# Patient Record
Sex: Female | Born: 1960 | Race: White | Hispanic: No | Marital: Married | Smoking: Current every day smoker
Health system: Southern US, Community
[De-identification: ages and names within clinical notes are randomized; demographics above are authoritative.]

## PROBLEM LIST (undated history)

## (undated) DIAGNOSIS — G629 Polyneuropathy, unspecified: Secondary | ICD-10-CM

## (undated) DIAGNOSIS — Z807 Family history of other malignant neoplasms of lymphoid, hematopoietic and related tissues: Secondary | ICD-10-CM

## (undated) DIAGNOSIS — I341 Nonrheumatic mitral (valve) prolapse: Secondary | ICD-10-CM

## (undated) DIAGNOSIS — F32A Depression, unspecified: Secondary | ICD-10-CM

## (undated) DIAGNOSIS — F419 Anxiety disorder, unspecified: Secondary | ICD-10-CM

## (undated) DIAGNOSIS — F329 Major depressive disorder, single episode, unspecified: Secondary | ICD-10-CM

## (undated) DIAGNOSIS — R569 Unspecified convulsions: Secondary | ICD-10-CM

## (undated) DIAGNOSIS — E782 Mixed hyperlipidemia: Secondary | ICD-10-CM

## (undated) DIAGNOSIS — M1990 Unspecified osteoarthritis, unspecified site: Secondary | ICD-10-CM

## (undated) DIAGNOSIS — I10 Essential (primary) hypertension: Secondary | ICD-10-CM

## (undated) DIAGNOSIS — Z808 Family history of malignant neoplasm of other organs or systems: Secondary | ICD-10-CM

## (undated) DIAGNOSIS — Z801 Family history of malignant neoplasm of trachea, bronchus and lung: Secondary | ICD-10-CM

## (undated) DIAGNOSIS — Z800 Family history of malignant neoplasm of digestive organs: Secondary | ICD-10-CM

## (undated) DIAGNOSIS — H18519 Endothelial corneal dystrophy, unspecified eye: Secondary | ICD-10-CM

## (undated) DIAGNOSIS — K219 Gastro-esophageal reflux disease without esophagitis: Secondary | ICD-10-CM

## (undated) DIAGNOSIS — R159 Full incontinence of feces: Secondary | ICD-10-CM

## (undated) DIAGNOSIS — C50919 Malignant neoplasm of unspecified site of unspecified female breast: Secondary | ICD-10-CM

## (undated) DIAGNOSIS — H1851 Endothelial corneal dystrophy: Secondary | ICD-10-CM

## (undated) DIAGNOSIS — A0472 Enterocolitis due to Clostridium difficile, not specified as recurrent: Secondary | ICD-10-CM

## (undated) DIAGNOSIS — G4733 Obstructive sleep apnea (adult) (pediatric): Secondary | ICD-10-CM

## (undated) HISTORY — DX: Obstructive sleep apnea (adult) (pediatric): G47.33

## (undated) HISTORY — DX: Endothelial corneal dystrophy: H18.51

## (undated) HISTORY — PX: KNEE ARTHROSCOPY: SUR90

## (undated) HISTORY — DX: Mixed hyperlipidemia: E78.2

## (undated) HISTORY — DX: Essential (primary) hypertension: I10

## (undated) HISTORY — DX: Family history of malignant neoplasm of trachea, bronchus and lung: Z80.1

## (undated) HISTORY — DX: Malignant neoplasm of unspecified site of unspecified female breast: C50.919

## (undated) HISTORY — PX: OTHER SURGICAL HISTORY: SHX169

## (undated) HISTORY — PX: CARDIAC CATHETERIZATION: SHX172

## (undated) HISTORY — DX: Endothelial corneal dystrophy, unspecified eye: H18.519

## (undated) HISTORY — DX: Family history of malignant neoplasm of other organs or systems: Z80.8

## (undated) HISTORY — DX: Family history of malignant neoplasm of digestive organs: Z80.0

## (undated) HISTORY — DX: Enterocolitis due to Clostridium difficile, not specified as recurrent: A04.72

## (undated) HISTORY — DX: Gastro-esophageal reflux disease without esophagitis: K21.9

## (undated) HISTORY — PX: MASTECTOMY: SHX3

## (undated) HISTORY — DX: Polyneuropathy, unspecified: G62.9

## (undated) HISTORY — DX: Family history of other malignant neoplasms of lymphoid, hematopoietic and related tissues: Z80.7

## (undated) HISTORY — DX: Depression, unspecified: F32.A

## (undated) HISTORY — DX: Major depressive disorder, single episode, unspecified: F32.9

## (undated) HISTORY — PX: TONSILLECTOMY: SUR1361

## (undated) HISTORY — DX: Full incontinence of feces: R15.9

## (undated) HISTORY — PX: OVARIAN CYST REMOVAL: SHX89

## (undated) HISTORY — PX: BACK SURGERY: SHX140

## (undated) HISTORY — DX: Nonrheumatic mitral (valve) prolapse: I34.1

## (undated) HISTORY — PX: ELBOW SURGERY: SHX618

---

## 1990-12-17 HISTORY — PX: OTHER SURGICAL HISTORY: SHX169

## 1995-12-18 HISTORY — PX: OTHER SURGICAL HISTORY: SHX169

## 1997-12-17 HISTORY — PX: ABDOMINAL HYSTERECTOMY: SHX81

## 1998-04-05 ENCOUNTER — Ambulatory Visit (HOSPITAL_COMMUNITY): Admission: RE | Admit: 1998-04-05 | Discharge: 1998-04-05 | Payer: Self-pay | Admitting: Obstetrics and Gynecology

## 1998-11-28 ENCOUNTER — Inpatient Hospital Stay (HOSPITAL_COMMUNITY): Admission: RE | Admit: 1998-11-28 | Discharge: 1998-11-30 | Payer: Self-pay | Admitting: Obstetrics and Gynecology

## 1999-05-02 ENCOUNTER — Other Ambulatory Visit: Admission: RE | Admit: 1999-05-02 | Discharge: 1999-05-02 | Payer: Self-pay | Admitting: Obstetrics and Gynecology

## 1999-11-23 ENCOUNTER — Ambulatory Visit (HOSPITAL_COMMUNITY)
Admission: RE | Admit: 1999-11-23 | Discharge: 1999-11-23 | Payer: Self-pay | Admitting: Physical Medicine and Rehabilitation

## 1999-11-23 ENCOUNTER — Encounter: Payer: Self-pay | Admitting: Physical Medicine and Rehabilitation

## 1999-12-15 ENCOUNTER — Encounter: Payer: Self-pay | Admitting: Physical Medicine and Rehabilitation

## 1999-12-15 ENCOUNTER — Ambulatory Visit (HOSPITAL_COMMUNITY)
Admission: RE | Admit: 1999-12-15 | Discharge: 1999-12-15 | Payer: Self-pay | Admitting: Physical Medicine and Rehabilitation

## 1999-12-29 ENCOUNTER — Ambulatory Visit (HOSPITAL_COMMUNITY)
Admission: RE | Admit: 1999-12-29 | Discharge: 1999-12-29 | Payer: Self-pay | Admitting: Physical Medicine and Rehabilitation

## 1999-12-29 ENCOUNTER — Encounter: Payer: Self-pay | Admitting: Physical Medicine and Rehabilitation

## 2000-02-23 ENCOUNTER — Ambulatory Visit (HOSPITAL_COMMUNITY): Admission: RE | Admit: 2000-02-23 | Discharge: 2000-02-23 | Payer: Self-pay | Admitting: Neurosurgery

## 2000-02-23 ENCOUNTER — Encounter: Payer: Self-pay | Admitting: Neurosurgery

## 2000-03-15 ENCOUNTER — Ambulatory Visit (HOSPITAL_COMMUNITY): Admission: RE | Admit: 2000-03-15 | Discharge: 2000-03-15 | Payer: Self-pay | Admitting: Neurosurgery

## 2000-03-15 ENCOUNTER — Encounter: Payer: Self-pay | Admitting: Neurosurgery

## 2000-05-02 ENCOUNTER — Other Ambulatory Visit: Admission: RE | Admit: 2000-05-02 | Discharge: 2000-05-02 | Payer: Self-pay | Admitting: Obstetrics and Gynecology

## 2000-05-24 ENCOUNTER — Ambulatory Visit (HOSPITAL_COMMUNITY): Admission: RE | Admit: 2000-05-24 | Discharge: 2000-05-24 | Payer: Self-pay | Admitting: Neurosurgery

## 2000-05-24 ENCOUNTER — Encounter: Payer: Self-pay | Admitting: Neurosurgery

## 2000-05-28 ENCOUNTER — Encounter: Payer: Self-pay | Admitting: Neurosurgery

## 2000-05-28 ENCOUNTER — Ambulatory Visit (HOSPITAL_COMMUNITY): Admission: RE | Admit: 2000-05-28 | Discharge: 2000-05-28 | Payer: Self-pay | Admitting: Neurosurgery

## 2000-07-17 ENCOUNTER — Encounter: Admission: RE | Admit: 2000-07-17 | Discharge: 2000-07-17 | Payer: Self-pay | Admitting: Obstetrics and Gynecology

## 2000-07-17 ENCOUNTER — Encounter: Payer: Self-pay | Admitting: Obstetrics and Gynecology

## 2000-10-25 ENCOUNTER — Ambulatory Visit (HOSPITAL_COMMUNITY): Admission: RE | Admit: 2000-10-25 | Discharge: 2000-10-25 | Payer: Self-pay | Admitting: Obstetrics and Gynecology

## 2001-09-22 ENCOUNTER — Other Ambulatory Visit: Admission: RE | Admit: 2001-09-22 | Discharge: 2001-09-22 | Payer: Self-pay | Admitting: Obstetrics and Gynecology

## 2001-09-24 ENCOUNTER — Encounter: Payer: Self-pay | Admitting: Obstetrics and Gynecology

## 2001-09-24 ENCOUNTER — Encounter: Admission: RE | Admit: 2001-09-24 | Discharge: 2001-09-24 | Payer: Self-pay | Admitting: Obstetrics and Gynecology

## 2002-07-22 ENCOUNTER — Encounter: Payer: Self-pay | Admitting: Internal Medicine

## 2002-07-22 ENCOUNTER — Inpatient Hospital Stay (HOSPITAL_COMMUNITY): Admission: EM | Admit: 2002-07-22 | Discharge: 2002-07-23 | Payer: Self-pay | Admitting: Internal Medicine

## 2002-09-29 ENCOUNTER — Other Ambulatory Visit: Admission: RE | Admit: 2002-09-29 | Discharge: 2002-09-29 | Payer: Self-pay | Admitting: Obstetrics and Gynecology

## 2002-10-16 ENCOUNTER — Other Ambulatory Visit: Admission: RE | Admit: 2002-10-16 | Discharge: 2002-10-16 | Payer: Self-pay | Admitting: Diagnostic Radiology

## 2002-10-16 ENCOUNTER — Encounter: Payer: Self-pay | Admitting: Obstetrics and Gynecology

## 2002-10-16 ENCOUNTER — Encounter: Admission: RE | Admit: 2002-10-16 | Discharge: 2002-10-16 | Payer: Self-pay | Admitting: Obstetrics and Gynecology

## 2002-10-16 ENCOUNTER — Encounter (INDEPENDENT_AMBULATORY_CARE_PROVIDER_SITE_OTHER): Payer: Self-pay | Admitting: Specialist

## 2002-10-22 ENCOUNTER — Encounter: Payer: Self-pay | Admitting: General Surgery

## 2002-10-22 ENCOUNTER — Ambulatory Visit (HOSPITAL_COMMUNITY): Admission: RE | Admit: 2002-10-22 | Discharge: 2002-10-22 | Payer: Self-pay | Admitting: General Surgery

## 2002-10-28 ENCOUNTER — Encounter: Payer: Self-pay | Admitting: General Surgery

## 2002-10-28 ENCOUNTER — Encounter: Admission: RE | Admit: 2002-10-28 | Discharge: 2002-10-28 | Payer: Self-pay | Admitting: General Surgery

## 2002-10-30 ENCOUNTER — Encounter (INDEPENDENT_AMBULATORY_CARE_PROVIDER_SITE_OTHER): Payer: Self-pay | Admitting: *Deleted

## 2002-10-30 ENCOUNTER — Ambulatory Visit (HOSPITAL_BASED_OUTPATIENT_CLINIC_OR_DEPARTMENT_OTHER): Admission: RE | Admit: 2002-10-30 | Discharge: 2002-10-30 | Payer: Self-pay | Admitting: General Surgery

## 2002-10-30 ENCOUNTER — Encounter: Payer: Self-pay | Admitting: General Surgery

## 2002-10-30 HISTORY — PX: SENTINEL LYMPH NODE BIOPSY: SHX2392

## 2002-10-30 HISTORY — PX: BREAST LUMPECTOMY: SHX2

## 2002-11-05 ENCOUNTER — Ambulatory Visit: Admission: RE | Admit: 2002-11-05 | Discharge: 2002-11-23 | Payer: Self-pay | Admitting: Radiation Oncology

## 2002-11-10 ENCOUNTER — Ambulatory Visit (HOSPITAL_COMMUNITY): Admission: RE | Admit: 2002-11-10 | Discharge: 2002-11-10 | Payer: Self-pay | Admitting: Oncology

## 2002-11-10 ENCOUNTER — Encounter: Payer: Self-pay | Admitting: Oncology

## 2002-11-13 ENCOUNTER — Encounter: Payer: Self-pay | Admitting: Cardiology

## 2002-11-13 ENCOUNTER — Ambulatory Visit: Admission: RE | Admit: 2002-11-13 | Discharge: 2002-11-13 | Payer: Self-pay | Admitting: Oncology

## 2002-11-18 ENCOUNTER — Encounter: Payer: Self-pay | Admitting: Oncology

## 2002-11-18 ENCOUNTER — Ambulatory Visit (HOSPITAL_COMMUNITY): Admission: RE | Admit: 2002-11-18 | Discharge: 2002-11-18 | Payer: Self-pay | Admitting: Oncology

## 2002-12-15 ENCOUNTER — Encounter: Payer: Self-pay | Admitting: General Surgery

## 2002-12-15 ENCOUNTER — Ambulatory Visit (HOSPITAL_BASED_OUTPATIENT_CLINIC_OR_DEPARTMENT_OTHER): Admission: RE | Admit: 2002-12-15 | Discharge: 2002-12-15 | Payer: Self-pay | Admitting: General Surgery

## 2003-02-22 ENCOUNTER — Ambulatory Visit: Admission: RE | Admit: 2003-02-22 | Discharge: 2003-02-22 | Payer: Self-pay | Admitting: Oncology

## 2003-04-13 ENCOUNTER — Encounter (INDEPENDENT_AMBULATORY_CARE_PROVIDER_SITE_OTHER): Payer: Self-pay | Admitting: Cardiology

## 2003-04-13 ENCOUNTER — Ambulatory Visit: Admission: RE | Admit: 2003-04-13 | Discharge: 2003-04-13 | Payer: Self-pay | Admitting: Oncology

## 2003-05-14 ENCOUNTER — Ambulatory Visit: Admission: RE | Admit: 2003-05-14 | Discharge: 2003-07-22 | Payer: Self-pay | Admitting: Radiation Oncology

## 2003-05-19 ENCOUNTER — Encounter: Admission: RE | Admit: 2003-05-19 | Discharge: 2003-05-19 | Payer: Self-pay | Admitting: Radiation Oncology

## 2003-05-25 ENCOUNTER — Encounter (INDEPENDENT_AMBULATORY_CARE_PROVIDER_SITE_OTHER): Payer: Self-pay | Admitting: Cardiology

## 2003-05-25 ENCOUNTER — Ambulatory Visit: Admission: RE | Admit: 2003-05-25 | Discharge: 2003-05-25 | Payer: Self-pay | Admitting: Oncology

## 2003-06-09 ENCOUNTER — Encounter: Payer: Self-pay | Admitting: Oncology

## 2003-06-09 ENCOUNTER — Ambulatory Visit (HOSPITAL_COMMUNITY): Admission: RE | Admit: 2003-06-09 | Discharge: 2003-06-09 | Payer: Self-pay | Admitting: Oncology

## 2003-08-26 ENCOUNTER — Ambulatory Visit: Admission: RE | Admit: 2003-08-26 | Discharge: 2003-08-26 | Payer: Self-pay | Admitting: Oncology

## 2003-08-26 ENCOUNTER — Encounter: Payer: Self-pay | Admitting: Cardiology

## 2003-10-05 ENCOUNTER — Other Ambulatory Visit: Admission: RE | Admit: 2003-10-05 | Discharge: 2003-10-05 | Payer: Self-pay | Admitting: Obstetrics and Gynecology

## 2003-12-13 ENCOUNTER — Encounter: Admission: RE | Admit: 2003-12-13 | Discharge: 2003-12-13 | Payer: Self-pay | Admitting: Oncology

## 2004-02-10 ENCOUNTER — Ambulatory Visit: Admission: RE | Admit: 2004-02-10 | Discharge: 2004-02-10 | Payer: Self-pay | Admitting: Radiation Oncology

## 2004-02-11 ENCOUNTER — Ambulatory Visit (HOSPITAL_COMMUNITY): Admission: RE | Admit: 2004-02-11 | Discharge: 2004-02-11 | Payer: Self-pay | Admitting: Oncology

## 2004-02-15 ENCOUNTER — Ambulatory Visit (HOSPITAL_COMMUNITY): Admission: RE | Admit: 2004-02-15 | Discharge: 2004-02-15 | Payer: Self-pay | Admitting: Oncology

## 2004-03-14 ENCOUNTER — Ambulatory Visit (HOSPITAL_COMMUNITY): Admission: RE | Admit: 2004-03-14 | Discharge: 2004-03-14 | Payer: Self-pay | Admitting: Oncology

## 2004-03-31 ENCOUNTER — Ambulatory Visit (HOSPITAL_BASED_OUTPATIENT_CLINIC_OR_DEPARTMENT_OTHER): Admission: RE | Admit: 2004-03-31 | Discharge: 2004-03-31 | Payer: Self-pay | Admitting: General Surgery

## 2004-05-22 ENCOUNTER — Encounter: Admission: RE | Admit: 2004-05-22 | Discharge: 2004-05-22 | Payer: Self-pay | Admitting: Oncology

## 2004-05-26 ENCOUNTER — Ambulatory Visit: Admission: RE | Admit: 2004-05-26 | Discharge: 2004-05-26 | Payer: Self-pay | Admitting: Oncology

## 2004-05-26 ENCOUNTER — Encounter: Payer: Self-pay | Admitting: Cardiology

## 2004-12-05 ENCOUNTER — Ambulatory Visit: Payer: Self-pay | Admitting: Oncology

## 2004-12-06 ENCOUNTER — Encounter: Admission: RE | Admit: 2004-12-06 | Discharge: 2004-12-06 | Payer: Self-pay | Admitting: Oncology

## 2005-02-27 ENCOUNTER — Ambulatory Visit: Payer: Self-pay | Admitting: Oncology

## 2005-05-23 ENCOUNTER — Ambulatory Visit: Payer: Self-pay | Admitting: Oncology

## 2005-06-29 ENCOUNTER — Ambulatory Visit: Payer: Self-pay | Admitting: Internal Medicine

## 2005-11-30 ENCOUNTER — Ambulatory Visit: Payer: Self-pay | Admitting: Oncology

## 2005-12-26 ENCOUNTER — Ambulatory Visit: Payer: Self-pay | Admitting: Oncology

## 2005-12-26 ENCOUNTER — Encounter: Admission: RE | Admit: 2005-12-26 | Discharge: 2005-12-26 | Payer: Self-pay | Admitting: Oncology

## 2006-05-29 ENCOUNTER — Ambulatory Visit: Admission: RE | Admit: 2006-05-29 | Discharge: 2006-05-29 | Payer: Self-pay | Admitting: Oncology

## 2006-05-29 ENCOUNTER — Encounter: Payer: Self-pay | Admitting: Cardiology

## 2006-05-29 ENCOUNTER — Ambulatory Visit: Payer: Self-pay | Admitting: Cardiology

## 2006-05-30 ENCOUNTER — Ambulatory Visit: Payer: Self-pay | Admitting: Oncology

## 2006-06-03 LAB — CBC WITH DIFFERENTIAL/PLATELET
BASO%: 0.6 % (ref 0.0–2.0)
Basophils Absolute: 0.1 10*3/uL (ref 0.0–0.1)
EOS%: 2.4 % (ref 0.0–7.0)
Eosinophils Absolute: 0.2 10*3/uL (ref 0.0–0.5)
HCT: 39.2 % (ref 34.8–46.6)
HGB: 13.6 g/dL (ref 11.6–15.9)
LYMPH%: 45.8 % (ref 14.0–48.0)
MCH: 31.1 pg (ref 26.0–34.0)
MCHC: 34.8 g/dL (ref 32.0–36.0)
MCV: 89.5 fL (ref 81.0–101.0)
MONO#: 0.5 10*3/uL (ref 0.1–0.9)
MONO%: 5.1 % (ref 0.0–13.0)
NEUT#: 4.1 10*3/uL (ref 1.5–6.5)
NEUT%: 46.1 % (ref 39.6–76.8)
Platelets: 305 10*3/uL (ref 145–400)
RBC: 4.38 10*6/uL (ref 3.70–5.32)
RDW: 12.5 % (ref 11.3–14.5)
WBC: 8.9 10*3/uL (ref 3.9–10.0)
lymph#: 4.1 10*3/uL — ABNORMAL HIGH (ref 0.9–3.3)

## 2006-06-03 LAB — LACTATE DEHYDROGENASE: LDH: 180 U/L (ref 94–250)

## 2006-06-03 LAB — COMPREHENSIVE METABOLIC PANEL
ALT: 13 U/L (ref 0–40)
AST: 31 U/L (ref 0–37)
Albumin: 4.8 g/dL (ref 3.5–5.2)
Alkaline Phosphatase: 72 U/L (ref 39–117)
BUN: 14 mg/dL (ref 6–23)
CO2: 25 mEq/L (ref 19–32)
Calcium: 9.4 mg/dL (ref 8.4–10.5)
Chloride: 103 mEq/L (ref 96–112)
Creatinine, Ser: 0.91 mg/dL (ref 0.40–1.20)
Glucose, Bld: 114 mg/dL — ABNORMAL HIGH (ref 70–99)
Potassium: 3.8 mEq/L (ref 3.5–5.3)
Sodium: 141 mEq/L (ref 135–145)
Total Bilirubin: 0.7 mg/dL (ref 0.3–1.2)
Total Protein: 7.1 g/dL (ref 6.0–8.3)

## 2006-06-03 LAB — CANCER ANTIGEN 27.29: CA 27.29: 27 U/mL (ref 0–39)

## 2006-06-03 LAB — RESEARCH LABS

## 2006-11-21 ENCOUNTER — Ambulatory Visit: Payer: Self-pay | Admitting: Oncology

## 2006-11-25 LAB — CBC WITH DIFFERENTIAL/PLATELET
BASO%: 0.7 % (ref 0.0–2.0)
Basophils Absolute: 0.1 10*3/uL (ref 0.0–0.1)
EOS%: 2.9 % (ref 0.0–7.0)
Eosinophils Absolute: 0.2 10*3/uL (ref 0.0–0.5)
HCT: 38.3 % (ref 34.8–46.6)
HGB: 13.3 g/dL (ref 11.6–15.9)
LYMPH%: 39.3 % (ref 14.0–48.0)
MCH: 31.1 pg (ref 26.0–34.0)
MCHC: 34.7 g/dL (ref 32.0–36.0)
MCV: 89.7 fL (ref 81.0–101.0)
MONO#: 0.4 10*3/uL (ref 0.1–0.9)
MONO%: 4.6 % (ref 0.0–13.0)
NEUT#: 4.1 10*3/uL (ref 1.5–6.5)
NEUT%: 52.5 % (ref 39.6–76.8)
Platelets: 324 10*3/uL (ref 145–400)
RBC: 4.28 10*6/uL (ref 3.70–5.32)
RDW: 12.5 % (ref 11.3–14.5)
WBC: 7.8 10*3/uL (ref 3.9–10.0)
lymph#: 3 10*3/uL (ref 0.9–3.3)

## 2006-11-25 LAB — COMPREHENSIVE METABOLIC PANEL
ALT: 12 U/L (ref 0–35)
AST: 18 U/L (ref 0–37)
Albumin: 4.4 g/dL (ref 3.5–5.2)
Alkaline Phosphatase: 72 U/L (ref 39–117)
BUN: 18 mg/dL (ref 6–23)
CO2: 27 mEq/L (ref 19–32)
Calcium: 9.5 mg/dL (ref 8.4–10.5)
Chloride: 105 mEq/L (ref 96–112)
Creatinine, Ser: 0.76 mg/dL (ref 0.40–1.20)
Glucose, Bld: 124 mg/dL — ABNORMAL HIGH (ref 70–99)
Potassium: 3.8 mEq/L (ref 3.5–5.3)
Sodium: 141 mEq/L (ref 135–145)
Total Bilirubin: 0.6 mg/dL (ref 0.3–1.2)
Total Protein: 7.1 g/dL (ref 6.0–8.3)

## 2006-11-25 LAB — LACTATE DEHYDROGENASE: LDH: 143 U/L (ref 94–250)

## 2006-11-25 LAB — CANCER ANTIGEN 27.29: CA 27.29: 22 U/mL (ref 0–39)

## 2006-11-25 LAB — RESEARCH LABS

## 2007-01-10 ENCOUNTER — Encounter: Admission: RE | Admit: 2007-01-10 | Discharge: 2007-01-10 | Payer: Self-pay | Admitting: Oncology

## 2007-04-01 ENCOUNTER — Emergency Department (HOSPITAL_COMMUNITY): Admission: EM | Admit: 2007-04-01 | Discharge: 2007-04-01 | Payer: Self-pay | Admitting: Emergency Medicine

## 2007-04-03 ENCOUNTER — Ambulatory Visit: Payer: Self-pay | Admitting: Orthopedic Surgery

## 2007-04-17 ENCOUNTER — Ambulatory Visit: Payer: Self-pay | Admitting: Orthopedic Surgery

## 2007-04-21 ENCOUNTER — Ambulatory Visit (HOSPITAL_COMMUNITY): Admission: RE | Admit: 2007-04-21 | Discharge: 2007-04-21 | Payer: Self-pay | Admitting: Orthopedic Surgery

## 2007-04-28 ENCOUNTER — Ambulatory Visit: Payer: Self-pay | Admitting: Orthopedic Surgery

## 2007-04-30 ENCOUNTER — Encounter: Payer: Self-pay | Admitting: Orthopedic Surgery

## 2007-05-01 ENCOUNTER — Encounter (HOSPITAL_COMMUNITY): Admission: RE | Admit: 2007-05-01 | Discharge: 2007-05-31 | Payer: Self-pay | Admitting: Orthopedic Surgery

## 2007-05-21 ENCOUNTER — Ambulatory Visit: Payer: Self-pay | Admitting: Oncology

## 2007-06-02 ENCOUNTER — Encounter (HOSPITAL_COMMUNITY): Admission: RE | Admit: 2007-06-02 | Discharge: 2007-07-02 | Payer: Self-pay | Admitting: Orthopedic Surgery

## 2007-06-30 ENCOUNTER — Ambulatory Visit: Payer: Self-pay | Admitting: Cardiovascular Disease

## 2007-06-30 ENCOUNTER — Inpatient Hospital Stay (HOSPITAL_COMMUNITY): Admission: RE | Admit: 2007-06-30 | Discharge: 2007-07-02 | Payer: Self-pay | Admitting: Neurosurgery

## 2007-07-23 ENCOUNTER — Ambulatory Visit: Payer: Self-pay | Admitting: Oncology

## 2007-07-25 LAB — COMPREHENSIVE METABOLIC PANEL
ALT: 9 U/L (ref 0–35)
AST: 16 U/L (ref 0–37)
Albumin: 4 g/dL (ref 3.5–5.2)
Alkaline Phosphatase: 79 U/L (ref 39–117)
BUN: 13 mg/dL (ref 6–23)
CO2: 24 mEq/L (ref 19–32)
Calcium: 9.8 mg/dL (ref 8.4–10.5)
Chloride: 105 mEq/L (ref 96–112)
Creatinine, Ser: 0.81 mg/dL (ref 0.40–1.20)
Glucose, Bld: 78 mg/dL (ref 70–99)
Potassium: 4.4 mEq/L (ref 3.5–5.3)
Sodium: 140 mEq/L (ref 135–145)
Total Bilirubin: 0.6 mg/dL (ref 0.3–1.2)
Total Protein: 7 g/dL (ref 6.0–8.3)

## 2007-07-25 LAB — CBC WITH DIFFERENTIAL/PLATELET
BASO%: 0.5 % (ref 0.0–2.0)
Basophils Absolute: 0 10*3/uL (ref 0.0–0.1)
EOS%: 4.3 % (ref 0.0–7.0)
Eosinophils Absolute: 0.4 10*3/uL (ref 0.0–0.5)
HCT: 36.9 % (ref 34.8–46.6)
HGB: 13.2 g/dL (ref 11.6–15.9)
LYMPH%: 33.6 % (ref 14.0–48.0)
MCH: 31.6 pg (ref 26.0–34.0)
MCHC: 35.7 g/dL (ref 32.0–36.0)
MCV: 88.7 fL (ref 81.0–101.0)
MONO#: 0.7 10*3/uL (ref 0.1–0.9)
MONO%: 7.5 % (ref 0.0–13.0)
NEUT#: 5 10*3/uL (ref 1.5–6.5)
NEUT%: 54.1 % (ref 39.6–76.8)
Platelets: 359 10*3/uL (ref 145–400)
RBC: 4.16 10*6/uL (ref 3.70–5.32)
RDW: 12.3 % (ref 11.3–14.5)
WBC: 9.2 10*3/uL (ref 3.9–10.0)
lymph#: 3.1 10*3/uL (ref 0.9–3.3)

## 2007-07-25 LAB — RESEARCH LABS

## 2007-07-25 LAB — CANCER ANTIGEN 27.29: CA 27.29: 24 U/mL (ref 0–39)

## 2007-07-25 LAB — LACTATE DEHYDROGENASE: LDH: 137 U/L (ref 94–250)

## 2007-10-22 ENCOUNTER — Encounter: Payer: Self-pay | Admitting: Orthopedic Surgery

## 2007-11-27 ENCOUNTER — Ambulatory Visit: Payer: Self-pay | Admitting: Oncology

## 2007-12-30 ENCOUNTER — Encounter: Payer: Self-pay | Admitting: Orthopedic Surgery

## 2008-01-13 ENCOUNTER — Ambulatory Visit: Payer: Self-pay | Admitting: Oncology

## 2008-02-13 ENCOUNTER — Encounter: Admission: RE | Admit: 2008-02-13 | Discharge: 2008-02-13 | Payer: Self-pay | Admitting: Oncology

## 2008-02-13 LAB — CBC WITH DIFFERENTIAL/PLATELET
BASO%: 0.7 % (ref 0.0–2.0)
Basophils Absolute: 0.1 10*3/uL (ref 0.0–0.1)
EOS%: 2 % (ref 0.0–7.0)
Eosinophils Absolute: 0.2 10*3/uL (ref 0.0–0.5)
HCT: 41.3 % (ref 34.8–46.6)
HGB: 14.6 g/dL (ref 11.6–15.9)
LYMPH%: 38.5 % (ref 14.0–48.0)
MCH: 31.1 pg (ref 26.0–34.0)
MCHC: 35.4 g/dL (ref 32.0–36.0)
MCV: 88 fL (ref 81.0–101.0)
MONO#: 0.5 10*3/uL (ref 0.1–0.9)
MONO%: 4.5 % (ref 0.0–13.0)
NEUT#: 5.7 10*3/uL (ref 1.5–6.5)
NEUT%: 54.3 % (ref 39.6–76.8)
Platelets: 311 10*3/uL (ref 145–400)
RBC: 4.69 10*6/uL (ref 3.70–5.32)
RDW: 12.6 % (ref 11.3–14.5)
WBC: 10.4 10*3/uL — ABNORMAL HIGH (ref 3.9–10.0)
lymph#: 4 10*3/uL — ABNORMAL HIGH (ref 0.9–3.3)

## 2008-02-13 LAB — RESEARCH LABS

## 2008-02-16 LAB — COMPREHENSIVE METABOLIC PANEL
ALT: 12 U/L (ref 0–35)
AST: 23 U/L (ref 0–37)
Albumin: 4.9 g/dL (ref 3.5–5.2)
Alkaline Phosphatase: 76 U/L (ref 39–117)
BUN: 19 mg/dL (ref 6–23)
CO2: 23 mEq/L (ref 19–32)
Calcium: 9.6 mg/dL (ref 8.4–10.5)
Chloride: 99 mEq/L (ref 96–112)
Creatinine, Ser: 0.8 mg/dL (ref 0.40–1.20)
Glucose, Bld: 84 mg/dL (ref 70–99)
Potassium: 4 mEq/L (ref 3.5–5.3)
Sodium: 137 mEq/L (ref 135–145)
Total Bilirubin: 0.9 mg/dL (ref 0.3–1.2)
Total Protein: 7.7 g/dL (ref 6.0–8.3)

## 2008-02-16 LAB — LACTATE DEHYDROGENASE: LDH: 163 U/L (ref 94–250)

## 2008-02-16 LAB — VITAMIN D 25 HYDROXY (VIT D DEFICIENCY, FRACTURES): Vit D, 25-Hydroxy: 40 ng/mL (ref 30–89)

## 2008-02-16 LAB — CANCER ANTIGEN 27.29: CA 27.29: 36 U/mL (ref 0–39)

## 2008-02-18 LAB — VITAMIN D 1,25 DIHYDROXY: Vit D, 1,25-Dihydroxy: 24 pg/mL (ref 6–62)

## 2008-04-20 ENCOUNTER — Encounter: Payer: Self-pay | Admitting: Orthopedic Surgery

## 2008-06-02 ENCOUNTER — Ambulatory Visit: Payer: Self-pay | Admitting: Oncology

## 2008-06-04 ENCOUNTER — Ambulatory Visit: Payer: Self-pay

## 2008-06-04 ENCOUNTER — Encounter: Payer: Self-pay | Admitting: Oncology

## 2008-06-04 LAB — CBC WITH DIFFERENTIAL/PLATELET
BASO%: 0.8 % (ref 0.0–2.0)
Basophils Absolute: 0.1 10*3/uL (ref 0.0–0.1)
EOS%: 2.7 % (ref 0.0–7.0)
Eosinophils Absolute: 0.2 10*3/uL (ref 0.0–0.5)
HCT: 38.6 % (ref 34.8–46.6)
HGB: 13.5 g/dL (ref 11.6–15.9)
LYMPH%: 37.4 % (ref 14.0–48.0)
MCH: 31.1 pg (ref 26.0–34.0)
MCHC: 35.1 g/dL (ref 32.0–36.0)
MCV: 88.6 fL (ref 81.0–101.0)
MONO#: 0.6 10*3/uL (ref 0.1–0.9)
MONO%: 7.2 % (ref 0.0–13.0)
NEUT#: 4.2 10*3/uL (ref 1.5–6.5)
NEUT%: 51.9 % (ref 39.6–76.8)
Platelets: 323 10*3/uL (ref 145–400)
RBC: 4.36 10*6/uL (ref 3.70–5.32)
RDW: 12.4 % (ref 11.3–14.5)
WBC: 8 10*3/uL (ref 3.9–10.0)
lymph#: 3 10*3/uL (ref 0.9–3.3)

## 2008-06-04 LAB — COMPREHENSIVE METABOLIC PANEL
ALT: 14 U/L (ref 0–35)
AST: 21 U/L (ref 0–37)
Albumin: 4.7 g/dL (ref 3.5–5.2)
Alkaline Phosphatase: 67 U/L (ref 39–117)
BUN: 15 mg/dL (ref 6–23)
CO2: 24 mEq/L (ref 19–32)
Calcium: 9.9 mg/dL (ref 8.4–10.5)
Chloride: 106 mEq/L (ref 96–112)
Creatinine, Ser: 0.83 mg/dL (ref 0.40–1.20)
Glucose, Bld: 100 mg/dL — ABNORMAL HIGH (ref 70–99)
Potassium: 4.4 mEq/L (ref 3.5–5.3)
Sodium: 141 mEq/L (ref 135–145)
Total Bilirubin: 0.7 mg/dL (ref 0.3–1.2)
Total Protein: 7.3 g/dL (ref 6.0–8.3)

## 2008-06-04 LAB — CANCER ANTIGEN 27.29: CA 27.29: 37 U/mL (ref 0–39)

## 2008-06-04 LAB — RESEARCH LABS

## 2008-06-04 LAB — LACTATE DEHYDROGENASE: LDH: 154 U/L (ref 94–250)

## 2008-07-02 ENCOUNTER — Encounter: Payer: Self-pay | Admitting: Orthopedic Surgery

## 2008-07-09 ENCOUNTER — Encounter: Admission: RE | Admit: 2008-07-09 | Discharge: 2008-07-09 | Payer: Self-pay | Admitting: Neurosurgery

## 2008-07-21 ENCOUNTER — Encounter: Payer: Self-pay | Admitting: Orthopedic Surgery

## 2008-08-26 ENCOUNTER — Observation Stay (HOSPITAL_COMMUNITY): Admission: RE | Admit: 2008-08-26 | Discharge: 2008-08-27 | Payer: Self-pay | Admitting: Neurosurgery

## 2008-08-27 ENCOUNTER — Emergency Department (HOSPITAL_COMMUNITY): Admission: EM | Admit: 2008-08-27 | Discharge: 2008-08-28 | Payer: Self-pay | Admitting: Emergency Medicine

## 2008-09-16 ENCOUNTER — Encounter: Payer: Self-pay | Admitting: Orthopedic Surgery

## 2008-11-19 ENCOUNTER — Encounter: Payer: Self-pay | Admitting: Orthopedic Surgery

## 2008-12-02 ENCOUNTER — Ambulatory Visit: Payer: Self-pay | Admitting: Oncology

## 2008-12-06 LAB — CBC WITH DIFFERENTIAL/PLATELET
BASO%: 0.2 % (ref 0.0–2.0)
Basophils Absolute: 0 10*3/uL (ref 0.0–0.1)
EOS%: 2.9 % (ref 0.0–7.0)
Eosinophils Absolute: 0.3 10*3/uL (ref 0.0–0.5)
HCT: 40.7 % (ref 34.8–46.6)
HGB: 14.1 g/dL (ref 11.6–15.9)
LYMPH%: 37.8 % (ref 14.0–48.0)
MCH: 31 pg (ref 26.0–34.0)
MCHC: 34.6 g/dL (ref 32.0–36.0)
MCV: 89.8 fL (ref 81.0–101.0)
MONO#: 0.3 10*3/uL (ref 0.1–0.9)
MONO%: 3.1 % (ref 0.0–13.0)
NEUT#: 5.4 10*3/uL (ref 1.5–6.5)
NEUT%: 56 % (ref 39.6–76.8)
Platelets: 311 10*3/uL (ref 145–400)
RBC: 4.53 10*6/uL (ref 3.70–5.32)
RDW: 13 % (ref 11.3–14.5)
WBC: 9.6 10*3/uL (ref 3.9–10.0)
lymph#: 3.6 10*3/uL — ABNORMAL HIGH (ref 0.9–3.3)

## 2008-12-07 LAB — LACTATE DEHYDROGENASE: LDH: 162 U/L (ref 94–250)

## 2008-12-07 LAB — COMPREHENSIVE METABOLIC PANEL
ALT: 14 U/L (ref 0–35)
AST: 19 U/L (ref 0–37)
Albumin: 4.6 g/dL (ref 3.5–5.2)
Alkaline Phosphatase: 82 U/L (ref 39–117)
BUN: 18 mg/dL (ref 6–23)
CO2: 25 mEq/L (ref 19–32)
Calcium: 9.6 mg/dL (ref 8.4–10.5)
Chloride: 104 mEq/L (ref 96–112)
Creatinine, Ser: 0.73 mg/dL (ref 0.40–1.20)
Glucose, Bld: 120 mg/dL — ABNORMAL HIGH (ref 70–99)
Potassium: 4.4 mEq/L (ref 3.5–5.3)
Sodium: 143 mEq/L (ref 135–145)
Total Bilirubin: 0.6 mg/dL (ref 0.3–1.2)
Total Protein: 7.5 g/dL (ref 6.0–8.3)

## 2008-12-07 LAB — CANCER ANTIGEN 27.29: CA 27.29: 40 U/mL — ABNORMAL HIGH (ref 0–39)

## 2009-01-08 ENCOUNTER — Emergency Department (HOSPITAL_COMMUNITY): Admission: EM | Admit: 2009-01-08 | Discharge: 2009-01-08 | Payer: Self-pay | Admitting: Emergency Medicine

## 2009-01-08 ENCOUNTER — Encounter: Payer: Self-pay | Admitting: Orthopedic Surgery

## 2009-01-20 ENCOUNTER — Ambulatory Visit: Payer: Self-pay | Admitting: Orthopedic Surgery

## 2009-01-20 DIAGNOSIS — M25569 Pain in unspecified knee: Secondary | ICD-10-CM | POA: Insufficient documentation

## 2009-01-20 DIAGNOSIS — M23302 Other meniscus derangements, unspecified lateral meniscus, unspecified knee: Secondary | ICD-10-CM | POA: Insufficient documentation

## 2009-01-20 DIAGNOSIS — M25469 Effusion, unspecified knee: Secondary | ICD-10-CM | POA: Insufficient documentation

## 2009-01-20 DIAGNOSIS — M1710 Unilateral primary osteoarthritis, unspecified knee: Secondary | ICD-10-CM

## 2009-01-20 DIAGNOSIS — IMO0002 Reserved for concepts with insufficient information to code with codable children: Secondary | ICD-10-CM | POA: Insufficient documentation

## 2009-01-28 ENCOUNTER — Telehealth: Payer: Self-pay | Admitting: Orthopedic Surgery

## 2009-02-02 ENCOUNTER — Ambulatory Visit (HOSPITAL_COMMUNITY): Admission: RE | Admit: 2009-02-02 | Discharge: 2009-02-02 | Payer: Self-pay | Admitting: Orthopedic Surgery

## 2009-02-09 ENCOUNTER — Ambulatory Visit: Payer: Self-pay | Admitting: Orthopedic Surgery

## 2009-02-09 ENCOUNTER — Ambulatory Visit: Payer: Self-pay | Admitting: Oncology

## 2009-02-11 LAB — CANCER ANTIGEN 27.29: CA 27.29: 29 U/mL (ref 0–39)

## 2009-02-14 ENCOUNTER — Encounter: Admission: RE | Admit: 2009-02-14 | Discharge: 2009-02-14 | Payer: Self-pay | Admitting: Oncology

## 2009-02-16 ENCOUNTER — Ambulatory Visit (HOSPITAL_COMMUNITY): Admission: RE | Admit: 2009-02-16 | Discharge: 2009-02-16 | Payer: Self-pay | Admitting: Oncology

## 2009-02-17 ENCOUNTER — Encounter: Admission: RE | Admit: 2009-02-17 | Discharge: 2009-02-17 | Payer: Self-pay | Admitting: Oncology

## 2009-02-18 ENCOUNTER — Encounter: Payer: Self-pay | Admitting: Orthopedic Surgery

## 2009-05-13 ENCOUNTER — Ambulatory Visit: Payer: Self-pay | Admitting: Oncology

## 2009-05-18 LAB — CBC WITH DIFFERENTIAL/PLATELET
BASO%: 0.5 % (ref 0.0–2.0)
Basophils Absolute: 0.1 10*3/uL (ref 0.0–0.1)
EOS%: 2.2 % (ref 0.0–7.0)
Eosinophils Absolute: 0.2 10*3/uL (ref 0.0–0.5)
HCT: 40.8 % (ref 34.8–46.6)
HGB: 14.2 g/dL (ref 11.6–15.9)
LYMPH%: 31.2 % (ref 14.0–49.7)
MCH: 31.6 pg (ref 25.1–34.0)
MCHC: 34.9 g/dL (ref 31.5–36.0)
MCV: 90.5 fL (ref 79.5–101.0)
MONO#: 0.5 10*3/uL (ref 0.1–0.9)
MONO%: 5.1 % (ref 0.0–14.0)
NEUT#: 6.4 10*3/uL (ref 1.5–6.5)
NEUT%: 61 % (ref 38.4–76.8)
Platelets: 303 10*3/uL (ref 145–400)
RBC: 4.51 10*6/uL (ref 3.70–5.45)
RDW: 12.8 % (ref 11.2–14.5)
WBC: 10.5 10*3/uL — ABNORMAL HIGH (ref 3.9–10.3)
lymph#: 3.3 10*3/uL (ref 0.9–3.3)

## 2009-05-19 LAB — COMPREHENSIVE METABOLIC PANEL
ALT: 10 U/L (ref 0–35)
AST: 18 U/L (ref 0–37)
Albumin: 4.6 g/dL (ref 3.5–5.2)
Alkaline Phosphatase: 77 U/L (ref 39–117)
BUN: 13 mg/dL (ref 6–23)
CO2: 25 mEq/L (ref 19–32)
Calcium: 10 mg/dL (ref 8.4–10.5)
Chloride: 105 mEq/L (ref 96–112)
Creatinine, Ser: 0.81 mg/dL (ref 0.40–1.20)
Glucose, Bld: 97 mg/dL (ref 70–99)
Potassium: 4.2 mEq/L (ref 3.5–5.3)
Sodium: 140 mEq/L (ref 135–145)
Total Bilirubin: 0.6 mg/dL (ref 0.3–1.2)
Total Protein: 7.7 g/dL (ref 6.0–8.3)

## 2009-05-19 LAB — CANCER ANTIGEN 27.29: CA 27.29: 37 U/mL (ref 0–39)

## 2009-05-19 LAB — VITAMIN D 25 HYDROXY (VIT D DEFICIENCY, FRACTURES): Vit D, 25-Hydroxy: 34 ng/mL (ref 30–89)

## 2009-05-19 LAB — LACTATE DEHYDROGENASE: LDH: 156 U/L (ref 94–250)

## 2009-08-19 ENCOUNTER — Ambulatory Visit (HOSPITAL_COMMUNITY): Admission: RE | Admit: 2009-08-19 | Discharge: 2009-08-19 | Payer: Self-pay | Admitting: Oncology

## 2010-02-15 ENCOUNTER — Encounter: Admission: RE | Admit: 2010-02-15 | Discharge: 2010-02-15 | Payer: Self-pay | Admitting: Oncology

## 2010-05-17 ENCOUNTER — Ambulatory Visit: Payer: Self-pay | Admitting: Oncology

## 2010-05-22 LAB — CBC WITH DIFFERENTIAL/PLATELET
BASO%: 0.5 % (ref 0.0–2.0)
Basophils Absolute: 0 10*3/uL (ref 0.0–0.1)
EOS%: 1.8 % (ref 0.0–7.0)
Eosinophils Absolute: 0.1 10*3/uL (ref 0.0–0.5)
HCT: 39.7 % (ref 34.8–46.6)
HGB: 13.8 g/dL (ref 11.6–15.9)
LYMPH%: 42.1 % (ref 14.0–49.7)
MCH: 31.7 pg (ref 25.1–34.0)
MCHC: 34.8 g/dL (ref 31.5–36.0)
MCV: 90.9 fL (ref 79.5–101.0)
MONO#: 0.4 10*3/uL (ref 0.1–0.9)
MONO%: 4.7 % (ref 0.0–14.0)
NEUT#: 3.8 10*3/uL (ref 1.5–6.5)
NEUT%: 50.9 % (ref 38.4–76.8)
Platelets: 258 10*3/uL (ref 145–400)
RBC: 4.36 10*6/uL (ref 3.70–5.45)
RDW: 12.8 % (ref 11.2–14.5)
WBC: 7.5 10*3/uL (ref 3.9–10.3)
lymph#: 3.1 10*3/uL (ref 0.9–3.3)

## 2010-05-23 LAB — COMPREHENSIVE METABOLIC PANEL
ALT: 12 U/L (ref 0–35)
AST: 20 U/L (ref 0–37)
Albumin: 4.9 g/dL (ref 3.5–5.2)
Alkaline Phosphatase: 74 U/L (ref 39–117)
BUN: 19 mg/dL (ref 6–23)
CO2: 22 mEq/L (ref 19–32)
Calcium: 9.9 mg/dL (ref 8.4–10.5)
Chloride: 105 mEq/L (ref 96–112)
Creatinine, Ser: 0.83 mg/dL (ref 0.40–1.20)
Glucose, Bld: 104 mg/dL — ABNORMAL HIGH (ref 70–99)
Potassium: 4.3 mEq/L (ref 3.5–5.3)
Sodium: 140 mEq/L (ref 135–145)
Total Bilirubin: 0.7 mg/dL (ref 0.3–1.2)
Total Protein: 7.4 g/dL (ref 6.0–8.3)

## 2010-05-23 LAB — LACTATE DEHYDROGENASE: LDH: 151 U/L (ref 94–250)

## 2010-05-23 LAB — CANCER ANTIGEN 27.29: CA 27.29: 30 U/mL (ref 0–39)

## 2010-05-23 LAB — VITAMIN D 25 HYDROXY (VIT D DEFICIENCY, FRACTURES): Vit D, 25-Hydroxy: 45 ng/mL (ref 30–89)

## 2010-07-20 ENCOUNTER — Ambulatory Visit: Payer: Self-pay | Admitting: Orthopedic Surgery

## 2010-07-20 DIAGNOSIS — M7710 Lateral epicondylitis, unspecified elbow: Secondary | ICD-10-CM | POA: Insufficient documentation

## 2010-07-24 ENCOUNTER — Encounter: Payer: Self-pay | Admitting: Orthopedic Surgery

## 2010-07-27 ENCOUNTER — Encounter: Payer: Self-pay | Admitting: Orthopedic Surgery

## 2010-07-27 ENCOUNTER — Encounter (HOSPITAL_COMMUNITY): Admission: RE | Admit: 2010-07-27 | Discharge: 2010-08-26 | Payer: Self-pay | Admitting: Orthopedic Surgery

## 2010-07-31 ENCOUNTER — Encounter
Admission: RE | Admit: 2010-07-31 | Discharge: 2010-09-04 | Payer: Self-pay | Source: Home / Self Care | Admitting: Orthopedic Surgery

## 2010-08-08 ENCOUNTER — Encounter: Payer: Self-pay | Admitting: Orthopedic Surgery

## 2010-09-11 ENCOUNTER — Ambulatory Visit: Payer: Self-pay | Admitting: Orthopedic Surgery

## 2010-09-14 ENCOUNTER — Encounter: Payer: Self-pay | Admitting: Orthopedic Surgery

## 2010-10-25 ENCOUNTER — Ambulatory Visit: Payer: Self-pay | Admitting: Orthopedic Surgery

## 2010-12-27 ENCOUNTER — Ambulatory Visit
Admission: RE | Admit: 2010-12-27 | Discharge: 2010-12-27 | Payer: Self-pay | Source: Home / Self Care | Attending: Orthopedic Surgery | Admitting: Orthopedic Surgery

## 2011-01-06 ENCOUNTER — Other Ambulatory Visit: Payer: Self-pay | Admitting: Oncology

## 2011-01-06 DIAGNOSIS — Z1239 Encounter for other screening for malignant neoplasm of breast: Secondary | ICD-10-CM

## 2011-01-07 ENCOUNTER — Encounter: Payer: Self-pay | Admitting: Oncology

## 2011-01-10 ENCOUNTER — Ambulatory Visit
Admission: RE | Admit: 2011-01-10 | Discharge: 2011-01-10 | Payer: Self-pay | Source: Home / Self Care | Attending: Orthopedic Surgery | Admitting: Orthopedic Surgery

## 2011-01-10 ENCOUNTER — Encounter: Payer: Self-pay | Admitting: Orthopedic Surgery

## 2011-01-15 ENCOUNTER — Telehealth: Payer: Self-pay | Admitting: Orthopedic Surgery

## 2011-01-18 NOTE — Progress Notes (Signed)
Summary: Office Visit 04/30/07  Office Visit 04/30/07   Imported By: Cammie Sickle 01/20/2009 09:06:06  _____________________________________________________________________  External Attachment:    Type:   Image     Comment:   External Document

## 2011-01-18 NOTE — Miscellaneous (Addendum)
Summary: PT Discharge summary  PT Discharage summary   Imported By: Jacklynn Ganong 09/14/2010 16:23:25  _____________________________________________________________________  External Attachment:    Type:   Image     Comment:   External Document

## 2011-01-18 NOTE — Letter (Signed)
Summary: Work [facility]  [address]  [place], [place] [address]   Phone: [phone]  Fax: [phone]    Today's Date: January 10, 2011  Name of Patient: Rachel Sutton [facility]  The above named patient had a medical visit today.  Please take this into consideration when reviewing the time away from work   Special Instructions:     [ X ] To be off  next  for scheduled appointments as follows:         Pre-op  01/16/11   [ X ] Other          (secondary to surgery scheduled 01/22/11)           Estimated return to work date 02/22/11 or until further notice ________________________________________________________________________   Sincerely,   Terrance Mass, MD

## 2011-01-18 NOTE — Consult Note (Signed)
Summary: Consult Report [facility]  Consult Report [facility]   Imported By: Cammie Sickle 07/29/2008 10:09:12  _____________________________________________________________________  External Attachment:    Type:   Image     Comment:   External Document

## 2011-01-18 NOTE — Assessment & Plan Note (Signed)
Summary: AP ER FOL/UP/LT KNEE PAIN,SWELLING/XR AP/BCBS/CAF    History of Present Illness: I saw Rachel Sutton in the office today for a followup visit.  She is a 50 years old woman with the complaint of:  NEW PROBLEM. LEFT KNEE PAIN.  ER on 01-08-09 for left knee pain and swelling. Xrays were negative for fracture, small effusion.  Patient states no injury. She says that her knee starting hurting that morning. She says that she has had swelling on and off for 6 months. She has been doing the Wii fit.  musculoskeletal review of systems otherwise negative    Updated Prior Medication List: No Medications Current Allergies (reviewed today): ! * [facility] ! HALDOL  Past Medical History:    Reviewed history from 01/20/2009 and no changes required:       None  Past Surgical History:    Lumpectomy    Carpal Tunnel R/L    Ray Cage fusion    a cervical spine fusion x2 since last visit   Family History:    noncontributory  Social History:    Patient is married.     Review of Systems  The review of systems is negative for General, Cardiac , Resp, and GI.   Physical Exam  Constitutional: vital signs see recorded values. General: normal development, nutrition, and grooming. No deformity. Body Habitus is medium. CDV: Observation and palpation was normal  Lymph: palpation of the lymph nodes were normal Skin: inspection and palpation of the skin revealed no abnormalities  Neuro: coordination: normal              DTR's deferred             Sensation was normal  Psyche: Alert and oriented x 3. Mood was normal.  Affect: normal  MSK: Gait: abnormal gait pattern using crutches partial and nonweightbearing  LEFT knee large joint effusion.  0-115 range of motion, alignment normal, motor exam normal, ligament exam normal, medial joint line tenderness.  I cannot perform the McMurray's maneuver and a normal reliable manner due to  pain and swelling  RIGHT knee examination revealed normal  range of motion strength stability and alignment   The upper extremities have normal appearance, ROM, strength and stability.      Impression & Recommendations:  Problem # 1:  KNEE PAIN (LKG-401.02) Assessment: Deteriorated The x-rays were done at [facility]. The report and the films have been reviewed. Mild arthritis in the knee  Aspiration injection LEFT knee 70 cc of clear yellow fluid aspirated LEFT knee Verbal consent was obtained. The knee was prepped with alcohol and ethyl chloride. 1 cc of depomedrol 40mg /cc and 4 cc of lidocaine 1% was injected. there were no complications.  Orders: Est. Patient Level III (72536) Depo- Medrol 40mg  (J1030) Joint Aspirate / Injection, Large (20610)   Problem # 2:  JOINT EFFUSION, LEFT KNEE (ICD-719.06) Assessment: Deteriorated  Orders: Est. Patient Level III (64403) Depo- Medrol 40mg  (J1030) Joint Aspirate / Injection, Large (20610)   Problem # 3:  KNEE, ARTHRITIS, DEGEN./OSTEO (ICD-715.96) Assessment: Deteriorated  Orders: Est. Patient Level III (47425)   Problem # 4:  DERANGEMENT MENISCUS (ICD-717.5) Assessment: Deteriorated  Orders: Est. Patient Level III (95638)    Patient Instructions: 1)  MRI left knee  2)  You have received an injection of cortisone today. You may experience increased pain at the injection site. Apply ice pack to the area for 20 minutes every 2 hours and take 2 xtra strength tylenol every 8 hours.  This increased pain will usually resolve in 24 hours. The injection will take effect in 3-10 days.  3)  Ice 3 x a day for the next 5 days  4)  Take pain medication and aleve daily

## 2011-01-18 NOTE — Letter (Signed)
Summary: History form  History form   Imported By: Jacklynn Ganong 07/25/2010 16:36:45  _____________________________________________________________________  External Attachment:    Type:   Image     Comment:   External Document

## 2011-01-18 NOTE — Assessment & Plan Note (Signed)
Summary: 6 we reck rt elbow/amer heritage/bsf   Visit Type:  Follow-up  CC:  right elbow pain.  History of Present Illness: NW:GNFAO elbow.  Treatment, physical therapy, diclofenac 50 mg twice a day, tennis elbow brace; injection x 2   Medications as stated.  The patient complained of elbow, not any better since last visit, although she did make noticeable improvement 1st or 2nd visit.  She, says she's fine as long as she keeps her tennis elbow brace on.             Allergies: 1)  ! * Demoral 2)  ! Haldol   Shoulder/Elbow Exam  General:    Well-developed, well-nourished, normal body habitus; no deformities, normal grooming.    Skin:    Intact, no scars, lesions, rashes, cafe au lait spots or bruising.    Inspection:    Inspection is normal.    Palpation:    she is still tender over the lateral upper condyle, especially over the radial head articulation with the with the capitellum  Vascular:    Radial, ulnar, brachial, and axillary pulses 2+ and symmetric; capillary refill less than 2 seconds; no evidence of ischemia, clubbing, or cyanosis.    Sensory:    Gross sensation intact in the upper extremities.    Motor:    strength in the RIGHT elbow. Muscle seems to be normal.    Elbow Exam:    Right:    Inspection/Palpation:  she does have a slight loss of extension grab 5 or less. She does have some pain with pronation and supination.  She does have pain with wrist extension-elbow extended.   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  Patient will continue current program. Follow up 8 weeks.  Orders: Est. Patient Level III (13086)  Patient Instructions: 1)  Continue brace, meds and exercises. 2)  Come back in 8 weeks Prescriptions: DICLOFENAC POTASSIUM 50 MG TABS (DICLOFENAC POTASSIUM) 1 by mouth two times a day   #60 x 1   Entered and Authorized by:   Fuller Canada MD   Signed by:   Fuller Canada MD on 10/25/2010   Method used:   Faxed to ...       [facility] (retail)       [address]       [place], [place]  [address]       Ph: [phone]       Fax: [phone]   RxID:   [id number]    Orders Added: 1)  Est. Patient Level III [87564]

## 2011-01-18 NOTE — Miscellaneous (Signed)
Summary: PT initial summary  PT initial summary   Imported By: Jacklynn Ganong 08/10/2010 15:49:02  _____________________________________________________________________  External Attachment:    Type:   Image     Comment:   External Document

## 2011-01-18 NOTE — Assessment & Plan Note (Signed)
Summary: RT ELBOW PAIN NEEDS XR/HAWKINS/BSF   History of Present Illness: A consult has been requested by Dr. Shaune Pollack.  Patient complains of pain in the RIGHT elbow for 6 weeks  Pain started gradually increased over the last 6 weeks and has now become sharp and dull and stabbing in located over the lateral epicondyles  is.  Her pain is 7/10, she's received 2 injections the last one month ago has not taken any medications she notes that when she is grasping picking up objects or turning her hand into supination the pain increases.  She did try some BenGay didn't help  Allergies: 1)  ! * Demoral 2)  ! Haldol  Review of Systems       patient complains of occasional heart murmurs history of snoring joint pain and swelling as stated in the history she also has some numbness and tingling and has a history of anxiety  GI GU skin and hematologic endocrine ALLERGY to an ear nose and throat negative constitutional signs negative  Physical Exam  Additional Exam:  GEN: well developed, well nourished, normal grooming and hygiene, no deformity and normal body habitus.   CDV: pulses are normal, no edema, no erythema. no tenderness  Lymph: normal lymph nodes   Skin: no rashes, skin lesions or open sores   NEURO: normal coordination, reflexes, sensation.   Psyche: awake, alert and oriented. Mood normal   Gait: normal gait  Tenderness over the lateral epicondyle mild swelling.  She's lost 10 of extension has full flexion painful pronation slightly painful supination which were full.  She has normal strength in her hand wrist and elbow.  Her elbow is stable.  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  Orders: Consultation Level III ([address]) Elbow x-ray, 2 views (60454) Joint Aspirate / Injection, Large (20610) Depo- Medrol 40mg  (J1030)  Medications Added to Medication List This Visit: 1)  Diclofenac Potassium 50 Mg Tabs (Diclofenac potassium) .Marland Kitchen.. 1 by mouth two times a day  Patient Instructions: 1)  You have received an injection of cortisone today. You may experience increased pain at the injection site. Apply ice pack to the area for 20 minutes every 2 hours and take 2 xtra strength tylenol every 8 hours. This increased pain will usually resolve in 24 hours. The injection will take effect in 3-10 days.  2)  Physical therapy  3)  return 6 weeks  4)  wear brace full time except bathing and sleeping  Prescriptions: DICLOFENAC POTASSIUM 50 MG TABS (DICLOFENAC POTASSIUM) 1 by mouth two times a day  #60 x 1   Entered and Authorized by:   Fuller Canada MD   Signed by:   Fuller Canada MD on 07/20/2010   Method used:   Print then Give to Patient   RxID:   [id number]

## 2011-01-18 NOTE — Letter (Signed)
Summary: Office notes [facility]  Office notes [facility]   Imported By: Cammie Sickle 03/09/2009 19:35:06  _____________________________________________________________________  External Attachment:    Type:   Image     Comment:   External Document

## 2011-01-18 NOTE — Miscellaneous (Signed)
Summary: Physical therapy order  Physical therapy order   Imported By: Cammie Sickle 07/25/2010 15:59:35  _____________________________________________________________________  External Attachment:    Type:   Image     Comment:   External Document

## 2011-01-18 NOTE — Progress Notes (Signed)
Summary: Initial Eval  Initial Eval   Imported By: Cammie Sickle 01/20/2009 09:07:15  _____________________________________________________________________  External Attachment:    Type:   Image     Comment:   External Document

## 2011-01-18 NOTE — Letter (Signed)
Summary: *Consult Note  [facility]  [address]  [place], [place] [address]   Phone: [phone]  Fax: [phone]    Re:    GERARDO TERRITO DOB:    [birth year]   Dear: Dr. Juanetta Gosling   Thank you for requesting that we see the above patient for consultation.  A copy of the detailed office note will be sent under separate cover, for your review.  Evaluation today is consistent with: tennis elbow   Our recommendation is for: repeat injection, physical therapy, tennis elbow brace, anti-inflammatory   New Orders include: physical therapy   New Medications started today include:    After today's visit, the patients current medications include: 1)  DICLOFENAC POTASSIUM 50 MG TABS (DICLOFENAC POTASSIUM) 1 by mouth two times a day   Thank you for this consultation.  If you have any further questions regarding the care of this patient, please do not hesitate to contact me @ [phone]  Thank you for this opportunity to look after your patient.  Sincerely,   Fuller Canada MD

## 2011-01-18 NOTE — Assessment & Plan Note (Signed)
Summary: mri results from ap/frs    History of Present Illness: I saw Rachel Sutton in the office today for a followup visit.  She is a 50 years old woman with the complaint of:  LEFT KNEE PAIN.  MRI results today.  Last visit, she had fluid drawn off and a cortisone shot, she says that it helped.  Patient states her knee is doing much better.  IMPRESSION:   1.  Negative for meniscal or cruciate ligament tear. 2.  Septated fluid collection rising on the proximal tib-fib joint could represent a ganglion cyst joint effusion.  History:  I saw Rachel Sutton in the office today for a followup visit.  She is a 50 years old woman with the complaint of:  NEW PROBLEM. LEFT KNEE PAIN.  ER on 01-08-09 for left knee pain and swelling. Xrays were negative for fracture, small effusion.  Patient states no injury. She says that her knee starting hurting that morning. She says that she has had swelling on and off for 6 months. She has been doing the Wii fit.  musculoskeletal review of systems otherwise negative    Current Allergies: ! * [facility] ! HALDOL     Review of Systems  Neuro      Denies headache, dizziness, migraines, numbness, weakness, tremor, and unsteady walking.  MS      Denies joint pain, rheumatoid arthritis, joint swelling, gout, bone cancer, osteoporosis, and .   Physical Exam    LEFT knee no  joint effusion.  0-125 range of motion, alignment normal, motor exam normal, ligament exam normal, no medial joint line tenderness.  No McMurray's maneuver.       Impression & Recommendations:  Problem # 1:  JOINT EFFUSION, LEFT KNEE (ICD-719.06) Assessment: Improved The MRI was done at [facility] and it was reviewed with the report  fluid no meniscal damage  Orders: Est. Patient Level III ([address])    Patient Instructions: 1)  Please schedule a follow-up appointment as needed.

## 2011-01-18 NOTE — Letter (Signed)
Summary: [facility] Office note - Dr Welch/Dr Newell Coral  External Correspondence   Imported By: Elvera Maria 12/21/2008 12:13:52  _____________________________________________________________________  External Attachment:    Type:   Image     Comment:   [facility]

## 2011-01-18 NOTE — Miscellaneous (Signed)
Summary: OT clinical evaluation  OT clinical evaluation   Imported By: Jacklynn Ganong 08/08/2010 11:49:35  _____________________________________________________________________  External Attachment:    Type:   Image     Comment:   External Document

## 2011-01-18 NOTE — Assessment & Plan Note (Signed)
Summary: 8 WK RE-CK/[employer]/CAF   Visit Type:  Follow-up  CC:  recheck elbow.  History of Present Illness: EA:VWUJW elbow. lateral epicondylitis  Treatment, physical therapy, diclofenac 50 mg twice a day, tennis elbow brace; injection x 2     Today is 8 week recheck on tennis elbow after bracing, injections and Diclofenac. Still having pain. Pain level is 4 today. Some swelling. Has some numbness right middle and ring fingers.  No NCS.              Allergies: 1)  ! * Demoral 2)  ! Haldol  Physical Exam  Skin:  intact without lesions or rashes Psych:  alert and cooperative; normal mood and affect; normal attention span and concentration   Shoulder/Elbow Exam  General:    Well-developed, well-nourished, normal body habitus; no deformities, normal grooming.    Inspection:    swelling: RIGHT lateral epicondylar  Palpation:    RIGHT lateral epicondyle  Vascular:    Radial, ulnar, brachial, and axillary pulses 2+ and symmetric; capillary refill less than 2 seconds; no evidence of ischemia, clubbing, or cyanosis.    Sensory:    Gross sensation intact in the upper extremities.  abnormal sensation with wrist extension, suggestive of posterior interosseous nerve syndrome  Motor:    Normal strength in the upper extremities.    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 Patient Level III (11914)  Patient Instructions: 1)  Please schedule a follow-up appointment in 2 months. re check  Prescriptions: DICLOFENAC  POTASSIUM 50 MG TABS (DICLOFENAC POTASSIUM) 1 by mouth two times a day  #60 x 1   Entered and Authorized by:   Fuller Canada MD   Signed by:   Fuller Canada MD on 12/27/2010   Method used:   Faxed to ...       [facility] (retail)       [address]       [place], [place]  [address]       Ph: [phone]       Fax: [phone]   RxID:   [id number]    Orders Added: 1)  Est. Patient Level III [40347]

## 2011-01-18 NOTE — Consult Note (Signed)
Summary: Report [facility]  [facility]   Imported By: Cammie Sickle 04/20/2008 18:29:04  _____________________________________________________________________  External Attachment:    Type:   Image     Comment:   External Document

## 2011-01-18 NOTE — Assessment & Plan Note (Signed)
Summary: 6 WK RE-CK RT ELBOW FOL'G PT/AMER HERIT/CAF   Visit Type:  Follow-up  CC:  right elbow.  History of Present Illness: I saw Rachel Sutton in the office today for a 6 week  followup visit.  She is a 50 years old woman with the complaint of:  right elbow  Follow up after therapy, and injection.  Medications: Diclofenac Potassium 50 Mg Tabs (Diclofenac potassium) .Marland Kitchen.. 1 by mouth two times a day  Patient states her elbow is alot better, she has finished therapy.  Allergies: 1)  ! * Demoral 2)  ! Haldol  Review of Systems Neurologic:  Denies numbness and tingling. Musculoskeletal:  Complains of joint pain; denies swelling, instability, and stiffness.   Shoulder/Elbow Exam  General:    Well-developed, well-nourished, normal body habitus; no deformities, normal grooming.    Skin:    Intact, no scars, lesions, rashes, cafe au lait spots or bruising.    Inspection:    Inspection is normal.    Palpation:    mild lateral epicondylar tenderness right elbow   Vascular:    Radial, ulnar,  2+ and  capillary refill less than 2 seconds;  right arm   Sensory:    Gross sensation intact in the upper extremities.    Motor:    Normal strength in the upper extremities.    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 Patient Level III (08657)  Patient Instructions: 1)  Ice massage  2)  brace  3)  exercises  at home  4)  Return in 6 weeks

## 2011-01-18 NOTE — Progress Notes (Signed)
Summary: MRI appointment.  Phone Note Outgoing Call   Call placed by: Waldon Reining,  January 28, 2009 11:26 AM Call placed to: Patient Action Taken: Phone Call Completed, Appt scheduled Summary of Call: I called to give patient her MRI appointment at [facility] on 02-02-09 at 7:30am. Patient has medicaid and medicare, no precert is needed. Patient will follow up back here to go over results.

## 2011-01-18 NOTE — Consult Note (Signed)
Summary: Consult Rpt [facility]  Consult Rpt [facility]   Imported By: Cammie Sickle 07/30/2008 17:34:58  _____________________________________________________________________  External Attachment:    Type:   Image     Comment:   External Document

## 2011-01-18 NOTE — Consult Note (Signed)
Summary: Consult Report [facility]  Consult Report [facility]   Imported By: Cammie Sickle 10/07/2008 19:24:43  _____________________________________________________________________  External Attachment:    Type:   Image     Comment:   External Document

## 2011-01-18 NOTE — Consult Note (Signed)
Summary: Consultation Report  Consultation Report   Imported By: Elvera Maria 10/28/2007 14:09:20  _____________________________________________________________________  External Attachment:    Type:   Image     Comment:   [facility] visit

## 2011-01-18 NOTE — Letter (Signed)
Summary: Out of Work  [facility]  [address]  [place], [place] [address]   Phone: [phone]  Fax: [phone]    January 20, 2009   Employee:  [employer] [facility]    To Whom It May Concern:   For Medical reasons, please excuse the above named employee from work for the following dates:  Start:   January 19, 2009  End:   January 26, 2009  Return to work full duty:    January 26, 2009  If you need additional information, please feel free to contact our office.         Sincerely,    Terrance Mass, MD

## 2011-01-18 NOTE — Consult Note (Signed)
Summary: Report [facility]  Report [facility]   Imported By: Cammie Sickle 03/01/2008 17:27:16  _____________________________________________________________________  External Attachment:    Type:   Image     Comment:   External Document

## 2011-01-18 NOTE — Assessment & Plan Note (Signed)
Summary: RE-CK RT ELBOW/DISCUSS SURGERY/OPTIONS/PCHS MULTIPLAN/CAF   Visit Type:  Follow-up  CC:  right elbow pain .  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 She is still having 7/10 pain with some numbness in the index, long, and ring finger. She does have a history of carpal tunnel syndrome, but says this feels different.  She complains of weekend. Grip strength.  She has failed conservative treatment and is ready to proceed with surgery.       Allergies: 1)  ! * Demoral 2)  ! Haldol  Past History:  Past Medical History: Last updated: 01/20/2009 None  Past Surgical History: Last updated: 01/20/2009 Lumpectomy Carpal Tunnel R/L Ray Cage fusion a cervical spine fusion x2 since last visit  Family History: Last updated: 01/20/2009 noncontributory  Social History: Last updated: 01/10/2011 Patient is married.  son recently attempted suicide related to PTSD related to [place] war   Social History: Patient is married.  son recently attempted suicide related to PTSD related to [place] war   Review of Systems      See HPI  Physical Exam  General:  Well developed, well nourished, normal body habitus; no deformities, normal grooming. Head:  normocephalic and atraumatic Neck:  no masses, thyromegaly, or abnormal cervical nodes Lungs:  clear bilaterally to A & P Heart:  regular rate and rhythm, S1, S2 without murmurs, rubs, gallops, or clicks Abdomen:  bowel sounds positive; abdomen soft and non-tender without masses, organomegaly, or hernias noted Msk:  RIGHT ELBOW   LATERAL TENDERNESS OVER THE LAT EPICONDYLE   PAIN WITH EXT. WITH NORMAL ROM   GRIP WEAK   PAIN WITH RESISTED EXTENSION   Pulses:  NORMAL  Extremities:  GRIP STRENGTH WEAKENED  Neurologic:  NEG TINELS AND NEG  PHALENS   NO TENDERNESS OVER THE CARPLA TUNNEL  Skin:  intact without lesions or rashes Psych:  alert and cooperative; normal mood and affect; normal attention span and concentration   Impression & Recommendations:  Problem # 1:  LATERAL EPICONDYLITIS (ICD-726.32) Assessment Unchanged  RIGHT tennis elbow release.  Discussed procedure reason for surgery. Alternative treatments, and risks of surgery with the patient, which included, but not limited to, pain, and infection.  Patient is to be out of work 2-6 weeks.  Orders: Est. Patient Level IV ([address])  Patient Instructions: 1)  DOS 01/22/11 2)  Preop is 01/16/11 at 11:30 am, take packet to [facility] stay [facility]. 3)  Post op 1 is in our office on 01/24/11   Orders Added: 1)  Est. Patient Level IV [60454]

## 2011-01-22 ENCOUNTER — Ambulatory Visit (HOSPITAL_COMMUNITY): Admission: RE | Admit: 2011-01-22 | Payer: Self-pay | Source: Home / Self Care | Admitting: Orthopedic Surgery

## 2011-01-24 ENCOUNTER — Ambulatory Visit: Payer: Self-pay | Admitting: Orthopedic Surgery

## 2011-01-24 NOTE — Progress Notes (Signed)
Summary:  insurance not covering,cancel surgery for now  Phone Note Call from Patient   Caller: Patient Summary of Call: Patient called to relay that her insurance will not cover out-patient surgery, scheduled 01/22/11. States would cover minimally if she were admitted as in-patient for 1 day, however, her insurance does not cover any out-patient services incl'g physical therapy. I am fol'g up with her insurance carrier, [employer]. Best contact ph# for patient is [phone].   Her pre-op appt is tomorrow 01/16/11. Initial call taken by: Cammie Sickle,  January 15, 2011 2:15 PM  Follow-up for Phone Call        Call to insurer re: out-patient pre-cert and related information.  Carrier is: [facility] under employer [employer].  - I spoke with Oswaldo Done at [facility], and he states that patient has already exhausted her coverage's out-patient benefits during their time period of 05/07/2010 through 05/08/2011.  Insurance would then cover their allowed amt, which is $1250 total, for out-pt, after 05/08/11.  As for in-patient, not an option for this procedure per Dr Romeo Apple.    Follow-up by: Cammie Sickle,  January 15, 2011 2:21 PM  Additional Follow-up for Phone Call Additional follow up Details #1::         - I advised patient.   Patient is likely going to hold off on the pre-op appointment for 11:30am tomorrow, and possibly hold on surgery, due to insurance limitations.  She will call us in the morning to speak w/nurse if so. Additional Follow-up by: Cammie Sickle,  January 15, 2011 5:45 PM    Additional Follow-up for Phone Call Additional follow up Details #2::    I dont know what to tell her, either she can pay out of pocket or no surgery If Insurance will not pay for it Follow-up by: Ether Griffins,  January 16, 2011 8:35 AM  Additional Follow-up for Phone Call Additional follow up Details #3:: Details for Additional Follow-up Action Taken: Patient will cancel surgery for 01/22/11. She is making financial  arrangements and is  to call to re-schedule asap. Asked for nurse to please cancel today's pre-op appt + Feb 6 surgery until further notice.  cxed surgery and preop with [facility]   Okey Regal will you cancel post op appt, thanks    * I have cancelled it.   Additional Follow-up by: Cammie Sickle,  January 16, 2011 8:56 AM

## 2011-01-30 ENCOUNTER — Ambulatory Visit (INDEPENDENT_AMBULATORY_CARE_PROVIDER_SITE_OTHER): Payer: No Typology Code available for payment source | Admitting: Orthopedic Surgery

## 2011-01-30 ENCOUNTER — Encounter: Payer: Self-pay | Admitting: Orthopedic Surgery

## 2011-01-30 DIAGNOSIS — M7710 Lateral epicondylitis, unspecified elbow: Secondary | ICD-10-CM

## 2011-02-02 ENCOUNTER — Telehealth (INDEPENDENT_AMBULATORY_CARE_PROVIDER_SITE_OTHER): Payer: Self-pay | Admitting: *Deleted

## 2011-02-05 ENCOUNTER — Encounter (HOSPITAL_COMMUNITY): Payer: No Typology Code available for payment source | Attending: Orthopedic Surgery

## 2011-02-05 ENCOUNTER — Other Ambulatory Visit: Payer: Self-pay | Admitting: Orthopedic Surgery

## 2011-02-05 DIAGNOSIS — Z01812 Encounter for preprocedural laboratory examination: Secondary | ICD-10-CM | POA: Insufficient documentation

## 2011-02-05 LAB — BASIC METABOLIC PANEL
BUN: 21 mg/dL (ref 6–23)
CO2: 27 mEq/L (ref 19–32)
Calcium: 9.6 mg/dL (ref 8.4–10.5)
Chloride: 102 mEq/L (ref 96–112)
Creatinine, Ser: 0.8 mg/dL (ref 0.4–1.2)
GFR calc Af Amer: >60 mL/min (ref >60)
GFR calc non Af Amer: >60 mL/min (ref >60)
Glucose, Bld: 96 mg/dL (ref 70–99)
Potassium: 4.5 mEq/L (ref 3.5–5.1)
Sodium: 138 mEq/L (ref 135–145)

## 2011-02-05 LAB — SURGICAL PCR SCREEN
MRSA, PCR: NEGATIVE
Staphylococcus aureus: POSITIVE — AB

## 2011-02-05 LAB — HEMOGLOBIN AND HEMATOCRIT, BLOOD
HCT: 41.2 % (ref 36.0–46.0)
Hemoglobin: 14.3 g/dL (ref 12.0–15.0)

## 2011-02-07 NOTE — Letter (Signed)
Summary: surgery order RTelbow sched 02/09/11  surgery order RTelbow sched 02/09/11   Imported By: Cammie Sickle 01/30/2011 20:44:22  _____________________________________________________________________  External Attachment:    Type:   Image     Comment:   External Document

## 2011-02-07 NOTE — Assessment & Plan Note (Signed)
Summary: DISCUSS SURGERY/RT ELBOW/SELF PAY/CAF *per Dr Romeo Apple, NCPA0...   Visit Type:  Follow-up  CC:  discuss elbow surgery.  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 She is still having 7/10 pain with some numbness in the index, long, and ring finger. She does have a history of carpal tunnel syndrome, but says this feels different.  She complains of weakened  Grip strength.  She has failed conservative treatment and is ready to proceed with surgery.  She said that she was going to have to pay out of pocket for the surgery.  So we are rescheduling today   She says her pain is increasing and her motion is worsening   Diclfenac 50mg  two times a day and Tylenol for pain.       Current Medications (verified): 1)  Diclofenac Potassium 50 Mg Tabs (Diclofenac Potassium) .Marland Kitchen.. 1 By Mouth Two Times A Day  Allergies (verified): 1)  ! * Demoral 2)  ! Haldol  Past History:  Past Medical History: Last updated: 01/20/2009 None  Past Surgical History: Last updated: 01/20/2009 Lumpectomy Carpal Tunnel R/L Ray Cage fusion a cervical spine fusion x2 since last visit  Family History: Last updated: 01/20/2009 noncontributory  Social History: Last updated: 01/10/2011 Patient is married.  son recently attempted suicide related to PTSD related to [place] war   Review of Systems Neurologic:  Complains of numbness.  The review of systems is negative for Constitutional, Cardiovascular, Respiratory, Gastrointestinal, Genitourinary, Endocrine, Psychiatric, Skin, HEENT, Immunology, and Hemoatologic.  Physical Exam  Additional Exam:  GEN: well developed, well nourished, normal grooming and hygiene, no deformity and normal body habitus.   CDV: pulses are normal, no edema, no erythema. no  tenderness  Lymph: normal lymph nodes   Skin: no rashes, skin lesions or open sores   NEURO: normal coordination, reflexes, sensation.   Psyche: awake, alert and oriented. Mood normal   Gait: normal gait  Tenderness over the lateral epicondyle mild swelling.  She's lost 10 of extension has full flexion painful pronation slightly painful supination which were full.  She has normal strength in her hand wrist and elbow.  Her elbow is stable.  Has a positive wrist extension against resistance test     Impression & Recommendations:  Problem # 1:  LATERAL EPICONDYLITIS (ICD-726.32) Assessment Unchanged  Orders: No Charge Patient Arrived (NCPA0) (NCPA0) right tennis elbow release   Patient Instructions: 1)  DOS 02/09/11 2)  Post op 02/12/11 3)  right elbow   Orders Added: 1)  No Charge Patient Arrived (NCPA0) [NCPA0]

## 2011-02-07 NOTE — Letter (Signed)
Summary: Out of Work  [facility]  [address]  [place], [place] [address]   Phone: [phone]  Fax: [phone]    January 30, 2011   Employee:  TRUDY KORY [facility]    To Whom It May Concern:   For Medical reasons, please excuse the above named employee from work for the following dates:  Start:   02/09/11  End/Estimated return to work:   03/23/11 or as otherwise notified   If you need additional information, please feel free to contact our office.         Sincerely,    Terrance Mass, MD

## 2011-02-09 ENCOUNTER — Ambulatory Visit (HOSPITAL_COMMUNITY)
Admission: RE | Admit: 2011-02-09 | Discharge: 2011-02-09 | Disposition: A | Discharge status: Home / Self Care | Payer: Self-pay | Source: Ambulatory Visit | Attending: Orthopedic Surgery | Admitting: Orthopedic Surgery

## 2011-02-09 DIAGNOSIS — Z79899 Other long term (current) drug therapy: Secondary | ICD-10-CM | POA: Insufficient documentation

## 2011-02-09 DIAGNOSIS — M7710 Lateral epicondylitis, unspecified elbow: Secondary | ICD-10-CM

## 2011-02-09 DIAGNOSIS — I10 Essential (primary) hypertension: Secondary | ICD-10-CM | POA: Insufficient documentation

## 2011-02-12 ENCOUNTER — Ambulatory Visit (INDEPENDENT_AMBULATORY_CARE_PROVIDER_SITE_OTHER): Payer: Self-pay | Admitting: Orthopedic Surgery

## 2011-02-12 ENCOUNTER — Encounter: Payer: Self-pay | Admitting: Orthopedic Surgery

## 2011-02-12 DIAGNOSIS — Z4789 Encounter for other orthopedic aftercare: Secondary | ICD-10-CM

## 2011-02-12 DIAGNOSIS — M7710 Lateral epicondylitis, unspecified elbow: Secondary | ICD-10-CM

## 2011-02-13 NOTE — Progress Notes (Signed)
Summary: call to insurer + patient re:Insurance non-coverage  Phone Note Outgoing Call   Call placed to: Insurer Summary of Call: Call to insurer [facility] [employer].  Previously, prior to initial surg date, 01/22/11, insurer states patient has used her annual out-patient benefits ($1250) has been used up for 2012. Patient had been aware and has made financial arrangements as "self pay" basis. Per automated message system reached today, her coverage has terminated  re: "Mini-medical, vision, dental, + acc/life, effective 12/17/10 Left msg to follow up w/[facility], her insurance sponsor.   Initial call taken by: Cammie Sickle,  February 02, 2011 1:22 PM  Follow-up for Phone Call        [facility] [employer], for [employer] - spoke w/Hector.  States patient's benefits were updated since 12/17/10 however it is correct that medical, vision, dental insurance terminated as of 01/15/11. * * Called patient - states she is now on Cobra and was advised it would take at least 30 days.    As previously discussed, the surgery is "self pay", as even if Jeannette Corpus is retroactive to include month of Feb 2012, her benefit period is May - May, and all out-pt benefits are exhausted.  Patient's plan has no pre-cert requirements for out-patient procedures. Follow-up by: Cammie Sickle,  February 07, 2011 7:00 PM

## 2011-02-19 ENCOUNTER — Ambulatory Visit: Payer: Self-pay

## 2011-02-20 ENCOUNTER — Ambulatory Visit (INDEPENDENT_AMBULATORY_CARE_PROVIDER_SITE_OTHER): Payer: Self-pay | Admitting: Orthopedic Surgery

## 2011-02-20 ENCOUNTER — Encounter: Payer: Self-pay | Admitting: Orthopedic Surgery

## 2011-02-20 DIAGNOSIS — Z4789 Encounter for other orthopedic aftercare: Secondary | ICD-10-CM

## 2011-02-20 DIAGNOSIS — M7710 Lateral epicondylitis, unspecified elbow: Secondary | ICD-10-CM

## 2011-02-20 NOTE — Op Note (Addendum)
[name], [name]                ACCOUNT NO.:  [id number]  MEDICAL RECORD NO.:  [id number]           PATIENT TYPE:  O  LOCATION:  DAYP                          FACILITY:  [facility]  PHYSICIAN:  Vickki Hearing, M.D.DATE OF BIRTH:  11-03-1961  DATE OF PROCEDURE:  02/09/2011 DATE OF DISCHARGE:                              OPERATIVE REPORT   PREOPERATIVE DIAGNOSIS:  Tennis elbow, right upper extremity.  POSTOPERATIVE DIAGNOSIS:  Tennis elbow, right upper extremity.  PROCEDURE:  Right tennis elbow release.  SURGEON:  Vickki Hearing, MD  ASSISTANT:  Lima Nation, RN  ANESTHESIA:  General by LMA.  OPERATIVE FINDINGS:  Angiofibroblastic response of the extensor carpi radialis brevis.  TOURNIQUET:  Used at 250 mmHg.  SPECIMENS:  No specimens.  ESTIMATED BLOOD LOSS:  Minimal.  COMPLICATIONS:  There were no complications.  DISPOSITION:  The patient went to recovery room in good condition.  DESCRIPTION OF PROCEDURE:  The patient was identified as Rachel Sutton.  Her right arm was marked as the surgical site and surgeon, myself, countersigned and chart was updated.  The patient was taken to surgery, given 1 gram of Ancef, and had general intubation by LMA that went well.  Arm was prepped and draped sterilely.  Tourniquet was elevated after exsanguination of the limb.  A curved incision was made over the lateral epicondyle.  Subcutaneous tissue was divided bluntly.  The EDC and ECRL were split.  Blunt dissection was carried down to the ECRB. Angiofibroblastic response was noted and debrided.  ECRB and portion of ECRL were removed from the lateral epicondyle.  Bone was roughened with a rongeur and then the tendon was repaired to the remaining periosteum with three #2 interrupted Ethibond sutures.  Wound was irrigated and closed in layered fashion with 0-Monocryl and skin staples.  Marcaine 20 mL and epinephrine were injected in the subcu tissues.  Dressing was  applied.  Tourniquet was released.  Hand had good color. Bulky dressing was applied.  Sling was applied.  The patient was extubated and taken to the recovery in stable condition.  Initial exercise will be done at home, then followed by physical therapy.     Vickki Hearing, M.D.     SEH/MEDQ  D:  02/09/2011  T:  02/10/2011  Job:  [id number]  Electronically Signed by Fuller Canada M.D. on 02/20/2011 11:40:33 AM Electronically Signed by Fuller Canada M.D. on 02/20/2011 12:03:36 PM Electronically Signed by Fuller Canada M.D. on 02/20/2011 12:18:01 PM Electronically Signed by Fuller Canada M.D. on 02/20/2011 12:34:11 PM Electronically Signed by Fuller Canada M.D. on 02/20/2011 12:52:02 PM Electronically Signed by Fuller Canada M.D. on 02/20/2011 01:10:01 PM Electronically Signed by Fuller Canada M.D. on 02/20/2011 01:31:25 PM Electronically Signed by Fuller Canada M.D. on 02/20/2011 01:52:14 PM Electronically Signed by Fuller Canada M.D. on 02/20/2011 02:12:04 PM Electronically Signed by Fuller Canada M.D. on 02/20/2011 02:32:59 PM Electronically Signed by Fuller Canada M.D. on 02/20/2011 02:56:19 PM Electronically Signed by Fuller Canada M.D. on 02/20/2011 03:21:10 PM Electronically Signed by Fuller Canada M.D. on 02/20/2011 03:45:28 PM Electronically Signed by Fuller Canada M.D. on 02/20/2011 04:23:01  PM Electronically Signed by Fuller Canada M.D. on 02/20/2011 04:54:19 PM Electronically Signed by Fuller Canada M.D. on 02/20/2011 04:54:19 PM Electronically Signed by Fuller Canada M.D. on 02/20/2011 07:36:57 PM

## 2011-02-22 ENCOUNTER — Ambulatory Visit
Admission: RE | Admit: 2011-02-22 | Discharge: 2011-02-22 | Disposition: A | Discharge status: Home / Self Care | Payer: No Typology Code available for payment source | Source: Ambulatory Visit | Attending: Oncology | Admitting: Oncology

## 2011-02-22 DIAGNOSIS — Z1239 Encounter for other screening for malignant neoplasm of breast: Secondary | ICD-10-CM

## 2011-02-22 NOTE — Assessment & Plan Note (Signed)
Summary: POST-OP/ELBOW/MULTIPLAN PHCS/SLJ   Visit Type:  Follow-up  CC:  post op elbow.  History of Present Illness:   Postop visit #1 status post RIGHT tennis elbow release  Diagnosis RIGHT tennis elbow. DOS 02/09/11  POD 3.  Norco 5 for pain.  complains of 7/10 pain with Norco 5 mg  Her incision looks pristine.  She is moving her hand I little bit I encouraged her to move it more.  She should start and exercises an starting immediately  Followup next week to get the staples out start with every other staple and then placed a Steri-Strip and progress as required an acceptable  Allergies: 1)  ! * Demoral 2)  ! Haldol   Impression & Recommendations:  Problem # 1:  LATERAL EPICONDYLITIS (ICD-726.32) Assessment Comment Only  Orders: Post-Op Check (04540)  Problem # 2:  AFTERCARE FOLLOW SURGERY MUSCULOSKEL SYSTEM NEC (ICD-V58.78) Assessment: Comment Only  Orders: Post-Op Check (98119)  Patient Instructions: 1)  1 week for staples out  2)  start hand exercises and elbow exercises    Orders Added: 1)  Post-Op Check [14782]

## 2011-02-27 NOTE — Letter (Signed)
Summary: Out of Work  [facility]  [address]  [place], [place] [address]   Phone: [phone]  Fax: [phone]    February 20, 2011   Employee:  KERSTIE AG[employer] [facility]    To Whom It May Concern:   For Medical reasons, Return to work on 03/05/2011  3 days a week , 8 hours a day. No key boarding with right upper extremity and no use of the mouse with the right hand    Sincerely,    Fuller Canada MD

## 2011-02-27 NOTE — Assessment & Plan Note (Signed)
Summary: POST OP 2/REM SUTURES/ELBOW/SELF PAY/CAF   Visit Type:  Follow-up  CC:  post op elbow.  History of Present Illness:   Postop visit #2 status post RIGHT tennis elbow release  Diagnosis RIGHT tennis elbow.  DOS 02/09/11  POD 11  Norco 5 for pain,exercises are going good.  In sling today.  Pain level is 4 today.  Today is one week recheck staple removal.  The incision looks good,  She's got about a 10 loss of extension. She can flex to about 120.  Recommend exercise program with flexion-extension of the elbow and wrist. No strength resistance training.      Allergies: 1)  ! * Demoral 2)  ! Haldol   Impression & Recommendations:  Problem # 1:  AFTERCARE FOLLOW SURGERY MUSCULOSKEL SYSTEM NEC (ICD-V58.78) Assessment Improved  Orders: Post-Op Check ([address])  Problem # 2:  LATERAL EPICONDYLITIS (ICD-726.32) Assessment: Improved  Orders: Post-Op Check (60454)  Medications Added to Medication List This Visit: 1)  Norco 5-325 Mg Tabs (Hydrocodone-acetaminophen) .Marland Kitchen.. 1 by mouth q 4 hrs as needed pain  Patient Instructions: 1)  f/u on the 15th Prescriptions: NORCO 5-325 MG TABS (HYDROCODONE-ACETAMINOPHEN) 1 by mouth q 4 hrs as needed pain  #84 x 1   Entered and Authorized by:   Fuller Canada MD   Signed by:   Fuller Canada MD on 02/20/2011   Method used:   Print then Give to Patient   RxID:   [id number]    Orders Added: 1)  Post-Op Check [30865]

## 2011-02-28 ENCOUNTER — Ambulatory Visit: Payer: Self-pay | Admitting: Orthopedic Surgery

## 2011-03-01 ENCOUNTER — Encounter: Payer: Self-pay | Admitting: Orthopedic Surgery

## 2011-03-01 ENCOUNTER — Ambulatory Visit (INDEPENDENT_AMBULATORY_CARE_PROVIDER_SITE_OTHER): Payer: No Typology Code available for payment source | Admitting: Orthopedic Surgery

## 2011-03-01 DIAGNOSIS — M7710 Lateral epicondylitis, unspecified elbow: Secondary | ICD-10-CM

## 2011-03-01 DIAGNOSIS — Z4789 Encounter for other orthopedic aftercare: Secondary | ICD-10-CM

## 2011-03-06 NOTE — Letter (Signed)
Summary: Out of Work  [facility]  [address]  [place], [place] [address]   Phone: [phone]  Fax: [phone]    March 01, 2011   Employee:  SHAUNTE [employer] [facility] [employer]    To Whom It May Concern:   For Medical reasons, please excuse the above named employee from work for the following dates:  Start:   02/09/11  End/Return to work:   03/12/11 full duty                                      as tolerated  If you need additional information, please feel free to contact our office.         Sincerely,   Terrance Mass MD

## 2011-03-06 NOTE — Assessment & Plan Note (Signed)
Summary: RE-CHECK ELBOW/POST OP/SELF PAY/CAF   Visit Type:  Follow-up  CC:  post op elbow.  History of Present Illness:   Postop visit #3 status post RIGHT tennis elbow release  Diagnosis RIGHT tennis elbow.  DOS 02/09/11  POD 2 weeks and 6 days.  Norco 5 for pain as needed,exercises are going good, Ibuprofen 400mg .  Today is recheck after HEP.  Pain level is around 4 today.  Doing better.  Uses ice two times a day.  RTW date is currently Monday, not ready.         Allergies: 1)  ! * Demoral 2)  ! Haldol  Physical Exam  Additional Exam:  RIGHT elbow exam, she has about 8 shy of full extension. She does have full flexion. She has some stiffness and soreness with wrist extension.  Incision looks good.   Impression & Recommendations:  Problem # 1:  AFTERCARE FOLLOW SURGERY MUSCULOSKEL SYSTEM NEC (ICD-V58.78) Assessment Comment Only  Orders: Post-Op Check (24401)  Problem # 2:  LATERAL EPICONDYLITIS (ICD-726.32) Assessment: Comment Only  Orders: Post-Op Check (02725)  Patient Instructions: 1)  come back in 3 weeks 2)  start the other exercises on your sheet, the ones that are not highlighted 3)  OOW until 03/12/11   Orders Added: 1)  Post-Op Check [36644]

## 2011-03-27 ENCOUNTER — Ambulatory Visit (INDEPENDENT_AMBULATORY_CARE_PROVIDER_SITE_OTHER): Payer: Self-pay | Admitting: Orthopedic Surgery

## 2011-03-27 DIAGNOSIS — M7710 Lateral epicondylitis, unspecified elbow: Secondary | ICD-10-CM

## 2011-03-27 NOTE — Patient Instructions (Signed)
Continue exercises until the end of the month

## 2011-03-27 NOTE — Progress Notes (Signed)
Status post open RIGHT tennis elbow release.  Diagnosis RIGHT tennis elbow.  Date of surgery February 24.  Patient reports doing well. She is, happy. She's had surgery.  She has full range of motion.  Her incision is clean, dry, and intact. No signs of redness, or erythema.  Patient is advised to continue exercises until the end of the month and followup as needed

## 2011-04-23 ENCOUNTER — Telehealth: Payer: Self-pay | Admitting: *Deleted

## 2011-04-23 NOTE — Telephone Encounter (Signed)
No

## 2011-04-23 NOTE — Telephone Encounter (Signed)
Said after mowing yard she had flare up of her knee swelling, advised ace wrap, ice, rest, Ibuprofen 800mg  every 8 hrs, if this did not help advised her to call for appt, any other suggestions

## 2011-05-01 NOTE — Consult Note (Signed)
Rachel Sutton, DETZEL                ACCOUNT NO.:  [id number]   MEDICAL RECORD NO.:  [id number]          PATIENT TYPE:  INP   LOCATION:  3032                         FACILITY:  [facility]   PHYSICIAN:  Christell Faith, MD   DATE OF BIRTH:  05-28-1961   DATE OF CONSULTATION:  06/30/2007  DATE OF DISCHARGE:                                 CONSULTATION   CONSULT REQUESTED BY:  Dr. Channing Mutters with neurosurgery for chest pain,  requesting please evaluate and make treatment recommendations.   CONSULTING PHYSICIAN:  Dr. Dietrich Pates.   HISTORY OF PRESENT ILLNESS:  This is a 50 year old white female with a  history of breast cancer, mitral valve prolapse, and tobacco abuse who  underwent cervical spine surgery today by Dr. Channing Mutters.  She presented with  right cervical radiculopathy and dynamic anterolisthesis of C4 and C5,  and underwent diskectomy and arthrodesis of the C4 through C7 spaces.  She did well postoperatively until around 6 p.m. when she started  complaining of 6/10 chest pain in the left chest.  This was a pressure,  in a very small area left of the sternum, which she says has been  constant until the time I am seeing her, which is currently 9:30 p.m.  She denies shortness of breath.  She is drowsy and is drifting off to  sleep as we are talking.  She had received 2 sublingual nitroglycerins  which she said made her feel dizzy, and brought her chest pain down to a  4/10.  She has also received morphine and multiple Percocets and is  feeling nauseous.  In 2003 she was admitted to the hospital with non-  cardiac chest pain which felt similar to what she is experiencing now.   PAST MEDICAL HISTORY:  1. Breast cancer stage 1.  Status post mastectomy in 2003.  2. Mitral valve prolapse with no mitral regurgitation and an ejection      fraction of 65% from echo in 2007.  3. Severe spinal arthritis.  4. Tobacco abuse.  5. Endometriosis.  Status post total abdominal hysterectomy.   SOCIAL HISTORY:  She  has 2 children.  She does smoke on and off.  She is  a social drinker.   FAMILY HISTORY:  Father began having heart attacks in his late 25s,  early 88s.  Grandfather had lung cancer.  She has a brother with a PCI  at age 54.   CURRENT MEDICATIONS:  Cefazolin, Toradol, Percocet, and morphine.   REVIEW OF SYSTEMS:  Prior to admission, she had no angina, heart  failure, symptoms, syncope, palpitations, dyspnea on exertion, nausea,  vomiting, or diarrhea.  She did have neck pain, back pain, and tingling  in the right arm.  The balance of the 10 systems reviewed and is  negative.   PHYSICAL EXAMINATION:  VITAL SIGNS:  Temperature 97.8, heart rate 62 to  72, blood pressure 82 to 105/48 to 64, saturations 97% on room air.  GENERAL:  This is a pleasant white female in no distress.  She is  drowsy, falling asleep.  She is in a neck collar  with a drain.  EYES:  Pupils are small, but reactive.  LUNGS:  Clear to auscultation bilaterally.  JVD and carotid bruits  cannot be assessed.  CARDIAC:  Normal rate, regular rhythm, 2/6 holosystolic murmur.  ABDOMEN:  Soft, nontender.  EXTREMITIES:  She has no lower extremity edema.  SCDs are in place.  2+  dorsalis pedis and radial pulses bilaterally.   IMPRESSION:  A 50 year old white female with atypical chest pain.  Currently complaining of 4/10 pain, but falling asleep during the  interview.  EKG is negative at the time that she is complaining of this  pain.  She does have risk factors including family history and tobacco.  However, suspicion for acute coronary syndrome is low.   PLAN:  1. Will keep on a telemetry monitor.  2. Cycle EKGs and repeat EKG p.r.n. for increased pain.  3. Will cycle cardiac enzymes and check basic chemistries.  4. Will initiate aspirin 325 mg daily.  5. Consider an echo for mitral valve prolapse and wall motion      abnormality.  6. I will check on her throughout the night.  If her EKG changes or      cardiac enzymes  are abnormal at all, or for continued pain, will      move her to cardiac telemetry and treat as needed.      Christell Faith, MD  Electronically Signed     NDL/MEDQ  D:  06/30/2007  T:  07/01/2007  Job:  [id number]

## 2011-05-01 NOTE — Op Note (Signed)
Rachel Sutton, Rachel Sutton                ACCOUNT NO.:  [id number]   MEDICAL RECORD NO.:  [id number]          PATIENT TYPE:  OBV   LOCATION:  3599                         FACILITY:  [facility]   PHYSICIAN:  Hewitt Shorts, M.D.DATE OF BIRTH:  [birth year]   DATE OF PROCEDURE:  DATE OF DISCHARGE:                               OPERATIVE REPORT   PREOPERATIVE DIAGNOSES:  C6-C7 pseudoarthrosis/nonunion and neck pain.   POSTOPERATIVE DIAGNOSES:  C6-C7 pseudoarthrosis/nonunion and neck pain.   PROCEDURE:  C6-C7 posterior cervical arthrodesis with Oasis posterior  instrumentation and morselized allograft.   SURGEON:  Hewitt Shorts, MD   ASSISTANT:  Webb Silversmith, NP, and Phoebe Perch.   ANESTHESIA:  General endotracheal.   INDICATIONS:  The patient is a 50 year old woman status post of 3 levels  C4-C5, C5-C6, and C6-C7 anterior cervical diskectomy and fusion.  The C4-  C5 and C5-C6 levels healed well.  She developed nonunion and  pseudoarthrosis at C6-C7 with persistent posterior neck pain with  dynamic motion to flexion and extension.  Notably, she is a pack-a-day  smoker.  We strongly encouraged her to quit smoking and because of the  persistence of neck pain in the face of pseudoarthrosis, a decision was  made to proceed with a posterior cervical arthrodesis.   PROCEDURE:  The patient was brought to the operating room and placed  under general endotracheal anesthesia.  The patient was placed in a 3-  pin Mayfield head holder and turned to a prone position with the neck in  the neutral position.  The posterior aspect of the neck was prepped with  Betadine soap and solution and draped in a sterile fashion.  The C6-C7  level was localized and the  midline infiltrated with local anesthetic  with epinephrine and midline incision made carried down through the  subcutaneous tissue.  Bipolar cautery and electrocautery was used to  maintain hemostasis.  Dissection was carried down through the cervical  fascia, which was incised bilaterally and the paracervical musculature  was dissected from the spinous process of lamina in a subperiosteal  fashion.  X-rays were taken and the C6-C7 level identified and then with  loupe magnification, we identified the facets bilaterally at C6 and C7  and a posterior entry points were identified and starting with an awl  and then using drill in the anterior lateral superior trajectory to the  lateral mass.  The lateral masses had 14-mm drill holes placed  bilaterally.  Each of these drill holes were examined with a ball probe.  Good bony surfaces were noted.  No cutouts were found.  The posterior  cortex was tapped and then we placed 14-mm screws bilaterally at each  level.  This was done with C-arm fluoroscopic guidance and all four  other screws were in good position within the lateral masses.  We then  selected a 30-mm rod and placed within the screw heads and secured with  locking caps.  Once all 4 locking caps were placed, they were tightened  against the counter torque.  We then decorticated the facet joints and  laminar surfaces, the C6-C7  joint and the lamina C6 and C7 with the X-  Max drill, and then we tacked 5 mL of OrthoBlast demineralized bone  matrix putty with cancellous chips in the joint and over the lamina and  then proceeded with the closure.  The deep fascia was closed with  interrupted undyed #1 Vicryl sutures and 0 Vicryl sutures.  The  subcutaneous and subcuticular were closed with interrupted inverted 2-0  undyed Vicryl sutures.  The skin was reapproximated with Dermabond.  The  procedure was tolerated well.  The estimated blood loss was less than 10  mL.  Sponge and needle count were correct.   Following surgery, the patient was returned back to supine position.  The 3-pin Mayfield head holder was removed.  The patient was reversed  from the anesthetic, extubated, and transferred to the recovery room for  further care where she  was noted to be moving all 4 extremities.      Hewitt Shorts, M.D.  Electronically Signed     RWN/MEDQ  D:  08/26/2008  T:  08/27/2008  Job:  [id number]

## 2011-05-01 NOTE — Op Note (Signed)
NAMETALINE, NASS                ACCOUNT NO.:  [id number]   MEDICAL RECORD NO.:  [id number]          PATIENT TYPE:  INP   LOCATION:  3032                         FACILITY:  [facility]   PHYSICIAN:  Hewitt Shorts, M.D.DATE OF BIRTH:  March 13, 1961   DATE OF PROCEDURE:  06/30/2007  DATE OF DISCHARGE:                               OPERATIVE REPORT   PREOPERATIVE DIAGNOSIS:  C4-5 dynamic anterolisthesis, C5-6 and C6-7  degenerative disk disease and C5-6 and C6-7 cervical spondylosis and  right cervical radiculopathy.   POSTOPERATIVE DIAGNOSIS:  C4-5 dynamic anterolisthesis, C5-6 and C6-7  degenerative disk disease and C5-6 and C6-7 cervical spondylosis and  right cervical radiculopathy.   PROCEDURE:  C4-5, C5-6 and C6-7 anterior cervical diskectomy and  arthrodesis with allograft and tether cervical plating.   SURGEON:  Hewitt Shorts, M.D.   ASSISTANTS:  1. Russell L. Webb Silversmith, RN  2. Payton Doughty, M.D.   ANESTHESIA:  General endotracheal.   INDICATION:  The patient is a 50 year old woman who presented with a  right cervical radiculopathy, was found to have advanced degenerative  disk disease and spondylosis at the C5-6 and C6-7 levels with  significant osteophytic encroachment of the neural foramina on the right  at C5-6 and C6-7 and on the left at C5-6.  There was a dynamic  anterolisthesis of C4 and 5 and a decision was made to proceed with the  three level anterior cervical diskectomy and arthrodesis.   PROCEDURE:  The patient was brought to the operating room, placed under  general endotracheal anesthesia.  The patient was placed in 10 pounds of  Holter traction and the neck was prepped with Betadine soaping solution  and draped in a sterile fashion.  An oblique incision was made  paralleling the anterior border of the left sternocleidomastoid.  The  incision was infiltrated with local anesthetic with epinephrine.  Dissection was carried down through the subcutaneous  tissue and  platysma.  Bipolar and electrocautery was used to maintain hemostasis.  Dissection was then carried out through an avascular plane leaving the  sternocleidomastoid, carotid artery and jugular vein laterally and the  trachea and esophagus medially.  The ventral aspect of the vertebral  column was identified and localizing x-ray taken and the C4-5, C5-6 and  C6-7 intervertebral disk spaces identified.  Diskectomy was begun at  each level with incision of the annulus and continued with microcurets  and pituitary rongeurs.  There was significant anterior osteophytic  overgrowth at the C5-6 and C6-7 levels and this was removed using an  osteophyte removal tool.   The microscope was draped and brought into the field to provide  additional magnification, illumination and visualization and then the  decompression was performed using microdissection and microsurgical  technique.  The diskectomy was continued posteriorly through each level  using microcurets and pituitary rongeurs and then the cartilaginous end  plates of the vertebral body surfaces were removed using microcurets  along with the XMax drill.  Posterior osteophytic growth was removed at  each level using the XMax drill and then a 2-mm Kerrison punch with a  thin foot plate.  Subsequently, at each level, the posterior  longitudinal ligament was carefully removed and we were able to  decompress the spinal canal at each level.  We then turned our attention  to the neural foramina, particularly at the C5-6 and C6-7 levels.  The  spondylitic overgrowth encroaching on the neural foramina was removed  with 2-mm Kerrison punch and we were able to decompress the exiting  nerve roots.  Once the decompression was completed, hemostasis was  established with the use of Gelfoam soaked in thrombin.  We then  measured the heights of the intervertebral disk spaces and selected the  6-mm implant for the C4-5 level and 5-mm implants for  the C5-6 and C6-7  levels.  Each of these bone grafts was hydrated in saline solution and  then carefully positioned in the intravertebral disk space and  countersunk.  We then selected a 3 mm tether cervical plate.  It was  positioned over the fusion construct and secured to the vertebra with 4  x 14 mm variable angle screws.  Each screw hole ws drilled and tapped  and the screws were placed in an alternating fashion.  Once all 6 screws  were in place, final tightening was performed.  Throughout the  procedure, there was mild oozing from bony surfaces and other tissues.  Bipolar cautery was used to establish hemostasis and although the oozing  was controlled, we elected to place a prevertebral Kugelman-Pratt drain.  It was brought out through a separate stab incision and sutured to the  skin with 3-0 nylon suture.  We then proceeded with closure.  The  platysma was closed with interrupted, inverted 2-0 Vicryl undyed Vicryl  sutures.  The subcutaneous and subcuticular were closed with  interrupted, inverted 3-0 undyed Vicryl sutures and the skin was  reapproximated with Dermabond.  The procedure was tolerated well.  The  estimated blood loss was 200 mL.  Sponge and needle count were correct.   Following surgery, the patient was placed in an Aspen collar, to be  reversed from the anesthetic, extubated and transferred to the recovery  room for further care.      Hewitt Shorts, M.D.  Electronically Signed     RWN/MEDQ  D:  06/30/2007  T:  06/30/2007  Job:  [id number]

## 2011-05-04 NOTE — Procedures (Signed)
   NAME:  Rachel Sutton, Rachel Sutton                          ACCOUNT NO.:  [id number]   MEDICAL RECORD NO.:  [id number]                   PATIENT TYPE:  INP   LOCATION:  A223                                 FACILITY:  [facility]   PHYSICIAN:  Fredirick Maudlin, M.D.              DATE OF BIRTH:  1960-12-22   DATE OF PROCEDURE:  DATE OF DISCHARGE:                                EKG INTERPRETATION   ELECTROCARDIOGRAM NUMBER:  [id number]   The rhythm is sinus rhythm with a rate of about 110.  There are Q waves in  V1, V2, and V3, suggestive of a previous anteroseptal myocardial infarction.  Clinical correlation is suggested.   IMPRESSION:  Abnormal electrocardiogram.                                               Fredirick Maudlin, M.D.    ELH/MEDQ  D:  07/23/2002  T:  07/25/2002  Job:  [id number]

## 2011-05-04 NOTE — Discharge Summary (Signed)
NAME:  Rachel Sutton, Rachel Sutton                          ACCOUNT NO.:  [id number]   MEDICAL RECORD NO.:  [id number]                   PATIENT TYPE:  INP   LOCATION:  A223                                 FACILITY:  [facility]   PHYSICIAN:  Isidor Holts                     DATE OF BIRTH:  12-20-1960   DATE OF ADMISSION:  07/22/2002  DATE OF DISCHARGE:  07/23/2002                                 DISCHARGE SUMMARY   PRIMARY MEDICAL DOCTOR:  No PMD at this location, however, Dr. Fredirick Maudlin on call.   DISCHARGE DIAGNOSES:  1. Atypical chest pain likely due to gastroesophageal reflux disease.  2. History of mitral valve prolapse, clinically stable.  3. Mildly overweight.   DISCHARGE MEDICATIONS:  Maalox 30 cc p.o. p.r.n. q.6h. as needed.   HISTORY AND PHYSICAL EXAMINATION:  See dictated H&P.   INVESTIGATIONS:  Chemistry on day of admission:  Sodium 138, potassium 3.7,  chloride 109, CO2 24, BUN 8, creatinine 0.7, glucose 99.  LFTs normal.  CBC:  WBC 6.4, hemoglobin 13.0, hematocrit 36.9, platelets 306,000.  Cardiac  enzymes:  Total CK 118, CK-MB 1.0, troponin I less than 0.01.   EKG:  Sinus rhythm, regular, normal axis, old Q wave in V1 and V2.  No acute  ischemic changes.   Chest x-ray:  Essentially normal.   HOSPITAL COURSE:  Admitted to telemetry unit with a history of atypical  chest pain.  This did remain asymptomatic overnight, however, developed  tinge of similar pain first thing in the morning on July 23, 2002,  otherwise, remained asymptomatic.  Underwent 2-D echocardiogram this morning  which, after discussion with cardiologist, apparently revealed posterior  mitral valve prolapse but normal cardiac chambers, normal ejection fraction,  confirmed report/official report is awaiting.  Cardiac enzymes remained  normal.  There were no progressive elevations.  EKGs remained unchanged.  It  is felt that no further workup will be required on this occasion, however,  should symptoms  recur, it may become necessary to perform an outpatient  Cardiolite stress test.  First lipid profile is still pending, however, in  view of the foregoing, it is deemed appropriate to discharge the patient on  July 23, 2002 and set her up with a primary M.D., who on this occasion will  likely be Dr. Juanetta Gosling.  She has been cautioned about endocarditis  prophylaxis for mitral valve prolapse and is likely to follow up with Dr.  Juanetta Gosling in two weeks' time.    ADDENDUM:  The patient is an ex-smoker and used to smoke three-fourths of a  packet of cigarettes per day but quit approximately one year ago.  Isidor Holts    CO/MEDQ  D:  07/23/2002  T:  07/28/2002  Job:  [id number]   cc:   Fredirick Maudlin, M.D.

## 2011-05-04 NOTE — H&P (Signed)
NAME:  Rachel Sutton, Rachel Sutton                          ACCOUNT NO.:  [id number]   MEDICAL RECORD NO.:  [id number]                   PATIENT TYPE:  EMS   LOCATION:  ED                                   FACILITY:  [facility]   PHYSICIAN:  Christopher Oti                     DATE OF BIRTH:  1961-10-17   DATE OF ADMISSION:  07/22/2002  DATE OF DISCHARGE:                                HISTORY & PHYSICAL   CHIEF COMPLAINT:  Recurrent substernal chest pain for two days.   HISTORY OF PRESENT ILLNESS:  This is a 50 year old Caucasian female with a  history of mitral valve prolapse diagnosed in 1980, confirmed by  echocardiogram.  Start of test conducted presyncopal episode.  She is doing  quite well since.  On 07/21/02, at about midday while at work, experienced  substernal, nonradiating chest pain which she described as sharp and like  pressure.  She initially thought it might be indigestion.  This lasted  approximately two hours.  Denies diaphoresis, shortness of breath, but felt  nauseated.  Another brief episode woke her up from sleep last night.  This  a.m., about 10 a.m., her symptoms recurred but this time the pain radiated  to the left side of her chest.   PAST MEDICAL HISTORY:  1. Mitral valve prolapse, 1980, confirmed by echocardiogram.  2. Status post diskectomy, now has a prosthesis.  3. Status post hysterectomy in 1999 for endometriosis.  4. Status post bilateral carpal tunnel surgery in 1992.   CURRENT MEDICATIONS:  Nothing regular.   ALLERGIES:  HALDOL, which causes seizures, and DEMEROL, which causes hives.   SOCIAL HISTORY:  Married, has two offspring who are both alive and well,  works as an Airline pilot, is an ex-smoker, used to smoke 3/4 packs a day but  quit one year ago.  Drinks alcohol occasionally, denies drug abuse.   FAMILY HISTORY:  Father had an MI at the age of 41 years.  He was also a  diabetic and had throat cancer.  One of her uncles has coronary artery  disease, as  does her paternal granddad.  Her maternal granddad had lung  cancer.   REVIEW OF SYSTEMS:  Essentially as per HPI apart from belching and bloating.   PHYSICAL EXAMINATION:  VITAL SIGNS:  Temperature 99.3, pulse 104 per minute,  respirations 24, blood pressure initially 158/93.  Later it became 111/77.  GENERAL:  She appeared comfortable after sublingual NTG and GI cocktail  given in the emergency room.  Not short of breath at rest.  Communicative  and cooperative.  HEENT:  No clinical pallor, no jaundice, no conjunctival injection.  Throat  was clear.  NECK:  Supple.  No palpable lymphadenopathy.  No palpable goiter, JVP not  seen.  CHEST:  Clinically clear.  No wheezes, no crackles.  HEART:  Heart sounds 1 and 2 normal, regular, no  murmurs.  ABDOMEN:  Mildly overweight, soft, nontender.  No palpable organomegaly.  Normal bowel sounds.  EXTREMITIES:  No extremity examination, unremarkable.   INVESTIGATIONS:  EKG:  Sinus rhythm, regular, normal axis or Q waves in V1  and V2.  No acute ischemic changes.  LABS:  CBC:  WBC 6.4, hemoglobin 13.0, hematocrit 36.9, platelets 306.  Chemistry:  Sodium 138, potassium 3.7, chloride 109, carbonate 24, BUN 8,  creatinine 0.7, glucose 99.  LFTs normal.  Total CK 118, CK-MB 1.0, troponin  I less than 0.01.   ASSESSMENT/PLAN:  Admit to telemetry unit.  Atypical chest pain/history of  mitral valve prolapse.  Rule out gastroesophageal reflux disease.  In view  of risk factors, will need to rule out coronary artery disease.  Will do  myocardial infarction protocol with cardiac enzymes and EKGs, request 2-D  echocardiogram.  Do lipid profile and TSH.  Request cardiology consult from  the [facility] [facility].  In the interim, will commence with proton pump  inhibitor, p.r.n. sublingual nitroglycerin, and aspirin over the next q.12  hours.  Subsequent management will depend on clinical course.                                               Isidor Holts    CO/MEDQ  D:  07/22/2002  T:  07/22/2002  Job:  [id number]   cc:   Fredirick Maudlin, M.D.

## 2011-05-28 ENCOUNTER — Encounter (HOSPITAL_BASED_OUTPATIENT_CLINIC_OR_DEPARTMENT_OTHER): Payer: 59 | Admitting: Oncology

## 2011-05-28 ENCOUNTER — Other Ambulatory Visit: Payer: Self-pay | Admitting: Oncology

## 2011-05-28 DIAGNOSIS — Z853 Personal history of malignant neoplasm of breast: Secondary | ICD-10-CM

## 2011-05-28 DIAGNOSIS — C50219 Malignant neoplasm of upper-inner quadrant of unspecified female breast: Secondary | ICD-10-CM

## 2011-05-28 DIAGNOSIS — Z171 Estrogen receptor negative status [ER-]: Secondary | ICD-10-CM

## 2011-05-28 LAB — CBC WITH DIFFERENTIAL/PLATELET
BASO%: 0.9 % (ref 0.0–2.0)
Basophils Absolute: 0.1 10*3/uL (ref 0.0–0.1)
EOS%: 2.1 % (ref 0.0–7.0)
Eosinophils Absolute: 0.2 10*3/uL (ref 0.0–0.5)
HCT: 40.9 % (ref 34.8–46.6)
HGB: 14.1 g/dL (ref 11.6–15.9)
LYMPH%: 42 % (ref 14.0–49.7)
MCH: 31 pg (ref 25.1–34.0)
MCHC: 34.5 g/dL (ref 31.5–36.0)
MCV: 89.7 fL (ref 79.5–101.0)
MONO#: 0.5 10*3/uL (ref 0.1–0.9)
MONO%: 4.6 % (ref 0.0–14.0)
NEUT#: 4.9 10*3/uL (ref 1.5–6.5)
NEUT%: 50.4 % (ref 38.4–76.8)
Platelets: 271 10*3/uL (ref 145–400)
RBC: 4.55 10*6/uL (ref 3.70–5.45)
RDW: 12.6 % (ref 11.2–14.5)
WBC: 9.7 10*3/uL (ref 3.9–10.3)
lymph#: 4.1 10*3/uL — ABNORMAL HIGH (ref 0.9–3.3)

## 2011-05-29 LAB — COMPREHENSIVE METABOLIC PANEL
ALT: 14 U/L (ref 0–35)
AST: 22 U/L (ref 0–37)
Albumin: 4.5 g/dL (ref 3.5–5.2)
Alkaline Phosphatase: 98 U/L (ref 39–117)
BUN: 15 mg/dL (ref 6–23)
CO2: 22 mEq/L (ref 19–32)
Calcium: 9.8 mg/dL (ref 8.4–10.5)
Chloride: 104 mEq/L (ref 96–112)
Creatinine, Ser: 0.78 mg/dL (ref 0.50–1.10)
Glucose, Bld: 93 mg/dL (ref 70–99)
Potassium: 4.1 mEq/L (ref 3.5–5.3)
Sodium: 141 mEq/L (ref 135–145)
Total Bilirubin: 0.6 mg/dL (ref 0.3–1.2)
Total Protein: 7.1 g/dL (ref 6.0–8.3)

## 2011-05-29 LAB — LACTATE DEHYDROGENASE: LDH: 164 U/L (ref 94–250)

## 2011-05-29 LAB — CANCER ANTIGEN 27.29: CA 27.29: 34 U/mL (ref 0–39)

## 2011-05-29 LAB — VITAMIN D 25 HYDROXY (VIT D DEFICIENCY, FRACTURES): Vit D, 25-Hydroxy: 36 ng/mL (ref 30–89)

## 2011-06-28 ENCOUNTER — Ambulatory Visit (INDEPENDENT_AMBULATORY_CARE_PROVIDER_SITE_OTHER): Payer: Managed Care, Other (non HMO) | Admitting: Psychology

## 2011-06-28 DIAGNOSIS — F411 Generalized anxiety disorder: Secondary | ICD-10-CM

## 2011-07-11 ENCOUNTER — Ambulatory Visit (INDEPENDENT_AMBULATORY_CARE_PROVIDER_SITE_OTHER): Payer: Managed Care, Other (non HMO) | Admitting: Psychology

## 2011-07-11 DIAGNOSIS — F411 Generalized anxiety disorder: Secondary | ICD-10-CM

## 2011-07-27 ENCOUNTER — Ambulatory Visit (INDEPENDENT_AMBULATORY_CARE_PROVIDER_SITE_OTHER): Payer: Managed Care, Other (non HMO) | Admitting: Psychology

## 2011-07-27 DIAGNOSIS — F411 Generalized anxiety disorder: Secondary | ICD-10-CM

## 2011-08-07 ENCOUNTER — Encounter: Payer: Self-pay | Admitting: Orthopedic Surgery

## 2011-08-07 ENCOUNTER — Ambulatory Visit (INDEPENDENT_AMBULATORY_CARE_PROVIDER_SITE_OTHER): Payer: Managed Care, Other (non HMO) | Admitting: Orthopedic Surgery

## 2011-08-07 DIAGNOSIS — IMO0002 Reserved for concepts with insufficient information to code with codable children: Secondary | ICD-10-CM

## 2011-08-07 DIAGNOSIS — M1710 Unilateral primary osteoarthritis, unspecified knee: Secondary | ICD-10-CM | POA: Insufficient documentation

## 2011-08-07 DIAGNOSIS — M23329 Other meniscus derangements, posterior horn of medial meniscus, unspecified knee: Secondary | ICD-10-CM

## 2011-08-07 NOTE — Progress Notes (Signed)
Chief complaint: LEFT knee pain and swelling HPI:(58) 49 year old female status post RIGHT tennis elbow release status post aspiration injection LEFT knee presents with complaints of recurrent knee effusions which resolved with rest but are exacerbated by increased activity.  She has medial joint line pain occasional mechanical symptoms.  ROS:(1) As noted above musculoskeletal positive     Physical Exam(6) GENERAL: normal development   CDV: pulses are normal   Skin: normal  Psychiatric: awake, alert and oriented  Neuro: normal sensation  MSK Ambulation is normal 1 Tenderness over the medial joint line of the LEFT knee 2 Full passive range of motion 3 Normal strength 4 No instability #5 McMurray sign is positive, hyperflexion test is positive, screw home test is negative.  Imaging: X-rays of the knee show medial joint space narrowing compared to the lateral joint space with mild varus alignment   Assessment: Osteoarthritis LEFT knee, medial meniscal tear posterior horn and arrangement    Plan: Inject LEFT knee consider arthroscopic surgery  X-ray report 3 views of the LEFT knee  Reason pain swelling  Findings: Alignment mild varus.  Joint spaces medial joint space narrowing.  Bone is normal.  No fracture.  Impression mild varus osteoarthritis LEFT knee  Injection LEFT knee: Knee  Injection Procedure Note  Pre-operative Diagnosis: left knee oa  Post-operative Diagnosis: same  Indications: pain  Anesthesia: ethyl chloride   Procedure Details   Verbal consent was obtained for the procedure. Time out was completed.The joint was prepped with alcohol, followed by  Ethyl chloride spray and A 20 gauge needle was inserted into the knee via lateral approach; 4ml 1% lidocaine and 1 ml of depomedrol  was then injected into the joint . The needle was removed and the area cleansed and dressed.  Complications:  None; patient tolerated the procedure well.   Knee arthrospcopy  lit given from [facility]   She will consider arthroscopic knee surgery and good back to [place]

## 2011-08-07 NOTE — Patient Instructions (Addendum)
You have received a steroid shot. 15% of patients experience increased pain at the injection site with in the next 24 hours. This is best treated with ice and tylenol extra strength 2 tabs every 8 hours. If you are still having pain please call the office.   Ice as needed   Consider knee arthroscopy, call us when ready

## 2011-08-10 ENCOUNTER — Ambulatory Visit: Payer: Managed Care, Other (non HMO) | Admitting: Psychology

## 2011-09-19 LAB — BASIC METABOLIC PANEL
BUN: 9
CO2: 28
Calcium: 10.3
Chloride: 104
Creatinine, Ser: 0.67
GFR calc Af Amer: >60
GFR calc non Af Amer: >60
Glucose, Bld: 95
Potassium: 4.7
Sodium: 139

## 2011-09-19 LAB — CBC
HCT: 44.4
Hemoglobin: 15
MCHC: 33.8
MCV: 92.1
Platelets: 292
RBC: 4.82
RDW: 12.5
WBC: 9.3

## 2011-10-01 LAB — CARDIAC PANEL(CRET KIN+CKTOT+MB+TROPI)
CK, MB: 1.7
CK, MB: 1.9
Relative Index: 0.6
Relative Index: 0.6
Total CK: 290 — ABNORMAL HIGH
Total CK: 298 — ABNORMAL HIGH
Troponin I: 0.01
Troponin I: <0.01

## 2011-10-01 LAB — D-DIMER, QUANTITATIVE: D-Dimer, Quant: <0.22

## 2011-10-02 LAB — BASIC METABOLIC PANEL
BUN: 14
CO2: 26
Calcium: 8.6
Chloride: 101
Creatinine, Ser: 0.79
GFR calc Af Amer: >60
GFR calc non Af Amer: >60
Glucose, Bld: 146 — ABNORMAL HIGH
Potassium: 4.6
Sodium: 134 — ABNORMAL LOW

## 2011-10-02 LAB — CARDIAC PANEL(CRET KIN+CKTOT+MB+TROPI)
CK, MB: 2
Relative Index: 0.6
Total CK: 320 — ABNORMAL HIGH
Troponin I: 0.01

## 2011-10-02 LAB — CBC
HCT: 40.3
Hemoglobin: 14
MCHC: 34.6
MCV: 89.4
Platelets: 284
RBC: 4.51
RDW: 12.6
WBC: 9.8

## 2012-02-22 ENCOUNTER — Telehealth: Payer: Self-pay | Admitting: Oncology

## 2012-02-22 ENCOUNTER — Other Ambulatory Visit: Payer: Self-pay | Admitting: *Deleted

## 2012-02-22 ENCOUNTER — Encounter: Payer: Self-pay | Admitting: *Deleted

## 2012-02-22 DIAGNOSIS — C50919 Malignant neoplasm of unspecified site of unspecified female breast: Secondary | ICD-10-CM

## 2012-02-22 HISTORY — DX: Malignant neoplasm of unspecified site of unspecified female breast: C50.919

## 2012-02-22 NOTE — Telephone Encounter (Signed)
lmonvm adviisng the pt of her mammo appt at [facility] for [facility]

## 2012-02-29 ENCOUNTER — Other Ambulatory Visit: Payer: Self-pay | Admitting: Orthopedic Surgery

## 2012-03-06 ENCOUNTER — Telehealth: Payer: Self-pay | Admitting: Orthopedic Surgery

## 2012-03-06 ENCOUNTER — Ambulatory Visit: Payer: Managed Care, Other (non HMO)

## 2012-03-06 NOTE — Telephone Encounter (Signed)
Rachel Sutton left a message asking for the nurse to call her at [phone]  Ext [phone]

## 2012-03-07 NOTE — Telephone Encounter (Signed)
I called the patient and scheduled an appointment

## 2012-03-13 ENCOUNTER — Ambulatory Visit
Admission: RE | Admit: 2012-03-13 | Discharge: 2012-03-13 | Disposition: A | Discharge status: Home / Self Care | Payer: Managed Care, Other (non HMO) | Source: Ambulatory Visit | Attending: Oncology | Admitting: Oncology

## 2012-03-13 DIAGNOSIS — C50919 Malignant neoplasm of unspecified site of unspecified female breast: Secondary | ICD-10-CM

## 2012-03-17 ENCOUNTER — Encounter: Payer: Self-pay | Admitting: *Deleted

## 2012-03-24 ENCOUNTER — Ambulatory Visit: Payer: Managed Care, Other (non HMO) | Admitting: Orthopedic Surgery

## 2012-04-09 ENCOUNTER — Ambulatory Visit (INDEPENDENT_AMBULATORY_CARE_PROVIDER_SITE_OTHER): Payer: Managed Care, Other (non HMO) | Admitting: Orthopedic Surgery

## 2012-04-09 ENCOUNTER — Encounter: Payer: Self-pay | Admitting: Orthopedic Surgery

## 2012-04-09 ENCOUNTER — Other Ambulatory Visit: Payer: Self-pay | Admitting: *Deleted

## 2012-04-09 VITALS — BP 90/60 | Ht 64.0 in | Wt 168.0 lb

## 2012-04-09 DIAGNOSIS — M23329 Other meniscus derangements, posterior horn of medial meniscus, unspecified knee: Secondary | ICD-10-CM

## 2012-04-09 MED ORDER — HYDROCODONE-ACETAMINOPHEN 5-325 MG PO TABS: 1.0000 | ORAL_TABLET | ORAL | Status: AC | PRN

## 2012-04-09 NOTE — Progress Notes (Signed)
Patient ID: Rachel Sutton, female   DOB: 02-20-1961, 51 y.o.   MRN: [id number] Chief Complaint  Patient presents with  . Follow-up    Left knee pain.    Previously noted LEFT knee problems with it. Effusions and pain medially suggested that she have knee arthroscopy for medial meniscal tear. She declined. She presents back with recurrent effusion, severe in medial knee pain, and tense effusion today with off and on catching, locking, giving way. Review of systems complete comprehensive review of systems is normal. Other than stated Past Medical History  Diagnosis Date  . Fuchs' corneal dystrophy   . HTN (hypertension)   . High cholesterol   . Breast cancer 02/22/2012    right T1c, N0  . Hx of mitral valve prolapse 1980  . GERD (gastroesophageal reflux disease)     Past Surgical History  Procedure Date  . Breast lumpectomy 10/30/2002    right  . Carpal tunnel r/l 1992  . Ray  cage fusion 1997  . A cervical spine fusion x2 since last visit   . Elbow surgery  tennis elbow release 2012  . Abdominal hysterectomy 1999  . Ovarian cyst removal mid-1990s    prior to hysterectomy  . Sentinel lymph node biopsy 10/30/2002    right    History   Social History  . Marital Status: Married    Spouse Name: N/A    Number of Children: N/A  . Years of Education: N/A   Occupational History  . Not on file.   Social History Main Topics  . Smoking status: Former Smoker -- 1.0 packs/day for 24 years    Types: Cigarettes    Quit date: 06/30/2001  . Smokeless tobacco: Not on file  . Alcohol Use: No  . Drug Use:   . Sexually Active:    Other Topics Concern  . Not on file   Social History Narrative  . No narrative on file    Vital signs are stable as recorded  General appearance is normal  The patient is alert and oriented x3  The patient's mood and affect are normal  Gait assessment: She has a fairly significant limp, favoring the LEFT lower extremity The cardiovascular exam  reveals normal pulses and temperature without edema swelling.  The lymphatic system is negative for palpable lymph nodes  The sensory exam is normal.  There are no pathologic reflexes.  Balance is normal.   Exam of the LEFT knee Inspection Large tense, joint effusion, medial joint line tenderness Range of motion Limited to, 90 with pain Stability Normal Strength Normal Skin Normal McMurray sign, positive medial joint line tender as stated.  RIGHT knee normal range of motion strength, stability alignment.   Upper extremity exam  Inspection and palpation revealed no abnormalities in the upper extremities.  Range of motion is full without contracture.  Motor exam is normal with grade 5 strength.  The joints are fully reduced without subluxation.  There is no atrophy or tremor and muscle tone is normal.  All joints are stable.   #1 LEFT knee joint effusion #2 LEFT knee medial meniscal tear  Plan arthroscopy LEFT knee partial medial meniscectomy

## 2012-04-09 NOTE — Patient Instructions (Signed)
You have received a steroid shot. 15% of patients experience increased pain at the injection site with in the next 24 hours. This is best treated with ice and tylenol extra strength 2 tabs every 8 hours. If you are still having pain please call the office.   PREOP You have been scheduled for surgery. All surgeries carry some risk. Remember you always have the option of continued nonsurgical treatment. However, in this situation. The risks versus the benefits favor surgery is the best treatment option. The risks of surgery include the following, but is not limited to, bleeding, infection, pulmonary embolus. Death from anesthesia, nerve injury, vascular injury, or the need for continued surgery. Continued pain.  Specific to this procedure Arthroscopy LEFT knee, and partial medial meniscectomy Swelling after surgery Recurrent effusions Medial pain after surgery  Stop all anti-inflammatory medicines, and in the medicines that may cause bleeding, which include medicine such as aspirin, Goody powders, ibuprofen, Naprosyn.

## 2012-04-16 ENCOUNTER — Telehealth: Payer: Self-pay | Admitting: Orthopedic Surgery

## 2012-04-16 NOTE — Telephone Encounter (Signed)
YES THAT IS OK

## 2012-04-16 NOTE — Telephone Encounter (Signed)
Rachel Sutton said you wanted her out of work for a week after her knee surgery.  She wants to know if you will give her a note authorizing her to work from home during  That week she is to be out.  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

## 2012-04-17 NOTE — Telephone Encounter (Signed)
Her employer needs specific hours for working at home,  She says she can work 6 hours a day in 2 hour intervals with a break in between If you will OK. She did not know this piece of information at first call

## 2012-04-17 NOTE — Telephone Encounter (Signed)
These guidelines are proved

## 2012-04-21 ENCOUNTER — Encounter: Payer: Self-pay | Admitting: Orthopedic Surgery

## 2012-04-21 NOTE — Telephone Encounter (Signed)
The patient is aware. Will pick up the note

## 2012-04-24 ENCOUNTER — Encounter (HOSPITAL_COMMUNITY): Payer: Self-pay | Admitting: Pharmacy Technician

## 2012-04-25 ENCOUNTER — Telehealth: Payer: Self-pay | Admitting: Orthopedic Surgery

## 2012-04-25 ENCOUNTER — Encounter (HOSPITAL_COMMUNITY): Payer: Managed Care, Other (non HMO)

## 2012-04-25 NOTE — Telephone Encounter (Signed)
Contacted insurer, [name], ph# [phone], regarding outpatient surgery scheduled 05/02/12 at [facility], CPT codes 09811, [phone], ICD9 717.2. Per automated voice response system, no pre-authorization is required for in-network providers.  I have left message for a call back from a representative to further verify.

## 2012-04-28 ENCOUNTER — Encounter (HOSPITAL_COMMUNITY): Payer: Self-pay

## 2012-04-28 ENCOUNTER — Encounter (HOSPITAL_COMMUNITY)
Admission: RE | Admit: 2012-04-28 | Discharge: 2012-04-28 | Disposition: A | Discharge status: Home / Self Care | Payer: Managed Care, Other (non HMO) | Source: Ambulatory Visit | Attending: Orthopedic Surgery | Admitting: Orthopedic Surgery

## 2012-04-28 HISTORY — DX: Anxiety disorder, unspecified: F41.9

## 2012-04-28 HISTORY — DX: Unspecified osteoarthritis, unspecified site: M19.90

## 2012-04-28 LAB — BASIC METABOLIC PANEL
BUN: 25 mg/dL — ABNORMAL HIGH (ref 6–23)
CO2: 25 mEq/L (ref 19–32)
Calcium: 10 mg/dL (ref 8.4–10.5)
Chloride: 104 mEq/L (ref 96–112)
Creatinine, Ser: 0.78 mg/dL (ref 0.50–1.10)
GFR calc Af Amer: >90 mL/min (ref >90)
GFR calc non Af Amer: >90 mL/min (ref >90)
Glucose, Bld: 105 mg/dL — ABNORMAL HIGH (ref 70–99)
Potassium: 4.1 mEq/L (ref 3.5–5.1)
Sodium: 141 mEq/L (ref 135–145)

## 2012-04-28 LAB — HEMOGLOBIN AND HEMATOCRIT, BLOOD
HCT: 40.1 % (ref 36.0–46.0)
Hemoglobin: 14.3 g/dL (ref 12.0–15.0)

## 2012-04-28 LAB — SURGICAL PCR SCREEN
MRSA, PCR: NEGATIVE
Staphylococcus aureus: POSITIVE — AB

## 2012-04-28 NOTE — Patient Instructions (Signed)
20 Rachel Sutton  04/28/2012   Your procedure is scheduled on:  5.17.13  Report to [facility] at [place] [facility].  Call this number if you have problems the morning of surgery: [phone]   Remember:   Do not eat food:After Midnight.  May have clear liquids:until Midnight .  Clear liquids include soda, tea, black coffee, apple or grape juice, broth.  Take these medicines the morning of surgery with A SIP OF WATER: klonopin, bystolic, pain pill if needed   Do not wear jewelry, make-up or nail polish.  Do not wear lotions, powders, or perfumes. You may wear deodorant.  Do not shave 48 hours prior to surgery.  Do not bring valuables to the hospital.  Contacts, dentures or bridgework may not be worn into surgery.  Leave suitcase in the car. After surgery it may be brought to your room.  For patients admitted to the hospital, checkout time is 11:00 AM the day of discharge.   Patients discharged the day of surgery will not be allowed to drive home.  Name and phone number of your driver: family  Special Instructions: CHG Shower Use Special Wash: 1/2 bottle night before surgery and 1/2 bottle morning of surgery.   Please read over the following fact sheets that you were given: Pain Booklet, MRSA Information, Surgical Site Infection Prevention, Anesthesia Post-op Instructions and Care and Recovery After Surgery   PATIENT INSTRUCTIONS POST-ANESTHESIA  IMMEDIATELY FOLLOWING SURGERY:  Do not drive or operate machinery for the first twenty four hours after surgery.  Do not make any important decisions for twenty four hours after surgery or while taking narcotic pain medications or sedatives.  If you develop intractable nausea and vomiting or a severe headache please notify your doctor immediately.  FOLLOW-UP:  Please make an appointment with your surgeon as instructed. You do not need to follow up with anesthesia unless specifically instructed to do so.  WOUND CARE INSTRUCTIONS (if applicable):  Keep a  dry clean dressing on the anesthesia/puncture wound site if there is drainage.  Once the wound has quit draining you may leave it open to air.  Generally you should leave the bandage intact for twenty four hours unless there is drainage.  If the epidural site drains for more than 36-48 hours please call the anesthesia department.  QUESTIONS?:  Please feel free to call your physician or the hospital operator if you have any questions, and they will be happy to assist you.         Arthroscopic Procedure, Knee An arthroscopic procedure can find what is wrong with your knee. PROCEDURE Arthroscopy is a surgical technique that allows your orthopedic surgeon to diagnose and treat your knee injury with accuracy. They will look into your knee through a small instrument. This is almost like a small (pencil sized) telescope. Because arthroscopy affects your knee less than open knee surgery, you can anticipate a more rapid recovery. Taking an active role by following your caregiver's instructions will help with rapid and complete recovery. Use crutches, rest, elevation, ice, and knee exercises as instructed. The length of recovery depends on various factors including type of injury, age, physical condition, medical conditions, and your rehabilitation. Your knee is the joint between the large bones (femur and tibia) in your leg. Cartilage covers these bone ends which are smooth and slippery and allow your knee to bend and move smoothly. Two menisci, thick, semi-lunar shaped pads of cartilage which form a rim inside the joint, help absorb shock and  stabilize your knee. Ligaments bind the bones together and support your knee joint. Muscles move the joint, help support your knee, and take stress off the joint itself. Because of this all programs and physical therapy to rehabilitate an injured or repaired knee require rebuilding and strengthening your muscles. AFTER THE PROCEDURE  After the procedure, you will be  moved to a recovery area until most of the effects of the medication have worn off. Your caregiver will discuss the test results with you.   Only take over-the-counter or prescription medicines for pain, discomfort, or fever as directed by your caregiver.  SEEK MEDICAL CARE IF:   You have increased bleeding from your wounds.   You see redness, swelling, or have increasing pain in your wounds.   You have pus coming from your wound.   You have an oral temperature above 102 F (38.9 C).   You notice a bad smell coming from the wound or dressing.   You have severe pain with any motion of your knee.  SEEK IMMEDIATE MEDICAL CARE IF:   You develop a rash.   You have difficulty breathing.   You have any allergic problems.  Document Released: 11/30/2000 Document Revised: 11/22/2011 Document Reviewed: 06/23/2008 [facility] Patient Information 2012 [place], [place].

## 2012-04-28 NOTE — Telephone Encounter (Addendum)
Received Ref# [id number], per automated voice message system.  A fax of patient's benefits is to follow if further information on patient's plan is needed.  Received [employer]'s fax with the same REF# [phone].

## 2012-05-01 NOTE — H&P (Signed)
Chief Complaint   Patient presents with   .  Follow-up     Left knee pain.    Previously noted LEFT knee problems with it. Effusions and pain medially suggested that she have knee arthroscopy for medial meniscal tear. She declined. She presents back with recurrent effusion, severe in medial knee pain, and tense effusion today with off and on catching, locking, giving way.  Review of systems complete comprehensive review of systems is normal. Other than stated  Past Medical History   Diagnosis  Date   .  Fuchs' corneal dystrophy    .  HTN (hypertension)    .  High cholesterol    .  Breast cancer  02/22/2012     right T1c, N0   .  Hx of mitral valve prolapse  1980   .  GERD (gastroesophageal reflux disease)     Past Surgical History   Procedure  Date   .  Breast lumpectomy  10/30/2002     right   .  Carpal tunnel r/l  1992   .  Ray cage fusion  1997   .  A cervical spine fusion x2 since last visit    .  Elbow surgery  tennis elbow release 2012   .  Abdominal hysterectomy  1999   .  Ovarian cyst removal  mid-1990s     prior to hysterectomy   .  Sentinel lymph node biopsy  10/30/2002     right    History    Social History   .  Marital Status:  Married     Spouse Name:  N/A     Number of Children:  N/A   .  Years of Education:  N/A    Occupational History   .  Not on file.    Social History Main Topics   .  Smoking status:  Former Smoker -- 1.0 packs/day for 24 years     Types:  Cigarettes     Quit date:  06/30/2001   .  Smokeless tobacco:  Not on file   .  Alcohol Use:  No   .  Drug Use:    .  Sexually Active:     Other Topics  Concern   .  Not on file    Social History Narrative   .  No narrative on file    Vital signs are stable as recorded  General appearance is normal  The patient is alert and oriented x3  The patient's mood and affect are normal  Gait assessment: She has a fairly significant limp, favoring the LEFT lower extremity  The cardiovascular  exam reveals normal pulses and temperature without edema swelling.  The lymphatic system is negative for palpable lymph nodes  The sensory exam is normal.  There are no pathologic reflexes.  Balance is normal.  Exam of the LEFT knee  Inspection Large tense, joint effusion, medial joint line tenderness  Range of motion Limited to, 90 with pain  Stability Normal  Strength Normal  Skin Normal  McMurray sign, positive medial joint line tender as stated.  RIGHT knee normal range of motion strength, stability alignment.  Upper extremity exam  Inspection and palpation revealed no abnormalities in the upper extremities. Range of motion is full without contracture.  Motor exam is normal with grade 5 strength.  The joints are fully reduced without subluxation.  There is no atrophy or tremor and muscle tone is normal. All joints are stable.   #1  LEFT knee joint effusion  #2 LEFT knee medial meniscal tear   Plan arthroscopy LEFT knee partial medial meniscectomy

## 2012-05-02 ENCOUNTER — Encounter (HOSPITAL_COMMUNITY)
Admission: RE | Disposition: A | Discharge status: Home / Self Care | Payer: Self-pay | Source: Ambulatory Visit | Attending: Orthopedic Surgery

## 2012-05-02 ENCOUNTER — Encounter (HOSPITAL_COMMUNITY): Payer: Self-pay | Admitting: Anesthesiology

## 2012-05-02 ENCOUNTER — Ambulatory Visit (HOSPITAL_COMMUNITY): Payer: Managed Care, Other (non HMO) | Admitting: Anesthesiology

## 2012-05-02 ENCOUNTER — Encounter (HOSPITAL_COMMUNITY): Payer: Self-pay | Admitting: *Deleted

## 2012-05-02 ENCOUNTER — Ambulatory Visit (HOSPITAL_COMMUNITY)
Admission: RE | Admit: 2012-05-02 | Discharge: 2012-05-02 | Disposition: A | Discharge status: Home / Self Care | Payer: Managed Care, Other (non HMO) | Source: Ambulatory Visit | Attending: Orthopedic Surgery | Admitting: Orthopedic Surgery

## 2012-05-02 DIAGNOSIS — M1710 Unilateral primary osteoarthritis, unspecified knee: Secondary | ICD-10-CM

## 2012-05-02 DIAGNOSIS — Z79899 Other long term (current) drug therapy: Secondary | ICD-10-CM | POA: Insufficient documentation

## 2012-05-02 DIAGNOSIS — M6750 Plica syndrome, unspecified knee: Secondary | ICD-10-CM | POA: Insufficient documentation

## 2012-05-02 DIAGNOSIS — Z01812 Encounter for preprocedural laboratory examination: Secondary | ICD-10-CM | POA: Insufficient documentation

## 2012-05-02 DIAGNOSIS — Z01810 Encounter for preprocedural cardiovascular examination: Secondary | ICD-10-CM | POA: Insufficient documentation

## 2012-05-02 DIAGNOSIS — IMO0002 Reserved for concepts with insufficient information to code with codable children: Secondary | ICD-10-CM

## 2012-05-02 DIAGNOSIS — M6580 Other synovitis and tenosynovitis, unspecified site: Secondary | ICD-10-CM | POA: Insufficient documentation

## 2012-05-02 DIAGNOSIS — I10 Essential (primary) hypertension: Secondary | ICD-10-CM | POA: Insufficient documentation

## 2012-05-02 DIAGNOSIS — E7800 Pure hypercholesterolemia, unspecified: Secondary | ICD-10-CM | POA: Insufficient documentation

## 2012-05-02 DIAGNOSIS — M25469 Effusion, unspecified knee: Secondary | ICD-10-CM | POA: Insufficient documentation

## 2012-05-02 SURGERY — ARTHROSCOPY, KNEE, WITH PLICA EXCISION
Anesthesia: General | Site: Knee | Laterality: Left | Wound class: Clean

## 2012-05-02 MED ORDER — ONDANSETRON HCL 4 MG/2ML IJ SOLN: 4.0000 mg | Freq: Once | INTRAMUSCULAR | Status: DC | PRN

## 2012-05-02 MED ORDER — HYDROCODONE-ACETAMINOPHEN 7.5-325 MG PO TABS: 1.0000 | ORAL_TABLET | ORAL | Status: AC | PRN

## 2012-05-02 MED ORDER — EPINEPHRINE HCL 1 MG/ML IJ SOLN
INTRAMUSCULAR | Status: AC
  Filled 2012-05-02: qty 5

## 2012-05-02 MED ORDER — CEFAZOLIN SODIUM 1-5 GM-% IV SOLN
INTRAVENOUS | Status: AC
  Filled 2012-05-02: qty 50

## 2012-05-02 MED ORDER — ACETAMINOPHEN 10 MG/ML IV SOLN
1000.0000 mg | Freq: Once | INTRAVENOUS | Status: AC
  Administered 2012-05-02: 1000 mg via INTRAVENOUS

## 2012-05-02 MED ORDER — LIDOCAINE HCL 1 % IJ SOLN
INTRAMUSCULAR | Status: DC | PRN
  Administered 2012-05-02: 40 mg via INTRADERMAL

## 2012-05-02 MED ORDER — ACETAMINOPHEN 10 MG/ML IV SOLN
INTRAVENOUS | Status: AC
  Administered 2012-05-02: 1000 mg via INTRAVENOUS
  Filled 2012-05-02: qty 100

## 2012-05-02 MED ORDER — KETOROLAC TROMETHAMINE 30 MG/ML IJ SOLN
30.0000 mg | Freq: Once | INTRAMUSCULAR | Status: AC
  Administered 2012-05-02: 30 mg via INTRAVENOUS

## 2012-05-02 MED ORDER — MIDAZOLAM HCL 2 MG/2ML IJ SOLN
1.0000 mg | INTRAMUSCULAR | Status: DC | PRN
  Administered 2012-05-02: 2 mg via INTRAVENOUS

## 2012-05-02 MED ORDER — PROPOFOL 10 MG/ML IV EMUL
INTRAVENOUS | Status: AC
  Filled 2012-05-02: qty 20

## 2012-05-02 MED ORDER — GLYCOPYRROLATE 0.2 MG/ML IJ SOLN
INTRAMUSCULAR | Status: AC
  Administered 2012-05-02: 0.2 mg via INTRAVENOUS
  Filled 2012-05-02: qty 1

## 2012-05-02 MED ORDER — FENTANYL CITRATE 0.05 MG/ML IJ SOLN
INTRAMUSCULAR | Status: AC
  Administered 2012-05-02: 25 ug via INTRAVENOUS
  Filled 2012-05-02: qty 5

## 2012-05-02 MED ORDER — FENTANYL CITRATE 0.05 MG/ML IJ SOLN
INTRAMUSCULAR | Status: DC | PRN
  Administered 2012-05-02: 25 ug via INTRAVENOUS
  Administered 2012-05-02: 50 ug via INTRAVENOUS
  Administered 2012-05-02: 25 ug via INTRAVENOUS
  Administered 2012-05-02 (×2): 50 ug via INTRAVENOUS

## 2012-05-02 MED ORDER — CELECOXIB 100 MG PO CAPS
400.0000 mg | ORAL_CAPSULE | Freq: Once | ORAL | Status: AC
  Administered 2012-05-02: 400 mg via ORAL

## 2012-05-02 MED ORDER — FENTANYL CITRATE 0.05 MG/ML IJ SOLN
INTRAMUSCULAR | Status: AC
  Filled 2012-05-02: qty 2

## 2012-05-02 MED ORDER — KETOROLAC TROMETHAMINE 30 MG/ML IJ SOLN
INTRAMUSCULAR | Status: AC
  Administered 2012-05-02: 30 mg via INTRAVENOUS
  Filled 2012-05-02: qty 1

## 2012-05-02 MED ORDER — ACETAMINOPHEN 325 MG PO TABS: 325.0000 mg | ORAL_TABLET | ORAL | Status: DC | PRN

## 2012-05-02 MED ORDER — TRAMADOL HCL 50 MG PO TABS
50.0000 mg | ORAL_TABLET | Freq: Four times a day (QID) | ORAL | Status: DC
  Administered 2012-05-02: 50 mg via ORAL

## 2012-05-02 MED ORDER — CEFAZOLIN SODIUM 1-5 GM-% IV SOLN: 1.0000 g | INTRAVENOUS | Status: DC

## 2012-05-02 MED ORDER — SODIUM CHLORIDE 0.9 % IR SOLN
Status: DC | PRN
  Administered 2012-05-02: 1000 mL

## 2012-05-02 MED ORDER — GLYCOPYRROLATE 0.2 MG/ML IJ SOLN
0.2000 mg | Freq: Once | INTRAMUSCULAR | Status: AC
  Administered 2012-05-02: 0.2 mg via INTRAVENOUS

## 2012-05-02 MED ORDER — ONDANSETRON HCL 4 MG/2ML IJ SOLN
INTRAMUSCULAR | Status: AC
  Administered 2012-05-02: 4 mg via INTRAVENOUS
  Filled 2012-05-02: qty 2

## 2012-05-02 MED ORDER — PROMETHAZINE HCL 12.5 MG PO TABS: 12.5000 mg | ORAL_TABLET | Freq: Four times a day (QID) | ORAL | Status: DC | PRN

## 2012-05-02 MED ORDER — BUPIVACAINE-EPINEPHRINE PF 0.5-1:200000 % IJ SOLN
INTRAMUSCULAR | Status: AC
  Filled 2012-05-02: qty 20

## 2012-05-02 MED ORDER — TRAMADOL HCL 50 MG PO TABS
ORAL_TABLET | ORAL | Status: AC
  Administered 2012-05-02: 50 mg via ORAL
  Filled 2012-05-02: qty 1

## 2012-05-02 MED ORDER — PROPOFOL 10 MG/ML IV BOLUS
INTRAVENOUS | Status: DC | PRN
  Administered 2012-05-02: 150 mg via INTRAVENOUS

## 2012-05-02 MED ORDER — ONDANSETRON HCL 4 MG/2ML IJ SOLN
4.0000 mg | Freq: Once | INTRAMUSCULAR | Status: AC
  Administered 2012-05-02: 4 mg via INTRAVENOUS

## 2012-05-02 MED ORDER — LACTATED RINGERS IV SOLN
INTRAVENOUS | Status: DC
  Administered 2012-05-02: 1000 mL via INTRAVENOUS

## 2012-05-02 MED ORDER — MIDAZOLAM HCL 2 MG/2ML IJ SOLN
INTRAMUSCULAR | Status: AC
  Administered 2012-05-02: 2 mg via INTRAVENOUS
  Filled 2012-05-02: qty 2

## 2012-05-02 MED ORDER — CHLORHEXIDINE GLUCONATE 4 % EX LIQD
60.0000 mL | Freq: Once | CUTANEOUS | Status: DC
  Filled 2012-05-02: qty 60

## 2012-05-02 MED ORDER — LIDOCAINE HCL (PF) 1 % IJ SOLN
INTRAMUSCULAR | Status: AC
  Filled 2012-05-02: qty 5

## 2012-05-02 MED ORDER — SODIUM CHLORIDE 0.9 % IR SOLN
Status: DC | PRN
  Administered 2012-05-02 (×5)

## 2012-05-02 MED ORDER — BUPIVACAINE-EPINEPHRINE PF 0.5-1:200000 % IJ SOLN
INTRAMUSCULAR | Status: DC | PRN
  Administered 2012-05-02: 60 mL

## 2012-05-02 MED ORDER — FENTANYL CITRATE 0.05 MG/ML IJ SOLN
25.0000 ug | INTRAMUSCULAR | Status: DC | PRN
  Administered 2012-05-02 (×2): 25 ug via INTRAVENOUS

## 2012-05-02 MED ORDER — CELECOXIB 100 MG PO CAPS
ORAL_CAPSULE | ORAL | Status: AC
  Administered 2012-05-02: 400 mg via ORAL
  Filled 2012-05-02: qty 4

## 2012-05-02 SURGICAL SUPPLY — 53 items
ARTHROWAND PARAGON T2 (SURGICAL WAND)
BAG HAMPER (MISCELLANEOUS) ×3 IMPLANT
BANDAGE ELASTIC 6 VELCRO NS (GAUZE/BANDAGES/DRESSINGS) ×3 IMPLANT
BLADE AGGRESSIVE PLUS 4.0 (BLADE) ×3 IMPLANT
BLADE SURG SZ11 CARB STEEL (BLADE) ×3 IMPLANT
CHLORAPREP W/TINT 26ML (MISCELLANEOUS) ×6 IMPLANT
CLOTH BEACON ORANGE TIMEOUT ST (SAFETY) ×3 IMPLANT
COOLER CRYO IC GRAV AND TUBE (ORTHOPEDIC SUPPLIES) ×3 IMPLANT
CUFF CRYO KNEE LG 20X31 COOLER (ORTHOPEDIC SUPPLIES) IMPLANT
CUFF CRYO KNEE18X23 MED (MISCELLANEOUS) ×2 IMPLANT
CUFF TOURNIQUET SINGLE 34IN LL (TOURNIQUET CUFF) ×2 IMPLANT
CUFF TOURNIQUET SINGLE 44IN (TOURNIQUET CUFF) IMPLANT
CUTTER ANGLED DBL BITE 4.5 (BURR) IMPLANT
DECANTER SPIKE VIAL GLASS SM (MISCELLANEOUS) ×6 IMPLANT
DRSG XEROFORM 1X8 (GAUZE/BANDAGES/DRESSINGS) ×2 IMPLANT
FLOOR PAD 36X40 (MISCELLANEOUS)
GAUZE SPONGE 4X4 16PLY XRAY LF (GAUZE/BANDAGES/DRESSINGS) ×3 IMPLANT
GAUZE XEROFORM 5X9 LF (GAUZE/BANDAGES/DRESSINGS) ×3 IMPLANT
GLOVE SKINSENSE NS SZ8.0 LF (GLOVE) ×1
GLOVE SKINSENSE STRL SZ8.0 LF (GLOVE) ×2 IMPLANT
GLOVE SS N UNI LF 8.5 STRL (GLOVE) ×3 IMPLANT
GOWN STRL REIN XL XLG (GOWN DISPOSABLE) ×7 IMPLANT
HLDR LEG FOAM (MISCELLANEOUS) ×2 IMPLANT
IV NS IRRIG 3000ML ARTHROMATIC (IV SOLUTION) ×12 IMPLANT
KIT BLADEGUARD II DBL (SET/KITS/TRAYS/PACK) ×3 IMPLANT
KIT ROOM TURNOVER AP CYSTO (KITS) ×3 IMPLANT
LEG HOLDER FOAM (MISCELLANEOUS) ×1
MANIFOLD NEPTUNE II (INSTRUMENTS) ×3 IMPLANT
MARKER SKIN DUAL TIP RULER LAB (MISCELLANEOUS) ×3 IMPLANT
NDL HYPO 18GX1.5 BLUNT FILL (NEEDLE) ×1 IMPLANT
NDL HYPO 21X1.5 SAFETY (NEEDLE) ×1 IMPLANT
NDL SPNL 18GX3.5 QUINCKE PK (NEEDLE) ×1 IMPLANT
NEEDLE HYPO 18GX1.5 BLUNT FILL (NEEDLE) ×3 IMPLANT
NEEDLE HYPO 21X1.5 SAFETY (NEEDLE) ×3 IMPLANT
NEEDLE SPNL 18GX3.5 QUINCKE PK (NEEDLE) ×3 IMPLANT
NS IRRIG 1000ML POUR BTL (IV SOLUTION) ×3 IMPLANT
PACK ARTHRO LIMB DRAPE STRL (MISCELLANEOUS) ×3 IMPLANT
PAD ABD 5X9 TENDERSORB (GAUZE/BANDAGES/DRESSINGS) ×3 IMPLANT
PAD ARMBOARD 7.5X6 YLW CONV (MISCELLANEOUS) ×3 IMPLANT
PAD FLOOR 36X40 (MISCELLANEOUS) ×1 IMPLANT
PADDING CAST COTTON 6X4 STRL (CAST SUPPLIES) ×3 IMPLANT
SET ARTHROSCOPY INST (INSTRUMENTS) ×3 IMPLANT
SET ARTHROSCOPY PUMP TUBE (IRRIGATION / IRRIGATOR) ×5 IMPLANT
SET BASIN LINEN APH (SET/KITS/TRAYS/PACK) ×3 IMPLANT
SPONGE GAUZE 4X4 12PLY (GAUZE/BANDAGES/DRESSINGS) ×3 IMPLANT
STRIP CLOSURE SKIN 1/2X4 (GAUZE/BANDAGES/DRESSINGS) ×3 IMPLANT
SUT ETHILON 3 0 FSL (SUTURE) ×2 IMPLANT
SYR 30ML LL (SYRINGE) ×3 IMPLANT
SYRINGE 10CC LL (SYRINGE) ×3 IMPLANT
WAND 50 DEG COVAC W/CORD (SURGICAL WAND) IMPLANT
WAND 90 DEG TURBOVAC W/CORD (SURGICAL WAND) ×2 IMPLANT
WAND ARTHRO PARAGON T2 (SURGICAL WAND) IMPLANT
YANKAUER SUCT BULB TIP 10FT TU (MISCELLANEOUS) ×11 IMPLANT

## 2012-05-02 NOTE — Interval H&P Note (Signed)
History and Physical Interval Note:  05/02/2012 7:20 AM  Rachel Sutton  has presented today for surgery, with the diagnosis of left knee meniscus tear  The various methods of treatment have been discussed with the patient and family. After consideration of risks, benefits and other options for treatment, the patient has consented to  Procedure(s) (LRB): KNEE ARTHROSCOPY WITH MEDIAL MENISECTOMY (Left) as a surgical intervention .  The patients' history has been reviewed, patient examined, no change in status, stable for surgery.  I have reviewed the patients' chart and labs.  Questions were answered to the patient's satisfaction.     Fuller Canada

## 2012-05-02 NOTE — Transfer of Care (Signed)
Immediate Anesthesia Transfer of Care Note  Patient: Rachel Sutton  Procedure(s) Performed: Procedure(s) (LRB): KNEE ARTHROSCOPY WITH EXCISION PLICA (Left)  Patient Location: PACU  Anesthesia Type: General  Level of Consciousness: awake, alert  and oriented  Airway & Oxygen Therapy: Patient Spontanous Breathing and Patient connected to face mask oxygen  Post-op Assessment: Report given to PACU RN  Post vital signs: Reviewed and stable  Complications: No apparent anesthesia complications

## 2012-05-02 NOTE — Anesthesia Procedure Notes (Signed)
Procedure Name: LMA Insertion Date/Time: 05/02/2012 7:40 AM Performed by: Glynn Octave E Pre-anesthesia Checklist: Patient identified, Patient being monitored, Emergency Drugs available, Timeout performed and Suction available Patient Re-evaluated:Patient Re-evaluated prior to inductionOxygen Delivery Method: Circle System Utilized Preoxygenation: Pre-oxygenation with 100% oxygen Intubation Type: IV induction Ventilation: Mask ventilation without difficulty LMA: LMA inserted LMA Size: 3.0 Number of attempts: 1 Placement Confirmation: positive ETCO2 and breath sounds checked- equal and bilateral

## 2012-05-02 NOTE — Interval H&P Note (Signed)
History and Physical Interval Note:  05/02/2012 7:20 AM  Rachel Sutton  has presented today for surgery, with the diagnosis of left knee meniscus tear  The various methods of treatment have been discussed with the patient and family. After consideration of risks, benefits and other options for treatment, the patient has consented to  Procedure(s) (LRB): KNEE ARTHROSCOPY WITH MEDIAL MENISECTOMY (Left) as a surgical intervention .  The patients' history has been reviewed, patient examined, no change in status, stable for surgery.  I have reviewed the patients' chart and labs.  Questions were answered to the patient's satisfaction.     Sanchez Hemmer   

## 2012-05-02 NOTE — Brief Op Note (Signed)
05/02/2012  8:41 AM  PATIENT:  Rachel Sutton  51 y.o. female  PRE-OPERATIVE DIAGNOSIS:  left knee meniscus tear  POST-OPERATIVE DIAGNOSIS:  left knee synovitis and medial synovial plica PROCEDURE:  Procedure(s) (LRB): KNEE ARTHROSCOPY WITH EXCISION PLICA (Left) knee + synovectomy  FINDINGS: Medial joint surfaces mild grade 1 chondral changes. Notch anterior cruciate ligament and PCL were intact with moderate synovitis. Lateral compartment cartilaginous surfaces intact without chondral changes meniscus intact. Moderate synovitis. Patellofemoral joint severe synovitis. Medial synovial plica.  SURGEON:  Surgeon(s) and Role:    * Vickki Hearing, MD - Primary  PHYSICIAN ASSISTANT:   ASSISTANTS: none   ANESTHESIA:   general, LMA  EBL:  Total I/O In: 100 [I.V.:100] Out: 0   BLOOD ADMINISTERED:none  DRAINS: none   LOCAL MEDICATIONS USED:  MARCAINE, Amount: 60 ml and OTHER EPI  SPECIMEN:  No Specimen  DISPOSITION OF SPECIMEN:  N/A  COUNTS:  YES  TOURNIQUET:  * Missing tourniquet times found for documented tourniquets in log:  16109 *  DICTATION: .Dragon Dictation  PLAN OF CARE: Home after PACU  PATIENT DISPOSITION:  PACU - hemodynamically stable.   Delay start of Pharmacological VTE agent (>24hrs) due to surgical blood loss or risk of bleeding: not applicable

## 2012-05-02 NOTE — Anesthesia Preprocedure Evaluation (Signed)
Anesthesia Evaluation  Patient identified by MRN, date of birth, ID band Patient awake    Reviewed: Allergy & Precautions, H&P , NPO status , Patient's Chart, lab work & pertinent test results, reviewed documented beta blocker date and time   Airway Mallampati: II TM Distance: >3 FB Neck ROM: Limited    Dental No notable dental hx.    Pulmonary Current Smoker,    Pulmonary exam normal       Cardiovascular hypertension, Pt. on medications and Pt. on home beta blockers Rhythm:Regular Rate:Normal     Neuro/Psych Anxiety negative neurological ROS     GI/Hepatic Neg liver ROS, GERD-  ,  Endo/Other  negative endocrine ROS  Renal/GU negative Renal ROS     Musculoskeletal  (+) Arthritis -,   Abdominal   Peds  Hematology negative hematology ROS (+)   Anesthesia Other Findings   Reproductive/Obstetrics                           Anesthesia Physical Anesthesia Plan  ASA: II  Anesthesia Plan: General   Post-op Pain Management:    Induction: Intravenous  Airway Management Planned: LMA  Additional Equipment:   Intra-op Plan:   Post-operative Plan: Extubation in OR  Informed Consent: I have reviewed the patients History and Physical, chart, labs and discussed the procedure including the risks, benefits and alternatives for the proposed anesthesia with the patient or authorized representative who has indicated his/her understanding and acceptance.   Dental advisory given  Plan Discussed with: CRNA  Anesthesia Plan Comments:         Anesthesia Quick Evaluation

## 2012-05-02 NOTE — Op Note (Signed)
05/02/2012  8:41 AM  PATIENT:  Rachel Sutton  51 y.o. female  PRE-OPERATIVE DIAGNOSIS:  left knee meniscus tear  POST-OPERATIVE DIAGNOSIS:  left knee synovitis and medial synovial plica PROCEDURE:  Procedure(s) (LRB): KNEE ARTHROSCOPY WITH EXCISION PLICA (Left) knee + synovectomy  FINDINGS: Medial joint surfaces mild grade 1 chondral changes. Notch anterior cruciate ligament and PCL were intact with moderate synovitis. Lateral compartment cartilaginous surfaces intact without chondral changes meniscus intact. Moderate synovitis. Patellofemoral joint severe synovitis. Medial synovial plica.  Details of procedure  Surgical site marking was completed in the preop holding area and confirmed by surgeon countersigned the site. The patient was taken to the operating room for general anesthesia with an LMA technique. Anesthesia was smoothly induced. The patient was in the supine position the right leg was padded the left leg was placed in an arthroscopic leg holder. The left lower extremity was prepped and draped with sterile technique  The timeout was executed  Lateral portal was established and the scope was introduced into the patellofemoral joint and a diagnostic arthroscopy was performed by viewing the entire joint. Operative findings are listed above mainly we saw synovitis with no cartilage tears and a significant medial plica. There was significant and severe joint inflammation with hemorrhagic synovium. There was no evidence of ganglion cyst or PVNS  Medial portal was probe was placed in the joint was evaluated with a second our diagnostic scope using the probe to palpate intra-articular structures  There was some mild chondral changes on the medial compartment but they were minimal.  With a combination of an arthroscopic synovial resector and a 90 ArthroCare wand a synovector me was performed and the medial plica was removed. Bleeding was controlled with the ArthroCare device.  The  joint was irrigated and closed with 3-0 nylon sutures and injected with 60 cc of Marcaine with epinephrine  The dressing was applied with an Ace wrap and a Cryo/Cuff was placed and activated  The patient was taken to recovery room in stable condition and should be stable for discharge later today.  SURGEON:  Surgeon(s) and Role:    * Vickki Hearing, MD - Primary  PHYSICIAN ASSISTANT:   ASSISTANTS: none   ANESTHESIA:   general, LMA  EBL:  Total I/O In: 100 [I.V.:100] Out: 0   BLOOD ADMINISTERED:none  DRAINS: none   LOCAL MEDICATIONS USED:  MARCAINE, Amount: 60 ml and OTHER EPI  SPECIMEN:  No Specimen  DISPOSITION OF SPECIMEN:  N/A  COUNTS:  YES  TOURNIQUET:  * Missing tourniquet times found for documented tourniquets in log:  24401 *  DICTATION: .Dragon Dictation  PLAN OF CARE: Home after PACU  PATIENT DISPOSITION:  PACU - hemodynamically stable.   Delay start of Pharmacological VTE agent (>24hrs) due to surgical blood loss or risk of bleeding: not applicable

## 2012-05-02 NOTE — Anesthesia Postprocedure Evaluation (Signed)
  Anesthesia Post-op Note  Patient: Rachel Sutton  Procedure(s) Performed: Procedure(s) (LRB): KNEE ARTHROSCOPY WITH EXCISION PLICA (Left)  Patient Location: PACU  Anesthesia Type: General  Level of Consciousness: awake, alert  and oriented  Airway and Oxygen Therapy: Patient Spontanous Breathing and Patient connected to face mask oxygen  Post-op Pain: mild  Post-op Assessment: Post-op Vital signs reviewed, Patient's Cardiovascular Status Stable, Respiratory Function Stable, Patent Airway and No signs of Nausea or vomiting  Post-op Vital Signs: Reviewed and stable  Complications: No apparent anesthesia complications

## 2012-05-05 ENCOUNTER — Encounter: Payer: Self-pay | Admitting: Orthopedic Surgery

## 2012-05-05 ENCOUNTER — Ambulatory Visit (INDEPENDENT_AMBULATORY_CARE_PROVIDER_SITE_OTHER): Payer: Managed Care, Other (non HMO) | Admitting: Orthopedic Surgery

## 2012-05-05 VITALS — BP 110/60 | Ht 64.0 in | Wt 168.0 lb

## 2012-05-05 DIAGNOSIS — Z9889 Other specified postprocedural states: Secondary | ICD-10-CM

## 2012-05-05 NOTE — Progress Notes (Signed)
Patient ID: Rachel Sutton, female   DOB: [birth year], 51 y.o.   MRN: [id number] BP 110/60  Ht 5\' 4"  (1.626 m)  Wt 168 lb (76.204 kg)  BMI 28.84 kg/m2  Chief Complaint  Patient presents with  . Follow-up    Post op left knee.      05/02/2012  8:41 AM  PATIENT: Rachel Sutton 51 y.o. female  PRE-OPERATIVE DIAGNOSIS: left knee meniscus tear  POST-OPERATIVE DIAGNOSIS: left knee synovitis and medial synovial plica  PROCEDURE: Procedure(s) (LRB):  KNEE ARTHROSCOPY WITH EXCISION PLICA (Left) knee + synovectomy  FINDINGS: Medial joint surfaces mild grade 1 chondral changes. Notch anterior cruciate ligament and PCL were intact with moderate synovitis. Lateral compartment cartilaginous surfaces intact without chondral changes meniscus intact. Moderate synovitis. Patellofemoral joint severe synovitis. Medial synovial plica.   Significant synovitis a medial plica resected. Patient doing well ambulating with a cane  Recommend physical therapy come back in one week to see if she can go back to work

## 2012-05-05 NOTE — Patient Instructions (Signed)
-   Start therapy

## 2012-05-08 ENCOUNTER — Ambulatory Visit (HOSPITAL_COMMUNITY)
Admission: RE | Admit: 2012-05-08 | Discharge: 2012-05-08 | Disposition: A | Discharge status: Home / Self Care | Payer: Managed Care, Other (non HMO) | Source: Ambulatory Visit | Attending: Orthopedic Surgery | Admitting: Orthopedic Surgery

## 2012-05-08 DIAGNOSIS — R262 Difficulty in walking, not elsewhere classified: Secondary | ICD-10-CM | POA: Insufficient documentation

## 2012-05-08 DIAGNOSIS — M25569 Pain in unspecified knee: Secondary | ICD-10-CM | POA: Insufficient documentation

## 2012-05-08 DIAGNOSIS — IMO0001 Reserved for inherently not codable concepts without codable children: Secondary | ICD-10-CM | POA: Insufficient documentation

## 2012-05-08 DIAGNOSIS — M25669 Stiffness of unspecified knee, not elsewhere classified: Secondary | ICD-10-CM | POA: Insufficient documentation

## 2012-05-08 NOTE — Evaluation (Addendum)
Physical Therapy Evaluation  Patient Details  Name: Rachel Sutton MRN: [id number] Date of Birth: 11-15-1961  Today's Date: 05/08/2012 Time: 0800-0840 PT Time Calculation (min): 40 min Charges: 1 eval Visit#: 1  of 8   Re-eval: 06/07/12 Assessment Diagnosis: L knee scope Surgical Date: 05/02/12 Next MD Visit: Dr. Romeo Apple - 05/13/12  Past Medical History:  Past Medical History  Diagnosis Date  . Fuchs' corneal dystrophy   . HTN (hypertension)   . High cholesterol   . Breast cancer 02/22/2012    right T1c, N0  . Hx of mitral valve prolapse 1980  . GERD (gastroesophageal reflux disease)   . Arthritis   . Anxiety    Past Surgical History:  Past Surgical History  Procedure Date  . Breast lumpectomy 10/30/2002    right  . Carpal tunnel r/l 1992  . Ray  cage fusion 1997  . A cervical spine fusion x2 since last visit   . Elbow surgery  tennis elbow release 2012  . Abdominal hysterectomy 1999  . Sentinel lymph node biopsy 10/30/2002    right  . Ovarian cyst removal     prior to hysterectomy, right    Subjective Symptoms/Limitations Symptoms: Pt is referred s/p L knee scope with findings of left knee synovitis and medial synovial plica. She reports insidous onset of injury. Aggrevating factors: Sitting, Standing and walking greater than 15 minutes; Alleviating factors: Ice, Pain medication. She reports complaince w/exercises Dr. Romeo Apple provided How long can you sit comfortably?: 90 minutes How long can you stand comfortably?: 15 minutes How long can you walk comfortably?: 15 minutes w/SPC Pain Assessment Currently in Pain?: Yes Pain Score:   4 (range: 4-8/10) Pain Location: Knee Pain Orientation: Left Pain Type: Acute pain;Surgical pain  Precautions/Restrictions  Precautions Precautions: Other (comment) Precaution Comments: Hx of Breast Cancer   Prior Functioning  Home Living Lives With: Spouse Prior Function Level of Independence: Independent with basic  ADLs Able to Take Stairs?: Reciprically Driving: Yes Vocation: Full time employment 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 Needs to be able to sit and walk for her job.  She would exercise at work and walk up and down stairs for weight loss. Leisure: Hobbies-yes (Comment) Comments: She takes care of her father.  She enjoys bowling, working in her yard.    Cognition/Observation Observation/Other Assessments Observations: Atrophy to L quadricep and L gluteal region  Sensation/Coordination/Flexibility/Functional Tests Functional Tests Functional Tests: 5 STS: 33 sec Functional Tests: LEFS: 24/80  Assessment RLE AROM (degrees) Right Knee Extension 0-130: 0  Right Knee Flexion 0-140: 114  RLE Strength Right Hip Flexion: 3+/5 Right Hip Extension: 3/5 Right Hip ABduction: 3+/5 Right Knee Flexion: 4/5 Right Knee Extension: 3+/5 Palpation Palpation: increased pain and tenderness w/fascial restrictions over L knee medial joint line.  Increased pain with sup/inf patellar mobs  Mobility/Balance  Ambulation/Gait Ambulation/Gait: Yes Assistive device: Straight cane Gait Pattern: Decreased hip/knee flexion - left Static Standing Balance Static Standing - Comment/# of Minutes: Each position held for a max 10 secs Single Leg Stance - Right Leg: 9  Single Leg Stance - Left Leg: 3  Tandem Stance - Right Leg: 5  Tandem Stance - Left Leg: 3  Rhomberg - Eyes Opened: 9  Rhomberg - Eyes Closed: 7    Exercise/Treatments Supine Heel Slides: 15 reps (HEP) Straight Leg Raises: 15 reps Patellar Mobs: Instruction and demonstration for HEP Sidelying Hip ABduction: 15 reps (HEP) Clams: 3x10 sec (HEP) Prone  Hamstring Curl: 15 reps (HEP) Hip Extension:  15 reps (HEP)  Physical Therapy Assessment and Plan PT Assessment and Plan Clinical Impression Statement: Pt is a 51 year old female s/p L knee scope with findings of left knee synovitis and medial synovial plica.  Pt will benefit from  skilled therapeutic intervention in order to improve on the following deficits: Decreased activity tolerance;Pain;Impaired flexibility;Decreased range of motion;Decreased mobility;Difficulty walking;Decreased strength;Impaired perceived functional ability Rehab Potential: Good PT Frequency: Min 2X/week PT Duration: 4 weeks PT Treatment/Interventions: Gait training;Stair training;Functional mobility training;Therapeutic activities;Therapeutic exercise;Balance training;Neuromuscular re-education;Patient/family education;Other (comment) (Manual techniques and Ice only, Hx of Breast Cancer) PT Plan: Add bike, squats, heel and toe raises, stair training, LAQ's, Sit to stand, Tandem stance, SLS (when ready), SAQ's  Review Goals.    Goals Home Exercise Program Pt will Perform Home Exercise Program: Independently PT Goal: Perform Home Exercise Program - Progress: Goal set today PT Short Term Goals Time to Complete Short Term Goals: 2 weeks PT Short Term Goal 1: Pt will ambulate independently in a closed enviroment x5 minutes.  PT Short Term Goal 2: Pt will improve functional strength and demonstrate 5 STS in 25 econds PT Short Term Goal 3: Pt will improve LE strength by 1/2 muscle grade. PT Short Term Goal 4: Pt will improve L knee AROM 0-125 PT Long Term Goals Time to Complete Long Term Goals: 4 weeks PT Long Term Goal 1: Pt will improve her LE strength to [facility] in order to ascend and descend 40 stairs w/1 handrail with reciprocal pattern to return to work activities.  PT Long Term Goal 2: Pt will improve her dymanic single leg balance in order to return to bowling and Wii activities safely. Long Term Goal 3: Pt will improve her LEFS to 35/80 for improved percieved functional ability Long Term Goal 4: Pt will improve her hip strength to WNL in order to ambulate with appropriate gait mechanics independently in an indoor and outdoor environment to decrease risk of secondary injury.   Problem  List Patient Active Problem List  Diagnoses  . KNEE, ARTHRITIS, DEGEN./OSTEO  . DERANGEMENT MENISCUS  . JOINT EFFUSION, LEFT KNEE  . KNEE PAIN  . LATERAL EPICONDYLITIS  . OA (osteoarthritis) of knee  . Medial meniscus, posterior horn derangement  . Breast cancer  . S/P left knee arthroscopy  . Knee pain  . Knee stiffness  . Difficulty in walking    PT - End of Session Activity Tolerance: Patient tolerated treatment well PT Plan of Care PT Home Exercise Plan: See scanned report PT Patient Instructions: Importance of HEP. Consulted and Agree with Plan of Care: Patient  Donnelle Rubey 05/08/2012, 8:51 AM  Physician Documentation Your signature is required to indicate approval of the treatment plan as stated above.  Please sign and either send electronically or make a copy of this report for your files and return this physician signed original.   Please mark one 1.__approve of plan  2. ___approve of plan with the following conditions.   ______________________________                                                          _____________________ Physician Signature  Date  

## 2012-05-13 ENCOUNTER — Ambulatory Visit (INDEPENDENT_AMBULATORY_CARE_PROVIDER_SITE_OTHER): Payer: Managed Care, Other (non HMO) | Admitting: Orthopedic Surgery

## 2012-05-13 ENCOUNTER — Encounter: Payer: Self-pay | Admitting: Orthopedic Surgery

## 2012-05-13 VITALS — BP 100/70 | Ht 64.0 in | Wt 168.0 lb

## 2012-05-13 DIAGNOSIS — M1710 Unilateral primary osteoarthritis, unspecified knee: Secondary | ICD-10-CM

## 2012-05-13 DIAGNOSIS — IMO0002 Reserved for concepts with insufficient information to code with codable children: Secondary | ICD-10-CM

## 2012-05-13 LAB — RHEUMATOID FACTOR: Rhuematoid fact SerPl-aCnc: <10 IU/mL (ref <=14)

## 2012-05-13 NOTE — Progress Notes (Signed)
Patient ID: Rachel Sutton, female   DOB: 15-Mar-1961, 51 y.o.   MRN: [id number] BP 100/70  Ht 5\' 4"  (1.626 m)  Wt 168 lb (76.204 kg)  BMI 28.84 kg/m2  Chief Complaint  Patient presents with  . Procedure    Followup visit status post arthroscopy left knee with synovectomy    12 status post synovectomy left knee unexplained cause  Recommend rheumatoid factor testing be done to rule out rheumatoid arthritis although this would be an unlikely presentation  Her knee looks good her flexion is 125 her extension is good.  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

## 2012-05-13 NOTE — Patient Instructions (Addendum)
Continue work from home   At 2 hour intervals   RTW Monday   Aleve ok

## 2012-05-14 ENCOUNTER — Ambulatory Visit (HOSPITAL_COMMUNITY)
Admission: RE | Admit: 2012-05-14 | Discharge: 2012-05-14 | Disposition: A | Discharge status: Home / Self Care | Payer: Managed Care, Other (non HMO) | Source: Ambulatory Visit | Attending: Orthopedic Surgery | Admitting: Orthopedic Surgery

## 2012-05-14 NOTE — Progress Notes (Signed)
Physical Therapy Treatment Patient Details  Name: Rachel Sutton MRN: [id number] Date of Birth: 23-Sep-1961  Today's Date: 05/14/2012 Time: 4098-1191 PT Time Calculation (min): 60 min  Visit#: 2  of 8   Re-eval: 06/07/12  Charge: therex 44 min Ice 10 min  Subjective: Symptoms/Limitations Symptoms: Pt stated pain scale 4/10 today. Pain Assessment Currently in Pain?: Yes Pain Score:   4 Pain Location: Knee Pain Orientation: Left  Objective:   Exercise/Treatments Stretches Active Hamstring Stretch: 3 reps;30 seconds Aerobic Stationary Bike: 6' @ 1.0 Standing Heel Raises: 15 reps;Limitations Heel Raises Limitations: toe raises  Lateral Step Up: 10 reps;Hand Hold: 1;Step Height: 2" Forward Step Up: Left;10 reps;Hand Hold: 1;Step Height: 2" Functional Squat: 10 reps Rocker Board: 2 minutes;Limitations Rocker Board Limitations: R/L SLS: next session Seated Other Seated Knee Exercises: 5 STS no HHA each foot forward Supine Heel Slides: 15 reps Straight Leg Raises: 15 reps Patellar Mobs: Done/ HEP Sidelying Hip ABduction: 15 reps Prone  Hamstring Curl: 15 reps Hip Extension: 15 reps   Modalities Modalities: Cryotherapy Cryotherapy Number Minutes Cryotherapy: 10 Minutes Cryotherapy Location: Knee Type of Cryotherapy: Ice pack  Physical Therapy Assessment and Plan PT Assessment and Plan Clinical Impression Statement: Session focused on reviewing HEP for proper technique and reviewed goals.  Pt able to complete all HEP exercises with no cueing required.  Began standing therex for strengthening and balance, pt able to complete all exercises correctly following demonstration.  Pt required min assistance with tandem stance, more stable with L LE forward. PT Plan: Mat activities to HEP, next session begin SLS, tandem gait when able to tandem stance independently x 30", increase step height when ready, begin lifting with functional squats.    Goals    Problem  List Patient Active Problem List  Diagnoses  . KNEE, ARTHRITIS, DEGEN./OSTEO  . DERANGEMENT MENISCUS  . JOINT EFFUSION, LEFT KNEE  . KNEE PAIN  . LATERAL EPICONDYLITIS  . OA (osteoarthritis) of knee  . Medial meniscus, posterior horn derangement  . Breast cancer  . S/P left knee arthroscopy  . Knee pain  . Knee stiffness  . Difficulty in walking    PT - End of Session Activity Tolerance: Patient tolerated treatment well General Behavior During Session: [facility] for tasks performed Cognition: [facility] for tasks performed  GP No functional reporting required  Juel Burrow, PTA 05/14/2012, 11:34 AM

## 2012-05-15 ENCOUNTER — Ambulatory Visit (HOSPITAL_COMMUNITY)
Admission: RE | Admit: 2012-05-15 | Discharge: 2012-05-15 | Disposition: A | Discharge status: Home / Self Care | Payer: Managed Care, Other (non HMO) | Source: Ambulatory Visit | Attending: Orthopedic Surgery | Admitting: Orthopedic Surgery

## 2012-05-15 NOTE — Progress Notes (Signed)
Physical Therapy Treatment Patient Details  Name: Rachel Sutton MRN: [id number] Date of Birth: Sep 12, 1961  Today's Date: 05/15/2012 Time: 1478-2956 PT Time Calculation (min): 37 min  Visit#: 3  of 8   Re-eval: 06/06/12 Diagnosis: L knee scope Surgical Date: 05/02/12 Next MD Visit: Dr. Romeo Apple - 05/13/12 Charges:  therex 30'  Subjective: Symptoms/Limitations Symptoms: Pt. states her pain went up to a 6/10 last night and had difficulty getting comfortable.  Currently 4/10. Pain Assessment Currently in Pain?: Yes Pain Score:   4 Pain Location: Knee Pain Orientation: Left   Exercise/Treatments Stretches Active Hamstring Stretch: 3 reps;30 seconds Aerobic Stationary Bike: 6' @ 2.0, seat 6 Standing Heel Raises: 15 reps;Limitations Heel Raises Limitations: toe raises 15 reps Knee Flexion: 10 reps;Limitations Knee Flexion Limitations: 4#weight Lateral Step Up: 10 reps;Step Height: 2";Hand Hold: 1 Forward Step Up: 10 reps;Step Height: 2";Hand Hold: 1 Step Down: 10 reps;Step Height: 2";Hand Hold: 1 Functional Squat: 15 reps Rocker Board: 2 minutes;Limitations Rocker Board Limitations: R/L SLS: 22" max of 3 Other Standing Knee Exercises: tandem stance 2X30" Seated Long Arc Quad: 15 reps;Weights Long Arc Quad Weight: 4 lbs. Other Seated Knee Exercises: 5X each LE STS no HHA each foot forward Supine Heel Slides: Limitations Heel Slides Limitations: HEP Straight Leg Raises: 15 reps;Limitations Straight Leg Raises Limitations: HEP Sidelying Hip ABduction: Limitations Hip ABduction Limitations: HEP Clams: HEP Prone  Hamstring Curl: 15 reps;Limitations Hamstring Curl Limitations: HEP Hip Extension: 15 reps;Limitations Contract/Relax to Increase Flexion: HEP     Physical Therapy Assessment and Plan PT Assessment and Plan Clinical Impression Statement: Began forward step downs and added LAQ with weight and standing hamstring curls with weight.  Noted weakness in end  range with LAQ.  No increased pain noted at end of session; pt. to ice knee at home today. PT Plan: Progress to 4" step, add lunges.  Progress to cybex quad and ham and lifting activities.     Problem List Patient Active Problem List  Diagnoses  . KNEE, ARTHRITIS, DEGEN./OSTEO  . DERANGEMENT MENISCUS  . JOINT EFFUSION, LEFT KNEE  . KNEE PAIN  . LATERAL EPICONDYLITIS  . OA (osteoarthritis) of knee  . Medial meniscus, posterior horn derangement  . Breast cancer  . S/P left knee arthroscopy  . Knee pain  . Knee stiffness  . Difficulty in walking    PT - End of Session Activity Tolerance: Patient tolerated treatment well General Behavior During Session: [facility] for tasks performed Cognition: [facility] for tasks performed  GP No functional reporting required  Lurena Nida, PTA/CLT 05/15/2012, 10:22 AM

## 2012-05-20 ENCOUNTER — Ambulatory Visit (HOSPITAL_COMMUNITY)
Admission: RE | Admit: 2012-05-20 | Discharge: 2012-05-20 | Disposition: A | Discharge status: Home / Self Care | Payer: Managed Care, Other (non HMO) | Source: Ambulatory Visit | Attending: Pulmonary Disease | Admitting: Pulmonary Disease

## 2012-05-20 DIAGNOSIS — IMO0001 Reserved for inherently not codable concepts without codable children: Secondary | ICD-10-CM | POA: Insufficient documentation

## 2012-05-20 DIAGNOSIS — R262 Difficulty in walking, not elsewhere classified: Secondary | ICD-10-CM | POA: Insufficient documentation

## 2012-05-20 DIAGNOSIS — M25569 Pain in unspecified knee: Secondary | ICD-10-CM | POA: Insufficient documentation

## 2012-05-20 DIAGNOSIS — M25669 Stiffness of unspecified knee, not elsewhere classified: Secondary | ICD-10-CM | POA: Insufficient documentation

## 2012-05-20 NOTE — Progress Notes (Signed)
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  Pain scale 2-3/10 L knee today. Pain Assessment Currently in Pain?: Yes Pain Score:   3 Pain Location: Knee Pain Orientation: Left  Objective:   Exercise/Treatments Aerobic Stationary Bike: 6' @ 2.5, seat 6 Machines for Strengthening Cybex Knee Extension: 1.5 Pl 15 reps Cybex Knee Flexion: 3 Pl 15 reps Standing Heel Raises: 15 reps;Limitations Heel Raises Limitations: toe raises 15 reps Lateral Step Up: Left;15 reps;Hand Hold: 0;Step Height: 4" Forward Step Up: Left;15 reps;Hand Hold: 0;Step Height: 4" Step Down: Left;15 reps;Hand Hold: 1;Step Height: 4" Functional Squat: 15 reps;Limitations Functional Squat Limitations: lifting soccer ball from 8 in step Rocker Board: 2 minutes;Limitations Rocker Board Limitations: R/L SLS: 23" max of 4 SLS with Vectors: 5x 5" with 1 finger HHA Other Standing Knee Exercises: tandem stance 2X30", Seated Other Seated Knee Exercises: 5X each LE STS no HHA R LE forward 24 sec   Modalities Modalities: Cryotherapy Cryotherapy Number Minutes Cryotherapy: 10 Minutes Cryotherapy Location: Knee Type of Cryotherapy: Ice pack  Physical Therapy Assessment and Plan PT Assessment and Plan Clinical Impression Statement: Progressed therex this session, pt able to advance with min difficlty.  Able to increase step height with all stair training wtih good mechanics, noted visitible quad fatigue with step down and new cybex quad machine. Good mechanics noted with functional squats lifting following demonstration.  Able to  advance tandem stance to tandem gait and retro tandem with no assistance required just min vc-ing to secure balance before advancing next LE. PT Plan: Advance to dynamic surface with balance activities next session, continue strengthening therex, add chair stretch to increase knee flexion.    Goals    Problem List Patient Active Problem List  Diagnoses  . KNEE, ARTHRITIS, DEGEN./OSTEO  . DERANGEMENT MENISCUS  . JOINT EFFUSION, LEFT KNEE  . KNEE PAIN  . LATERAL EPICONDYLITIS  . OA (osteoarthritis) of knee  . Medial meniscus, posterior horn derangement  . Breast cancer  . S/P left knee arthroscopy  . Knee pain  . Knee stiffness  . Difficulty in walking    PT - End of Session Activity Tolerance: Patient tolerated treatment well General Behavior During Session: [facility] for tasks performed Cognition: [facility] for tasks performed  Juel Burrow, PTA 05/20/2012, 10:28 AM

## 2012-05-22 ENCOUNTER — Ambulatory Visit (HOSPITAL_COMMUNITY)
Admission: RE | Admit: 2012-05-22 | Discharge: 2012-05-22 | Disposition: A | Discharge status: Home / Self Care | Payer: Managed Care, Other (non HMO) | Source: Ambulatory Visit | Attending: Pulmonary Disease | Admitting: Pulmonary Disease

## 2012-05-22 ENCOUNTER — Telehealth: Payer: Self-pay | Admitting: *Deleted

## 2012-05-22 NOTE — Progress Notes (Signed)
Physical Therapy Treatment Patient Details  Name: SARGUN RUMMELL MRN: [id number] Date of Birth: Oct 13, 1961  Today's Date: 05/22/2012 Time:  4098-1191  Visit#: 5  of 8   Re-eval: 06/06/12  Charge: therex 35 min Manual 8 Ice 10 min  Subjective: Symptoms/Limitations Symptoms: Had good workout last session.  Pt c/o knee cap popping, no pain. Pain Assessment Currently in Pain?: No/denies  Objective:   Exercise/Treatments Stretches Active Hamstring Stretch: 3 reps;30 seconds Quad Stretch: 2 reps;30 seconds Knee: Self-Stretch to increase Flexion: 2 reps;30 seconds Aerobic Stationary Bike: 6' @ 2.5, seat 6 Machines for Strengthening Cybex Knee Extension: 1.5 Pl 15 reps Cybex Knee Flexion: 3 Pl 15 reps Standing Heel Raises: 15 reps;Limitations Heel Raises Limitations: toe raises 15 reps Lateral Step Up: Left;15 reps;Step Height: 4";Hand Hold: 1 Forward Step Up: Left;15 reps;Hand Hold: 0;Step Height: 4" Step Down: Left;15 reps;Hand Hold: 1;Step Height: 4" Functional Squat: 15 reps;Limitations Functional Squat Limitations: lifting soccer ball from 8 in step Stairs: 1 FL reciprocal pattern 1 HR Rocker Board: 2 minutes;Limitations Rocker Board Limitations: R/L SLS: 36" max of 3 SLS with Vectors: 5x 5" with 1 finger HHA Other Standing Knee Exercises: tandem gait 1 RT Supine Patellar Mobs: Done   Manual Therapy Manual Therapy: Joint mobilization Joint Mobilization: Grade II-III tib/fib A/P, patella R/L, S/I and tibial joint mobs Cryotherapy Number Minutes Cryotherapy: 10 Minutes Cryotherapy Location: Knee Type of Cryotherapy: Ice pack  Physical Therapy Assessment and Plan PT Assessment and Plan PT Plan: Continue with current POC, begin static retro tandem gait and advance to dynamic surface with balance activities when ready, continue progressing overall strength.    Goals    Problem List Patient Active Problem List  Diagnoses  . KNEE, ARTHRITIS, DEGEN./OSTEO  .  DERANGEMENT MENISCUS  . JOINT EFFUSION, LEFT KNEE  . KNEE PAIN  . LATERAL EPICONDYLITIS  . OA (osteoarthritis) of knee  . Medial meniscus, posterior horn derangement  . Breast cancer  . S/P left knee arthroscopy  . Knee pain  . Knee stiffness  . Difficulty in walking    PT - End of Session Activity Tolerance: Patient tolerated treatment well General Behavior During Session: [facility] for tasks performed Cognition: [facility] for tasks performed  GP No functional reporting required  Juel Burrow, PTA 05/22/2012, 12:53 PM

## 2012-05-22 NOTE — Telephone Encounter (Signed)
Spoke with patient by phone today regarding next week's f/u visit. Explained to patient that she would be seen by Dr. Donnie Coffin instead of Amy Allyson Sabal.  Patient denies any cardiac problems in the past year, although she reports having had knee surgery recently due to arthritis. She is continuing treatments for Fuch's corneal dsytrophy.   Patient recalled that last year her parents were moving in with her due to declining health. Since that time, her mother developed back pain in late February and was later diagnosed with stage IV pancreatic cancer, and died on 03-20-2023. Her father, who has dementia, continues to live with her.  Patient is aware that annual follow-up visits will continue through June 2014, 10 years from the end of chemotherapy visit.

## 2012-05-27 ENCOUNTER — Encounter: Payer: Self-pay | Admitting: *Deleted

## 2012-05-27 ENCOUNTER — Ambulatory Visit: Payer: Managed Care, Other (non HMO) | Admitting: Physician Assistant

## 2012-05-27 ENCOUNTER — Ambulatory Visit (HOSPITAL_COMMUNITY)
Admission: RE | Admit: 2012-05-27 | Discharge: 2012-05-27 | Disposition: A | Discharge status: Home / Self Care | Payer: Managed Care, Other (non HMO) | Source: Ambulatory Visit | Attending: Pulmonary Disease | Admitting: Pulmonary Disease

## 2012-05-27 ENCOUNTER — Ambulatory Visit (HOSPITAL_BASED_OUTPATIENT_CLINIC_OR_DEPARTMENT_OTHER): Payer: Managed Care, Other (non HMO) | Admitting: Oncology

## 2012-05-27 ENCOUNTER — Other Ambulatory Visit (HOSPITAL_BASED_OUTPATIENT_CLINIC_OR_DEPARTMENT_OTHER): Payer: Managed Care, Other (non HMO) | Admitting: Lab

## 2012-05-27 VITALS — BP 111/67 | HR 86 | Temp 98.3°F | Ht 64.0 in | Wt 171.1 lb

## 2012-05-27 DIAGNOSIS — C50919 Malignant neoplasm of unspecified site of unspecified female breast: Secondary | ICD-10-CM

## 2012-05-27 DIAGNOSIS — C50219 Malignant neoplasm of upper-inner quadrant of unspecified female breast: Secondary | ICD-10-CM

## 2012-05-27 DIAGNOSIS — Z171 Estrogen receptor negative status [ER-]: Secondary | ICD-10-CM

## 2012-05-27 DIAGNOSIS — E559 Vitamin D deficiency, unspecified: Secondary | ICD-10-CM

## 2012-05-27 LAB — CBC WITH DIFFERENTIAL/PLATELET
BASO%: 0.9 % (ref 0.0–2.0)
Basophils Absolute: 0.1 10*3/uL (ref 0.0–0.1)
EOS%: 3.2 % (ref 0.0–7.0)
Eosinophils Absolute: 0.3 10*3/uL (ref 0.0–0.5)
HCT: 40.1 % (ref 34.8–46.6)
HGB: 13.4 g/dL (ref 11.6–15.9)
LYMPH%: 35.6 % (ref 14.0–49.7)
MCH: 30.5 pg (ref 25.1–34.0)
MCHC: 33.5 g/dL (ref 31.5–36.0)
MCV: 91.1 fL (ref 79.5–101.0)
MONO#: 0.5 10*3/uL (ref 0.1–0.9)
MONO%: 4.8 % (ref 0.0–14.0)
NEUT#: 5.8 10*3/uL (ref 1.5–6.5)
NEUT%: 55.5 % (ref 38.4–76.8)
Platelets: 292 10*3/uL (ref 145–400)
RBC: 4.4 10*6/uL (ref 3.70–5.45)
RDW: 12.9 % (ref 11.2–14.5)
WBC: 10.5 10*3/uL — ABNORMAL HIGH (ref 3.9–10.3)
lymph#: 3.7 10*3/uL — ABNORMAL HIGH (ref 0.9–3.3)

## 2012-05-27 LAB — COMPREHENSIVE METABOLIC PANEL
ALT: 14 U/L (ref 0–35)
AST: 21 U/L (ref 0–37)
Albumin: 4.1 g/dL (ref 3.5–5.2)
Alkaline Phosphatase: 105 U/L (ref 39–117)
BUN: 18 mg/dL (ref 6–23)
CO2: 25 mEq/L (ref 19–32)
Calcium: 9.6 mg/dL (ref 8.4–10.5)
Chloride: 105 mEq/L (ref 96–112)
Creatinine, Ser: 0.83 mg/dL (ref 0.50–1.10)
Glucose, Bld: 115 mg/dL — ABNORMAL HIGH (ref 70–99)
Potassium: 3.7 mEq/L (ref 3.5–5.3)
Sodium: 141 mEq/L (ref 135–145)
Total Bilirubin: 0.4 mg/dL (ref 0.3–1.2)
Total Protein: 7.3 g/dL (ref 6.0–8.3)

## 2012-05-27 LAB — CANCER ANTIGEN 27.29: CA 27.29: 31 U/mL (ref 0–39)

## 2012-05-27 LAB — LACTATE DEHYDROGENASE: LDH: 188 U/L (ref 94–250)

## 2012-05-27 NOTE — Progress Notes (Signed)
Physical Therapy Treatment Patient Details  Name: Rachel Sutton MRN: [id number] Date of Birth: 11/02/1961  Today's Date: 05/27/2012 Time: 4098-1191 PT Time Calculation (min): 45 min Charges: 58' TE, 5' Manual Visit#: 6  of 8   Re-eval: 06/06/12 Assessment Diagnosis: L knee scope Surgical Date: 05/02/12 Next MD Visit: Dr. Romeo Apple - 06/04/12  Subjective: Symptoms/Limitations Symptoms: I am still having some difficulty going down the stairs and stiffness.  Continues to have grinding under her knee cap Pain Assessment Currently in Pain?: Yes Pain Score:   2 Pain Location: Knee Pain Orientation: Left  Exercise/Treatments Aerobic Stationary Bike: 8' 4.0 for strength Machines for Strengthening Cybex Knee Extension: 1.5 Pl 15 reps w/L eccentric lowering Cybex Knee Flexion: 3 PL 2x10 LLE only Standing Heel Raises: Other (comment) (HEP) Heel Raises Limitations:  (HEP) Lateral Step Up: Left;15 reps;Hand Hold: 1;Step Height: 6" Forward Step Up: Left;20 reps;Hand Hold: 1;Step Height: 6" Step Down: Left;20 reps;Hand Hold: 1;Step Height: 4" Supine Heel Slides: AROM;10 reps (after joint mobs) Bridges: 10 reps;Other (comment) (5 sec hold, cueing for L hip lift) Other Supine Knee Exercises: Hamstring Circuit w/green ball: 20 bridges, 5 roll ups  Manual Therapy Joint Mobilization: Grade II-IV tib/fib A/P, patella R/L, S/I and tibial joint mobs after lateral step ups to decrease popping.   Physical Therapy Assessment and Plan PT Assessment and Plan Clinical Impression Statement: Pt able to increase weights and reps without difficulty. After joint mobilization, pt able to complete rest of therapy without popping.  Has appropriate knee tracking, however increased crepitius w/heel slides.  PT Plan: Re-evaluate next visit.     Goals Home Exercise Program Pt will Perform Home Exercise Program: Independently PT Short Term Goals Time to Complete Short Term Goals: 2 weeks PT Short Term Goal  1: Pt will ambulate independently in a closed enviroment x5 minutes.  PT Short Term Goal 1 - Progress: Met PT Short Term Goal 2: Pt will improve functional strength and demonstrate 5 STS in 25 econds PT Short Term Goal 2 - Progress: Progressing toward goal PT Short Term Goal 3: Pt will improve LE strength by 1/2 muscle grade. PT Short Term Goal 3 - Progress: Progressing toward goal PT Short Term Goal 4: Pt will improve L knee AROM 0-125 PT Short Term Goal 4 - Progress: Progressing toward goal PT Long Term Goals Time to Complete Long Term Goals: 4 weeks PT Long Term Goal 1: Pt will improve her LE strength to [facility] in order to ascend and descend 40 stairs w/1 handrail with reciprocal pattern to return to work activities.  PT Long Term Goal 1 - Progress: Partly met (ascending only) PT Long Term Goal 2: Pt will improve her dymanic single leg balance in order to return to bowling and Wii activities safely. PT Long Term Goal 2 - Progress: Progressing toward goal Long Term Goal 3: Pt will improve her LEFS to 35/80 for improved percieved functional ability Long Term Goal 4: Pt will improve her hip strength to WNL in order to ambulate with appropriate gait mechanics independently in an indoor and outdoor environment to decrease risk of secondary injury.  Long Term Goal 4 Progress: Progressing toward goal  Problem List Patient Active Problem List  Diagnoses  . KNEE, ARTHRITIS, DEGEN./OSTEO  . DERANGEMENT MENISCUS  . JOINT EFFUSION, LEFT KNEE  . KNEE PAIN  . LATERAL EPICONDYLITIS  . OA (osteoarthritis) of knee  . Medial meniscus, posterior horn derangement  . Breast cancer  . S/P left knee arthroscopy  .  Knee pain  . Knee stiffness  . Difficulty in walking    PT Plan of Care PT Home Exercise Plan: Bridges, Heel walking, Wii actitivies and step downs on small stairs.  PT Patient Instructions: encouraged pt to continue with her Wii activities.   Consulted and Agree with Plan of Care:  Patient  GP No functional reporting required  Rachel Sutton 05/27/2012, 9:38 AM

## 2012-05-27 NOTE — Progress Notes (Signed)
05/27/12 at 3:35pm - The pt is in the clinic today for her annual follow up with Dr. Donnie Coffin.  She reports that she is doing well.  She reports a recent knee surgery, and she states she is still in physical therapy for her rehabilitation.  The pt states that she is performing all of her usual activities, but is slower due to her surgery.  She states she has no sign/symptom of her disease that affects her performance status.  Karnofsky index for performance status is 100%.  The pt is aware that her next appointment is due in June 2014.  The pt was seen and examined today by Dr. Donnie Coffin.

## 2012-05-29 ENCOUNTER — Ambulatory Visit (HOSPITAL_COMMUNITY): Payer: Managed Care, Other (non HMO) | Admitting: Physical Therapy

## 2012-05-29 ENCOUNTER — Telehealth (HOSPITAL_COMMUNITY): Payer: Self-pay

## 2012-05-29 NOTE — Progress Notes (Signed)
Hematology and Oncology Follow Up Visit  ZILAH VILLAFLOR [id number] Apr 18, 1961 51 y.o. 05/29/2012 12:36 AM   1. DIAGNOSIS: A 51 year old [place], [place], woman with a history of an ER-PR negative HER2 positive, T1C, N0 right breast carcinoma S/P right lumpectomy with sentinel node dissection on the [facility] protocol 40981.  She received Taxotere and Herceptin with Herceptin completed in March 2005 and radiation to the right breast. 2. History of C4-5 fusion in 2008 with repeat surgery September 2009.   Encounter Diagnoses  Name Primary?  . Breast cancer Yes  . Unspecified vitamin D deficiency      PAST THERAPY: as above   Interim History:  She has been doing well, no new complaints. She has some difficulty sleeping. She is working at [employer] and enjoys it.   Medications: I have reviewed the patient's current medications.  Allergies:  Allergies  Allergen Reactions  . Bee Venom Anaphylaxis  . Haloperidol Lactate Other (See Comments)    Convulsions   . Meperidine Hcl Hives    Past Medical History, Surgical history, Social history, and Family History were reviewed and updated.  Review of Systems: Constitutional:  Negative for fever, chills, night sweats, anorexia, weight loss, pain. Cardiovascular: no chest pain or dyspnea on exertion Respiratory: negative Neurological: negative Dermatological: negative ENT: negative Skin Gastrointestinal: negative Genito-Urinary: negative Hematological and Lymphatic: negative Breast: negative Musculoskeletal: negative Remaining ROS negative.  Physical Exam:  Blood pressure 111/67, pulse 86, temperature 98.3 F (36.8 C), height 5\' 4"  (1.626 m), weight 171 lb 1.6 oz (77.61 kg).  ECOG: 0   General appearance: alert, cooperative and appears stated age Eyes: conjunctivae/corneas clear. PERRL, EOM's intact. Fundi benign. Throat: normal findings: lips normal without lesions Resp: clear to auscultation bilaterally and normal  percussion bilaterally Breasts: normal appearance, no masses or tenderness Cardio: regular rate and rhythm, S1, S2 normal, no murmur, click, rub or gallop and normal apical impulse GI: soft, non-tender; bowel sounds normal; no masses,  no organomegaly Extremities: extremities normal, atraumatic, no cyanosis or edema Pulses: 2+ and symmetric Lymph nodes: Cervical, supraclavicular, and axillary nodes normal. Neurologic: Grossly normal   Lab Results: Lab Results  Component Value Date   WBC 10.5* 05/27/2012   HGB 13.4 05/27/2012   HCT 40.1 05/27/2012   MCV 91.1 05/27/2012   PLT 292 05/27/2012     Chemistry      Component Value Date/Time   NA 141 05/27/2012 1503   K 3.7 05/27/2012 1503   CL 105 05/27/2012 1503   CO2 25 05/27/2012 1503   BUN 18 05/27/2012 1503   CREATININE 0.83 05/27/2012 1503      Component Value Date/Time   CALCIUM 9.6 05/27/2012 1503   ALKPHOS 105 05/27/2012 1503   AST 21 05/27/2012 1503   ALT 14 05/27/2012 1503   BILITOT 0.4 05/27/2012 1503       Radiological Studies:  Mammogram 3/13- wnl.   IMPRESSIONS AND PLAN: A 51 y.o. female with history of her2+ breast cancer s/p lumpectomy with treatment on 006 protocol, currently doing well, with no complaints. F/u planned in 1 year.    Spent more than half the time coordinating care.    Saahil Herbster 6/13/201312:36 AM

## 2012-05-30 ENCOUNTER — Ambulatory Visit (HOSPITAL_COMMUNITY)
Admission: RE | Admit: 2012-05-30 | Discharge: 2012-05-30 | Disposition: A | Discharge status: Home / Self Care | Payer: Managed Care, Other (non HMO) | Source: Ambulatory Visit | Attending: Pulmonary Disease | Admitting: Pulmonary Disease

## 2012-05-30 NOTE — Progress Notes (Signed)
Physical Therapy Treatment Patient Details  Name: Rachel Sutton MRN: [id number] Date of Birth: 1961/12/15  Today's Date: 05/30/2012 Time: 1425-1525 PT Time Calculation (min): 60 min  Visit#: 7  of 8   Re-eval: 06/06/12 Charges: Therex x 34' Manual x 5' Ice x 10'   Subjective: Symptoms/Limitations Symptoms: Pt states she is sore today from having to use the stairs at work yesterday because the power went out. Pain Assessment Currently in Pain?: Yes Pain Score:   4 Pain Location: Knee Pain Orientation: Left   Exercise/Treatments Aerobic Stationary Bike: 8' 4.0 for strength Machines for Strengthening Cybex Knee Extension: 1.5 Pl 20 reps w/L eccentric lowering Cybex Knee Flexion: 3 PL 20 reps LLE only Standing Lateral Step Up: Left;15 reps;Hand Hold: 1;Step Height: 6" Forward Step Up: Left;20 reps;Hand Hold: 1;Step Height: 6" Step Down: Left;20 reps;Hand Hold: 1;Step Height: 4" Rocker Board: 2 minutes;Limitations Rocker Board Limitations: R/L SLS with Vectors: 5x 5" with 1 finger HHA Other Standing Knee Exercises: tandem gait 2 RT Supine Bridges: 15 reps Other Supine Knee Exercises: Hamstring Circuit w/green ball: 20 bridges, 5 roll ups   Manual Therapy Joint Mobilization: Grade II-IV tib/fib A/P, patella R/L to L knee Cryotherapy Number Minutes Cryotherapy: 10 Minutes Cryotherapy Location: Knee Type of Cryotherapy: Ice pack  Physical Therapy Assessment and Plan PT Assessment and Plan Clinical Impression Statement: Pt completes therex well with minimal difficulty. Pt presents with decreased crepitus with knee movement. Ice applied at end of session to limit pain and swelling. Pt reports no change in pain at end of session. PT Plan: Re-eval next session.     Problem List Patient Active Problem List  Diagnosis  . KNEE, ARTHRITIS, DEGEN./OSTEO  . DERANGEMENT MENISCUS  . JOINT EFFUSION, LEFT KNEE  . KNEE PAIN  . LATERAL EPICONDYLITIS  . OA (osteoarthritis) of  knee  . Medial meniscus, posterior horn derangement  . Breast cancer  . S/P left knee arthroscopy  . Knee pain  . Knee stiffness  . Difficulty in walking    PT - End of Session Activity Tolerance: Patient tolerated treatment well General Behavior During Session: [facility] for tasks performed Cognition: [facility] for tasks performed    Seth Bake, PTA 05/30/2012, 4:44 PM

## 2012-06-03 ENCOUNTER — Ambulatory Visit (HOSPITAL_COMMUNITY)
Admission: RE | Admit: 2012-06-03 | Discharge: 2012-06-03 | Disposition: A | Discharge status: Home / Self Care | Payer: Managed Care, Other (non HMO) | Source: Ambulatory Visit | Attending: Pulmonary Disease | Admitting: Pulmonary Disease

## 2012-06-03 ENCOUNTER — Ambulatory Visit: Payer: Managed Care, Other (non HMO) | Admitting: Orthopedic Surgery

## 2012-06-03 NOTE — Progress Notes (Signed)
Physical Therapy Re-evaluation/treatment  Patient Details  Name: Rachel Sutton MRN: [id number] Date of Birth: February 24, 1961  Today's Date: 06/03/2012 Time: 7829-5621 PT Time Calculation (min): 44 min  Visit#: 8  of 8   Re-eval:   Assessment Diagnosis: L knee scope Surgical Date: 05/02/12 Next MD Visit: Dr. Romeo Apple - 06/04/12 Charge: therex 12 min Gait training 10 min Self care 10 min MMT 1 unit  ROM Measurement 1 unit  Subjective Symptoms/Limitations Symptoms: Pt stated super patella L knee pain today, 2/10. Pain Assessment Currently in Pain?: Yes Pain Score:   2 Pain Location: Knee Pain Orientation: Left;Anterior  Objective:   Sensation/Coordination/Flexibility/Functional Tests Functional Tests Functional Tests: LEFS 61/80 Functional Tests: 5 STS no HHA 18 sec  Assessment RLE AROM (degrees) Right Knee Extension: 0  Right Knee Flexion: 135  (was 114) RLE Strength Right Hip Flexion:  (4+/5 was 3+/5) Right Hip Extension:  (4+/5 was 3/5) Right Hip ABduction:  (4+/5 was 3+/5) Right Knee Flexion:  (4+/5 was 4/5) Right Knee Extension:  (was 3+/5)  Exercise/Treatments Aerobic Stationary Bike: 8' 4.0 for strength Standing Stairs: 2 FL reciprocal pattern 1 HR Other Standing Knee Exercises: bowling mechanics 5 reps   Physical Therapy Assessment and Plan PT Assessment and Plan Clinical Impression Statement: Re-eval complete.  Mrs Rachel Sutton has had 8/8 OPPT sessions over 4 weeks with the following findings:  pt has met 4/4 STGs and 4/4 LTGs with ability to ambulate independently indoor and outdoor environments safely, strength improved with ability to ascend and descend stairs reciprocally with 1 handrail, able to demonstrate 5 STS in 18 seconds without HHA, increased AROM to 0-135 equal to opposite LE, dynamic balance improving, able to SLS 30 sec and demonstrate safe mechanics to return to bowling and W[employer] activities, pt with improved perceived lower extremity functional scale  with score 61/80. PT Plan: D/C to HEP per all goals met.    Goals Home Exercise Program Pt will Perform Home Exercise Program: Independently PT Goal: Perform Home Exercise Program - Progress: Met PT Short Term Goals Time to Complete Short Term Goals: 2 weeks PT Short Term Goal 1: Pt will ambulate independently in a closed enviroment x5 minutes.  PT Short Term Goal 1 - Progress: Met PT Short Term Goal 2: Pt will improve functional strength and demonstrate 5 STS in 25 econds PT Short Term Goal 2 - Progress: Met (18 seconds) PT Short Term Goal 3: Pt will improve LE strength by 1/2 muscle grade. PT Short Term Goal 3 - Progress: Met PT Short Term Goal 4: Pt will improve L knee AROM 0-125 PT Short Term Goal 4 - Progress: Met (0-134) PT Long Term Goals Time to Complete Long Term Goals: 4 weeks PT Long Term Goal 1: Pt will improve her LE strength to [facility] in order to ascend and descend 40 stairs w/1 handrail with reciprocal pattern to return to work activities.  PT Long Term Goal 1 - Progress: Met (ascend w/o diff., able to descend correctly but not confiden) PT Long Term Goal 2: Pt will improve her dymanic single leg balance in order to return to bowling and Wii activities safely. PT Long Term Goal 2 - Progress: Met Long Term Goal 3: Pt will improve her LEFS to 35/80 for improved percieved functional ability Long Term Goal 3 Progress: Met (61/80) Long Term Goal 4: Pt will improve her hip strength to WNL in order to ambulate with appropriate gait mechanics independently in an indoor and outdoor environment to decrease risk of  secondary injury.  Long Term Goal 4 Progress: Met  Problem List Patient Active Problem List  Diagnosis  . KNEE, ARTHRITIS, DEGEN./OSTEO  . DERANGEMENT MENISCUS  . JOINT EFFUSION, LEFT KNEE  . KNEE PAIN  . LATERAL EPICONDYLITIS  . OA (osteoarthritis) of knee  . Medial meniscus, posterior horn derangement  . Breast cancer  . S/P left knee arthroscopy  . Knee pain  .  Knee stiffness  . Difficulty in walking    PT - End of Session Activity Tolerance: Patient tolerated treatment well General Behavior During Session: [facility] for tasks performed Cognition: [facility] for tasks performed  Juel Burrow 06/03/2012, 10:30 AM

## 2012-06-04 ENCOUNTER — Encounter: Payer: Self-pay | Admitting: Orthopedic Surgery

## 2012-06-04 ENCOUNTER — Ambulatory Visit (INDEPENDENT_AMBULATORY_CARE_PROVIDER_SITE_OTHER): Payer: Managed Care, Other (non HMO) | Admitting: Orthopedic Surgery

## 2012-06-04 VITALS — BP 120/80 | Ht 64.0 in | Wt 171.0 lb

## 2012-06-04 DIAGNOSIS — Z9889 Other specified postprocedural states: Secondary | ICD-10-CM

## 2012-06-04 NOTE — Progress Notes (Signed)
Patient ID: Rachel Sutton, female   DOB: December 13, 1961, 51 y.o.   MRN: [id number] Chief Complaint  Patient presents with  . Follow-up    3 week recheck Left knee, DOS 05/02/12    BP 120/80  Ht 5\' 4"  (1.626 m)  Wt 77.565 kg (171 lb)  BMI 29.35 kg/m2  Status post tylectomy, LEFT knee, doing well.  She was concerned about morning stiffness in her hands when she gets up in the morning, which is resolved at about 45 minutes and by taking some Aleve. Her rheumatoid factor was negative.  She has a family history of arthritis not sure, if it's rheumatoid  Her LEFT knee is doing well. She would like to RIGHT a motorcycle, which has been approved.  She should continue to do the exercises secondary to some residual weakness.  She will follow up as needed

## 2012-06-04 NOTE — Patient Instructions (Signed)
activities as tolerated 

## 2012-06-05 ENCOUNTER — Ambulatory Visit (HOSPITAL_COMMUNITY): Payer: Managed Care, Other (non HMO) | Admitting: Physical Therapy

## 2012-10-22 ENCOUNTER — Encounter (HOSPITAL_COMMUNITY): Payer: Self-pay | Admitting: *Deleted

## 2012-10-22 ENCOUNTER — Observation Stay (HOSPITAL_COMMUNITY)
Admission: EM | Admit: 2012-10-22 | Discharge: 2012-10-26 | Disposition: A | Discharge status: Home / Self Care | Payer: Managed Care, Other (non HMO) | Attending: Cardiology | Admitting: Cardiology

## 2012-10-22 ENCOUNTER — Emergency Department (HOSPITAL_COMMUNITY): Payer: Managed Care, Other (non HMO)

## 2012-10-22 DIAGNOSIS — R0789 Other chest pain: Principal | ICD-10-CM

## 2012-10-22 DIAGNOSIS — I059 Rheumatic mitral valve disease, unspecified: Secondary | ICD-10-CM | POA: Insufficient documentation

## 2012-10-22 DIAGNOSIS — I10 Essential (primary) hypertension: Secondary | ICD-10-CM

## 2012-10-22 DIAGNOSIS — E785 Hyperlipidemia, unspecified: Secondary | ICD-10-CM

## 2012-10-22 DIAGNOSIS — Z23 Encounter for immunization: Secondary | ICD-10-CM | POA: Insufficient documentation

## 2012-10-22 DIAGNOSIS — Z8249 Family history of ischemic heart disease and other diseases of the circulatory system: Secondary | ICD-10-CM | POA: Insufficient documentation

## 2012-10-22 DIAGNOSIS — I2510 Atherosclerotic heart disease of native coronary artery without angina pectoris: Secondary | ICD-10-CM | POA: Insufficient documentation

## 2012-10-22 DIAGNOSIS — R079 Chest pain, unspecified: Secondary | ICD-10-CM

## 2012-10-22 DIAGNOSIS — F17200 Nicotine dependence, unspecified, uncomplicated: Secondary | ICD-10-CM | POA: Insufficient documentation

## 2012-10-22 DIAGNOSIS — Z720 Tobacco use: Secondary | ICD-10-CM

## 2012-10-22 DIAGNOSIS — I341 Nonrheumatic mitral (valve) prolapse: Secondary | ICD-10-CM

## 2012-10-22 LAB — POCT I-STAT TROPONIN I
Troponin i, poc: 0.01 ng/mL (ref 0.00–0.08)
Troponin i, poc: 0.01 ng/mL (ref 0.00–0.08)

## 2012-10-22 LAB — CBC WITH DIFFERENTIAL/PLATELET
Basophils Absolute: 0.1 10*3/uL (ref 0.0–0.1)
Basophils Relative: 1 % (ref 0–1)
Eosinophils Absolute: 0.4 10*3/uL (ref 0.0–0.7)
Eosinophils Relative: 4 % (ref 0–5)
HCT: 40.7 % (ref 36.0–46.0)
Hemoglobin: 14.5 g/dL (ref 12.0–15.0)
Lymphocytes Relative: 49 % — ABNORMAL HIGH (ref 12–46)
Lymphs Abs: 4.9 10*3/uL — ABNORMAL HIGH (ref 0.7–4.0)
MCH: 30.7 pg (ref 26.0–34.0)
MCHC: 35.6 g/dL (ref 30.0–36.0)
MCV: 86 fL (ref 78.0–100.0)
Monocytes Absolute: 0.7 10*3/uL (ref 0.1–1.0)
Monocytes Relative: 7 % (ref 3–12)
Neutro Abs: 4 10*3/uL (ref 1.7–7.7)
Neutrophils Relative %: 39 % — ABNORMAL LOW (ref 43–77)
Platelets: 344 10*3/uL (ref 150–400)
RBC: 4.73 MIL/uL (ref 3.87–5.11)
RDW: 12.9 % (ref 11.5–15.5)
WBC: 10.1 10*3/uL (ref 4.0–10.5)

## 2012-10-22 LAB — URINALYSIS, ROUTINE W REFLEX MICROSCOPIC
Bilirubin Urine: NEGATIVE
Glucose, UA: NEGATIVE mg/dL
Hgb urine dipstick: NEGATIVE
Ketones, ur: NEGATIVE mg/dL
Leukocytes, UA: NEGATIVE
Nitrite: NEGATIVE
Protein, ur: NEGATIVE mg/dL
Specific Gravity, Urine: 1.025 (ref 1.005–1.030)
Urobilinogen, UA: 0.2 mg/dL (ref 0.0–1.0)
pH: 6 (ref 5.0–8.0)

## 2012-10-22 LAB — COMPREHENSIVE METABOLIC PANEL
ALT: 24 U/L (ref 0–35)
AST: 29 U/L (ref 0–37)
Albumin: 4.4 g/dL (ref 3.5–5.2)
Alkaline Phosphatase: 114 U/L (ref 39–117)
BUN: 20 mg/dL (ref 6–23)
CO2: 22 mEq/L (ref 19–32)
Calcium: 10.1 mg/dL (ref 8.4–10.5)
Chloride: 101 mEq/L (ref 96–112)
Creatinine, Ser: 0.72 mg/dL (ref 0.50–1.10)
GFR calc Af Amer: >90 mL/min (ref >90)
GFR calc non Af Amer: >90 mL/min (ref >90)
Glucose, Bld: 83 mg/dL (ref 70–99)
Potassium: 4 mEq/L (ref 3.5–5.1)
Sodium: 137 mEq/L (ref 135–145)
Total Bilirubin: 0.3 mg/dL (ref 0.3–1.2)
Total Protein: 7.9 g/dL (ref 6.0–8.3)

## 2012-10-22 LAB — PROTIME-INR
INR: 0.97 (ref 0.00–1.49)
Prothrombin Time: 12.8 seconds (ref 11.6–15.2)

## 2012-10-22 LAB — APTT: aPTT: 33 seconds (ref 24–37)

## 2012-10-22 LAB — TROPONIN I: Troponin I: <0.3 ng/mL (ref <0.30)

## 2012-10-22 MED ORDER — ENOXAPARIN SODIUM 40 MG/0.4ML ~~LOC~~ SOLN: 40.0000 mg | SUBCUTANEOUS | Status: DC

## 2012-10-22 MED ORDER — NEBIVOLOL HCL 2.5 MG PO TABS
5.0000 mg | ORAL_TABLET | Freq: Every morning | ORAL | Status: DC
  Administered 2012-10-23: 5 mg via ORAL
  Filled 2012-10-22 (×2): qty 1

## 2012-10-22 MED ORDER — ACETAMINOPHEN 325 MG PO TABS
650.0000 mg | ORAL_TABLET | Freq: Once | ORAL | Status: AC
  Administered 2012-10-22: 650 mg via ORAL
  Filled 2012-10-22: qty 2

## 2012-10-22 MED ORDER — METOPROLOL TARTRATE 25 MG PO TABS
25.0000 mg | ORAL_TABLET | Freq: Two times a day (BID) | ORAL | Status: DC
  Administered 2012-10-22 – 2012-10-23 (×2): 25 mg via ORAL
  Filled 2012-10-22 (×3): qty 1

## 2012-10-22 MED ORDER — OMEGA-3-ACID ETHYL ESTERS 1 G PO CAPS
2.0000 g | ORAL_CAPSULE | Freq: Every morning | ORAL | Status: DC
  Administered 2012-10-23 – 2012-10-26 (×4): 2 g via ORAL
  Filled 2012-10-22 (×7): qty 2

## 2012-10-22 MED ORDER — SODIUM CHLORIDE 0.9 % IV SOLN
1000.0000 mL | INTRAVENOUS | Status: DC
  Administered 2012-10-23: 1000 mL via INTRAVENOUS

## 2012-10-22 MED ORDER — ACETAMINOPHEN 325 MG PO TABS
650.0000 mg | ORAL_TABLET | ORAL | Status: DC | PRN
  Administered 2012-10-23: 650 mg via ORAL
  Filled 2012-10-22 (×2): qty 2

## 2012-10-22 MED ORDER — ASPIRIN 81 MG PO CHEW
324.0000 mg | CHEWABLE_TABLET | Freq: Once | ORAL | Status: AC
  Administered 2012-10-22: 324 mg via ORAL
  Filled 2012-10-22: qty 4

## 2012-10-22 MED ORDER — SODIUM CHLORIDE 0.9 % IV SOLN
1000.0000 mL | INTRAVENOUS | Status: DC
  Administered 2012-10-22: 1000 mL via INTRAVENOUS

## 2012-10-22 MED ORDER — ASPIRIN 81 MG PO CHEW
324.0000 mg | CHEWABLE_TABLET | ORAL | Status: AC
  Filled 2012-10-22: qty 4

## 2012-10-22 MED ORDER — ASPIRIN 300 MG RE SUPP
300.0000 mg | RECTAL | Status: AC
  Filled 2012-10-22: qty 1

## 2012-10-22 MED ORDER — NITROGLYCERIN 0.4 MG SL SUBL
0.4000 mg | SUBLINGUAL_TABLET | SUBLINGUAL | Status: DC | PRN
  Administered 2012-10-23 – 2012-10-26 (×7): 0.4 mg via SUBLINGUAL
  Filled 2012-10-22 (×4): qty 25

## 2012-10-22 MED ORDER — SODIUM CHLORIDE (HYPERTONIC) 5 % OP SOLN
1.0000 [drp] | Freq: Three times a day (TID) | OPHTHALMIC | Status: DC
  Administered 2012-10-24 – 2012-10-26 (×6): 1 [drp] via OPHTHALMIC
  Filled 2012-10-22 (×2): qty 15

## 2012-10-22 MED ORDER — SIMVASTATIN 40 MG PO TABS
40.0000 mg | ORAL_TABLET | Freq: Every day | ORAL | Status: DC
  Filled 2012-10-22: qty 1

## 2012-10-22 MED ORDER — ONDANSETRON HCL 4 MG/2ML IJ SOLN: 4.0000 mg | Freq: Four times a day (QID) | INTRAMUSCULAR | Status: DC | PRN

## 2012-10-22 MED ORDER — CLONAZEPAM 1 MG PO TABS
1.0000 mg | ORAL_TABLET | Freq: Three times a day (TID) | ORAL | Status: DC | PRN
  Administered 2012-10-22 – 2012-10-23 (×2): 1 mg via ORAL
  Filled 2012-10-22 (×2): qty 2

## 2012-10-22 MED ORDER — NITROGLYCERIN 0.4 MG SL SUBL
0.4000 mg | SUBLINGUAL_TABLET | SUBLINGUAL | Status: DC | PRN
  Administered 2012-10-22 (×2): 0.4 mg via SUBLINGUAL
  Filled 2012-10-22 (×2): qty 25

## 2012-10-22 MED ORDER — SIMVASTATIN 20 MG PO TABS: 20.0000 mg | ORAL_TABLET | Freq: Every day | ORAL | Status: DC

## 2012-10-22 MED ORDER — SODIUM CHLORIDE 0.9 % IV SOLN
INTRAVENOUS | Status: DC
  Administered 2012-10-22: 23:00:00 via INTRAVENOUS

## 2012-10-22 MED ORDER — ENOXAPARIN SODIUM 40 MG/0.4ML ~~LOC~~ SOLN
40.0000 mg | SUBCUTANEOUS | Status: DC
  Administered 2012-10-22: 40 mg via SUBCUTANEOUS
  Filled 2012-10-22 (×2): qty 0.4

## 2012-10-22 MED ORDER — DICLOFENAC SODIUM 50 MG PO TBEC
50.0000 mg | DELAYED_RELEASE_TABLET | Freq: Two times a day (BID) | ORAL | Status: DC
  Administered 2012-10-22 – 2012-10-26 (×7): 50 mg via ORAL
  Filled 2012-10-22 (×14): qty 1

## 2012-10-22 MED ORDER — ASPIRIN EC 81 MG PO TBEC
81.0000 mg | DELAYED_RELEASE_TABLET | Freq: Every day | ORAL | Status: DC
  Administered 2012-10-23 – 2012-10-26 (×4): 81 mg via ORAL
  Filled 2012-10-22 (×4): qty 1

## 2012-10-22 MED ORDER — DICLOFENAC SODIUM 50 MG PO TBEC
DELAYED_RELEASE_TABLET | ORAL | Status: AC
  Filled 2012-10-22: qty 1

## 2012-10-22 MED ORDER — ADULT MULTIVITAMIN W/MINERALS CH
1.0000 | ORAL_TABLET | Freq: Every morning | ORAL | Status: DC
  Administered 2012-10-23 – 2012-10-26 (×4): 1 via ORAL
  Filled 2012-10-22 (×4): qty 1

## 2012-10-22 MED ORDER — SODIUM CHLORIDE 0.9 % IJ SOLN
INTRAMUSCULAR | Status: AC
  Administered 2012-10-23
  Filled 2012-10-22: qty 3

## 2012-10-22 NOTE — ED Notes (Signed)
Per Dr. Ignacia Palma, the EKG he ordered on 22:09 is supposed to be performed in the morning. Nurse notified.

## 2012-10-22 NOTE — ED Notes (Signed)
After second Nitro patient states relief of chest pain now 1/10. Pt reports headache at this time. Dr Ignacia Palma aware.

## 2012-10-22 NOTE — ED Notes (Signed)
States pain 4/10 at this time in chest, will give 2nd nitro tab per PRN order

## 2012-10-22 NOTE — ED Notes (Signed)
Pt comlpains of chest pain starting about 2 hours PTA. Pt states pain to be in L chest and radiates to L shoulder. States pain in worse with a deep breath, not painful to palpation. No other complaints at this time. NSR on monitor

## 2012-10-22 NOTE — ED Provider Notes (Signed)
History     CSN: [id number]  Arrival date & time 10/22/12  1900   First MD Initiated Contact with Patient 10/22/12 1926      Chief Complaint  Patient presents with  . Chest Pain    (Consider location/radiation/quality/duration/timing/severity/associated sxs/prior treatment) HPI Comments: The patient is a 51-year-old wasn't who was driving home from work, developed pain in the Center for chest. The pain radiated to her left shoulder and left arm. This started an hour and a half ago. She got home and sat on the sofa, seemed to collapse, but was not unconscious. She was breathing very heavily and couldn't get her breath. Therefore her husband brought her to [facility] ED for evaluation. She has a history of mitral valve prolapse dating back to 20. She's no longer on any medication for that. There is no history of coronary artery disease. She had breast cancer in 2006 and has done well with that. She's also had number of operations including knee surgery, cervical disc surgery, and hysterectomy.  Patient is a 51 y.o. female presenting with chest pain. The history is provided by the patient and medical records. No language interpreter was used.  Chest Pain The chest pain began 1 - 2 hours ago. Episode Length: Continuously is feeling pain in her chest. Chest pain occurs constantly. The chest pain is improving. The pain is associated with breathing. At its most intense, the pain is at 7/10. The pain is currently at 5/10. The severity of the pain is moderate. The quality of the pain is described as sharp. The pain radiates to the left arm. Chest pain is worsened by deep breathing. Primary symptoms include shortness of breath. Pertinent negatives for primary symptoms include no fever and no palpitations. She tried nothing for the symptoms. Risk factors include smoking/tobacco exposure.  Her past medical history is significant for mitral valve prolapse.  Pertinent negatives for past medical history  include no aortic aneurysm.  Procedure history is positive for echocardiogram. 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 Fuchs' corneal dystrophy   . HTN (hypertension)   . High cholesterol   . Hx of mitral valve prolapse 1980  . GERD (gastroesophageal reflux disease)   . Arthritis   . Anxiety   . Breast cancer 02/22/2012    right T1c, N0    Past Surgical History  Procedure Date  . Carpal tunnel r/l 1992  . Ray  cage fusion 1997  . A cervical spine fusion x2 since last visit   . Elbow surgery  tennis elbow release 2012  . Abdominal hysterectomy 1999  . Sentinel lymph node biopsy 10/30/2002    right  . Ovarian cyst removal     prior to hysterectomy, right  . Breast lumpectomy 10/30/2002    right    Family History  Problem Relation Age of Onset  . Diabetes Mother   . Lung cancer Father   . Diabetes Father   . Heart disease Father   . Brain cancer Brother   . Lung cancer Maternal Grandfather   . Heart disease Paternal Grandfather   . Anesthesia problems Neg Hx   . Hypotension Neg Hx   . Malignant hyperthermia Neg Hx   . Pseudochol deficiency Neg Hx     History  Substance Use Topics  . Smoking status: Current Every Day Smoker -- 0.2 packs/day for 35  years    Types: Cigarettes    Last Attempt to Quit: 06/30/2001  . Smokeless tobacco: Not on file  . Alcohol Use: Yes     Comment: rare    OB History    Grav Para Term Preterm Abortions TAB SAB Ect Mult Living                  Review of Systems  Constitutional: Negative.  Negative for fever and chills.  HENT: Negative.   Eyes: Negative.   Respiratory: Positive for shortness of breath.   Cardiovascular: Positive for chest pain. Negative for palpitations and leg swelling.  Gastrointestinal: Negative.   Genitourinary: Negative.   Musculoskeletal: Negative.   Skin: Negative.     Neurological: Negative.   Psychiatric/Behavioral: The patient is nervous/anxious.     Allergies  Bee venom; Haloperidol lactate; and Meperidine hcl  Home Medications   Current Outpatient Rx  Name  Route  Sig  Dispense  Refill  . CLONAZEPAM 1 MG PO TABS      1 mg 3 (three) times daily as needed. For anxiety         . DICLOFENAC POTASSIUM 50 MG PO TABS      TAKE ONE TABLET BY MOUTH TWICE DAILY   60 tablet   5   . EPINEPHRINE 0.3 MG/0.3ML IJ DEVI   Intramuscular   Inject 0.3 mg into the muscle as needed. For bee venom         . OMEGA-3 FATTY ACIDS 1000 MG PO CAPS   Oral   Take 2 g by mouth every morning.         . ADULT MULTIVITAMIN W/MINERALS CH   Oral   Take 1 tablet by mouth every morning.         . NEBIVOLOL HCL 5 MG PO TABS   Oral   Take 5 mg by mouth every morning.          Marland Kitchen PRESCRIPTION MEDICATION      Eye drops, patient is bringing them in with her on Monday for periop appt., no record of this medication in patients pharmacy         . SIMVASTATIN 40 MG PO TABS   Oral   Take 40 mg by mouth every morning.          Marland Kitchen SODIUM CHLORIDE (HYPERTONIC) 5 % OP SOLN   Both Eyes   Place 1 drop into both eyes 3 (three) times daily.           BP 146/87  Pulse 82  Temp 97.9 F (36.6 C) (Oral)  Resp 20  Ht 5\' 4"  (1.626 m)  Wt 168 lb (76.204 kg)  BMI 28.84 kg/m2  SpO2 99%  Physical Exam  Nursing note and vitals reviewed. Constitutional: She is oriented to person, place, and time. She appears well-developed and well-nourished.       In moderate distress complaining of chest pain.  HENT:  Head: Normocephalic and atraumatic.  Eyes: Conjunctivae normal and EOM are normal. Pupils are equal, round, and reactive to light. No scleral icterus.  Neck: Normal range of motion. Neck supple.  Cardiovascular: Normal rate, regular rhythm and normal heart sounds.   Pulmonary/Chest: Effort normal and breath sounds normal.  Abdominal: Soft. Bowel sounds are  normal. She exhibits no distension. There is no tenderness.  Musculoskeletal: Normal range of motion. She exhibits no edema and no tenderness.  Neurological: She is alert and oriented to person, place, and time.  No sensory or motor deficit.  Skin: Skin is warm and dry.  Psychiatric: She has a normal mood and affect. Her behavior is normal.    ED Course  Procedures (including critical care time)   Labs Reviewed  TROPONIN I  CBC WITH DIFFERENTIAL  COMPREHENSIVE METABOLIC PANEL   2:13 PM  Date: 10/22/2012  Rate: 72  Rhythm: normal sinus rhythm  QRS Axis: normal  Intervals: normal QRS:  Poor R wave progression in precordial leads suggests possible old anterior myocardial infarction.  ST/T Wave abnormalities: normal  Conduction Disutrbances:none  Narrative Interpretation: Abnormal EKG  Old EKG Reviewed: changes noted--poor R wave progression in precordial leads today is more notable than on tracing of 04/28/2012.  7:47 PM Pt seen --> physical exam performed.  Old charts reviewed.  Lab workup for chest pain ordered.  ASA, NTG. Ordered.  8:30 PM Chest pain rated at a 2 following NTG, but has headache now.  Rx with po Tylenol.   9:28 PM Case discussed with Dr. Felecia Shelling, admit to telemetry for observation of chest pain.  1. Chest pain        Carleene Cooper III, MD 10/22/12 2134

## 2012-10-22 NOTE — Progress Notes (Signed)
ANTICOAGULATION CONSULT NOTE - Initial Consult  Pharmacy Consult for Lovenox Indication: VTE prophylaxis  Allergies  Allergen Reactions  . Bee Venom Anaphylaxis  . Haloperidol Lactate Other (See Comments)    Convulsions   . Meperidine Hcl Hives    Patient Measurements: Height: 5\' 4"  (162.6 cm) Weight: 168 lb (76.204 kg) IBW/kg (Calculated) : 54.7  Heparin Dosing Weight: 76 kg   Vital Signs: Temp: 97.9 F (36.6 C) (11/06 1904) Temp src: Oral (11/06 1904) BP: 101/62 mmHg (11/06 2100) Pulse Rate: 84  (11/06 2100)  Labs:  Basename 10/22/12 1947 10/22/12 1920  HGB -- 14.5  HCT -- 40.7  PLT -- 344  APTT 33 --  LABPROT 12.8 --  INR 0.97 --  HEPARINUNFRC -- --  CREATININE -- 0.72  CKTOTAL -- --  CKMB -- --  TROPONINI -- <0.30    Estimated Creatinine Clearance: 83.1 ml/min (by C-G formula based on Cr of 0.72).   Medical History: Past Medical History  Diagnosis Date  . Fuchs' corneal dystrophy   . HTN (hypertension)   . High cholesterol   . Hx of mitral valve prolapse 1980  . GERD (gastroesophageal reflux disease)   . Arthritis   . Anxiety   . Breast cancer 02/22/2012    right T1c, N0    Medications:  Scheduled:    . [COMPLETED] acetaminophen  650 mg Oral Once  . [COMPLETED] aspirin  324 mg Oral Once  . diclofenac  50 mg Oral BID  . enoxaparin (LOVENOX) injection  40 mg Subcutaneous Q24H  . multivitamin with minerals  1 tablet Oral q morning - 10a  . nebivolol  5 mg Oral q morning - 10a  . omega-3 acid ethyl esters  2 g Oral q morning - 10a  . simvastatin  40 mg Oral q1800  . sodium chloride  1 drop Both Eyes TID    Assessment: Patient weight > 45 KG CrCl > 30 ml/min  Goal of Therapy:  Lovenox prophylaxis dosing Monitor platelets by anticoagulation protocol: Yes   Plan:  Lovenox 40 mg SQ every 24 hours Monitor renal function Monitor CBC / platelets  Raquel James, Nataliyah Packham Bennett 10/22/2012,10:21 PM

## 2012-10-22 NOTE — ED Notes (Signed)
Chest pain , onset 1 hour pta, ,lt arm pain, sob

## 2012-10-22 NOTE — ED Notes (Signed)
Called to give report, was told RN would call back shortly.

## 2012-10-23 ENCOUNTER — Ambulatory Visit (HOSPITAL_COMMUNITY): Admit: 2012-10-23 | Payer: Self-pay | Admitting: Cardiology

## 2012-10-23 ENCOUNTER — Encounter (HOSPITAL_COMMUNITY)
Admission: EM | Disposition: A | Discharge status: Home / Self Care | Payer: Self-pay | Source: Home / Self Care | Attending: Emergency Medicine

## 2012-10-23 ENCOUNTER — Inpatient Hospital Stay (HOSPITAL_COMMUNITY): Payer: Managed Care, Other (non HMO)

## 2012-10-23 ENCOUNTER — Encounter (HOSPITAL_COMMUNITY): Payer: Self-pay | Admitting: Radiology

## 2012-10-23 DIAGNOSIS — I341 Nonrheumatic mitral (valve) prolapse: Secondary | ICD-10-CM | POA: Diagnosis present

## 2012-10-23 DIAGNOSIS — I2510 Atherosclerotic heart disease of native coronary artery without angina pectoris: Secondary | ICD-10-CM

## 2012-10-23 DIAGNOSIS — R0789 Other chest pain: Secondary | ICD-10-CM

## 2012-10-23 HISTORY — PX: LEFT HEART CATHETERIZATION WITH CORONARY ANGIOGRAM: SHX5451

## 2012-10-23 LAB — CBC
HCT: 38.3 % (ref 36.0–46.0)
Hemoglobin: 13.2 g/dL (ref 12.0–15.0)
MCH: 30.1 pg (ref 26.0–34.0)
MCHC: 34.5 g/dL (ref 30.0–36.0)
MCV: 87.4 fL (ref 78.0–100.0)
Platelets: 287 10*3/uL (ref 150–400)
RBC: 4.38 MIL/uL (ref 3.87–5.11)
RDW: 13 % (ref 11.5–15.5)
WBC: 9.1 10*3/uL (ref 4.0–10.5)

## 2012-10-23 LAB — PROTIME-INR
INR: 1.06 (ref 0.00–1.49)
Prothrombin Time: 13.7 seconds (ref 11.6–15.2)

## 2012-10-23 LAB — BASIC METABOLIC PANEL
BUN: 20 mg/dL (ref 6–23)
CO2: 25 mEq/L (ref 19–32)
Calcium: 9.1 mg/dL (ref 8.4–10.5)
Chloride: 105 mEq/L (ref 96–112)
Creatinine, Ser: 0.73 mg/dL (ref 0.50–1.10)
GFR calc Af Amer: >90 mL/min (ref >90)
GFR calc non Af Amer: >90 mL/min (ref >90)
Glucose, Bld: 91 mg/dL (ref 70–99)
Potassium: 4.1 mEq/L (ref 3.5–5.1)
Sodium: 138 mEq/L (ref 135–145)

## 2012-10-23 LAB — D-DIMER, QUANTITATIVE: D-Dimer, Quant: 1.57 ug/mL-FEU — ABNORMAL HIGH (ref 0.00–0.48)

## 2012-10-23 LAB — TROPONIN I
Troponin I: <0.3 ng/mL (ref <0.30)
Troponin I: <0.3 ng/mL (ref <0.30)

## 2012-10-23 LAB — LIPID PANEL
Cholesterol: 207 mg/dL — ABNORMAL HIGH (ref 0–200)
HDL: 33 mg/dL — ABNORMAL LOW (ref >39)
LDL Cholesterol: 110 mg/dL — ABNORMAL HIGH (ref 0–99)
Total CHOL/HDL Ratio: 6.3 RATIO
Triglycerides: 319 mg/dL — ABNORMAL HIGH (ref <150)
VLDL: 64 mg/dL — ABNORMAL HIGH (ref 0–40)

## 2012-10-23 SURGERY — LEFT HEART CATHETERIZATION WITH CORONARY ANGIOGRAM
Anesthesia: LOCAL

## 2012-10-23 MED ORDER — ASPIRIN 81 MG PO CHEW
324.0000 mg | CHEWABLE_TABLET | ORAL | Status: AC
  Administered 2012-10-23: 324 mg via ORAL

## 2012-10-23 MED ORDER — SIMVASTATIN 40 MG PO TABS: 40.0000 mg | ORAL_TABLET | Freq: Every day | ORAL | Status: DC

## 2012-10-23 MED ORDER — SODIUM CHLORIDE 0.9 % IV SOLN
1.0000 mL/kg/h | INTRAVENOUS | Status: DC
  Administered 2012-10-23: 1 mL/kg/h via INTRAVENOUS

## 2012-10-23 MED ORDER — NEBIVOLOL HCL 10 MG PO TABS
10.0000 mg | ORAL_TABLET | Freq: Every morning | ORAL | Status: DC
  Administered 2012-10-24 – 2012-10-26 (×3): 10 mg via ORAL
  Filled 2012-10-23 (×3): qty 1

## 2012-10-23 MED ORDER — VERAPAMIL HCL 2.5 MG/ML IV SOLN
INTRAVENOUS | Status: AC
  Filled 2012-10-23: qty 2

## 2012-10-23 MED ORDER — HEPARIN (PORCINE) IN NACL 2-0.9 UNIT/ML-% IJ SOLN
INTRAMUSCULAR | Status: AC
  Filled 2012-10-23: qty 1000

## 2012-10-23 MED ORDER — MIDAZOLAM HCL 2 MG/2ML IJ SOLN
INTRAMUSCULAR | Status: AC
  Filled 2012-10-23: qty 2

## 2012-10-23 MED ORDER — SODIUM CHLORIDE 0.9 % IJ SOLN: 3.0000 mL | INTRAMUSCULAR | Status: DC | PRN

## 2012-10-23 MED ORDER — NITROGLYCERIN 2 % TD OINT
0.5000 [in_us] | TOPICAL_OINTMENT | Freq: Four times a day (QID) | TRANSDERMAL | Status: DC
  Administered 2012-10-23: 0.5 [in_us] via TOPICAL
  Filled 2012-10-23: qty 1

## 2012-10-23 MED ORDER — MORPHINE SULFATE 4 MG/ML IJ SOLN
2.0000 mg | INTRAMUSCULAR | Status: DC | PRN
  Administered 2012-10-23 – 2012-10-25 (×5): 2 mg via INTRAVENOUS
  Filled 2012-10-23 (×4): qty 1

## 2012-10-23 MED ORDER — SODIUM CHLORIDE 0.9 % IJ SOLN: 3.0000 mL | Freq: Two times a day (BID) | INTRAMUSCULAR | Status: DC

## 2012-10-23 MED ORDER — SODIUM CHLORIDE 0.9 % IV SOLN: 250.0000 mL | INTRAVENOUS | Status: DC | PRN

## 2012-10-23 MED ORDER — PANTOPRAZOLE SODIUM 40 MG PO TBEC
40.0000 mg | DELAYED_RELEASE_TABLET | Freq: Every day | ORAL | Status: DC
  Administered 2012-10-23 – 2012-10-26 (×4): 40 mg via ORAL
  Filled 2012-10-23 (×2): qty 1

## 2012-10-23 MED ORDER — SODIUM CHLORIDE 0.9 % IV SOLN: 1.0000 mL/kg/h | INTRAVENOUS | Status: DC

## 2012-10-23 MED ORDER — LIDOCAINE HCL (PF) 1 % IJ SOLN
INTRAMUSCULAR | Status: AC
  Filled 2012-10-23: qty 30

## 2012-10-23 MED ORDER — HEPARIN SODIUM (PORCINE) 1000 UNIT/ML IJ SOLN
INTRAMUSCULAR | Status: AC
  Filled 2012-10-23: qty 1

## 2012-10-23 MED ORDER — FENTANYL CITRATE 0.05 MG/ML IJ SOLN
INTRAMUSCULAR | Status: AC
  Filled 2012-10-23: qty 2

## 2012-10-23 MED ORDER — NITROGLYCERIN 0.2 MG/ML ON CALL CATH LAB
INTRAVENOUS | Status: AC
  Filled 2012-10-23: qty 1

## 2012-10-23 MED ORDER — MORPHINE SULFATE 2 MG/ML IJ SOLN
INTRAMUSCULAR | Status: AC
  Filled 2012-10-23: qty 1

## 2012-10-23 MED ORDER — PNEUMOCOCCAL VAC POLYVALENT 25 MCG/0.5ML IJ INJ
0.5000 mL | INJECTION | INTRAMUSCULAR | Status: AC
  Administered 2012-10-24: 0.5 mL via INTRAMUSCULAR
  Filled 2012-10-23 (×2): qty 0.5

## 2012-10-23 MED ORDER — SIMVASTATIN 40 MG PO TABS
40.0000 mg | ORAL_TABLET | Freq: Every day | ORAL | Status: DC
  Administered 2012-10-23 – 2012-10-26 (×4): 40 mg via ORAL
  Filled 2012-10-23 (×4): qty 1

## 2012-10-23 MED ORDER — IOHEXOL 350 MG/ML SOLN
100.0000 mL | Freq: Once | INTRAVENOUS | Status: AC | PRN
  Administered 2012-10-23: 23:00:00 100 mL via INTRAVENOUS

## 2012-10-23 NOTE — H&P (Signed)
Rachel Sutton MRN: [id number] DOB/AGE: 03/16/1961 51 y.o. Primary Care Physician:HAWKINS,EDWARD L, MD Admit date: 10/22/2012 Chief Complaint:  Chest Pain HPI:  This is a 51 years old female with history of mitral valve prolapse, hypertension and hyperlipidemia came to ER with the above complaint. The rain is on the lt side of the chest radiating to the lt arm area. Patient felt better after she was given nitroglycerine. Her initial EKG and cardiac enzyme showed no acute changes. Patient is admitted under telemetry for further evaluation. No history of headache, fever, cough, shortness of breath, nausea or vomiting.   Past Medical History  Diagnosis Date  . Fuchs' corneal dystrophy   . HTN (hypertension)   . High cholesterol   . Hx of mitral valve prolapse 1980  . GERD (gastroesophageal reflux disease)   . Arthritis   . Anxiety   . Breast cancer 02/22/2012    right T1c, N0   Past Surgical History  Procedure Date  . Carpal tunnel r/l 1992  . Ray  cage fusion 1997  . A cervical spine fusion x2 since last visit   . Elbow surgery  tennis elbow release 2012  . Abdominal hysterectomy 1999  . Sentinel lymph node biopsy 10/30/2002    right  . Ovarian cyst removal     prior to hysterectomy, right  . Breast lumpectomy 10/30/2002    right        Family History  Problem Relation Age of Onset  . Diabetes Mother   . Lung cancer Father   . Diabetes Father   . Heart disease Father   . Brain cancer Brother   . Lung cancer Maternal Grandfather   . Heart disease Paternal Grandfather   . Anesthesia problems Neg Hx   . Hypotension Neg Hx   . Malignant hyperthermia Neg Hx   . Pseudochol deficiency Neg Hx     Social History:  reports that she has been smoking Cigarettes.  She has a 8.75 pack-year smoking history. She does not have any smokeless tobacco history on file. She reports that she drinks alcohol. She reports that she does not use illicit drugs.   Allergies:  Allergies    Allergen Reactions  . Bee Venom Anaphylaxis  . Haloperidol Lactate Other (See Comments)    Convulsions   . Meperidine Hcl Hives    Medications Prior to Admission  Medication Sig Dispense Refill  . clonazePAM (KLONOPIN) 1 MG tablet 1 mg 3 (three) times daily as needed. For anxiety      . diclofenac (VOLTAREN) 50 MG EC tablet Take 50 mg by mouth 2 (two) times daily.      . fish oil-omega-3 fatty acids 1000 MG capsule Take 2 g by mouth every morning.      . Multiple Vitamin (MULITIVITAMIN WITH MINERALS) TABS Take 1 tablet by mouth every morning.      . nebivolol (BYSTOLIC) 5 MG tablet Take 5 mg by mouth every morning.       . simvastatin (ZOCOR) 40 MG tablet Take 40 mg by mouth every morning.       . sodium chloride (MURO 128) 5 % ophthalmic solution Place 1 drop into both eyes 3 (three) times daily.      Marland Kitchen EPINEPHrine (EPI-PEN) 0.3 mg/0.3 mL DEVI Inject 0.3 mg into the muscle as needed. For bee venom           WUJ:WJXBJ from the symptoms mentioned above,there are no other symptoms referable to all systems reviewed.  Physical Exam: Blood pressure 101/65, pulse 72, temperature 97.7 F (36.5 C), temperature source Oral, resp. rate 20, height 5\' 4"  (1.626 m), weight 78.9 kg (173 lb 15.1 oz), SpO2 97.00%. General Condition- Alert, awake and not in distress HE ENT- Pupils equal and reactive, neck supple Chest- good air entry, clear  Lung field ABD- soft and lax, bowel sound           No mass or organomegaly EXT- no leg edema    Basename 10/23/12 0506 10/22/12 1920  WBC 9.1 10.1  NEUTROABS -- 4.0  HGB 13.2 14.5  HCT 38.3 40.7  MCV 87.4 86.0  PLT 287 344    Basename 10/23/12 0506 10/22/12 1920  NA 138 137  K 4.1 4.0  CL 105 101  CO2 25 22  GLUCOSE 91 83  BUN 20 20  CREATININE 0.73 0.72  CALCIUM 9.1 10.1  MG -- --  lablast2(ast:2,ALT:2,alkphos:2,bilitot:2,prot:2,albumin:2)@    No results found for this or any previous visit (from the past 240 hour(s)).   Dg Chest  Portable 1 View  10/22/2012  *RADIOLOGY REPORT*  Clinical Data: Chest pain  PORTABLE CHEST - 1 VIEW  Comparison:  radiograph 07/02/2007  Findings: Normal mediastinum and cardiac silhouette.  Normal pulmonary  vasculature.  No evidence of effusion, infiltrate, or pneumothorax.  No acute bony abnormality.  IMPRESSION: No acute cardiopulmonary process.   Original Report Authenticated By: Genevive Bi, M.D.    Impression: 1. Chest pain R/O MI 2. H/O mitral Valve prolapse 3. Hypertension 4. Hyperlipedemia Active Problems:  * No active hospital problems. *      Plan: - admit under telemetry - Serial ekg and cardiac enzymes - continue regular treatment.       Zyrell Carmean  10/23/2012, 7:42 AM

## 2012-10-23 NOTE — Progress Notes (Signed)
C/o CP 5 out of 1 to 10 call to cath lab to notify Dr Sw[place] said to treat with NTG SL taking pt straight to cath lab. Family here with pt.

## 2012-10-23 NOTE — Progress Notes (Signed)
TR BAND REMOVAL  LOCATION:    right radial  DEFLATED PER PROTOCOL:    yes  TIME BAND OFF / DRESSING APPLIED:    1930   SITE UPON ARRIVAL:    Level 0  SITE AFTER BAND REMOVAL:    Level 0  REVERSE ALLEN'S TEST:     positive  CIRCULATION SENSATION AND MOVEMENT:    Within Normal Limits   yes  COMMENTS:   Tolerated procedure well 

## 2012-10-23 NOTE — Progress Notes (Signed)
Pt transferred via care link to [facility] for procedure [facility]

## 2012-10-23 NOTE — Interval H&P Note (Signed)
History and Physical Interval Note:  10/23/2012 4:39 PM  Rachel Sutton  has presented today for surgery, with the diagnosis of cp  The various methods of treatment have been discussed with the patient and family. After consideration of risks, benefits and other options for treatment, the patient has consented to  Procedure(s) (LRB) with comments: LEFT HEART CATHETERIZATION WITH CORONARY ANGIOGRAM (N/A) as a surgical intervention .  The patient's history has been reviewed, patient examined, no change in status, stable for surgery.  I have reviewed the patient's chart and labs.  Questions were answered to the patient's satisfaction.     Theron Arista [facility] 10/23/2012 4:39 PM

## 2012-10-23 NOTE — Consult Note (Signed)
CARDIOLOGY CONSULT NOTE  Patient ID: Rachel Sutton MRN: [id number] DOB/AGE: 1961/12/08 51 y.o.  Admit date: 10/22/2012 Referring Physician: Juanetta Gosling  Primary Herb Grays, MD Primary Cardiologist: Ellis Parents) Charlton Haws MD Reason for Consultation: Chest Pain  Active Problems:  Chest pain at rest  Mitral valve prolapse, nonrheumatic  HPI: Rachel Sutton is a 51 y/o patient with prior cardiac history for mitral valve prolapse, diagnosed in 1985, and followed by a cardiologist in [place]. She has not seen that cardiologist for approximately 10 years. She also has a history of breast carcinoma S/P right lumpectomy with sentinel node dissection. She received Taxotere and Herceptin with Herceptin completed in March 2005 and radiation to the right breast. And is followed by Dr. Donnie Coffin in [place]. The patient has had multiple echocardiograms as a result of her radiation and chemotherapy, with most recent in 2009.    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 This was associated with diaphoresis weakness and shortness of breath. She pulled over for about 5 minutes and the pain became less and but did not go away. The pain continued in a crescendo decrescendo manner until she returned home. On arrival she collapsed on the couch but did not lose consciousness. She continued to have discomfort and her husband brought her to the emergency room.    On arrival to the emergency room the patient's blood pressure was 146/87 with a heart rate of 82 respirations 20 with O2 sat of 99%. She was given nitroglycerin sublingual, and initial troponin was negative at less than 0.30, potassium was 4.0 she was not found to be anemic or dehydrated. Chest x-ray revealed no acute cardiopulmonary process. EKG  revealed T-wave abnormalities anteriorly with no acute ST-T wave elevation. Followup EKG reveals questionable septal infarct. The patient states the pain went away after nitroglycerin was provided, but she had recurrence of an episode last evening requiring nitroglycerin sublingual, and again this morning.     The patient has multiple cardiovascular risk factors to include family history with father,, and 3 brothers with heart disease. She smoked heavily in the past up to 2 and half packs a day, but has weaned herself down to one half pack a day. She has hypertension and hypercholesterolemia. She is currently pain-free but very fatigued. She admits to having some increased shortness of breath, occasional palpitations and fatigue over the last few years.  Review of systems complete and found to be negative unless listed above   Past Medical History  Diagnosis Date  . Fuchs' corneal dystrophy   . HTN (hypertension)   . High cholesterol   . Hx of mitral valve prolapse 1980  . GERD (gastroesophageal reflux disease)   . Arthritis   . Anxiety   . Breast cancer 02/22/2012    right T1c, N0    Family History  Problem Relation Age of Onset  . Diabetes Mother   . Lung cancer Father   . Diabetes Father   . Heart disease Father   . Brain cancer Brother   . Lung cancer Maternal Grandfather   . Heart disease Paternal Grandfather   . Anesthesia problems Neg Hx   . Hypotension Neg Hx   . Malignant hyperthermia Neg Hx   . Pseudochol deficiency Neg Hx  History   Social History  . Marital Status: Married    Spouse Name: N/A    Number of Children: N/A  . Years of Education: N/A   Occupational History  . Payroll officer   Social History Main Topics  . Smoking status: Current Every Day Smoker -- 0.2 packs/day for 35 years    Types: Cigarettes    Last Attempt to Quit: 06/30/2001  . Smokeless tobacco: Not on file  . Alcohol Use: Yes     Comment: rare  . Drug Use: No  . Sexually Active:     Other Topics Concern  . Not on file   Social History Narrative  . No narrative on file    Past Surgical History  Procedure Date  . Carpal tunnel r/l 1992  . Ray  cage fusion 1997  . A cervical spine fusion x2 since last visit   . Elbow surgery  tennis elbow release 2012  . Abdominal hysterectomy 1999  . Sentinel lymph node biopsy 10/30/2002    right  . Ovarian cyst removal     prior to hysterectomy, right  . Breast lumpectomy 10/30/2002    right     Prescriptions prior to admission  Medication Sig Dispense Refill  . clonazePAM (KLONOPIN) 1 MG tablet 1 mg 3 (three) times daily as needed. For anxiety      . diclofenac (VOLTAREN) 50 MG EC tablet Take 50 mg by mouth 2 (two) times daily.      . fish oil-omega-3 fatty acids 1000 MG capsule Take 2 g by mouth every morning.      . Multiple Vitamin (MULITIVITAMIN WITH MINERALS) TABS Take 1 tablet by mouth every morning.      . nebivolol (BYSTOLIC) 5 MG tablet Take 5 mg by mouth every morning.       . simvastatin (ZOCOR) 40 MG tablet Take 40 mg by mouth every morning.       . sodium chloride (MURO 128) 5 % ophthalmic solution Place 1 drop into both eyes 3 (three) times daily.      Marland Kitchen EPINEPHrine (EPI-PEN) 0.3 mg/0.3 mL DEVI Inject 0.3 mg into the muscle as needed. For bee venom        Physical Exam: Blood pressure 109/68, pulse 73, temperature 97.7 F (36.5 C), temperature source Oral, resp. rate 20, height 5\' 4"  (1.626 m), weight 173 lb 15.1 oz (78.9 kg), SpO2 97.00%.   General: Well developed, well nourished, in no acute distress, appears fatigued. Head: Eyes PERRLA, No xanthomas.   Normal cephalic and atramatic  Lungs: Bibasilar crackles, without wheezes, rhonchi or cough. Heart: HRRR S1 S2, without MRG.  Pulses are 2+ & equal.            No carotid bruit. No JVD.  No abdominal bruits. No femoral bruits. Abdomen: Bowel sounds are positive, abdomen soft and non-tender without masses or                  Hernia's noted. Msk:   Back normal, normal gait. Normal strength and tone for age. Extremities: No clubbing, cyanosis or edema.  DP +1 Neuro: Alert and oriented X 3. Psych:  Good affect, responds appropriately  Labs:   Lab Results  Component Value Date   WBC 9.1 10/23/2012   HGB 13.2 10/23/2012   HCT 38.3 10/23/2012   MCV 87.4 10/23/2012   PLT 287 10/23/2012     Lab 10/23/12 0506 10/22/12 1920  NA 138 --  K 4.1 --  CL 105 --  CO2 25 --  BUN 20 --  CREATININE 0.73 --  CALCIUM 9.1 --  PROT -- 7.9  BILITOT -- 0.3  ALKPHOS -- 114  ALT -- 24  AST -- 29  GLUCOSE 91 --   Lab Results  Component Value Date   CKTOTAL 298* 07/01/2007   CKMB 1.9 07/01/2007   TROPONINI <0.30 10/23/2012    Lab Results  Component Value Date   CHOL 207* 10/23/2012   Lab Results  Component Value Date   HDL 33* 10/23/2012   Lab Results  Component Value Date   LDLCALC 110* 10/23/2012   Lab Results  Component Value Date   TRIG 319* 10/23/2012   Lab Results  Component Value Date   CHOLHDL 6.3 10/23/2012   No results found for this basename: LDLDIRECT    Echocardiogram 06/04/2008    SUMMARY   -  Overall left ventricular systolic function was normal. Left         ventricular ejection fraction was estimated , range being 60         % to 65 %. There were no left ventricular regional wall         motion abnormalities.   -  The aortic root was at the upper limits of normal in size.   -  There was a mild mitral valve prolapse. There was mild mitral         annular calcification. There was mild mitral valvular         Regurgitation.  Radiology: Dg Chest Portable 1 View  10/22/2012  *RADIOLOGY REPORT*  Clinical Data: Chest pain  PORTABLE CHEST - 1 VIEW  Comparison:  radiograph 07/02/2007  Findings: Normal mediastinum and cardiac silhouette.  Normal pulmonary  vasculature.  No evidence of effusion, infiltrate, or pneumothorax.  No acute bony abnormality.  IMPRESSION: No acute cardiopulmonary process.   Original Report  Authenticated By: Genevive Bi, M.D.    EKG:NSR with Questionable septal infarct.  ASSESSMENT AND PLAN:  1. Probable Unstable Angina: This patient has multiple cardiovascular risk factors as described above, with recurrent discomfort at rest. Initial troponin is negative x2. She continues to have recurrent discomfort, and therefore I will begin nitroglycerin to be given topically. Echocardiogram has been ordered for LV function and evaluation of mitral valve. W will likely benefit from more intensive cardiac testing, which may lead [place] to send her to [facility] for cardiac catheterization for diagnostic/prognostic purposes. The patient was started on aspirin, enoxaparin 40 mg daily and  metoprolol 25 mg twice a day. I have discussed the possibility of transfer to [facility] for cardiac catheterization but will discuss this further with Dr. Eden Emms who will see the patient today.  2.Mixed hyperlipidemia:  She has elevated total cholesterol, LDL, and triglycerides. She is currently on simvastatin 40 mg daily, and Lovaza She will need to adhere to a low cholesterol diet and increase her exercise once she is discharged.  3. Hypertension: Blood pressure is currently controlled on medications. We will continue the beta blocker for now and consider ACE inhibitor if systolic function is significantly decreased. She will need to adhere to a low-sodium diet.  Signed: Bettey Mare. Lyman Bishop NP [facility] 10/23/2012, 9:40 AM Co-Sign MD  Patient examined chart reviewed. SSCP radiating to left arm Relief with nitro.  Symptoms consistant with angina.  Discussed cath with patient including risks of stroke bleeding contrast allergy and need for emergent surgery Willing to proceed.  Will try to arrange  at [facility] today with Dr [place]  Edyn Qazi

## 2012-10-23 NOTE — Progress Notes (Signed)
Pt transferred here from [facility] for heart cath hhis afternoon. Pt a/o aware she is not to have anything by mouth. Placed on tele monitor and orienye\ted to room and nurse call button.

## 2012-10-23 NOTE — Progress Notes (Signed)
UR Chart Review Completed  

## 2012-10-23 NOTE — Progress Notes (Signed)
D- Pt complained of chest pain 5/10 and shortness of breath at 0020 after ambulating to the bathroom. NTG 0.4 SL given at 0025.  Vitals are stable and EKG performed that does not show ST elevation indicative of a STEMI and wave forms appear unchanged when compared to previous.  Chest pain is 3/10 at 0030 and BP is 99/63 and pt denies complaints of shortness of breath.  0035 Chest pain is resolved and she denies shortness of breath.  A-Dr. Felecia Shelling notified of the above and no new orders were given.  R-Pt in bed now and denies complaint.  Pt instructed to call for further chest pain or assistance.  Vital signs remain WDL and Oxygen saturations are 97% on 2 L/M via nasal cannula.  Nursing staff to continue to monitor.

## 2012-10-23 NOTE — CV Procedure (Signed)
   Cardiac Catheterization Procedure Note  Name: Rachel Sutton MRN: [id number] DOB: 1961/08/30  Procedure: Left Heart Cath, Selective Coronary Angiography, LV angiography  Indication: 51 year old white female with history of hypertension, hyperlipidemia, tobacco abuse, and family history of coronary disease who presents with refractory symptoms of chest pain. Cardiac enzymes have been negative. ECG shows no acute changes.   Procedural Details: The right wrist was prepped, draped, and anesthetized with 1% lidocaine. Using the modified Seldinger technique, a 5 French sheath was introduced into the right radial artery. 3 mg of verapamil was administered through the sheath, weight-based unfractionated heparin was administered intravenously. Standard Judkins catheters were used for selective coronary angiography and left ventriculography. Catheter exchanges were performed over an exchange length guidewire. There were no immediate procedural complications. A TR band was used for radial hemostasis at the completion of the procedure.  The patient was transferred to the post catheterization recovery area for further monitoring.  Procedural Findings: Hemodynamics: AO 138/78 with a mean of 104 mmHg LV 133/14 mmHg  Coronary angiography: Coronary dominance: Left  Left mainstem: Normal  Left anterior descending (LAD): There is a short segment in the mid LAD with mild narrowing up to 20-30%. Otherwise the vessel appears normal. There is a large diagonal branch which is normal.  Left circumflex (LCx): Dominant vessel. Normal.  Right coronary artery (RCA): Small nondominant vessel. Normal.  Left ventriculography: Left ventricular systolic function is normal, LVEF is estimated at 55-65%, there is no significant mitral regurgitation, there is mild posterior prolapse.  Final Conclusions:   1. Minor nonobstructive coronary disease involving a short segment of the mid LAD. 2. Normal left ventricular  function.  Recommendations: Patient continues to have refractory chest pain. We will evaluate further with an echocardiogram and also obtain a CT of the chest to rule out pulmonary embolus. Her d-dimer is elevated.  Theron Arista [facility] 10/23/2012, 5:24 PM

## 2012-10-23 NOTE — Progress Notes (Signed)
She was admitted to the hospital with chest pain. This has been on the left side of her chest and radiates into her left arm. She took nitroglycerin and it helped. She also had what seems like a syncopal episode associated with this. She continues to have pain even this morning. Her troponin level is not positive.  Her vital signs are as recorded. Her pupils are reactive her chest is clear. She does not have any chest wall pain. Her heart is regular. She seems to be in mild acute distress. Her chest is clear.  She has chest discomfort. It is responsive to nitroglycerin. Her troponin is negative so far. She has a history of mitral valve prolapse.  I will asked for cardiology consultation. She will have echocardiogram. She will have a d-dimer and if it is elevated she'll need to have a CT of the chest. Further plans depend on the results of all of the above

## 2012-10-24 DIAGNOSIS — I059 Rheumatic mitral valve disease, unspecified: Secondary | ICD-10-CM

## 2012-10-24 DIAGNOSIS — R079 Chest pain, unspecified: Secondary | ICD-10-CM

## 2012-10-24 MED ORDER — MORPHINE SULFATE 2 MG/ML IJ SOLN
1.0000 mg | Freq: Once | INTRAMUSCULAR | Status: AC
  Administered 2012-10-24: 2 mg via INTRAVENOUS
  Filled 2012-10-24: qty 1

## 2012-10-24 MED ORDER — GI COCKTAIL ~~LOC~~
30.0000 mL | Freq: Once | ORAL | Status: AC
  Administered 2012-10-24: 30 mL via ORAL
  Filled 2012-10-24: qty 30

## 2012-10-24 NOTE — Progress Notes (Signed)
Discussed with Dr. Sw[place]. Given intermittent sx and ongoing GI eval will keep tonight. Medard Decuir PA-C

## 2012-10-24 NOTE — Progress Notes (Signed)
Pt has still had 2 episodes of cp today. Earlier had 5/10 CP relieved after 5 minutes with 1 SL NTG, EKG nonacute. Reoccurred this afternoon 3/10-increasing with no relief with GI cocktail. Again, EKG nonacute - with neg enz, nonobst cath and nonacute EKG, doubt cardiac. Per discussion with Dr. [place], will obtain GI consult. Pt in collections with Dr. Haywood Pao office so he will not see. [facility] called. Brittinie Wherley PA-C

## 2012-10-24 NOTE — Progress Notes (Signed)
TELEMETRY: Reviewed telemetry pt in NSR: Filed Vitals:   10/23/12 1930 10/24/12 0100 10/24/12 0112 10/24/12 0500  BP: 107/50  114/69 115/63  Pulse: 68   64  Temp:  98 F (36.7 C)  97.9 F (36.6 C)  TempSrc:  Axillary  Oral  Resp: 18  15 19   Height:      Weight:    80.4 kg (177 lb 4 oz)  SpO2: 96%   95%    Intake/Output Summary (Last 24 hours) at 10/24/12 0745 Last data filed at 10/24/12 0300  Gross per 24 hour  Intake  98.85 ml  Output   1600 ml  Net -1501.15 ml    SUBJECTIVE States chest pain resolved. Denies SOB.   LABS: Basic Metabolic Panel:  Basename 10/23/12 0506 10/22/12 1920  NA 138 137  K 4.1 4.0  CL 105 101  CO2 25 22  GLUCOSE 91 83  BUN 20 20  CREATININE 0.73 0.72  CALCIUM 9.1 10.1  MG -- --  PHOS -- --   Liver Function Tests:  [facility] 10/22/12 1920  AST 29  ALT 24  ALKPHOS 114  BILITOT 0.3  PROT 7.9  ALBUMIN 4.4   CBC:  Basename 10/23/12 0506 10/22/12 1920  WBC 9.1 10.1  NEUTROABS -- 4.0  HGB 13.2 14.5  HCT 38.3 40.7  MCV 87.4 86.0  PLT 287 344   Cardiac Enzymes:  Basename 10/23/12 1104 10/23/12 0506 10/22/12 1920  CKTOTAL -- -- --  CKMB -- -- --  CKMBINDEX -- -- --  TROPONINI <0.30 <0.30 <0.30   D-Dimer:  Basename 10/23/12 1103  DDIMER 1.57*   Fasting Lipid Panel:  Basename 10/23/12 0506  CHOL 207*  HDL 33*  LDLCALC 110*  TRIG 319*  CHOLHDL 6.3  LDLDIRECT --    Radiology/Studies:  Ct Angio Chest Pe W/cm &/or Wo Cm  10/23/2012  *RADIOLOGY REPORT*  Clinical Data: Chest pain.  Shortness of breath.  Status post cardiac catheterization today.  CT ANGIOGRAPHY CHEST  Technique:  Multidetector CT imaging of the chest using the standard protocol during bolus administration of intravenous contrast. Multiplanar reconstructed images including MIPs were obtained and reviewed to evaluate the vascular anatomy.  Contrast: OMNIPAQUE IOHEXOL 350 MG/ML SOLN  Comparison: Chest radiograph performed 10/22/2012  Findings: There  is no evidence of pulmonary embolus.  Mild right basilar atelectasis is noted.  The lungs are otherwise grossly clear.  There is no evidence of significant focal consolidation, pleural effusion or pneumothorax.  No masses are identified; no abnormal focal contrast enhancement is seen.  The mediastinum is unremarkable in appearance.  No mediastinal lymphadenopathy is seen.  No pericardial effusion is identified. The great vessels are unremarkable in appearance.  No axillary lymphadenopathy is seen.  The visualized portions of the thyroid gland are unremarkable in appearance.  The visualized portions of the liver and spleen are unremarkable. The visualized portions of the pancreas, gallbladder, stomach, adrenal glands and kidneys are within normal limits.  No acute osseous abnormalities are seen.  IMPRESSION:  1.  No evidence of pulmonary embolus. 2.  Mild right basilar atelectasis; lungs otherwise clear.   Original Report Authenticated By: Tonia Ghent, M.D.    Dg Chest Portable 1 View  10/22/2012  *RADIOLOGY REPORT*  Clinical Data: Chest pain  PORTABLE CHEST - 1 VIEW  Comparison:  radiograph 07/02/2007  Findings: Normal mediastinum and cardiac silhouette.  Normal pulmonary  vasculature.  No evidence of effusion, infiltrate, or pneumothorax.  No acute bony abnormality.  IMPRESSION: No acute cardiopulmonary process.   Original Report Authenticated By: Genevive Bi, M.D.    ECG: NSR possible old septal infarct.  PHYSICAL EXAM General: Well developed, well nourished, in no acute distress. Head: Normal Neck: Negative for carotid bruits. JVD not elevated. Lungs: Clear bilaterally to auscultation without wheezes, rales, or rhonchi. Breathing is unlabored. Heart: RRR S1 S2 without murmurs, rubs, or gallops.  Abdomen: Soft, non-tender, non-distended with normoactive bowel sounds. No hepatomegaly. No rebound/guarding. No obvious abdominal masses. Extremities: No clubbing, cyanosis or edema.  Distal pedal  pulses are 2+ and equal bilaterally. Neuro: Alert and oriented X 3. Moves all extremities spontaneously. Psych:  Responds to questions appropriately with a normal affect.  ASSESSMENT AND PLAN: 1. Acute chest pain. Etiology unclear. No evidence of MI. Cardiac cath showed mild nonobstructive disease 20-30% mid LAD. Chest CT negative for PE. No other significant pathology. GB looked normal. ? If sx are related to acid reflux. Will treat with PPI. Plan discharge home today after Echo. F/u with Dr. Juanetta Gosling.  2. Tobacco abuse- counseled on smoking cessation.  3. HTN controlled.  4. Hypercholesterolemia on Zocor/ lovaza  Active Problems:  Chest pain at rest  Mitral valve prolapse, nonrheumatic    Signed, [employer] [place] MD,FACC 10/24/2012 7:51 AM

## 2012-10-24 NOTE — Consult Note (Signed)
Referring Provider: No ref. provider found Primary Care Physician:  Fredirick Maudlin, MD Primary Gastroenterologist:  Dr. Elnoria Howard  Reason for Consultation:  Noncardiac chest pain  HPI: Rachel Sutton is a 51 y.o. female who was seen by Dr. Elnoria Howard in the past for colonoscopy, however, due to payment and collection issues, Dr. Elnoria Howard declined to see the patient in the hospital.  GI has been consulted due to ongoing complaints of chest pain.  This patient was transferred from [facility] and underwent cath without any findings to account for pain.  Despite ongoing pain, her troponins have been repeatedly negative.  CT angio of the chest was negative for PE and showed no other cause of pain.  The plan was to treat the patient with PPI (started last night) and have her follow up as an outpatient, however, she did not want to return home without resolution of this pain or determination of the source.  Labs are unremarkable.  She does take diclofenac 50 mg BID at home for the past year or so as well as aleve every day for the past several months.  She describes the chest pain as intermittent squeezing sensation over her left chest/breast.  Comes randomly and reaches an intensity of 7-8/10 at times.  Began Wednesday on her way home from work with sudden onset.  Denies associated nausea, vomiting, bowel issues, abdominal pain, difficult or painful swallowing.  Admits to only occasional heartburn/reflux when she eats spicy foods.    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  She was given a dose of GI cocktail today, but says that it has not helped her symptoms.   Past Medical History  Diagnosis Date  . Fuchs' corneal dystrophy   . HTN (hypertension)   . High cholesterol   . Hx of mitral valve prolapse 1980  . GERD (gastroesophageal reflux disease)   . Arthritis   . Anxiety   . Breast cancer 02/22/2012    right T1c, N0    Past Surgical History  Procedure Date  . Carpal tunnel  r/l 1992  . Ray  cage fusion 1997  . A cervical spine fusion x2 since last visit   . Elbow surgery  tennis elbow release 2012  . Abdominal hysterectomy 1999  . Sentinel lymph node biopsy 10/30/2002    right  . Ovarian cyst removal     prior to hysterectomy, right  . Breast lumpectomy 10/30/2002    right    Prior to Admission medications   Medication Sig Start Date End Date Taking? Authorizing Provider  clonazePAM (KLONOPIN) 1 MG tablet 1 mg 3 (three) times daily as needed. For anxiety   Yes Historical Provider, MD  diclofenac (VOLTAREN) 50 MG EC tablet Take 50 mg by mouth 2 (two) times daily.   Yes Historical Provider, MD  fish oil-omega-3 fatty acids 1000 MG capsule Take 2 g by mouth every morning.   Yes Historical Provider, MD  Multiple Vitamin (MULITIVITAMIN WITH MINERALS) TABS Take 1 tablet by mouth every morning.   Yes Historical Provider, MD  nebivolol (BYSTOLIC) 5 MG tablet Take 5 mg by mouth every morning.    Yes Historical Provider, MD  simvastatin (ZOCOR) 40 MG tablet Take 40 mg by mouth every morning.    Yes Historical Provider, MD  sodium chloride (MURO 128) 5 % ophthalmic solution Place 1 drop into both eyes 3 (three) times daily.   Yes Historical Provider, MD  EPINEPHrine (EPI-PEN) 0.3 mg/0.3 mL  DEVI Inject 0.3 mg into the muscle as needed. For bee venom    Historical Provider, MD    Current Facility-Administered Medications  Medication Dose Route Frequency Provider Last Rate Last Dose  . 0.9 %  sodium chloride infusion   Intravenous Continuous Carleene Cooper III, MD 150 mL/hr at 10/22/12 2319    . [EXPIRED] 0.9 %  sodium chloride infusion  1 mL/kg/hr Intravenous Continuous Peter M [place], MD 79.4 mL/hr at 10/23/12 1845 1 mL/kg/hr at 10/23/12 1845  . acetaminophen (TYLENOL) tablet 650 mg  650 mg Oral Q4H PRN Carleene Cooper III, MD   650 mg at 10/23/12 0915  . [EXPIRED] aspirin chewable tablet 324 mg  324 mg Oral NOW Carleene Cooper III, MD       Or  . [place] [address] aspirin  suppository 300 mg  300 mg Rectal NOW Carleene Cooper III, MD      . [address] aspirin chewable tablet 324 mg  324 mg Oral Pre-Cath Jodelle Gross, NP   324 mg at 10/23/12 1622  . aspirin EC tablet 81 mg  81 mg Oral Daily Carleene Cooper III, MD   81 mg at 10/24/12 1020  . clonazePAM (KLONOPIN) tablet 1 mg  1 mg Oral TID PRN Avon Gully, MD   1 mg at 10/23/12 2251  . diclofenac (VOLTAREN) EC tablet 50 mg  50 mg Oral BID Avon Gully, MD   50 mg at 10/24/12 1020  . [COMPLETED] fentaNYL (SUBLIMAZE) 0.05 MG/ML injection           . [COMPLETED] gi cocktail (Maalox,Lidocaine,Donnatal)  30 mL Oral Once [employer], PA   30 mL at 10/24/12 1420  . [COMPLETED] heparin 1000 UNIT/ML injection           . [COMPLETED] heparin 2-0.9 UNIT/ML-% infusion           . [COMPLETED] iohexol (OMNIPAQUE) 350 MG/ML injection 100 mL  100 mL Intravenous Once PRN Medication Radiologist, MD   100 mL at 10/23/12 2239  . [COMPLETED] lidocaine (XYLOCAINE) 1 % injection           . [COMPLETED] midazolam (VERSED) 2 MG/2ML injection           . morphine 4 MG/ML injection 2 mg  2 mg Intravenous Q1H PRN Peter M [place], MD   2 mg at 10/23/12 1813  . multivitamin with minerals tablet 1 tablet  1 tablet Oral q morning - 10a Avon Gully, MD   1 tablet at 10/24/12 1020  . nebivolol (BYSTOLIC) tablet 10 mg  10 mg Oral q morning - 10a Peter M [place], MD   10 mg at 10/24/12 1021  . nitroGLYCERIN (NITROSTAT) SL tablet 0.4 mg  0.4 mg Sublingual Q5 Min x 3 PRN Carleene Cooper III, MD   0.4 mg at 10/23/12 1626  . [COMPLETED] nitroGLYCERIN (NTG ON-CALL) 0.2 mg/mL injection           . omega-3 acid ethyl esters (LOVAZA) capsule 2 g  2 g Oral q morning - 10a Avon Gully, MD   2 g at 10/24/12 1355  . ondansetron (ZOFRAN) injection 4 mg  4 mg Intravenous Q6H PRN Carleene Cooper III, MD      . pantoprazole (PROTONIX) EC tablet 40 mg  40 mg Oral QAC supper Peter M [place], MD   40 mg at 10/23/12 1905  . [COMPLETED] pneumococcal 23 valent vaccine  (PNU-IMMUNE) injection 0.5 mL  0.5 mL Intramuscular Tomorrow-1000 Fredirick Maudlin, MD   0.5 mL  at 10/24/12 1355  . simvastatin (ZOCOR) tablet 40 mg  40 mg Oral q1800 Fredirick Maudlin, MD   40 mg at 10/23/12 2250  . sodium chloride (MURO 128) 5 % ophthalmic solution 1 drop  1 drop Both Eyes TID Avon Gully, MD   1 drop at 10/24/12 1036  . [COMPLETED] verapamil (ISOPTIN) 2.5 MG/ML injection           . [DISCONTINUED] 0.9 %  sodium chloride infusion  250 mL Intravenous PRN Jodelle Gross, NP      . [DISCONTINUED] 0.9 %  sodium chloride infusion  1 mL/kg/hr Intravenous Continuous Jodelle Gross, NP 78.9 mL/hr at 10/23/12 1544 1 mL/kg/hr at 10/23/12 1544  . [DISCONTINUED] enoxaparin (LOVENOX) injection 40 mg  40 mg Subcutaneous Q24H Carleene Cooper III, MD   40 mg at 10/22/12 2316  . [DISCONTINUED] metoprolol tartrate (LOPRESSOR) tablet 25 mg  25 mg Oral BID Carleene Cooper III, MD   25 mg at 10/23/12 0950  . [DISCONTINUED] nebivolol (BYSTOLIC) tablet 5 mg  5 mg Oral q morning - 10a Avon Gully, MD   5 mg at 10/23/12 1156  . [DISCONTINUED] nitroGLYCERIN (NITROGLYN) 2 % ointment 0.5 inch  0.5 inch Topical Q6H Jodelle Gross, NP   0.5 inch at 10/23/12 1231  . [DISCONTINUED] simvastatin (ZOCOR) tablet 40 mg  40 mg Oral q1800 Avon Gully, MD      . [DISCONTINUED] simvastatin (ZOCOR) tablet 40 mg  40 mg Oral q1800 Fredirick Maudlin, MD      . [DISCONTINUED] sodium chloride 0.9 % injection 3 mL  3 mL Intravenous Q12H Jodelle Gross, NP      . [DISCONTINUED] sodium chloride 0.9 % injection 3 mL  3 mL Intravenous PRN Jodelle Gross, NP        Allergies as of 10/22/2012 - Review Complete 10/22/2012  Allergen Reaction Noted  . Bee venom Anaphylaxis 04/25/2012  . Haloperidol lactate Other (See Comments) 11/06/2002  . Meperidine hcl Hives 11/06/2002    Family History  Problem Relation Age of Onset  . Diabetes Mother   . Lung cancer Father   . Diabetes Father   . Heart disease Father    . Brain cancer Brother   . Lung cancer Maternal Grandfather   . Heart disease Paternal Grandfather   . Anesthesia problems Neg Hx   . Hypotension Neg Hx   . Malignant hyperthermia Neg Hx   . Pseudochol deficiency Neg Hx     History   Social History  . Marital Status: Married    Spouse Name: N/A    Number of Children: N/A  . Years of Education: N/A   Occupational History  . Not on file.   Social History Main Topics  . Smoking status: Current Every Day Smoker -- 0.2 packs/day for 35 years    Types: Cigarettes    Last Attempt to Quit: 06/30/2001  . Smokeless tobacco: Not on file  . Alcohol Use: Yes     Comment: rare  . Drug Use: No  . Sexually Active:    Other Topics Concern  . Not on file   Social History Narrative  . No narrative on file    Review of Systems: Ten point ROS is O/W negative except as mentioned in HPI.  Physical Exam: Vital signs in last 24 hours: Temp:  [97.7 F (36.5 C)-98.1 F (36.7 C)] 97.7 F (36.5 C) (11/08 1156) Pulse Rate:  [57-80] 80  (11/08 1156) Resp:  [15-26]  18  (11/08 1156) BP: (107-132)/(50-90) 123/68 mmHg (11/08 1156) SpO2:  [94 %-98 %] 95 % (11/08 1156) Weight:  [177 lb 4 oz (80.4 kg)] 177 lb 4 oz (80.4 kg) (11/08 0500) Last BM Date: 10/22/12 General:   Alert,  Well-developed, well-nourished, pleasant and cooperative in NAD. Head:  Normocephalic and atraumatic. Eyes:  Sclera clear, no icterus.  Conjunctiva pink. Ears:  Normal auditory acuity. Mouth:  No deformity or lesions.   Lungs:  Clear throughout to auscultation.  No wheezes, crackles, or rhonchi.  No reproduction of pain on chest wall palpation.  Heart:  Regular rate and rhythm; no murmurs, clicks, rubs,  or gallops. Abdomen:  Soft, nontender, BS active, nonpalp mass or hsm.   Rectal:  Deferred.  Msk:  Symmetrical without gross deformities. Pulses:  Normal pulses noted. Extremities:  Without clubbing or edema. Neurologic:  Alert and  oriented x4;  grossly normal  neurologically. Skin:  Intact without significant lesions or rashes. Psych:  Alert and cooperative. Normal mood and affect.  Intake/Output from previous day: 11/07 0701 - 11/08 0700 In: 98.9 [I.V.:98.9] Out: 1600 [Urine:1600] Intake/Output this shift: Total I/O In: 240 [P.O.:240] Out: 700 [Urine:700]  Lab Results:  [facility] 10/23/12 0506 10/22/12 1920  WBC 9.1 10.1  HGB 13.2 14.5  HCT 38.3 40.7  PLT 287 344   BMET  Basename 10/23/12 0506 10/22/12 1920  NA 138 137  K 4.1 4.0  CL 105 101  CO2 25 22  GLUCOSE 91 83  BUN 20 20  CREATININE 0.73 0.72  CALCIUM 9.1 10.1   LFT  Basename 10/22/12 1920  PROT 7.9  ALBUMIN 4.4  AST 29  ALT 24  ALKPHOS 114  BILITOT 0.3  BILIDIR --  IBILI --   PT/INR  Basename 10/23/12 0506 10/22/12 1947  LABPROT 13.7 12.8  INR 1.06 0.97   Studies/Results: Ct Angio Chest Pe W/cm &/or Wo Cm  10/23/2012  *RADIOLOGY REPORT*  Clinical Data: Chest pain.  Shortness of breath.  Status post cardiac catheterization today.  CT ANGIOGRAPHY CHEST  Technique:  Multidetector CT imaging of the chest using the standard protocol during bolus administration of intravenous contrast. Multiplanar reconstructed images including MIPs were obtained and reviewed to evaluate the vascular anatomy.  Contrast: OMNIPAQUE IOHEXOL 350 MG/ML SOLN  Comparison: Chest radiograph performed 10/22/2012  Findings: There is no evidence of pulmonary embolus.  Mild right basilar atelectasis is noted.  The lungs are otherwise grossly clear.  There is no evidence of significant focal consolidation, pleural effusion or pneumothorax.  No masses are identified; no abnormal focal contrast enhancement is seen.  The mediastinum is unremarkable in appearance.  No mediastinal lymphadenopathy is seen.  No pericardial effusion is identified. The great vessels are unremarkable in appearance.  No axillary lymphadenopathy is seen.  The visualized portions of the thyroid gland are unremarkable in  appearance.  The visualized portions of the liver and spleen are unremarkable. The visualized portions of the pancreas, gallbladder, stomach, adrenal glands and kidneys are within normal limits.  No acute osseous abnormalities are seen.  IMPRESSION:  1.  No evidence of pulmonary embolus. 2.  Mild right basilar atelectasis; lungs otherwise clear.   Original Report Authenticated By: Tonia Ghent, M.D.    Dg Chest Portable 1 View  10/22/2012  *RADIOLOGY REPORT*  Clinical Data: Chest pain  PORTABLE CHEST - 1 VIEW  Comparison:  radiograph 07/02/2007  Findings: Normal mediastinum and cardiac silhouette.  Normal pulmonary  vasculature.  No evidence of effusion, infiltrate,  or pneumothorax.  No acute bony abnormality.  IMPRESSION: No acute cardiopulmonary process.   Original Report Authenticated By: Genevive Bi, M.D.     IMPRESSION:  -Noncardiac chest pain:  Differential includes esophageal spasm, esophagitis, acid reflux, pill-induced esophagitis (takes diclofenac and aleve), etc.  ? Possibly related to anxiety. ? Musculoskeletal (bowls on Tuesday nights).  PLAN: -Continue daily PPI for now. -Will discuss with Dr. Arlyce Dice regarding need for endoscopy.   ZEHR, JESSICA D.  10/24/2012, 2:32 PM  Pager number [phone]  Chart was reviewed and patient was examined. X-rays were reviewed.   Chest pain is atypical for GERD, PUD, or even esophageal spasm (doesn't radiate to left arm).  Doubt musculoskeletal pain.  Biliary colic may sometimes cause atypical chest pain.  Pt seems to respond to nitroglycerine, but not to a GI cocktail.  Recomment 1.  LFTs 2.  ultrasound  Barbette Hair. Arlyce Dice, M.D., [facility] Cell [phone]

## 2012-10-24 NOTE — Progress Notes (Signed)
Echocardiogram 2D Echocardiogram has been performed.  Rachel Sutton 10/24/2012, 9:21 AM

## 2012-10-25 ENCOUNTER — Observation Stay (HOSPITAL_COMMUNITY): Payer: Managed Care, Other (non HMO)

## 2012-10-25 LAB — HEPATIC FUNCTION PANEL
ALT: 19 U/L (ref 0–35)
AST: 21 U/L (ref 0–37)
Albumin: 3.3 g/dL — ABNORMAL LOW (ref 3.5–5.2)
Alkaline Phosphatase: 96 U/L (ref 39–117)
Bilirubin, Direct: <0.1 mg/dL (ref 0.0–0.3)
Total Bilirubin: 0.4 mg/dL (ref 0.3–1.2)
Total Protein: 6.4 g/dL (ref 6.0–8.3)

## 2012-10-25 MED ORDER — MORPHINE SULFATE 2 MG/ML IJ SOLN
2.0000 mg | INTRAMUSCULAR | Status: DC | PRN
  Administered 2012-10-25 – 2012-10-26 (×3): 2 mg via INTRAVENOUS
  Filled 2012-10-25 (×3): qty 1

## 2012-10-25 MED ORDER — SODIUM CHLORIDE 0.9 % IV SOLN
INTRAVENOUS | Status: DC
  Administered 2012-10-26: 500 mL via INTRAVENOUS

## 2012-10-25 NOTE — Progress Notes (Signed)
Subjective:  Still having some chest pains. GI has seen and plans endoscopy for tomorrow.  Objective:  Vital Signs in the last 24 hours: Temp:  [97.6 F (36.4 C)-98.1 F (36.7 C)] 97.7 F (36.5 C) (11/09 0435) Pulse Rate:  [65-80] 67  (11/09 0435) Resp:  [18-19] 18  (11/09 0435) BP: (94-123)/(57-68) 94/57 mmHg (11/09 0435) SpO2:  [95 %-98 %] 98 % (11/09 0435)  Intake/Output from previous day: 11/08 0701 - 11/09 0700 In: 240 [P.O.:240] Out: 700 [Urine:700] Intake/Output from this shift: Total I/O In: 240 [P.O.:240] Out: -      . aspirin EC  81 mg Oral Daily  . diclofenac  50 mg Oral BID  . [COMPLETED] gi cocktail  30 mL Oral Once  . [COMPLETED]  morphine injection  1 mg Intravenous Once  . multivitamin with minerals  1 tablet Oral q morning - 10a  . nebivolol  10 mg Oral q morning - 10a  . omega-3 acid ethyl esters  2 g Oral q morning - 10a  . pantoprazole  40 mg Oral QAC supper  . [COMPLETED] pneumococcal 23 valent vaccine  0.5 mL Intramuscular Tomorrow-1000  . simvastatin  40 mg Oral q1800  . sodium chloride  1 drop Both Eyes TID      . [DISCONTINUED] sodium chloride 150 mL/hr at 10/22/12 2319  . [DISCONTINUED] sodium chloride 1 mL/kg/hr (10/23/12 1845)    Physical Exam: The patient appears to be in no distress.  General: Well developed, well nourished, in no acute distress.  Head: Normal  Neck: Negative for carotid bruits. JVD not elevated.  Lungs: Clear bilaterally to auscultation without wheezes, rales, or rhonchi. Breathing is unlabored.  Heart: RRR S1 S2 without murmurs, rubs, or gallops. ? Soft midsystolic click when upright. Abdomen: Soft, non-tender, non-distended with normoactive bowel sounds. No hepatomegaly. No rebound/guarding. No obvious abdominal masses.  Extremities: No clubbing, cyanosis or edema. Distal pedal pulses are 2+ and equal bilaterally.  Neuro: Alert and oriented X 3. Moves all extremities spontaneously.  Psych: Responds to  questions appropriately with a normal affect.  Lab Results:  [facility] 10/23/12 0506 10/22/12 1920  WBC 9.1 10.1  HGB 13.2 14.5  PLT 287 344    Basename 10/23/12 0506 10/22/12 1920  NA 138 137  K 4.1 4.0  CL 105 101  CO2 25 22  GLUCOSE 91 83  BUN 20 20  CREATININE 0.73 0.72    Basename 10/23/12 1104 10/23/12 0506  TROPONINI <0.30 <0.30   Hepatic Function Panel  Basename 10/25/12 0520  PROT 6.4  ALBUMIN 3.3*  AST 21  ALT 19  ALKPHOS 96  BILITOT 0.4  BILIDIR <0.1  IBILI NOT CALCULATED    Basename 10/23/12 0506  CHOL 207*   No results found for this basename: PROTIME in the last 72 hours  Imaging: Imaging results have been reviewed  Cardiac Studies: 2D echo :  Josem Kaufmann ADMITTING [place], [place]aye SONOGRAPHER Nolon Rod, RDCS cc:  ------------------------------------------------------------ LV EF: 50% - 55%  ------------------------------------------------------------ Indications: Chest pain 786.51.  ------------------------------------------------------------ History: PMH: Mitral valve prolapse. Risk factors: Current tobacco use.  ------------------------------------------------------------ Study Conclusions  - Left ventricle: The cavity size was normal. Wall thickness was increased in a pattern of mild LVH. There was mild focal basal hypertrophy of the septum. Systolic function was normal. The estimated ejection fraction was in the range of 50% to 55%. Wall motion was normal; there were no regional wall motion abnormalities. Left ventricular  diastolic function parameters were normal. - Mitral valve: Prolapse. Prolapse, involving the posterior leaflet. Mild regurgitation.   Assessment/Plan:  Patient Active Hospital Problem List: Chest pain at rest (10/23/2012)   Assessment: Nonobstructive CAD by cath. Chest pain likely noncardiac   Plan:Endoscopy in am.  Probably home if endo negative. Mitral valve  prolapse, nonrheumatic (10/23/2012)   Assessment: Mild   Plan: Continue Bystolic.   LOS: 3 days    Cassell Clement 10/25/2012, 11:26 AM

## 2012-10-25 NOTE — Progress Notes (Signed)
[facility] Progress Note  SUBJECTIVE: Episode of chest pain during the night and another a few minutes ago.   OBJECTIVE:  Vital signs in last 24 hours: Temp:  [97.6 F (36.4 C)-98.1 F (36.7 C)] 97.7 F (36.5 C) (11/09 0435) Pulse Rate:  [65-80] 67  (11/09 0435) Resp:  [18-19] 18  (11/09 0435) BP: (94-123)/(57-68) 94/57 mmHg (11/09 0435) SpO2:  [95 %-98 %] 98 % (11/09 0435) Last BM Date: 10/23/12 General:    Pleasant white female in NAD Heart:  Regular rate and rhythm Lungs: Respirations even and unlabored, lungs CTA bilaterally Abdomen:  Soft, nontender and nondistended. Normal bowel sounds. Extremities:  Without edema. Neurologic:  Alert and oriented,  grossly normal neurologically. Psych:  Cooperative. Normal mood and affect.   Lab Results:  [facility] 10/23/12 0506 10/22/12 1920  WBC 9.1 10.1  HGB 13.2 14.5  HCT 38.3 40.7  PLT 287 344   BMET  Basename 10/23/12 0506 10/22/12 1920  NA 138 137  K 4.1 4.0  CL 105 101  CO2 25 22  GLUCOSE 91 83  BUN 20 20  CREATININE 0.73 0.72  CALCIUM 9.1 10.1   LFT  Basename 10/25/12 0520  PROT 6.4  ALBUMIN 3.3*  AST 21  ALT 19  ALKPHOS 96  BILITOT 0.4  BILIDIR <0.1  IBILI NOT CALCULATED   PT/INR  Basename 10/23/12 0506 10/22/12 1947  LABPROT 13.7 12.8  INR 1.06 0.97    Studies/Results: Ct Angio Chest Pe W/cm &/or Wo Cm  10/23/2012  *RADIOLOGY REPORT*  Clinical Data: Chest pain.  Shortness of breath.  Status post cardiac catheterization today.  CT ANGIOGRAPHY CHEST  Technique:  Multidetector CT imaging of the chest using the standard protocol during bolus administration of intravenous contrast. Multiplanar reconstructed images including MIPs were obtained and reviewed to evaluate the vascular anatomy.  Contrast: OMNIPAQUE IOHEXOL 350 MG/ML SOLN  Comparison: Chest radiograph performed 10/22/2012  Findings: There is no evidence of pulmonary embolus.  Mild right basilar atelectasis is noted.  The lungs  are otherwise grossly clear.  There is no evidence of significant focal consolidation, pleural effusion or pneumothorax.  No masses are identified; no abnormal focal contrast enhancement is seen.  The mediastinum is unremarkable in appearance.  No mediastinal lymphadenopathy is seen.  No pericardial effusion is identified. The great vessels are unremarkable in appearance.  No axillary lymphadenopathy is seen.  The visualized portions of the thyroid gland are unremarkable in appearance.  The visualized portions of the liver and spleen are unremarkable. The visualized portions of the pancreas, gallbladder, stomach, adrenal glands and kidneys are within normal limits.  No acute osseous abnormalities are seen.  IMPRESSION:  1.  No evidence of pulmonary embolus. 2.  Mild right basilar atelectasis; lungs otherwise clear.   Original Report Authenticated By: Tonia Ghent, M.D.    US Abdomen Complete  10/25/2012  *RADIOLOGY REPORT*  Clinical Data:  Atypical chest pain  COMPLETE ABDOMINAL ULTRASOUND  Comparison:  Report from CT abdomen of 11/18/2002 and report from abdomen ultrasound 11/10/2002  Findings:  Gallbladder:  No gallstones, gallbladder wall thickening, or pericholecystic fluid.  Common bile duct:  Measures 5 mm in diameter, within normal limits.  Liver:  No focal lesion identified.  Within normal limits in parenchymal echogenicity.  IVC:  Appears normal.  Pancreas:  The tip of the tail the pancreas is obscured by bowel gas.  Otherwise unremarkable.  Spleen:  Measures 8 cm craniocaudad and appears normal.  Right Kidney:  Measures 10.2  cm in length and appears normal.  Left Kidney:  Measures 10.5 cm in length and appears normal.  Abdominal aorta: No aneurysm or significant abnormality observed.  IMPRESSION: Negative abdominal ultrasound.   Original Report Authenticated By: Gaylyn Rong, M.D.      ASSESSMENT / PLAN:  Non cardiac chest pain. CTangio negative, LFTs and ultrasound nondiagnostic. Pain not  position, not pleuritic. No chest wall tenderness on exam. Still having intermittent pain. Will plan for EGD in the am for further evaluation of chest pain.    LOS: 3 days   Willette Cluster  10/25/2012, 9:01 AM   I have personally taken an interval history, reviewed the chart, and examined the patient.  I agree with the extender's note, impression and recommendations.  Barbette Hair. Arlyce Dice, MD, [facility] [phone]

## 2012-10-25 NOTE — Progress Notes (Signed)
[facility] Progress Note  Subjective: **Had another episode of chest pain this am.  LFTs, ultrasound are unremarkable*  Objective:  Vital signs in last 24 hours: Temp:  [97.6 F (36.4 C)-98.1 F (36.7 C)] 97.7 F (36.5 C) (11/09 0435) Pulse Rate:  [65-80] 67  (11/09 0435) Resp:  [18-19] 18  (11/09 0435) BP: (94-123)/(57-68) 94/57 mmHg (11/09 0435) SpO2:  [95 %-98 %] 98 % (11/09 0435) Last BM Date: 10/23/12 General:   Alert,  Well-developed,  white female in NAD Heart:  Regular rate and rhythm; no murmurs Abdomen:  Soft, nontender and nondistended. Normal bowel sounds, without guarding, and without rebound.   Extremities:  Without edema. Neurologic:  Alert and  oriented x4;  grossly normal neurologically. Psych:  Alert and cooperative. Normal mood and affect.  Intake/Output from previous day: 11/08 0701 - 11/09 0700 In: 240 [P.O.:240] Out: 700 [Urine:700] Intake/Output this shift: Total I/O In: 240 [P.O.:240] Out: -   Lab Results:  Basename 10/23/12 0506 10/22/12 1920  WBC 9.1 10.1  HGB 13.2 14.5  HCT 38.3 40.7  PLT 287 344   BMET  Basename 10/23/12 0506 10/22/12 1920  NA 138 137  K 4.1 4.0  CL 105 101  CO2 25 22  GLUCOSE 91 83  BUN 20 20  CREATININE 0.73 0.72  CALCIUM 9.1 10.1   LFT  Basename 10/25/12 0520  PROT 6.4  ALBUMIN 3.3*  AST 21  ALT 19  ALKPHOS 96  BILITOT 0.4  BILIDIR <0.1  IBILI NOT CALCULATED   PT/INR  Basename 10/23/12 0506 10/22/12 1947  LABPROT 13.7 12.8  INR 1.06 0.97   Hepatitis Panel No results found for this basename: HEPBSAG,HCVAB,HEPAIGM,HEPBIGM in the last 72 hours  Studies/Results: Ct Angio Chest Pe W/cm &/or Wo Cm  10/23/2012  *RADIOLOGY REPORT*  Clinical Data: Chest pain.  Shortness of breath.  Status post cardiac catheterization today.  CT ANGIOGRAPHY CHEST  Technique:  Multidetector CT imaging of the chest using the standard protocol during bolus administration of intravenous contrast. Multiplanar  reconstructed images including MIPs were obtained and reviewed to evaluate the vascular anatomy.  Contrast: 100mL OMNIPAQUE IOHEXOL 350 MG/ML SOLN  Comparison: Chest radiograph performed 10/22/2012  Findings: There is no evidence of pulmonary embolus.  Mild right basilar atelectasis is noted.  The lungs are otherwise grossly clear.  There is no evidence of significant focal consolidation, pleural effusion or pneumothorax.  No masses are identified; no abnormal focal contrast enhancement is seen.  The mediastinum is unremarkable in appearance.  No mediastinal lymphadenopathy is seen.  No pericardial effusion is identified. The great vessels are unremarkable in appearance.  No axillary lymphadenopathy is seen.  The visualized portions of the thyroid gland are unremarkable in appearance.  The visualized portions of the liver and spleen are unremarkable. The visualized portions of the pancreas, gallbladder, stomach, adrenal glands and kidneys are within normal limits.  No acute osseous abnormalities are seen.  IMPRESSION:  1.  No evidence of pulmonary embolus. 2.  Mild right basilar atelectasis; lungs otherwise clear.   Original Report Authenticated By: Jeffrey Chang, M.D.    Us Abdomen Complete  10/25/2012  *RADIOLOGY REPORT*  Clinical Data:  Atypical chest pain  COMPLETE ABDOMINAL ULTRASOUND  Comparison:  Report from CT abdomen of 11/18/2002 and report from abdomen ultrasound 11/10/2002  Findings:  Gallbladder:  No gallstones, gallbladder wall thickening, or pericholecystic fluid.  Common bile duct:  Measures 5 mm in diameter, within normal limits.  Liver:  No focal lesion identified.    Within normal limits in parenchymal echogenicity.  IVC:  Appears normal.  Pancreas:  The tip of the tail the pancreas is obscured by bowel gas.  Otherwise unremarkable.  Spleen:  Measures 8 cm craniocaudad and appears normal.  Right Kidney:  Measures 10.2 cm in length and appears normal.  Left Kidney:  Measures 10.5 cm in length and  appears normal.  Abdominal aorta: No aneurysm or significant abnormality observed.  IMPRESSION: Negative abdominal ultrasound.   Original Report Authenticated By: Walter Liebkemann, M.D.      Assessment / Plan: *Chest pain of uncertain etilogy.  THere is no evidence for biliary tract disease.  Esophageal spasm, although atypical, must be considered, as well as esophageal ulceration.  I still favor nonGI pain  Plan EGD on 10/26/12,  realizing that this is a relatively low yield study **  Active Problems:  Chest pain at rest  Mitral valve prolapse, nonrheumatic     LOS: 3 days   Rachel Sutton  10/25/2012, 11:12 AM   

## 2012-10-26 ENCOUNTER — Encounter (HOSPITAL_COMMUNITY): Payer: Self-pay | Admitting: *Deleted

## 2012-10-26 ENCOUNTER — Encounter (HOSPITAL_COMMUNITY)
Admission: EM | Disposition: A | Discharge status: Home / Self Care | Payer: Self-pay | Source: Home / Self Care | Attending: Emergency Medicine

## 2012-10-26 DIAGNOSIS — Z720 Tobacco use: Secondary | ICD-10-CM

## 2012-10-26 DIAGNOSIS — E785 Hyperlipidemia, unspecified: Secondary | ICD-10-CM

## 2012-10-26 DIAGNOSIS — R079 Chest pain, unspecified: Secondary | ICD-10-CM

## 2012-10-26 DIAGNOSIS — I10 Essential (primary) hypertension: Secondary | ICD-10-CM

## 2012-10-26 HISTORY — PX: ESOPHAGOGASTRODUODENOSCOPY: SHX5428

## 2012-10-26 SURGERY — EGD (ESOPHAGOGASTRODUODENOSCOPY)
Anesthesia: Moderate Sedation

## 2012-10-26 MED ORDER — NITROGLYCERIN 0.4 MG SL SUBL: 0.4000 mg | SUBLINGUAL_TABLET | SUBLINGUAL | Status: DC | PRN

## 2012-10-26 MED ORDER — PANTOPRAZOLE SODIUM 40 MG PO TBEC: 40.0000 mg | DELAYED_RELEASE_TABLET | Freq: Every day | ORAL | Status: DC

## 2012-10-26 MED ORDER — BUTAMBEN-TETRACAINE-BENZOCAINE 2-2-14 % EX AERO
INHALATION_SPRAY | CUTANEOUS | Status: DC | PRN
  Administered 2012-10-26: 2 via TOPICAL

## 2012-10-26 MED ORDER — FENTANYL CITRATE 0.05 MG/ML IJ SOLN
INTRAMUSCULAR | Status: DC | PRN
  Administered 2012-10-26 (×2): 25 ug via INTRAVENOUS

## 2012-10-26 MED ORDER — MIDAZOLAM HCL 5 MG/ML IJ SOLN
INTRAMUSCULAR | Status: AC
  Filled 2012-10-26: qty 4

## 2012-10-26 MED ORDER — ISOSORBIDE MONONITRATE ER 30 MG PO TB24: 30.0000 mg | ORAL_TABLET | Freq: Every day | ORAL | Status: DC

## 2012-10-26 MED ORDER — ASPIRIN 81 MG PO TBEC: 81.0000 mg | DELAYED_RELEASE_TABLET | Freq: Every day | ORAL | Status: DC

## 2012-10-26 MED ORDER — ISOSORBIDE MONONITRATE ER 30 MG PO TB24
30.0000 mg | ORAL_TABLET | Freq: Once | ORAL | Status: AC
  Administered 2012-10-26: 30 mg via ORAL
  Filled 2012-10-26: qty 1

## 2012-10-26 MED ORDER — NEBIVOLOL HCL 10 MG PO TABS: 10.0000 mg | ORAL_TABLET | Freq: Every morning | ORAL | Status: DC

## 2012-10-26 MED ORDER — NITROGLYCERIN 0.4 MG SL SUBL
SUBLINGUAL_TABLET | SUBLINGUAL | Status: AC
  Filled 2012-10-26: qty 25

## 2012-10-26 MED ORDER — MIDAZOLAM HCL 5 MG/5ML IJ SOLN
INTRAMUSCULAR | Status: DC | PRN
  Administered 2012-10-26 (×2): 2.5 mg via INTRAVENOUS

## 2012-10-26 MED ORDER — FENTANYL CITRATE 0.05 MG/ML IJ SOLN
INTRAMUSCULAR | Status: AC
  Filled 2012-10-26: qty 4

## 2012-10-26 NOTE — OR Nursing (Signed)
Patient c/o severe chest pain, requesting Nitrostat. Pain 7/10. Notified Dr. Arlyce Dice and he is en route. Gave patient one 0.4mg  Nitrostat  sublingually at 853. At 856, patient states pain is 2/10. At 858, patient states pain is completely gone.

## 2012-10-26 NOTE — Discharge Summary (Signed)
Discharge Summary   Patient ID: Rachel Sutton,  MRN: [id number], DOB/AGE: 1961/04/17 51 y.o.  Admit date: 10/22/2012 Discharge date: 10/26/2012  Primary Physician: Fredirick Maudlin, MD Primary Cardiologist: seen in consultation by Dr. Eden Emms; follow-up in [place] with Dr. Dietrich Pates  Discharge Diagnoses Active Problems:  Chest pain at rest  Mitral valve prolapse, nonrheumatic   Allergies Allergies  Allergen Reactions  . Bee Venom Anaphylaxis  . Haloperidol Lactate Other (See Comments)    Convulsions   . Meperidine Hcl Hives    Diagnostic Studies/Procedures  PORTABLE CHEST X-RAY - 10/22/12  PORTABLE CHEST - 1 VIEW  Comparison: radiograph 07/02/2007  Findings: Normal mediastinum and cardiac silhouette. Normal  pulmonary vasculature. No evidence of effusion, infiltrate, or  pneumothorax. No acute bony abnormality.  IMPRESSION:  No acute cardiopulmonary process.  CARDIAC CATHETERIZATION - 10/22/12  Hemodynamics:  AO 138/78 with a mean of 104 mmHg  LV 133/14 mmHg  Coronary angiography:  Coronary dominance: Left  Left mainstem: Normal  Left anterior descending (LAD): There is a short segment in the mid LAD with mild narrowing up to 20-30%. Otherwise the vessel appears normal. There is a large diagonal branch which is normal.  Left circumflex (LCx): Dominant vessel. Normal.  Right coronary artery (RCA): Small nondominant vessel. Normal.  Left ventriculography: Left ventricular systolic function is normal, LVEF is estimated at 55-65%, there is no significant mitral regurgitation, there is mild posterior prolapse.  Final Conclusions:  1. Minor nonobstructive coronary disease involving a short segment of the mid LAD.  2. Normal left ventricular function.  CT ANGIO CHEST - 10/23/12  IMPRESSION:  1. No evidence of pulmonary embolus.  2. Mild right basilar atelectasis; lungs otherwise clear.  2D ECHOCARDIOGRAM - 10/24/12  Study Conclusions  - Left ventricle: The  cavity size was normal. Wall thickness was increased in a pattern of mild LVH. There was mild focal basal hypertrophy of the septum. Systolic function was normal. The estimated ejection fraction was in the range of 50% to 55%. Wall motion was normal; there were no regional wall motion abnormalities. Left ventricular diastolic function parameters were normal. - Mitral valve: Prolapse. Prolapse, involving the posterior leaflet. Mild regurgitation.  ABDOMINAL ULTRASOUND - 10/25/12  IMPRESSION:  Negative abdominal ultrasound.  UPPER ENDOSCOPY - 10/26/12  Normal EGD  History of Present Illness  Rachel Sutton is a 51yo female who was transferred from [facility] to [facility] hospital on 10/22/12 w/ the above problem list.   She has a prior history of MVP diagnosed in 62 in [place]. Had not followed up with cardiologist x 10 years. Hx of breast CA (s/p R lumpectomy w/ sential node dissection + CXT/RXT). Multiple echos as a result of CXT/RXT, most recently 2009. She has a history of ongoing tobacco abuse (1 PPD), HTN, HL and family hx of CAD (father, three brothers).   She was driving home night before admission in her USOH when she suddenly experienced left-sided "grabbing" chest pain radiation to her left shoulder and arm w/ associated diaphoresis, weakness and SOB. She pulled over for 5 minutes, but the pain persisted. It waxed and waned on arrival to her home, and her husband transported her to the ED. There, initial trop returned WNL. EKG revealed anterior T wave changes w/ questionable septal infarct. She received NTG SL w/ some relief. She was admitted by medicine and cardiology was consulted the following day. Three sets of troponin returned WNL. Given significant cardiac RFs and HPI concerning for USAP, the decision was made to  transfer to [facility] for diagnostic cardiac catheterization. The risks, benefits and details of the procedure were outlined with the patient, and she agreed to proceed.    Hospital Course   She was consented and prepped for the procedure which is outlined above. This was notable for minor nonobstructive CAD and preserved LVEF. There was 20-30% mid LAD narrowing noted. The patient tolerated the procedure well without complications. The recommendation was made to pursue CT-angio as her D-dimer was elevated prior to transfer and obtain an echo.    The former study revealed no evidence of PE, the latter revealed LVEF 50-55%, mild LVH and confirmed MVP w/ mild MR. She continued to complain of intermittent chest discomfort relieved by NTG. EKGs performed at the time revealed no acute ischemia. GI consult was pursued to evaluate for possible esophageal/upper GI etiologies. Abdominal ultrasound was obtained and revealed no acute process. LFTs WNL. EGD was scheduled and performed the following day and was normal. She continued to experience intermittent chest discomfort relieved by NTG. DDx include esophageal spasm, and less likely MVP.   She was assessed by Dr. Eden Emms this morning, and felt to be stable for discharge, with further GI work-up as an outpatient. He noted MVP is likely not causing her discomfort. Esophageal manometry was suggested to evaluate for esophageal spasm, however Dr. Arlyce Dice noted her pain is likely not GI-related. She will be discharged with a long-acting nitrate for chest pain relief. She will follow-up in the [place] office in 1-2 weeks. She was advised to follow up with GI in 1-2 week for consideration of this procedure, and also with her PCP for alternative chest pain etiologies. Additionally, empiric treatment with CCB has been recommended in addition or as an alternative to isosorbide for relief. This information, including post-cath instructions and follow-up recs, have been clearly outlined in the discharge AVS.   Discharge Vitals:  Blood pressure 115/66, pulse 62, temperature 98.2 F (36.8 C), temperature source Oral, resp. rate 12, height 5'  4" (1.626 m), weight 78.019 kg (172 lb), SpO2 97.00%.   Labs:  Lab 10/25/12 0520 10/23/12 0506 10/22/12 1920  NA -- 138 137  K -- 4.1 4.0  CL -- 105 101  CO2 -- 25 22  BUN -- 20 20  CREATININE -- 0.73 0.72  CALCIUM -- 9.1 10.1  PROT 6.4 -- --  BILITOT 0.4 -- --  ALKPHOS 96 -- --  ALT 19 -- --  AST 21 -- --  AMYLASE -- -- --  LIPASE -- -- --  GLUCOSE -- 91 83   Disposition:  Discharge Orders    Future Appointments: Provider: Department: Dept Phone: Center:   05/29/2013 2:00 PM Radene Gunning [facility] [phone] None   05/29/2013 2:30 PM Pierce Crane, MD [facility] [phone] None     Follow-up Information    Follow up with [facility]. (In 1-2 weeks for follow-up after this hospitalization. Office will contact you with an appointment date & time. )    Contact information:   [address] [place] [address]       Schedule an appointment as soon as possible for a visit with [facility]. (Please call and make an appointment in 1-2 weeks for further work-up of chest discomfort. )    Contact information:   [address] [place] [address]          Discharge Medications:    Medication List  As of 10/26/2012 10:05 PM    START taking these medications         aspirin 81 MG EC tablet   Take 1 tablet (81 mg total) by mouth daily.      isosorbide mononitrate 30 MG 24 hr tablet   Commonly known as: IMDUR   Take 1 tablet (30 mg total) by mouth daily.      nitroGLYCERIN 0.4 MG SL tablet   Commonly known as: NITROSTAT   Place 1 tablet (0.4 mg total) under the tongue every 5 (five) minutes x 3 doses as needed for chest pain.      pantoprazole 40 MG tablet   Commonly known as: PROTONIX   Take 1 tablet (40 mg total) by mouth daily before supper.      CHANGE how you take these medications         nebivolol 10 MG tablet   Commonly known as: BYSTOLIC    Take 1 tablet (10 mg total) by mouth every morning.   What changed: - medication strength - dose      CONTINUE taking these medications         clonazePAM 1 MG tablet   Commonly known as: KLONOPIN      diclofenac 50 MG EC tablet   Commonly known as: VOLTAREN      EPINEPHrine 0.3 mg/0.3 mL Devi   Commonly known as: EPI-PEN      fish oil-omega-3 fatty acids 1000 MG capsule      multivitamin with minerals Tabs      simvastatin 40 MG tablet   Commonly known as: ZOCOR      sodium chloride 5 % ophthalmic solution   Commonly known as: MURO 128          Where to get your medications    These are the prescriptions that you need to pick up. We sent them to a specific pharmacy, so you will need to go there to get them.   [facility] - [place], [place] - [address]    [address] [place] [address]    Phone: [phone]        isosorbide mononitrate 30 MG 24 hr tablet   nebivolol 10 MG tablet   nitroGLYCERIN 0.4 MG SL tablet   pantoprazole 40 MG tablet         Information on where to get these meds is not yet available. Ask your nurse or doctor.         aspirin 81 MG EC tablet            Outstanding Labs/Studies: None  Duration of Discharge Encounter: Greater than 30 minutes including physician time.  Signed, R. Hurman Horn, PA-C 10/26/2012, 10:05 PM

## 2012-10-26 NOTE — Progress Notes (Signed)
Discussed discharge instructions with pt and new prescriptions. Pt verbalized understanding. Pt is stable for discharge with family member. Will provide a wheelchair for pt to be discharged to the car in.

## 2012-10-26 NOTE — Interval H&P Note (Signed)
History and Physical Interval Note:  10/26/2012 9:13 AM  Rachel Sutton  has presented today for surgery, with the diagnosis of chest pain  The various methods of treatment have been discussed with the patient and family. After consideration of risks, benefits and other options for treatment, the patient has consented to  Procedure(s) (LRB) with comments: ESOPHAGOGASTRODUODENOSCOPY (EGD) (N/A) as a surgical intervention .  The patient's history has been reviewed, patient examined, no change in status, stable for surgery.  I have reviewed the patient's chart and labs.  Questions were answered to the patient's satisfaction.    The recent H&P (dated *10/25/12**) was reviewed, the patient was examined and there is no change in the patients condition since that H&P was completed.   Melvia Heaps  10/26/2012, 9:13 AM    Melvia Heaps

## 2012-10-26 NOTE — Op Note (Signed)
[facility] [address] [place], [address]   ENDOSCOPY PROCEDURE REPORT  PATIENT: Rachel Sutton, Rachel Sutton  MR#: [id number] BIRTHDATE: 1961/12/13 , 51  yrs. old GENDER: Female ENDOSCOPIST: Louis Meckel, MD REFERRED BY:  Kari Baars, M.D. PROCEDURE DATE:  10/26/2012 PROCEDURE:  EGD, diagnostic ASA CLASS:     Class II INDICATIONS: MEDICATIONS: These medications were titrated to patient response per physician's verbal order, Versed 5 mg IV, and Fentanyl 50 mcg IV TOPICAL ANESTHETIC: Cetacaine Spray  DESCRIPTION OF PROCEDURE: After the risks benefits and alternatives of the procedure were thoroughly explained, informed consent was obtained.  The Pentax Gastroscope [id number] endoscope was introduced through the mouth and advanced to the third portion of the duodenum. Without limitations.  The instrument was slowly withdrawn as the mucosa was fully examined.      The upper, middle and distal third of the esophagus were carefully inspected and no abnormalities were noted.  The z-line was well seen at the GEJ.  The endoscope was pushed into the fundus which was normal including a retroflexed view.  The antrum, gastric body, first and second part of the duodenum were unremarkable. Retroflexed views revealed no abnormalities.     The scope was then withdrawn from the patient and the procedure completed.  COMPLICATIONS: There were no complications.  ENDOSCOPIC IMPRESSION: Normal EGD  RECOMMENDATIONS: t/c empiric Rx with calcium blocking agent for spasm  REPEAT EXAM:  eSigned:  Louis Meckel, MD 10/26/2012 9:33 AM   CC:

## 2012-10-26 NOTE — Progress Notes (Signed)
No abnormalities by EGD.  Note pt had pain prior to procedure that responded to s.l. NTG  While symptoms could be due to esophageal spasm, I am inclined to believe that pain is not from a GI origin.  Could it be related to her mitral valve prolapse?    If you think further GI studies are indicated at this time would proceed with esophageal manometry.  May consider empiric Rx with calcium channel blocker.  Call if further questions.

## 2012-10-26 NOTE — H&P (View-Only) (Signed)
[facility] Progress Note  Subjective: **Had another episode of chest pain this am.  LFTs, ultrasound are unremarkable*  Objective:  Vital signs in last 24 hours: Temp:  [97.6 F (36.4 C)-98.1 F (36.7 C)] 97.7 F (36.5 C) (11/09 0435) Pulse Rate:  [65-80] 67  (11/09 0435) Resp:  [18-19] 18  (11/09 0435) BP: (94-123)/(57-68) 94/57 mmHg (11/09 0435) SpO2:  [95 %-98 %] 98 % (11/09 0435) Last BM Date: 10/23/12 General:   Alert,  Well-developed,  white female in NAD Heart:  Regular rate and rhythm; no murmurs Abdomen:  Soft, nontender and nondistended. Normal bowel sounds, without guarding, and without rebound.   Extremities:  Without edema. Neurologic:  Alert and  oriented x4;  grossly normal neurologically. Psych:  Alert and cooperative. Normal mood and affect.  Intake/Output from previous day: 11/08 0701 - 11/09 0700 In: 240 [P.O.:240] Out: 700 [Urine:700] Intake/Output this shift: Total I/O In: 240 [P.O.:240] Out: -   Lab Results:  [facility] 10/23/12 0506 10/22/12 1920  WBC 9.1 10.1  HGB 13.2 14.5  HCT 38.3 40.7  PLT 287 344   BMET  Basename 10/23/12 0506 10/22/12 1920  NA 138 137  K 4.1 4.0  CL 105 101  CO2 25 22  GLUCOSE 91 83  BUN 20 20  CREATININE 0.73 0.72  CALCIUM 9.1 10.1   LFT  Basename 10/25/12 0520  PROT 6.4  ALBUMIN 3.3*  AST 21  ALT 19  ALKPHOS 96  BILITOT 0.4  BILIDIR <0.1  IBILI NOT CALCULATED   PT/INR  Basename 10/23/12 0506 10/22/12 1947  LABPROT 13.7 12.8  INR 1.06 0.97   Hepatitis Panel No results found for this basename: HEPBSAG,HCVAB,HEPAIGM,HEPBIGM in the last 72 hours  Studies/Results: Ct Angio Chest Pe W/cm &/or Wo Cm  10/23/2012  *RADIOLOGY REPORT*  Clinical Data: Chest pain.  Shortness of breath.  Status post cardiac catheterization today.  CT ANGIOGRAPHY CHEST  Technique:  Multidetector CT imaging of the chest using the standard protocol during bolus administration of intravenous contrast. Multiplanar  reconstructed images including MIPs were obtained and reviewed to evaluate the vascular anatomy.  Contrast: OMNIPAQUE IOHEXOL 350 MG/ML SOLN  Comparison: Chest radiograph performed 10/22/2012  Findings: There is no evidence of pulmonary embolus.  Mild right basilar atelectasis is noted.  The lungs are otherwise grossly clear.  There is no evidence of significant focal consolidation, pleural effusion or pneumothorax.  No masses are identified; no abnormal focal contrast enhancement is seen.  The mediastinum is unremarkable in appearance.  No mediastinal lymphadenopathy is seen.  No pericardial effusion is identified. The great vessels are unremarkable in appearance.  No axillary lymphadenopathy is seen.  The visualized portions of the thyroid gland are unremarkable in appearance.  The visualized portions of the liver and spleen are unremarkable. The visualized portions of the pancreas, gallbladder, stomach, adrenal glands and kidneys are within normal limits.  No acute osseous abnormalities are seen.  IMPRESSION:  1.  No evidence of pulmonary embolus. 2.  Mild right basilar atelectasis; lungs otherwise clear.   Original Report Authenticated By: Tonia Ghent, M.D.    US Abdomen Complete  10/25/2012  *RADIOLOGY REPORT*  Clinical Data:  Atypical chest pain  COMPLETE ABDOMINAL ULTRASOUND  Comparison:  Report from CT abdomen of 11/18/2002 and report from abdomen ultrasound 11/10/2002  Findings:  Gallbladder:  No gallstones, gallbladder wall thickening, or pericholecystic fluid.  Common bile duct:  Measures 5 mm in diameter, within normal limits.  Liver:  No focal lesion identified.  Within normal limits in parenchymal echogenicity.  IVC:  Appears normal.  Pancreas:  The tip of the tail the pancreas is obscured by bowel gas.  Otherwise unremarkable.  Spleen:  Measures 8 cm craniocaudad and appears normal.  Right Kidney:  Measures 10.2 cm in length and appears normal.  Left Kidney:  Measures 10.5 cm in length and  appears normal.  Abdominal aorta: No aneurysm or significant abnormality observed.  IMPRESSION: Negative abdominal ultrasound.   Original Report Authenticated By: Gaylyn Rong, M.D.      Assessment / Plan: *Chest pain of uncertain etilogy.  THere is no evidence for biliary tract disease.  Esophageal spasm, although atypical, must be considered, as well as esophageal ulceration.  I still favor nonGI pain  Plan EGD on 10/26/12,  realizing that this is a relatively low yield study **  Active Problems:  Chest pain at rest  Mitral valve prolapse, nonrheumatic     LOS: 3 days   Melvia Heaps  10/25/2012, 11:12 AM

## 2012-10-26 NOTE — Progress Notes (Signed)
Pt complaining of chest pain 4/10. Gave one SL nitro and pt stated her pain subsided. Obtained EKG and showed normal sinus rhythm. No acute changes from previous EKG. Will continue to monitor and notify PA. Pt is to be discharged today and wishes to speak with the MD or PA again. Notified MD of this and was told that PA would be by to speak with her.

## 2012-10-26 NOTE — Progress Notes (Signed)
Patient ID: Rachel Sutton, female   DOB: 06/03/1961, 51 y.o.   MRN: [id number]   Subjective:  No complaints throat still numb from endo  Objective:  Vital Signs in the last 24 hours: Temp:  [97.9 F (36.6 C)-98.2 F (36.8 C)] 98.2 F (36.8 C) (11/10 0833) Pulse Rate:  [60-62] 62  (11/10 0453) Resp:  [10-26] 12  (11/10 0955) BP: (97-137)/(54-75) 97/55 mmHg (11/10 0955) SpO2:  [91 %-99 %] 97 % (11/10 0955) Weight:  [172 lb (78.019 kg)] 172 lb (78.019 kg) (11/10 0453)  Intake/Output from previous day: 11/09 0701 - 11/10 0700 In: 720 [P.O.:720] Out: 575 [Urine:575] Intake/Output from this shift:       . aspirin EC  81 mg Oral Daily  . diclofenac  50 mg Oral BID  . multivitamin with minerals  1 tablet Oral q morning - 10a  . nebivolol  10 mg Oral q morning - 10a  . omega-3 acid ethyl esters  2 g Oral q morning - 10a  . pantoprazole  40 mg Oral QAC supper  . simvastatin  40 mg Oral q1800  . sodium chloride  1 drop Both Eyes TID      . [DISCONTINUED] sodium chloride 500 mL (10/26/12 0839)    Physical Exam: The patient appears to be in no distress.  General: Well developed, well nourished, in no acute distress.  Head: Normal  Neck: Negative for carotid bruits. JVD not elevated.  Lungs: Clear bilaterally to auscultation without wheezes, rales, or rhonchi. Breathing is unlabored.  Heart: RRR S1 S2 without murmurs, rubs, or gallops. ? Soft midsystolic click when upright. Abdomen: Soft, non-tender, non-distended with normoactive bowel sounds. No hepatomegaly. No rebound/guarding. No obvious abdominal masses.  Extremities: No clubbing, cyanosis or edema. Distal pedal pulses are 2+ and equal bilaterally.  Neuro: Alert and oriented X 3. Moves all extremities spontaneously.  Psych: Responds to questions appropriately with a normal affect.  Lab Results: No results found for this basename: WBC:2,HGB:2,PLT:2 in the last 72 hours No results found for this basename:  NA:2,K:2,CL:2,CO2:2,GLUCOSE:2,BUN:2,CREATININE:2 in the last 72 hours  Basename 10/23/12 1104  TROPONINI <0.30   Hepatic Function Panel  Basename 10/25/12 0520  PROT 6.4  ALBUMIN 3.3*  AST 21  ALT 19  ALKPHOS 96  BILITOT 0.4  BILIDIR <0.1  IBILI NOT CALCULATED   No results found for this basename: CHOL in the last 72 hours No results found for this basename: PROTIME in the last 72 hours  Imaging: Imaging results have been reviewed  Cardiac Studies: 2D echo :  Josem Kaufmann ADMITTING [place], Tesfaye SONOGRAPHER Nolon Rod, RDCS cc:  ------------------------------------------------------------ LV EF: 50% - 55%  ------------------------------------------------------------ Indications: Chest pain 786.51.  ------------------------------------------------------------ History: PMH: Mitral valve prolapse. Risk factors: Current tobacco use.  ------------------------------------------------------------ Study Conclusions  - Left ventricle: The cavity size was normal. Wall thickness was increased in a pattern of mild LVH. There was mild focal basal hypertrophy of the septum. Systolic function was normal. The estimated ejection fraction was in the range of 50% to 55%. Wall motion was normal; there were no regional wall motion abnormalities. Left ventricular diastolic function parameters were normal. - Mitral valve: Prolapse. Prolapse, involving the posterior leaflet. Mild regurgitation.   Assessment/Plan:  Chest Pain: Atypical no epicardial CAD on cath CT negative. Endo negative.  Doubt Syndrome X or microvascular disease This is not related to MVP.  GI suggests possible manometry.  Prefer to D/C with PO nitrates and  F/U with Dr Dietrich Pates in  [place].  If he wants she can f/u with Rehman     LOS: 4 days    Charlton Haws 10/26/2012, 10:37 AM

## 2012-10-27 ENCOUNTER — Telehealth: Payer: Self-pay | Admitting: Adult Health

## 2012-10-27 ENCOUNTER — Encounter (HOSPITAL_COMMUNITY): Payer: Self-pay | Admitting: Gastroenterology

## 2012-10-27 NOTE — Telephone Encounter (Signed)
Patient states that she was seen by Rachel Sutton in the hospital.  She was prescribed Imdur which is giving her a headache.  WAnts to know what she should do about this. Patient is scheduled to see Rachel Sutton on 11/03/12.  Has not been seen in office before, only in hospital. / tg

## 2012-10-28 NOTE — Telephone Encounter (Signed)
Please advise 

## 2012-10-28 NOTE — Telephone Encounter (Signed)
She is to take Extra strength tylenol every 6 hours around the clock for a couple of days then prn. She will become more tolerant of the isosorbide over the next couple of weeks.

## 2012-10-28 NOTE — Telephone Encounter (Signed)
Patient made aware of recommendations and verbalizes understanding. 

## 2012-10-30 ENCOUNTER — Telehealth: Payer: Self-pay | Admitting: Gastroenterology

## 2012-10-30 NOTE — Telephone Encounter (Signed)
Pt was seen by Dr. Arlyce Dice when in the hospital. Per her D/C instructions pt was to F/U with Dr. Arlyce Dice in 1-2 weeks. Pt scheduled to see Dr. Arlyce Dice 11/11/12@9 :30am. Pt aware of appt date and time.

## 2012-11-03 ENCOUNTER — Ambulatory Visit (INDEPENDENT_AMBULATORY_CARE_PROVIDER_SITE_OTHER): Payer: Managed Care, Other (non HMO) | Admitting: Adult Health

## 2012-11-03 ENCOUNTER — Encounter: Payer: Self-pay | Admitting: Adult Health

## 2012-11-03 VITALS — BP 114/60 | HR 80 | Ht 65.0 in | Wt 172.0 lb

## 2012-11-03 DIAGNOSIS — F17200 Nicotine dependence, unspecified, uncomplicated: Secondary | ICD-10-CM

## 2012-11-03 DIAGNOSIS — E785 Hyperlipidemia, unspecified: Secondary | ICD-10-CM

## 2012-11-03 DIAGNOSIS — I341 Nonrheumatic mitral (valve) prolapse: Secondary | ICD-10-CM

## 2012-11-03 DIAGNOSIS — R079 Chest pain, unspecified: Secondary | ICD-10-CM

## 2012-11-03 DIAGNOSIS — I059 Rheumatic mitral valve disease, unspecified: Secondary | ICD-10-CM

## 2012-11-03 DIAGNOSIS — Z720 Tobacco use: Secondary | ICD-10-CM

## 2012-11-03 NOTE — Patient Instructions (Addendum)
Your physician recommends that you schedule a follow-up appointment in: 3 MONTHS WITH KT  Please follow up with PCP Kari Baars concerning Talbert Nan ix per noted contraindication per current medication Klonopin   Your physician has recommended you make the following change in your medication:   1) DECREASE IMDUR 30mg  to 15mg  tablet, half the medication daily to decrease headaches, please note if any chest discomfort noted after medication decrease, please return to previous dosage of 30mg    Please give your test results to your Oncologist

## 2012-11-03 NOTE — Progress Notes (Deleted)
Name: Rachel Sutton    DOB: [birth year]  Age: 51 y.o.  MR#: [id number]       PCP:  Fredirick Maudlin, MD      Insurance: @PAYORNAME @   CC:   No chief complaint on file.  MEDICATION LIST STILL EXPERIENCING HEADACHE WITH IMDUR, DESPITE TYLENOL USE, HOWEVER IS BETTER INTERESTED IN STOPPING SMOKING  VS BP 114/60  Pulse 80  Ht 5\' 5"  (1.651 m)  Wt 172 lb (78.019 kg)  BMI 28.62 kg/m2  SpO2 96%  Weights Current Weight  11/03/12 172 lb (78.019 kg)  10/26/12 172 lb (78.019 kg)  10/26/12 172 lb (78.019 kg)    Blood Pressure  BP Readings from Last 3 Encounters:  11/03/12 114/60  10/26/12 115/66  10/26/12 115/66     Admit date:  (Not on file) Last encounter with RMR:  10/27/2012   Allergy Allergies  Allergen Reactions  . Bee Venom Anaphylaxis  . Haloperidol Lactate Other (See Comments)    Convulsions   . Meperidine Hcl Hives    Current Outpatient Prescriptions  Medication Sig Dispense Refill  . aspirin EC 81 MG EC tablet Take 1 tablet (81 mg total) by mouth daily.      . clonazePAM (KLONOPIN) 1 MG tablet 1 mg 3 (three) times daily as needed. For anxiety      . diclofenac (VOLTAREN) 50 MG EC tablet Take 50 mg by mouth 2 (two) times daily.      Marland Kitchen EPINEPHrine (EPI-PEN) 0.3 mg/0.3 mL DEVI Inject 0.3 mg into the muscle as needed. For bee venom      . fish oil-omega-3 fatty acids 1000 MG capsule Take 2 g by mouth every morning.      . isosorbide mononitrate (IMDUR) 30 MG 24 hr tablet Take 1 tablet (30 mg total) by mouth daily.  30 tablet  3  . Multiple Vitamin (MULITIVITAMIN WITH MINERALS) TABS Take 1 tablet by mouth every morning.      . nebivolol (BYSTOLIC) 10 MG tablet Take 1 tablet (10 mg total) by mouth every morning.  30 tablet  3  . nitroGLYCERIN (NITROSTAT) 0.4 MG SL tablet Place 1 tablet (0.4 mg total) under the tongue every 5 (five) minutes x 3 doses as needed for chest pain.  25 tablet  3  . pantoprazole (PROTONIX) 40 MG tablet Take 1 tablet (40 mg total) by mouth daily  before supper.  30 tablet  3  . simvastatin (ZOCOR) 40 MG tablet Take 40 mg by mouth every morning.       . sodium chloride (MURO 128) 5 % ophthalmic solution Place 1 drop into both eyes 3 (three) times daily.        Discontinued Meds:    Medications Discontinued During This Encounter  Medication Reason  . diclofenac (CATAFLAM) 50 MG tablet Duplicate    Patient Active Problem List  Diagnosis  . KNEE, ARTHRITIS, DEGEN./OSTEO  . DERANGEMENT MENISCUS  . JOINT EFFUSION, LEFT KNEE  . KNEE PAIN  . LATERAL EPICONDYLITIS  . OA (osteoarthritis) of knee  . Medial meniscus, posterior horn derangement  . Breast cancer  . S/P left knee arthroscopy  . Knee pain  . Knee stiffness  . Difficulty in walking  . Chest pain at rest  . Mitral valve prolapse, nonrheumatic  . Hyperlipidemia  . HTN (hypertension)  . Tobacco abuse    LABS Admission on 10/22/2012, Discharged on 10/26/2012  Component Date Value  . Troponin I 10/22/2012 <0.30   . WBC 10/22/2012 10.1   .  RBC 10/22/2012 4.73   . Hemoglobin 10/22/2012 14.5   . HCT 10/22/2012 40.7   . MCV 10/22/2012 86.0   . [facility] 10/22/2012 30.7   . MCHC 10/22/2012 35.6   . RDW 10/22/2012 12.9   . Platelets 10/22/2012 344   . Neutrophils Relative 10/22/2012 39*  . Neutro Abs 10/22/2012 4.0   . Lymphocytes Relative 10/22/2012 49*  . Lymphs Abs 10/22/2012 4.9*  . Monocytes Relative 10/22/2012 7   . Monocytes Absolute 10/22/2012 0.7   . Eosinophils Relative 10/22/2012 4   . Eosinophils Absolute 10/22/2012 0.4   . Basophils Relative 10/22/2012 1   . Basophils Absolute 10/22/2012 0.1   . Sodium 10/22/2012 137   . Potassium 10/22/2012 4.0   . Chloride 10/22/2012 101   . CO2 10/22/2012 22   . Glucose, Bld 10/22/2012 83   . BUN 10/22/2012 20   . Creatinine, Ser 10/22/2012 0.72   . Calcium 10/22/2012 10.1   . Total Protein 10/22/2012 7.9   . Albumin 10/22/2012 4.4   . AST 10/22/2012 29   . ALT 10/22/2012 24   . Alkaline Phosphatase  10/22/2012 114   . Total Bilirubin 10/22/2012 0.3   . GFR calc non Af Amer 10/22/2012 >90   . GFR calc Af Amer 10/22/2012 >90   . Prothrombin Time 10/22/2012 12.8   . INR 10/22/2012 0.97   . aPTT 10/22/2012 33   . Color, Urine 10/22/2012 YELLOW   . APPearance 10/22/2012 CLEAR   . Specific Gravity, Urine 10/22/2012 1.025   . pH 10/22/2012 6.0   . Glucose, UA 10/22/2012 NEGATIVE   . Hgb urine dipstick 10/22/2012 NEGATIVE   . Bilirubin Urine 10/22/2012 NEGATIVE   . Ketones, ur 10/22/2012 NEGATIVE   . Protein, ur 10/22/2012 NEGATIVE   . Urobilinogen, UA 10/22/2012 0.2   . Nitrite 10/22/2012 NEGATIVE   . Leukocytes, UA 10/22/2012 NEGATIVE   . Troponin i, poc 10/22/2012 0.01   . Comment 3 10/22/2012          . Troponin I 10/23/2012 <0.30   . Troponin I 10/23/2012 <0.30   . Cholesterol 10/23/2012 207*  . Triglycerides 10/23/2012 319*  . HDL 10/23/2012 33*  . Total CHOL/HDL Ratio 10/23/2012 6.3   . VLDL 10/23/2012 64*  . LDL Cholesterol 10/23/2012 110*  . WBC 10/23/2012 9.1   . RBC 10/23/2012 4.38   . Hemoglobin 10/23/2012 13.2   . HCT 10/23/2012 38.3   . MCV 10/23/2012 87.4   . [facility] 10/23/2012 30.1   . MCHC 10/23/2012 34.5   . RDW 10/23/2012 13.0   . Platelets 10/23/2012 287   . Sodium 10/23/2012 138   . Potassium 10/23/2012 4.1   . Chloride 10/23/2012 105   . CO2 10/23/2012 25   . Glucose, Bld 10/23/2012 91   . BUN 10/23/2012 20   . Creatinine, Ser 10/23/2012 0.73   . Calcium 10/23/2012 9.1   . GFR calc non Af Amer 10/23/2012 >90   . GFR calc Af Amer 10/23/2012 >90   . Prothrombin Time 10/23/2012 13.7   . INR 10/23/2012 1.06   . Troponin i, poc 10/22/2012 0.01   . Comment 3 10/22/2012          . D-Dimer, Quant 10/23/2012 1.57*  . Total Protein 10/25/2012 6.4   . Albumin 10/25/2012 3.3*  . AST 10/25/2012 21   . ALT 10/25/2012 19   . Alkaline Phosphatase 10/25/2012 96   . Total Bilirubin 10/25/2012 0.4   . Bilirubin, Direct 10/25/2012 <0.1   .  Indirect Bilirubin  10/25/2012 NOT CALCULATED      Results for this Opt Visit:     Results for orders placed during the hospital encounter of 10/22/12  TROPONIN I      Component Value Range   Troponin I <0.30  <0.30 ng/mL  CBC WITH DIFFERENTIAL      Component Value Range   WBC 10.1  4.0 - 10.5 K/uL   RBC 4.73  3.87 - 5.11 MIL/uL   Hemoglobin 14.5  12.0 - 15.0 g/dL   HCT 45.4  09.8 - 11.9 %   MCV 86.0  78.0 - 100.0 fL   MCH 30.7  26.0 - 34.0 pg   MCHC 35.6  30.0 - 36.0 g/dL   RDW 14.7  82.9 - 56.2 %   Platelets 344  150 - 400 K/uL   Neutrophils Relative 39 (*) 43 - 77 %   Neutro Abs 4.0  1.7 - 7.7 K/uL   Lymphocytes Relative 49 (*) 12 - 46 %   Lymphs Abs 4.9 (*) 0.7 - 4.0 K/uL   Monocytes Relative 7  3 - 12 %   Monocytes Absolute 0.7  0.1 - 1.0 K/uL   Eosinophils Relative 4  0 - 5 %   Eosinophils Absolute 0.4  0.0 - 0.7 K/uL   Basophils Relative 1  0 - 1 %   Basophils Absolute 0.1  0.0 - 0.1 K/uL  COMPREHENSIVE METABOLIC PANEL      Component Value Range   Sodium 137  135 - 145 mEq/L   Potassium 4.0  3.5 - 5.1 mEq/L   Chloride 101  96 - 112 mEq/L   CO2 22  19 - 32 mEq/L   Glucose, Bld 83  70 - 99 mg/dL   BUN 20  6 - 23 mg/dL   Creatinine, Ser 1.30  0.50 - 1.10 mg/dL   Calcium 86.5  8.4 - 78.4 mg/dL   Total Protein 7.9  6.0 - 8.3 g/dL   Albumin 4.4  3.5 - 5.2 g/dL   AST 29  0 - 37 U/L   ALT 24  0 - 35 U/L   Alkaline Phosphatase 114  39 - 117 U/L   Total Bilirubin 0.3  0.3 - 1.2 mg/dL   GFR calc non Af Amer >90  >90 mL/min   GFR calc Af Amer >90  >90 mL/min  PROTIME-INR      Component Value Range   Prothrombin Time 12.8  11.6 - 15.2 seconds   INR 0.97  0.00 - 1.49  APTT      Component Value Range   aPTT 33  24 - 37 seconds  URINALYSIS, ROUTINE W REFLEX MICROSCOPIC      Component Value Range   Color, Urine YELLOW  YELLOW   APPearance CLEAR  CLEAR   Specific Gravity, Urine 1.025  1.005 - 1.030   pH 6.0  5.0 - 8.0   Glucose, UA NEGATIVE  NEGATIVE mg/dL   Hgb urine dipstick NEGATIVE   NEGATIVE   Bilirubin Urine NEGATIVE  NEGATIVE   Ketones, ur NEGATIVE  NEGATIVE mg/dL   Protein, ur NEGATIVE  NEGATIVE mg/dL   Urobilinogen, UA 0.2  0.0 - 1.0 mg/dL   Nitrite NEGATIVE  NEGATIVE   Leukocytes, UA NEGATIVE  NEGATIVE  POCT I-STAT TROPONIN I      Component Value Range   Troponin i, poc 0.01  0.00 - 0.08 ng/mL   Comment 3           TROPONIN  I      Component Value Range   Troponin I <0.30  <0.30 ng/mL  TROPONIN I      Component Value Range   Troponin I <0.30  <0.30 ng/mL  LIPID PANEL      Component Value Range   Cholesterol 207 (*) 0 - 200 mg/dL   Triglycerides 409 (*) <150 mg/dL   HDL 33 (*) >81 mg/dL   Total CHOL/HDL Ratio 6.3     VLDL 64 (*) 0 - 40 mg/dL   LDL Cholesterol 191 (*) 0 - 99 mg/dL  CBC      Component Value Range   WBC 9.1  4.0 - 10.5 K/uL   RBC 4.38  3.87 - 5.11 MIL/uL   Hemoglobin 13.2  12.0 - 15.0 g/dL   HCT 47.8  29.5 - 62.1 %   MCV 87.4  78.0 - 100.0 fL   MCH 30.1  26.0 - 34.0 pg   MCHC 34.5  30.0 - 36.0 g/dL   RDW 30.8  65.7 - 84.6 %   Platelets 287  150 - 400 K/uL  BASIC METABOLIC PANEL      Component Value Range   Sodium 138  135 - 145 mEq/L   Potassium 4.1  3.5 - 5.1 mEq/L   Chloride 105  96 - 112 mEq/L   CO2 25  19 - 32 mEq/L   Glucose, Bld 91  70 - 99 mg/dL   BUN 20  6 - 23 mg/dL   Creatinine, Ser 9.62  0.50 - 1.10 mg/dL   Calcium 9.1  8.4 - 95.2 mg/dL   GFR calc non Af Amer >90  >90 mL/min   GFR calc Af Amer >90  >90 mL/min  PROTIME-INR      Component Value Range   Prothrombin Time 13.7  11.6 - 15.2 seconds   INR 1.06  0.00 - 1.49  POCT I-STAT TROPONIN I      Component Value Range   Troponin i, poc 0.01  0.00 - 0.08 ng/mL   Comment 3           D-DIMER, QUANTITATIVE      Component Value Range   D-Dimer, Quant 1.57 (*) 0.00 - 0.48 ug/mL-FEU  HEPATIC FUNCTION PANEL      Component Value Range   Total Protein 6.4  6.0 - 8.3 g/dL   Albumin 3.3 (*) 3.5 - 5.2 g/dL   AST 21  0 - 37 U/L   ALT 19  0 - 35 U/L   Alkaline  Phosphatase 96  39 - 117 U/L   Total Bilirubin 0.4  0.3 - 1.2 mg/dL   Bilirubin, Direct <8.4  0.0 - 0.3 mg/dL   Indirect Bilirubin NOT CALCULATED  0.3 - 0.9 mg/dL    EKG Orders placed during the hospital encounter of 10/22/12  . EKG 12-LEAD  . EKG 12-LEAD  . EKG 12-LEAD  . ED EKG  . EKG 12-LEAD  . ED EKG  . ED EKG  . ED EKG  . EKG  . EKG 12-LEAD  . EKG 12-LEAD  . EKG 12-LEAD  . ED EKG  . ED EKG  . ED EKG  . EKG 12-LEAD  . EKG 12-LEAD  . ED EKG  . ED EKG  . ED EKG  . ED EKG  . ED EKG  . ED EKG  . EKG 12-LEAD  . EKG 12-LEAD     Prior Assessment and Plan Problem List as of 11/03/2012  Cardiology Problems   Mitral valve prolapse, nonrheumatic   Hyperlipidemia   HTN (hypertension)     Other   KNEE, ARTHRITIS, DEGEN./OSTEO   DERANGEMENT MENISCUS   JOINT EFFUSION, LEFT KNEE   KNEE PAIN   LATERAL EPICONDYLITIS   OA (osteoarthritis) of knee   Medial meniscus, posterior horn derangement   Breast cancer   S/P left knee arthroscopy   Knee pain   Knee stiffness   Difficulty in walking   Chest pain at rest   Tobacco abuse       Imaging: Ct Angio Chest Pe W/cm &/or Wo Cm  10/23/2012  *RADIOLOGY REPORT*  Clinical Data: Chest pain.  Shortness of breath.  Status post cardiac catheterization today.  CT ANGIOGRAPHY CHEST  Technique:  Multidetector CT imaging of the chest using the standard protocol during bolus administration of intravenous contrast. Multiplanar reconstructed images including MIPs were obtained and reviewed to evaluate the vascular anatomy.  Contrast: OMNIPAQUE IOHEXOL 350 MG/ML SOLN  Comparison: Chest radiograph performed 10/22/2012  Findings: There is no evidence of pulmonary embolus.  Mild right basilar atelectasis is noted.  The lungs are otherwise grossly clear.  There is no evidence of significant focal consolidation, pleural effusion or pneumothorax.  No masses are identified; no abnormal focal contrast enhancement is seen.  The  mediastinum is unremarkable in appearance.  No mediastinal lymphadenopathy is seen.  No pericardial effusion is identified. The great vessels are unremarkable in appearance.  No axillary lymphadenopathy is seen.  The visualized portions of the thyroid gland are unremarkable in appearance.  The visualized portions of the liver and spleen are unremarkable. The visualized portions of the pancreas, gallbladder, stomach, adrenal glands and kidneys are within normal limits.  No acute osseous abnormalities are seen.  IMPRESSION:  1.  No evidence of pulmonary embolus. 2.  Mild right basilar atelectasis; lungs otherwise clear.   Original Report Authenticated By: Tonia Ghent, M.D.    US Abdomen Complete  10/25/2012  *RADIOLOGY REPORT*  Clinical Data:  Atypical chest pain  COMPLETE ABDOMINAL ULTRASOUND  Comparison:  Report from CT abdomen of 11/18/2002 and report from abdomen ultrasound 11/10/2002  Findings:  Gallbladder:  No gallstones, gallbladder wall thickening, or pericholecystic fluid.  Common bile duct:  Measures 5 mm in diameter, within normal limits.  Liver:  No focal lesion identified.  Within normal limits in parenchymal echogenicity.  IVC:  Appears normal.  Pancreas:  The tip of the tail the pancreas is obscured by bowel gas.  Otherwise unremarkable.  Spleen:  Measures 8 cm craniocaudad and appears normal.  Right Kidney:  Measures 10.2 cm in length and appears normal.  Left Kidney:  Measures 10.5 cm in length and appears normal.  Abdominal aorta: No aneurysm or significant abnormality observed.  IMPRESSION: Negative abdominal ultrasound.   Original Report Authenticated By: Gaylyn Rong, M.D.    Dg Chest Portable 1 View  10/22/2012  *RADIOLOGY REPORT*  Clinical Data: Chest pain  PORTABLE CHEST - 1 VIEW  Comparison:  radiograph 07/02/2007  Findings: Normal mediastinum and cardiac silhouette.  Normal pulmonary  vasculature.  No evidence of effusion, infiltrate, or pneumothorax.  No acute bony abnormality.   IMPRESSION: No acute cardiopulmonary process.   Original Report Authenticated By: Genevive Bi, M.D.      [facility] Calculation: Score not calculated. Missing: Total Cholesterol

## 2012-11-03 NOTE — Progress Notes (Signed)
HPI: Rachel Sutton is a 51 y/o patient of Dr. [name] we are seeing after hospitalization for chest pain, with subsequent cardiac cath. This was completed by Dr. Patty Sermons revealing minor, nonobstructive CAD of the LAD with normal LV fx, She also had an EGD completed revealing GERD. She is now on a PPI, and Imdur 30 mg daily. She was increased on her dose of Bystolic to 10 mg daily. She is doing well with the exception of recurrent headache on nitrates. Tylenol helps but does not completely eliminate the headache. She says they are getting better though.   Allergies  Allergen Reactions  . Bee Venom Anaphylaxis  . Haloperidol Lactate Other (See Comments)    Convulsions   . Meperidine Hcl Hives    Current Outpatient Prescriptions  Medication Sig Dispense Refill  . aspirin EC 81 MG EC tablet Take 1 tablet (81 mg total) by mouth daily.      . clonazePAM (KLONOPIN) 1 MG tablet 1 mg 3 (three) times daily as needed. For anxiety      . diclofenac (VOLTAREN) 50 MG EC tablet Take 50 mg by mouth 2 (two) times daily.      Marland Kitchen EPINEPHrine (EPI-PEN) 0.3 mg/0.3 mL DEVI Inject 0.3 mg into the muscle as needed. For bee venom      . fish oil-omega-3 fatty acids 1000 MG capsule Take 2 g by mouth every morning.      . isosorbide mononitrate (IMDUR) 30 MG 24 hr tablet Take 1 tablet (30 mg total) by mouth daily.  30 tablet  3  . Multiple Vitamin (MULITIVITAMIN WITH MINERALS) TABS Take 1 tablet by mouth every morning.      . nebivolol (BYSTOLIC) 10 MG tablet Take 1 tablet (10 mg total) by mouth every morning.  30 tablet  3  . nitroGLYCERIN (NITROSTAT) 0.4 MG SL tablet Place 1 tablet (0.4 mg total) under the tongue every 5 (five) minutes x 3 doses as needed for chest pain.  25 tablet  3  . pantoprazole (PROTONIX) 40 MG tablet Take 1 tablet (40 mg total) by mouth daily before supper.  30 tablet  3  . simvastatin (ZOCOR) 40 MG tablet Take 40 mg by mouth every morning.       . sodium chloride (MURO 128) 5 % ophthalmic  solution Place 1 drop into both eyes 3 (three) times daily.        Past Medical History  Diagnosis Date  . Fuchs' corneal dystrophy   . HTN (hypertension)   . High cholesterol   . Hx of mitral valve prolapse 1980  . GERD (gastroesophageal reflux disease)   . Arthritis   . Anxiety   . Breast cancer 02/22/2012    right T1c, N0    Past Surgical History  Procedure Date  . Carpal tunnel r/l 1992  . Ray  cage fusion 1997  . A cervical spine fusion x2 since last visit   . Elbow surgery  tennis elbow release 2012  . Abdominal hysterectomy 1999  . Sentinel lymph node biopsy 10/30/2002    right  . Ovarian cyst removal     prior to hysterectomy, right  . Breast lumpectomy 10/30/2002    right  . Esophagogastroduodenoscopy 10/26/2012    Procedure: ESOPHAGOGASTRODUODENOSCOPY (EGD);  Surgeon: Louis Meckel, MD;  Location: [facility] ENDOSCOPY;  Service: Endoscopy;  Laterality: N/A;    JYN:WGNFAO of systems complete and found to be negative unless listed above  PHYSICAL EXAM BP 114/60  Pulse 80  Ht 5\' 5"  (1.651 m)  Wt 172 lb (78.019 kg)  BMI 28.62 kg/m2  SpO2 96%  General: Well developed, well nourished, in no acute distress Head: Eyes PERRLA, No xanthomas.   Normal cephalic and atramatic  Lungs: Clear bilaterally to auscultation and percussion. Heart: HRRR S1 S2, without MRG.  Pulses are 2+ & equal.            No carotid bruit. No JVD.  No abdominal bruits. No femoral bruits. Abdomen: Bowel sounds are positive, abdomen soft and non-tender without masses or                  Hernia's noted. Msk:  Back normal, normal gait. Normal strength and tone for age. Extremities: No clubbing, cyanosis or edema.  DP +1 Neuro: Alert and oriented X 3. Psych:  Good affect, responds appropriately    ASSESSMENT AND PLAN

## 2012-11-03 NOTE — Assessment & Plan Note (Signed)
She is followed by Dr. Juanetta Gosling for this.  Recent labs during hospitalization show TC: 207, LDL 110, HDL 33, TG 319.  She should continue low cholesterol diet, simvistatin 40 mg daily.  Follow up labs per Dr. Juanetta Gosling

## 2012-11-03 NOTE — Assessment & Plan Note (Signed)
She is without complaint of chest pain with increased dose of Bystolic, with addition of Imdur 30 mg daily. She is continuing to have headache's but they are getting better. I have suggested that she cut back on the dose of Imdur to 15mg  daily to see if she remains pain free to assist with headaches.  She will give this a try. Should she have return of chest pain symptoms she is to go back up to the full 30 mg dose. We will see her in 3 months unless she is symptomatic.

## 2012-11-03 NOTE — Assessment & Plan Note (Signed)
Most recent echocardiogram completed on 10/24/2012 Mitral valve prolapse involving the posterior leaflet with normal flow velocity. Mild regurgitation. Peak gradient 3 mm Hg.   She is without complaint of shortness of breath or recurrent chest pain. Will continue to monitor this with follow up echocardiograms in the coming years if she is symptomatic.

## 2012-11-03 NOTE — Assessment & Plan Note (Signed)
She is requesting smoking cessation help. She is on klonopin for anxiety. I am uncomfortable starting her on Chantix with this medication on board. She has not history of seizures unless she takes Haldol.  I will defer to Dr. Juanetta Gosling to place her on this medication. She is is given the phone number 1-800 NOW QUIT, which is the [facility] smoking cessation service for patients. They may be able to get her into a class soon to support her desire to quit.

## 2012-11-06 ENCOUNTER — Other Ambulatory Visit: Payer: Self-pay | Admitting: Obstetrics and Gynecology

## 2012-11-06 DIAGNOSIS — N951 Menopausal and female climacteric states: Secondary | ICD-10-CM

## 2012-11-11 ENCOUNTER — Ambulatory Visit: Payer: Managed Care, Other (non HMO) | Admitting: Gastroenterology

## 2012-11-17 ENCOUNTER — Ambulatory Visit
Admission: RE | Admit: 2012-11-17 | Discharge: 2012-11-17 | Disposition: A | Discharge status: Home / Self Care | Payer: Managed Care, Other (non HMO) | Source: Ambulatory Visit | Attending: Obstetrics and Gynecology | Admitting: Obstetrics and Gynecology

## 2012-11-17 DIAGNOSIS — N951 Menopausal and female climacteric states: Secondary | ICD-10-CM

## 2012-11-27 ENCOUNTER — Telehealth: Payer: Self-pay | Admitting: Adult Health

## 2012-11-27 NOTE — Telephone Encounter (Signed)
Patient called and states she has taken her Imdur as recommended, taking 1/2 pill for the headaches, but it is still not helping.  Is there another medication she can try?  Patient understands may get a call back tomorrow.

## 2012-11-27 NOTE — Telephone Encounter (Signed)
Pt clarified that she reduced to half tabs on 11-04-12 following last OV to reduce headaches and still notes headaches every night about and hour and a half past the time she takes the medication, please advise

## 2012-11-28 NOTE — Telephone Encounter (Signed)
.  left message to have patient return my call.  

## 2012-11-28 NOTE — Telephone Encounter (Signed)
She was placed on Imdur for recurrent chest pain with dose reduced due to headaches. She should be getting used to medication over time. If she is still having headache's she can take a break from the imdur for a couple of days.. If she has recurrent chest pain she will need to call us.

## 2012-12-01 NOTE — Telephone Encounter (Signed)
Spoke to pt to advise results/instructions. Pt understood. Pt will call the office if she notes recurrent chest pains

## 2012-12-18 ENCOUNTER — Other Ambulatory Visit: Payer: Self-pay | Admitting: *Deleted

## 2012-12-22 ENCOUNTER — Ambulatory Visit (INDEPENDENT_AMBULATORY_CARE_PROVIDER_SITE_OTHER): Payer: Managed Care, Other (non HMO) | Admitting: Gastroenterology

## 2012-12-22 ENCOUNTER — Encounter: Payer: Self-pay | Admitting: Gastroenterology

## 2012-12-22 VITALS — BP 120/80 | HR 72 | Ht 64.0 in | Wt 175.0 lb

## 2012-12-22 DIAGNOSIS — R079 Chest pain, unspecified: Secondary | ICD-10-CM

## 2012-12-22 MED ORDER — NIFEDIPINE 20 MG PO CAPS: 20.0000 mg | ORAL_CAPSULE | Freq: Three times a day (TID) | ORAL | Status: DC

## 2012-12-22 NOTE — Progress Notes (Signed)
History of Present Illness:  The patient has returned following hospitalization for chest pain. Cardiac catheterization did not demonstrate significant coronary artery disease. Upper endoscopy was negative. She's had at least 4 episodes of spontaneous chest pain since discharge in mid November consisting of chest tightness radiating to the shoulders and arm. Pain is relieved with nitroglycerin. According to the patient, cardiology feels that this is not due to cardiac pain. It is noteworthy that she has mitral valve prolapse. Question of esophageal spasm has been raised. She denies pyrosis.  She remains on Protonix.    Review of Systems: Pertinent positive and negative review of systems were noted in the above HPI section. All other review of systems were otherwise negative.    Current Medications, Allergies, Past Medical History, Past Surgical History, Family History and Social History were reviewed in [employer] electronic medical record  Vital signs were reviewed in today's medical record. Physical Exam: General: Well developed , well nourished, no acute distress

## 2012-12-22 NOTE — Assessment & Plan Note (Addendum)
Etiology for chest pain is uncertain. Esophageal spasm may present in this manner. Alternatively pain could be cardiac, either due to coronary artery spasm or perhaps her prolapsed mitral valve.  Recommendations #1 trial of nifedipine 20 mg 3 times a day #2 if patient continues with chest pain despite medication would consider esophageal manometry

## 2012-12-22 NOTE — Patient Instructions (Addendum)
Medication sent to pharmacy Schedule a follow up for 8-10 weeks

## 2012-12-26 ENCOUNTER — Ambulatory Visit: Payer: Managed Care, Other (non HMO) | Admitting: Adult Health

## 2013-01-05 ENCOUNTER — Telehealth: Payer: Self-pay | Admitting: *Deleted

## 2013-01-05 NOTE — Telephone Encounter (Signed)
Patient called with questions in regards to letter notifying her of Dr. Renelda Loma departure. Explained to patient that we are working to get all patients reassigned to new providers, and that research staff is following this process particularly closely, to ensure protocol compliance.  Patient states that she was seen by Dr. 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 She states she is undergoing additional testing. She also notes that her GI evaluation was negative for a gastrointestinal disorder (esophageal spasm) and that she will have a follow-up appointment with cardiology in February.

## 2013-01-31 ENCOUNTER — Other Ambulatory Visit: Payer: Self-pay

## 2013-02-03 ENCOUNTER — Encounter: Payer: Self-pay | Admitting: Adult Health

## 2013-02-03 ENCOUNTER — Ambulatory Visit (INDEPENDENT_AMBULATORY_CARE_PROVIDER_SITE_OTHER): Payer: Managed Care, Other (non HMO) | Admitting: Adult Health

## 2013-02-03 VITALS — BP 126/80 | HR 87 | Ht 64.0 in | Wt 179.0 lb

## 2013-02-03 DIAGNOSIS — R079 Chest pain, unspecified: Secondary | ICD-10-CM

## 2013-02-03 DIAGNOSIS — I10 Essential (primary) hypertension: Secondary | ICD-10-CM

## 2013-02-03 DIAGNOSIS — Z720 Tobacco use: Secondary | ICD-10-CM

## 2013-02-03 DIAGNOSIS — F17200 Nicotine dependence, unspecified, uncomplicated: Secondary | ICD-10-CM

## 2013-02-03 MED ORDER — NIFEDIPINE ER OSMOTIC RELEASE 90 MG PO TB24: 90.0000 mg | ORAL_TABLET | Freq: Every day | ORAL | Status: DC

## 2013-02-03 NOTE — Progress Notes (Signed)
HPI: [name] is a 52 y/o patient of Dr. [name] we are seeing for ongoing assessment and treatment of chronic chest pain, with nonobstructive CAD per cardiac cath in November of 2013. She has a history of hypertension, mild mitral valve regurgitation GERD, hypercholesterolemia and anxiety.. She continues to have complaints of chest discomfort. She was seen by Dr. Arlyce Dice for GI issues had an EGD and is being treated for GERD with PPI. He sent her back to cardiology for ongoing chest discomfort and placed her on procardia 20 mg TID, took her off of nitrates as she was having chronic headaches from the medication. He did not think her chest discomfort was GI in etiology. She states the pain comes and goes with exertion. She continues to smoke but is down to 2 cigarettes a day.  Allergies  Allergen Reactions  . Bee Venom Anaphylaxis  . Haloperidol Lactate Other (See Comments)    Convulsions   . Meperidine Hcl Hives    Current Outpatient Prescriptions  Medication Sig Dispense Refill  . aspirin EC 81 MG EC tablet Take 1 tablet (81 mg total) by mouth daily.      . clonazePAM (KLONOPIN) 1 MG tablet 1 mg 3 (three) times daily as needed. For anxiety      . EPINEPHrine (EPI-PEN) 0.3 mg/0.3 mL DEVI Inject 0.3 mg into the muscle as needed. For bee venom      . fish oil-omega-3 fatty acids 1000 MG capsule Take 2 g by mouth every morning.      . Multiple Vitamin (MULITIVITAMIN WITH MINERALS) TABS Take 1 tablet by mouth every morning.      . nitroGLYCERIN (NITROSTAT) 0.4 MG SL tablet Place 1 tablet (0.4 mg total) under the tongue every 5 (five) minutes x 3 doses as needed for chest pain.  25 tablet  3  . pantoprazole (PROTONIX) 40 MG tablet Take 1 tablet (40 mg total) by mouth daily before supper.  30 tablet  3  . predniSONE (DELTASONE) 5 MG tablet Take 5 mg by mouth as directed.       . simvastatin (ZOCOR) 40 MG tablet Take 40 mg by mouth every morning.       . sodium chloride (MURO 128) 5 % ophthalmic  solution Place 1 drop into both eyes 3 (three) times daily.      Marland Kitchen NIFEdipine (PROCARDIA XL/ADALAT-CC) 90 MG 24 hr tablet Take 1 tablet (90 mg total) by mouth daily.  30 tablet  6   No current facility-administered medications for this visit.    Past Medical History  Diagnosis Date  . Fuchs' corneal dystrophy   . HTN (hypertension)   . High cholesterol   . Hx of mitral valve prolapse 1980  . GERD (gastroesophageal reflux disease)   . Arthritis   . Anxiety   . Breast cancer 02/22/2012    right T1c, N0    Past Surgical History  Procedure Laterality Date  . Carpal tunnel r/l  1992  . Ray  cage fusion  1997  . A cervical spine fusion x2 since last visit    . Elbow surgery   tennis elbow release 2012  . Abdominal hysterectomy  1999  . Sentinel lymph node biopsy  10/30/2002    right  . Ovarian cyst removal      prior to hysterectomy, right  . Breast lumpectomy  10/30/2002    right  . Esophagogastroduodenoscopy  10/26/2012    Procedure: ESOPHAGOGASTRODUODENOSCOPY (EGD);  Surgeon: Louis Meckel, MD;  Location: [facility] ENDOSCOPY;  Service: Endoscopy;  Laterality: N/A;    ZOX:WRUEAV of systems complete and found to be negative unless listed above  PHYSICAL EXAM BP 126/80  Pulse 87  Ht 5\' 4"  (1.626 m)  Wt 179 lb (81.194 kg)  BMI 30.71 kg/m2  SpO2 97%  General: Well developed, well nourished, in no acute distress Head: Eyes PERRLA, No xanthomas.   Normal cephalic and atramatic  Lungs: Clear bilaterally to auscultation and percussion. Heart: HRRR S1 S2, without MRG.  Pulses are 2+ & equal.            No carotid bruit. No JVD.  No abdominal bruits. No femoral bruits. Abdomen: Bowel sounds are positive, abdomen soft and non-tender without masses or                  Hernia's noted. Msk:  Back normal, normal gait. Normal strength and tone for age. Extremities: No clubbing, cyanosis or edema.  DP +1 Neuro: Alert and oriented X 3. Psych:  Good affect, responds appropriately   EKG:  NSR with nonspecific T-wave abnormalities in the septal leads.  ASSESSMENT AND PLAN

## 2013-02-03 NOTE — Assessment & Plan Note (Signed)
She has multiple somatic complaints with chest pain changing from rest to exertion and back to rest again. I have reassured her about her cardiac cath results. She may be having some bronchospasm with use of Bystolic. I have discussed this with Dr. Dietrich Pates on site and also with procardia now started. Will d/c Bystolic and increase procardia to 90 mg long acting dose. Still not convinced that her pain is cardiac in etiology but will see if CCB that she is already tolerating will assist in her symptoms. She will return in a couple of weeks for BP check and see Dr.McDowell in one month.

## 2013-02-03 NOTE — Assessment & Plan Note (Signed)
Unfortunately continues to smoke, but states she is down to 2 cigarettes a day. Encouraged her to quit.

## 2013-02-03 NOTE — Progress Notes (Deleted)
Name: Rachel Sutton    DOB: Oct 07, 1961  Age: 52 y.o.  MR#: [id number]       PCP:  Fredirick Maudlin, MD      Insurance: Payor: Monia Pouch  Plan: [employer] MANAGED  Product Type: *No Product type*    CC:   No chief complaint on file.   VS Filed Vitals:   02/03/13 1447  BP: 126/80  Pulse: 87  Height: 5\' 4"  (1.626 m)  Weight: 179 lb (81.194 kg)  SpO2: 97%    Weights Current Weight  02/03/13 179 lb (81.194 kg)  12/22/12 175 lb (79.379 kg)  11/03/12 172 lb (78.019 kg)    Blood Pressure  BP Readings from Last 3 Encounters:  02/03/13 126/80  12/22/12 120/80  11/03/12 114/60     Admit date:  (Not on file) Last encounter with [facility]:  12/26/2012   Allergy Bee venom; Haloperidol lactate; and Meperidine hcl  Current Outpatient Prescriptions  Medication Sig Dispense Refill  . aspirin EC 81 MG EC tablet Take 1 tablet (81 mg total) by mouth daily.      . clonazePAM (KLONOPIN) 1 MG tablet 1 mg 3 (three) times daily as needed. For anxiety      . EPINEPHrine (EPI-PEN) 0.3 mg/0.3 mL DEVI Inject 0.3 mg into the muscle as needed. For bee venom      . fish oil-omega-3 fatty acids 1000 MG capsule Take 2 g by mouth every morning.      . Multiple Vitamin (MULITIVITAMIN WITH MINERALS) TABS Take 1 tablet by mouth every morning.      . nebivolol (BYSTOLIC) 10 MG tablet Take 1 tablet (10 mg total) by mouth every morning.  30 tablet  3  . NIFEdipine (PROCARDIA) 20 MG capsule Take 1 capsule (20 mg total) by mouth 3 (three) times daily.  90 capsule  2  . nitroGLYCERIN (NITROSTAT) 0.4 MG SL tablet Place 1 tablet (0.4 mg total) under the tongue every 5 (five) minutes x 3 doses as needed for chest pain.  25 tablet  3  . pantoprazole (PROTONIX) 40 MG tablet Take 1 tablet (40 mg total) by mouth daily before supper.  30 tablet  3  . predniSONE (DELTASONE) 5 MG tablet Take 5 mg by mouth as directed.       . simvastatin (ZOCOR) 40 MG tablet Take 40 mg by mouth every morning.       . sodium chloride (MURO 128) 5 %  ophthalmic solution Place 1 drop into both eyes 3 (three) times daily.       No current facility-administered medications for this visit.    Discontinued Meds:    Medications Discontinued During This Encounter  Medication Reason  . celecoxib (CELEBREX) 200 MG capsule Error  . diclofenac (CATAFLAM) 50 MG tablet Error    Patient Active Problem List  Diagnosis  . KNEE, ARTHRITIS, DEGEN./OSTEO  . DERANGEMENT MENISCUS  . JOINT EFFUSION, LEFT KNEE  . KNEE PAIN  . LATERAL EPICONDYLITIS  . OA (osteoarthritis) of knee  . Medial meniscus, posterior horn derangement  . Breast cancer  . S/P left knee arthroscopy  . Knee pain  . Knee stiffness  . Difficulty in walking  . Chest pain at rest  . Mitral valve prolapse, nonrheumatic  . Hyperlipidemia  . HTN (hypertension)  . Tobacco abuse    LABS @BMET3 @  @CMPRESULT3 @ @CBC3 @  Lipid Panel     Component Value Date/Time   CHOL 207* 10/23/2012 0506   TRIG 319* 10/23/2012 0506  HDL 33* 10/23/2012 0506   CHOLHDL 6.3 10/23/2012 0506   VLDL 64* 10/23/2012 0506   LDLCALC 110* 10/23/2012 0506    ABG No results found for this basename: phart, pco2, pco2art, po2, po2art, hco3, tco2, acidbasedef, o2sat     BNP (last 3 results) No results found for this basename: PROBNP,  in the last 8760 hours Cardiac Panel (last 3 results) No results found for this basename: CKTOTAL, CKMB, TROPONINI, RELINDX,  in the last 72 hours  Iron/TIBC/Ferritin No results found for this basename: iron, tibc, ferritin     EKG Orders placed during the hospital encounter of 10/22/12  . EKG 12-LEAD  . EKG 12-LEAD  . EKG 12-LEAD  . ED EKG  . EKG 12-LEAD  . ED EKG  . ED EKG  . ED EKG  . EKG  . EKG 12-LEAD  . EKG 12-LEAD  . EKG 12-LEAD  . ED EKG  . ED EKG  . ED EKG  . EKG 12-LEAD  . EKG 12-LEAD  . ED EKG  . ED EKG  . ED EKG  . ED EKG  . ED EKG  . ED EKG  . EKG 12-LEAD  . EKG 12-LEAD     Prior Assessment and Plan Problem List as of 02/03/2013      ICD-9-CM     Cardiology Problems   Mitral valve prolapse, nonrheumatic   Last Assessment & Plan   11/03/2012 Office Visit Written 11/03/2012  1:41 PM by Jodelle Gross, NP     Most recent echocardiogram completed on 10/24/2012 Mitral valve prolapse involving the posterior leaflet with normal flow velocity. Mild regurgitation. Peak gradient 3 mm Hg.   She is without complaint of shortness of breath or recurrent chest pain. Will continue to monitor this with follow up echocardiograms in the coming years if she is symptomatic.    Hyperlipidemia   Last Assessment & Plan   11/03/2012 Office Visit Written 11/03/2012  2:02 PM by Jodelle Gross, NP     She is followed by Dr. Juanetta Gosling for this.  Recent labs during hospitalization show TC: 207, LDL 110, HDL 33, TG 319.  She should continue low cholesterol diet, simvistatin 40 mg daily.  Follow up labs per Dr. Juanetta Gosling     HTN (hypertension)     Other   KNEE, ARTHRITIS, DEGEN./OSTEO   DERANGEMENT MENISCUS   JOINT EFFUSION, LEFT KNEE   KNEE PAIN   LATERAL EPICONDYLITIS   OA (osteoarthritis) of knee   Medial meniscus, posterior horn derangement   Breast cancer   S/P left knee arthroscopy   Knee pain   Knee stiffness   Difficulty in walking   Chest pain at rest   Last Assessment & Plan   12/22/2012 Office Visit Edited 12/23/2012 10:28 AM by Louis Meckel, MD     Etiology for chest pain is uncertain. Esophageal spasm may present in this manner. Alternatively pain could be cardiac, either due to coronary artery spasm or perhaps her prolapsed mitral valve.  Recommendations #1 trial of nifedipine 20 mg 3 times a day #2 if patient continues with chest pain despite medication would consider esophageal manometry    Tobacco abuse   Last Assessment & Plan   11/03/2012 Office Visit Written 11/03/2012  2:04 PM by Jodelle Gross, NP     She is requesting smoking cessation help. She is on klonopin for anxiety. I am uncomfortable starting her on  Chantix with this medication on board. She has not history  of seizures unless she takes Haldol.  I will defer to Dr. Juanetta Gosling to place her on this medication. She is is given the phone number 1-800 NOW QUIT, which is the [facility] smoking cessation service for patients. They may be able to get her into a class soon to support her desire to quit.        Imaging: No results found.

## 2013-02-03 NOTE — Assessment & Plan Note (Signed)
Currently well controlled. With medication changes, will follow up with BP check.

## 2013-02-03 NOTE — Patient Instructions (Addendum)
Your physician recommends that you schedule a follow-up appointment in:  1 - 2 week blood pressure check 2 - 1 month with Dr [name]  Your physician has recommended you make the following change in your medication:  1 - STOP Bystolic 2 - CHANGE Procardia (Nifedipine) to 90 mg daily

## 2013-02-09 ENCOUNTER — Telehealth: Payer: Self-pay | Admitting: Adult Health

## 2013-02-09 MED ORDER — HYDROCHLOROTHIAZIDE 12.5 MG PO CAPS: 12.5000 mg | ORAL_CAPSULE | Freq: Every day | ORAL | Status: DC

## 2013-02-09 NOTE — Telephone Encounter (Signed)
Patient states that she was taken off of her BP medicine at visit with Samara Deist.  States that her BP has been elevated all weekend.  Please return phone call. / tgs

## 2013-02-09 NOTE — Telephone Encounter (Signed)
She has a lot of anxiety issues. We took her off of bystolic due to recurrent chest pain thinking it was related to bronchospasms and asked her to quit smoking. We increased her procardia to 90 mg daily. Add dose HCTZ 12.5 mg daily to her medication regimen.  She should keep her follow up with Dr. [name] next week. She had a NORMAL cardiac cath last year. Not cardiac chest pain.

## 2013-02-09 NOTE — Telephone Encounter (Signed)
Called pt at work per noted incoming call from pt work, pt clarified that she notes a lot of headaches (worse today, 3 over the weekend) this weekend, 183/117 this morning, stopped bystolic last week and to have f/u BP check in 2 weeks, highest on 02-08-13 167/110, noted Saturday evening, three days after stopping the bystolic, average readings 150's/low 100's, still notes ongoing chest pain that she is currently being treated for, this has not increased since ceasing of medication, denies neck/arm/jaw pain, noted SOB twice this past weekend during chest pain that is still noted as going upward towards left shoulder, pt advised to go to ED if sxs worsen, please advise

## 2013-02-09 NOTE — Telephone Encounter (Signed)
Spoke to pt to advise results/instructions. Pt understood.new Rx sent to pharmacy via escribe Pt advised that she does not have an apt with MD SM next week, noted NV on 02-19-13 at 4:15pm, F/U with MD SM noted on 03-24-13, MD SM will not be in the office on NV date, please advise if pt needs sooner apt with MD SM

## 2013-02-09 NOTE — Telephone Encounter (Signed)
Keep scheduled appt with Dr. [name].

## 2013-02-11 NOTE — Telephone Encounter (Signed)
Noted pt aware and will keep both upcoming NV and OV apt's

## 2013-02-19 ENCOUNTER — Encounter: Payer: Self-pay | Admitting: *Deleted

## 2013-02-19 ENCOUNTER — Ambulatory Visit (INDEPENDENT_AMBULATORY_CARE_PROVIDER_SITE_OTHER): Payer: Managed Care, Other (non HMO) | Admitting: *Deleted

## 2013-02-19 VITALS — BP 124/86 | HR 84 | Ht 64.0 in | Wt 179.4 lb

## 2013-02-19 DIAGNOSIS — I10 Essential (primary) hypertension: Secondary | ICD-10-CM

## 2013-02-19 NOTE — Patient Instructions (Addendum)
Your physician recommends that you schedule a follow-up appointment in: WITH MD MCDOWELL AS ALREADY SCHEDULED FOR 03-24-13  Your physician recommends that you continue on your current medications as directed. Please refer to the Current Medication list given to you today.

## 2013-02-19 NOTE — Progress Notes (Signed)
Pt presented to office for NV to check BP No complaints noted  Last OV VS/D/C instructions: 02-03-13  PHYSICAL EXAM  BP 126/80  Pulse 87  Ht 5\' 4"  (1.626 m)  Wt 179 lb (81.194 kg)  BMI 30.71 kg/m2  SpO2 97%    Your physician recommends that you schedule a follow-up appointment in:  1 - 2 week blood pressure check  2 - 1 month with Dr [name]  Your physician has recommended you make the following change in your medication:  1 - STOP Bystolic  2 - CHANGE Procardia (Nifedipine) to 90 mg daily   NP KL advised for pt to continue current medication regiman and keep f/u apt with MD SM 03-24-13

## 2013-02-20 NOTE — Progress Notes (Signed)
I reviewed the records and it appears that I have never seen this patient. She was seen by Dr. Eden Emms in November at which time she had a heart catheterization. Followup was to be with Dr. Dietrich Pates per D/C summary and she has seen Ms. Lawrence NP since then.

## 2013-03-01 ENCOUNTER — Other Ambulatory Visit (HOSPITAL_COMMUNITY): Payer: Self-pay | Admitting: Physician Assistant

## 2013-03-04 NOTE — Progress Notes (Signed)
Was advised pt will be seen by MD SM on 03-24-13, please advise if any further instructions are needed for pt prior to upcoming OV concerning results from this NV findings

## 2013-03-07 ENCOUNTER — Encounter: Payer: Self-pay | Admitting: Oncology

## 2013-03-07 ENCOUNTER — Telehealth: Payer: Self-pay | Admitting: Oncology

## 2013-03-07 NOTE — Telephone Encounter (Signed)
, °

## 2013-03-16 ENCOUNTER — Other Ambulatory Visit: Payer: Self-pay | Admitting: *Deleted

## 2013-03-16 DIAGNOSIS — C50911 Malignant neoplasm of unspecified site of right female breast: Secondary | ICD-10-CM

## 2013-03-16 DIAGNOSIS — Z1231 Encounter for screening mammogram for malignant neoplasm of breast: Secondary | ICD-10-CM

## 2013-03-17 ENCOUNTER — Telehealth: Payer: Self-pay | Admitting: Oncology

## 2013-03-17 NOTE — Telephone Encounter (Signed)
LMONVM OF THE PT ADVISING HER OF THE June APPTS THAT ARE SCHEDULED THE PT WILL BE SEEING DR MAGRINAT INSTEAD OF THE OF THE PA. PT DUE TO BE RELEASED FROM THE PRACTICE.

## 2013-03-24 ENCOUNTER — Ambulatory Visit (INDEPENDENT_AMBULATORY_CARE_PROVIDER_SITE_OTHER): Payer: Managed Care, Other (non HMO) | Admitting: Adult Health

## 2013-03-24 ENCOUNTER — Encounter: Payer: Self-pay | Admitting: Adult Health

## 2013-03-24 ENCOUNTER — Ambulatory Visit: Payer: Managed Care, Other (non HMO) | Admitting: Cardiology

## 2013-03-24 VITALS — BP 120/84 | HR 115 | Ht 64.0 in | Wt 179.1 lb

## 2013-03-24 DIAGNOSIS — F17200 Nicotine dependence, unspecified, uncomplicated: Secondary | ICD-10-CM

## 2013-03-24 DIAGNOSIS — R079 Chest pain, unspecified: Secondary | ICD-10-CM

## 2013-03-24 DIAGNOSIS — I10 Essential (primary) hypertension: Secondary | ICD-10-CM

## 2013-03-24 DIAGNOSIS — Z720 Tobacco use: Secondary | ICD-10-CM

## 2013-03-24 NOTE — Assessment & Plan Note (Signed)
She states that she continues to smoke but has cut down to 2 cigarettes a day. Have encouraged her to quit completely.

## 2013-03-24 NOTE — Assessment & Plan Note (Signed)
She continues to have some left-sided chest pain, radiating up into her left neck. Changes in medication did not help. I worried that beta blocker may be causing her to have some bronchospasms. She has found no relief of occasional chest pain with use of Procardia. I do not believe this is related to cardiac etiology. She had an essentially normal cardiac catheterization in November of 2013. I advised her to followup with her primary care physician to look for other etiologies of discomfort which could be related to her pain to include cervical disc disease o or musculoskeletal etiology for her discomfort.

## 2013-03-24 NOTE — Assessment & Plan Note (Signed)
Pressure today is excellent. She states that she takes her blood pressure at home and found it to be elevated at 195/115 . I will place a 24-hour blood pressure and will try to monitor on her to evaluate her blood pressure at home. May need to consider placing her back on beta blocker as her heart rate is mildly elevated on this visit. I advised her to stop smoking. She will followup with Dr. [name] in one month.

## 2013-03-24 NOTE — Patient Instructions (Addendum)
24 Hr Blood Pressure Monitor  Lab Work today ( bmet )   Your physician wants you to follow-up in: 5 months. You will receive a reminder letter in the mail two months in advance. If you don't receive a letter, please call our office to schedule the follow-up appointment.

## 2013-03-24 NOTE — Progress Notes (Signed)
HPI: [name] is a 52 year old patient of Dr. Diona Sutton worsening for ongoing assessment and management of chronic chest pain, with history of nonobstructive CAD per cardiac catheterization in November 2013. She was last seen in the office in February 2014 with complaints of ongoing discomfort in her chest. The patient had multiple somatic complaints, it was not found to have the etiology of chest discomfort. Bystolic was discontinued and she was started on Procardia 90 mg long-acting dose. It was hoped that calcium channel blocker would assist her blood pressure and anginal symptoms. She is here for blood pressure evaluation of patient's response to treatment.    She continues to have chest pain radiating up to her neck. Procardia does not help her with symptoms. She states that her BP has been elevated at home on her monitor the air.. She continues to smoke. He continues to have periods of anxiety.  Allergies  Allergen Reactions  . Bee Venom Anaphylaxis  . Haloperidol Lactate Other (See Comments)    Convulsions   . Meperidine Hcl Hives    Current Outpatient Prescriptions  Medication Sig Dispense Refill  . aspirin EC 81 MG EC tablet Take 1 tablet (81 mg total) by mouth daily.      . clonazePAM (KLONOPIN) 1 MG tablet 1 mg 3 (three) times daily as needed. For anxiety      . EPINEPHrine (EPI-PEN) 0.3 mg/0.3 mL DEVI Inject 0.3 mg into the muscle as needed. For bee venom      . fish oil-omega-3 fatty acids 1000 MG capsule Take 2 g by mouth every morning.      . hydrochlorothiazide (MICROZIDE) 12.5 MG capsule Take 1 capsule (12.5 mg total) by mouth daily.  90 capsule  1  . Multiple Vitamin (MULITIVITAMIN WITH MINERALS) TABS Take 1 tablet by mouth every morning.      Marland Kitchen NIFEdipine (PROCARDIA XL/ADALAT-CC) 90 MG 24 hr tablet Take 1 tablet (90 mg total) by mouth daily.  30 tablet  6  . nitroGLYCERIN (NITROSTAT) 0.4 MG SL tablet Place 1 tablet (0.4 mg total) under the tongue every 5 (five) minutes x 3  doses as needed for chest pain.  25 tablet  3  . pantoprazole (PROTONIX) 40 MG tablet TAKE ONE TABLET BY MOUTH EVERY DAY BEFORE SUPPER  30 tablet  4  . simvastatin (ZOCOR) 40 MG tablet Take 40 mg by mouth every morning.       . sodium chloride (MURO 128) 5 % ophthalmic solution 1 drop as needed.      . predniSONE (DELTASONE) 5 MG tablet Take 5 mg by mouth as directed.        No current facility-administered medications for this visit.    Past Medical History  Diagnosis Date  . Fuchs' corneal dystrophy   . HTN (hypertension)   . High cholesterol   . Hx of mitral valve prolapse 1980  . GERD (gastroesophageal reflux disease)   . Arthritis   . Anxiety   . Breast cancer 02/22/2012    right T1c, N0    Past Surgical History  Procedure Laterality Date  . Carpal tunnel r/l  1992  . Ray  cage fusion  1997  . A cervical spine fusion x2 since last visit    . Elbow surgery   tennis elbow release 2012  . Abdominal hysterectomy  1999  . Sentinel lymph node biopsy  10/30/2002    right  . Ovarian cyst removal      prior to hysterectomy, right  .  Breast lumpectomy  10/30/2002    right  . Esophagogastroduodenoscopy  10/26/2012    Procedure: ESOPHAGOGASTRODUODENOSCOPY (EGD);  Surgeon: Rachel Meckel, MD;  Location: [facility] ENDOSCOPY;  Service: Endoscopy;  Laterality: N/A;    RUE:[id number] of systems complete and found to be negative unless listed above    PHYSICAL EXAM BP 120/84  Pulse 115  Ht 5\' 4"  (1.626 m)  Wt 179 lb 1.3 oz (81.23 kg)  BMI 30.72 kg/m2  General: Well developed, well nourished, in no acute distress Head: Eyes PERRLA, No xanthomas.   Normal cephalic and atramatic  Lungs: Clear bilaterally to auscultation and percussion. Heart: HRRR S1 S2, without MRG.  Pulses are 2+ & equal.            No carotid bruit. No JVD.  No abdominal bruits. No femoral bruits. Abdomen: Bowel sounds are positive, abdomen soft and non-tender without masses or                  Hernia's noted. Msk:   Back normal, normal gait. Normal strength and tone for age. Extremities: No clubbing, cyanosis or edema.  DP +1 Neuro: Alert and oriented X 3. Psych:  Good affect, responds appropriately  EKG: ST with PVC's/ Rate 115 bpm./  ASSESSMENT AND PLAN

## 2013-03-25 ENCOUNTER — Ambulatory Visit (HOSPITAL_COMMUNITY)
Admission: RE | Admit: 2013-03-25 | Discharge: 2013-03-25 | Disposition: A | Discharge status: Home / Self Care | Payer: Managed Care, Other (non HMO) | Source: Ambulatory Visit | Attending: Adult Health | Admitting: Adult Health

## 2013-03-25 DIAGNOSIS — I10 Essential (primary) hypertension: Secondary | ICD-10-CM

## 2013-03-25 LAB — BASIC METABOLIC PANEL
BUN: 16 mg/dL (ref 6–23)
CO2: 28 mEq/L (ref 19–32)
Calcium: 10 mg/dL (ref 8.4–10.5)
Chloride: 101 mEq/L (ref 96–112)
Creat: 0.83 mg/dL (ref 0.50–1.10)
Glucose, Bld: 97 mg/dL (ref 70–99)
Potassium: 4 mEq/L (ref 3.5–5.3)
Sodium: 139 mEq/L (ref 135–145)

## 2013-03-25 NOTE — Addendum Note (Signed)
Addended by: Derry Lory A on: 03/25/2013 11:13 AM   Modules accepted: Orders

## 2013-03-25 NOTE — Progress Notes (Signed)
Ambulatory Blood Pressure Monitor in progress 24 Hrs.

## 2013-03-26 ENCOUNTER — Ambulatory Visit: Payer: Managed Care, Other (non HMO)

## 2013-03-26 NOTE — Progress Notes (Signed)
Noted pt was seen by KL NP and the following d/c instructions noted:  24 Hr Blood Pressure Monitor  Lab Work today ( bmet )  Your physician wants you to follow-up in: 5 months. You will receive a reminder letter in the mail two months in advance. If you don't receive a letter, please call our office to schedule the follow-up appointment.   Thus NV for BP check was addressed at OV and this notation can now be closed

## 2013-04-02 ENCOUNTER — Encounter: Payer: Self-pay | Admitting: Cardiology

## 2013-04-02 ENCOUNTER — Ambulatory Visit
Admission: RE | Admit: 2013-04-02 | Discharge: 2013-04-02 | Disposition: A | Discharge status: Home / Self Care | Payer: Managed Care, Other (non HMO) | Source: Ambulatory Visit | Attending: Oncology | Admitting: Oncology

## 2013-04-02 DIAGNOSIS — Z1231 Encounter for screening mammogram for malignant neoplasm of breast: Secondary | ICD-10-CM

## 2013-04-03 ENCOUNTER — Ambulatory Visit: Payer: Managed Care, Other (non HMO)

## 2013-04-06 ENCOUNTER — Encounter: Payer: Self-pay | Admitting: *Deleted

## 2013-04-07 ENCOUNTER — Telehealth: Payer: Self-pay | Admitting: Cardiology

## 2013-04-07 NOTE — Telephone Encounter (Signed)
Pt aware of one month f/u with DR SM noted on 04-20-13, pt still wants results from BPmonitor results, please clarify if any further instructions to be given to pt prior to upcoming OV from BP monitor

## 2013-04-07 NOTE — Telephone Encounter (Signed)
Patient calling regarding the status of BP monitor results.

## 2013-04-09 NOTE — Telephone Encounter (Signed)
.  left message to have patient return my call.  

## 2013-04-09 NOTE — Telephone Encounter (Signed)
I have reviewed her BP recording 140/80 is the average. Some a little higher, some slightly lower less than 10% of the time. Continue current medication regimen.

## 2013-04-10 NOTE — Telephone Encounter (Signed)
Spoke to pt to advise results/instructions. Pt understood. Pt wanted to ask if KL had any recommendations for the HR per concerned with the high numbers, pt has upcoming apt with Dr Dionicia Abler on 04-20-13

## 2013-04-10 NOTE — Telephone Encounter (Signed)
No new recommendations. She is to speak with Dr. [name] about this with upcoming appt.

## 2013-04-13 NOTE — Telephone Encounter (Signed)
Spoke to pt to advise results/instructions. Pt understood.  

## 2013-04-13 NOTE — Telephone Encounter (Signed)
.  left message to have patient return my call on home and mobile number

## 2013-04-20 ENCOUNTER — Encounter: Payer: Self-pay | Admitting: Adult Health

## 2013-04-20 ENCOUNTER — Ambulatory Visit: Payer: Managed Care, Other (non HMO) | Admitting: Cardiology

## 2013-04-20 ENCOUNTER — Ambulatory Visit (INDEPENDENT_AMBULATORY_CARE_PROVIDER_SITE_OTHER): Payer: Managed Care, Other (non HMO) | Admitting: Adult Health

## 2013-04-20 VITALS — BP 120/80 | HR 118 | Wt 176.0 lb

## 2013-04-20 DIAGNOSIS — F17200 Nicotine dependence, unspecified, uncomplicated: Secondary | ICD-10-CM

## 2013-04-20 DIAGNOSIS — R002 Palpitations: Secondary | ICD-10-CM

## 2013-04-20 DIAGNOSIS — I10 Essential (primary) hypertension: Secondary | ICD-10-CM

## 2013-04-20 DIAGNOSIS — Z720 Tobacco use: Secondary | ICD-10-CM

## 2013-04-20 MED ORDER — METOPROLOL TARTRATE 25 MG PO TABS: 12.5000 mg | ORAL_TABLET | Freq: Two times a day (BID) | ORAL | Status: DC

## 2013-04-20 NOTE — Progress Notes (Deleted)
Name: Rachel Sutton    DOB: [birth year]  Age: 52 y.o.  MR#: [id number]       PCP:  Fredirick Maudlin, MD      Insurance: Payor: Monia Pouch  Plan: [employer] MANAGED  Product Type: *No Product type*    CC:    Chief Complaint  Patient presents with  . Hypertension  . Chest Pain    Chronic    VS Filed Vitals:   04/20/13 1448  BP: 120/80  Pulse: 118  Weight: 176 lb (79.833 kg)    Weights Current Weight  04/20/13 176 lb (79.833 kg)  03/24/13 179 lb 1.3 oz (81.23 kg)  02/19/13 179 lb 6.4 oz (81.375 kg)    Blood Pressure  BP Readings from Last 3 Encounters:  04/20/13 120/80  03/24/13 120/84  02/19/13 124/86     Admit date:  (Not on file) Last encounter with RMR:  03/24/2013   Allergy Bee venom; Haloperidol lactate; and Meperidine hcl  Current Outpatient Prescriptions  Medication Sig Dispense Refill  . aspirin EC 81 MG EC tablet Take 1 tablet (81 mg total) by mouth daily.      . Calcium-Magnesium-Vitamin D (CALCIUM 500 PO) Take 1 tablet by mouth 2 (two) times daily.      . clonazePAM (KLONOPIN) 1 MG tablet 1 mg 3 (three) times daily as needed. For anxiety      . EPINEPHrine (EPI-PEN) 0.3 mg/0.3 mL DEVI Inject 0.3 mg into the muscle as needed. For bee venom      . fish oil-omega-3 fatty acids 1000 MG capsule Take 2 g by mouth every morning.      . hydrochlorothiazide (MICROZIDE) 12.5 MG capsule Take 1 capsule (12.5 mg total) by mouth daily.  90 capsule  1  . Multiple Vitamin (MULITIVITAMIN WITH MINERALS) TABS Take 1 tablet by mouth every morning.      Marland Kitchen NIFEdipine (PROCARDIA XL/ADALAT-CC) 90 MG 24 hr tablet Take 1 tablet (90 mg total) by mouth daily.  30 tablet  6  . nitroGLYCERIN (NITROSTAT) 0.4 MG SL tablet Place 1 tablet (0.4 mg total) under the tongue every 5 (five) minutes x 3 doses as needed for chest pain.  25 tablet  3  . pantoprazole (PROTONIX) 40 MG tablet TAKE ONE TABLET BY MOUTH EVERY DAY BEFORE SUPPER  30 tablet  4  . simvastatin (ZOCOR) 40 MG tablet Take 40 mg by mouth every  morning.       . sodium chloride (MURO 128) 5 % ophthalmic solution 1 drop as needed.       No current facility-administered medications for this visit.    Discontinued Meds:    Medications Discontinued During This Encounter  Medication Reason  . predniSONE (DELTASONE) 5 MG tablet Error    Patient Active Problem List   Diagnosis Date Noted  . Hyperlipidemia 10/26/2012  . HTN (hypertension) 10/26/2012  . Tobacco abuse 10/26/2012  . Chest pain at rest 10/23/2012  . Mitral valve prolapse, nonrheumatic 10/23/2012  . Knee pain 05/08/2012  . Knee stiffness 05/08/2012  . Difficulty in walking 05/08/2012  . S/P left knee arthroscopy 05/05/2012  . Breast cancer 02/22/2012  . OA (osteoarthritis) of knee 08/07/2011  . Medial meniscus, posterior horn derangement 08/07/2011  . LATERAL EPICONDYLITIS 07/20/2010  . KNEE, ARTHRITIS, DEGEN./OSTEO 01/20/2009  . DERANGEMENT MENISCUS 01/20/2009  . JOINT EFFUSION, LEFT KNEE 01/20/2009  . KNEE PAIN 01/20/2009    LABS    Component Value Date/Time   NA 139 03/24/2013 1525   NA  138 10/23/2012 0506   NA 137 10/22/2012 1920   K 4.0 03/24/2013 1525   K 4.1 10/23/2012 0506   K 4.0 10/22/2012 1920   CL 101 03/24/2013 1525   CL 105 10/23/2012 0506   CL 101 10/22/2012 1920   CO2 28 03/24/2013 1525   CO2 25 10/23/2012 0506   CO2 22 10/22/2012 1920   GLUCOSE 97 03/24/2013 1525   GLUCOSE 91 10/23/2012 0506   GLUCOSE 83 10/22/2012 1920   BUN 16 03/24/2013 1525   BUN 20 10/23/2012 0506   BUN 20 10/22/2012 1920   CREATININE 0.83 03/24/2013 1525   CREATININE 0.73 10/23/2012 0506   CREATININE 0.72 10/22/2012 1920   CREATININE 0.83 05/27/2012 1503   CALCIUM 10.0 03/24/2013 1525   CALCIUM 9.1 10/23/2012 0506   CALCIUM 10.1 10/22/2012 1920   GFRNONAA >90 10/23/2012 0506   GFRNONAA >90 10/22/2012 1920   GFRNONAA >90 04/28/2012 1510   GFRAA >90 10/23/2012 0506   GFRAA >90 10/22/2012 1920   GFRAA >90 04/28/2012 1510   CMP     Component Value Date/Time   NA 139 03/24/2013 1525   K  4.0 03/24/2013 1525   CL 101 03/24/2013 1525   CO2 28 03/24/2013 1525   GLUCOSE 97 03/24/2013 1525   BUN 16 03/24/2013 1525   CREATININE 0.83 03/24/2013 1525   CREATININE 0.73 10/23/2012 0506   CALCIUM 10.0 03/24/2013 1525   PROT 6.4 10/25/2012 0520   ALBUMIN 3.3* 10/25/2012 0520   AST 21 10/25/2012 0520   ALT 19 10/25/2012 0520   ALKPHOS 96 10/25/2012 0520   BILITOT 0.4 10/25/2012 0520   GFRNONAA >90 10/23/2012 0506   GFRAA >90 10/23/2012 0506       Component Value Date/Time   WBC 9.1 10/23/2012 0506   WBC 10.1 10/22/2012 1920   WBC 10.5* 05/27/2012 1503   WBC 9.7 05/28/2011 1416   WBC 7.5 05/22/2010 1237   WBC 9.3 08/20/2008 1340   HGB 13.2 10/23/2012 0506   HGB 14.5 10/22/2012 1920   HGB 13.4 05/27/2012 1503   HGB 14.3 04/28/2012 1510   HGB 14.1 05/28/2011 1416   HGB 13.8 05/22/2010 1237   HCT 38.3 10/23/2012 0506   HCT 40.7 10/22/2012 1920   HCT 40.1 05/27/2012 1503   HCT 40.1 04/28/2012 1510   HCT 40.9 05/28/2011 1416   HCT 39.7 05/22/2010 1237   MCV 87.4 10/23/2012 0506   MCV 86.0 10/22/2012 1920   MCV 91.1 05/27/2012 1503   MCV 89.7 05/28/2011 1416   MCV 90.9 05/22/2010 1237   MCV 92.1 08/20/2008 1340    Lipid Panel     Component Value Date/Time   CHOL 207* 10/23/2012 0506   TRIG 319* 10/23/2012 0506   HDL 33* 10/23/2012 0506   CHOLHDL 6.3 10/23/2012 0506   VLDL 64* 10/23/2012 0506   LDLCALC 110* 10/23/2012 0506    ABG No results found for this basename: phart, pco2, pco2art, po2, po2art, hco3, tco2, acidbasedef, o2sat     No results found for this basename: TSH   BNP (last 3 results) No results found for this basename: PROBNP,  in the last 8760 hours Cardiac Panel (last 3 results) No results found for this basename: CKTOTAL, CKMB, TROPONINI, RELINDX,  in the last 72 hours  Iron/TIBC/Ferritin No results found for this basename: iron, tibc, ferritin     EKG Orders placed during the hospital encounter of 03/25/13  . AMB BP MONITORING  . AMB BP MONITORING     Prior  Assessment and Plan Problem  List as of 04/20/2013     ICD-9-CM     Cardiology Problems   Mitral valve prolapse, nonrheumatic   Last Assessment & Plan   11/03/2012 Office Visit Written 11/03/2012  1:41 PM by Jodelle Gross, NP     Most recent echocardiogram completed on 10/24/2012 Mitral valve prolapse involving the posterior leaflet with normal flow velocity. Mild regurgitation. Peak gradient 3 mm Hg.   She is without complaint of shortness of breath or recurrent chest pain. Will continue to monitor this with follow up echocardiograms in the coming years if she is symptomatic.    Hyperlipidemia   Last Assessment & Plan   11/03/2012 Office Visit Written 11/03/2012  2:02 PM by Jodelle Gross, NP     She is followed by Dr. Juanetta Gosling for this.  Recent labs during hospitalization show TC: 207, LDL 110, HDL 33, TG 319.  She should continue low cholesterol diet, simvistatin 40 mg daily.  Follow up labs per Dr. Juanetta Gosling     HTN (hypertension)   Last Assessment & Plan   03/24/2013 Office Visit Written 03/24/2013  3:26 PM by Jodelle Gross, NP     Pressure today is excellent. She states that she takes her blood pressure at home and found it to be elevated at 195/115 . I will place a 24-hour blood pressure and will try to monitor on her to evaluate her blood pressure at home. May need to consider placing her back on beta blocker as her heart rate is mildly elevated on this visit. I advised her to stop smoking. She will followup with Dr. [name] in one month.      Other   KNEE, ARTHRITIS, DEGEN./OSTEO   DERANGEMENT MENISCUS   JOINT EFFUSION, LEFT KNEE   KNEE PAIN   LATERAL EPICONDYLITIS   OA (osteoarthritis) of knee   Medial meniscus, posterior horn derangement   Breast cancer   S/P left knee arthroscopy   Knee pain   Knee stiffness   Difficulty in walking   Chest pain at rest   Last Assessment & Plan   03/24/2013 Office Visit Written 03/24/2013  3:25 PM by Jodelle Gross, NP     She continues to have some  left-sided chest pain, radiating up into her left neck. Changes in medication did not help. I worried that beta blocker may be causing her to have some bronchospasms. She has found no relief of occasional chest pain with use of Procardia. I do not believe this is related to cardiac etiology. She had an essentially normal cardiac catheterization in November of 2013. I advised her to followup with her primary care physician to look for other etiologies of discomfort which could be related to her pain to include cervical disc disease o or musculoskeletal etiology for her discomfort.    Tobacco abuse   Last Assessment & Plan   03/24/2013 Office Visit Written 03/24/2013  3:26 PM by Jodelle Gross, NP     She states that she continues to smoke but has cut down to 2 cigarettes a day. Have encouraged her to quit completely.        Imaging: Mm Digital Screening  04/02/2013  *RADIOLOGY REPORT*  Clinical Data: Screening.  DIGITAL BILATERAL SCREENING MAMMOGRAM WITH CAD  Comparison:  Previous exams.  FINDINGS:  ACR Breast Density Category 3: The breast tissue is heterogeneously dense.  No suspicious masses, architectural distortion, or calcifications are present.  Images were processed with CAD.  IMPRESSION: No mammographic evidence of malignancy.  A result letter of this screening mammogram will be mailed directly to the patient.  RECOMMENDATION: Screening mammogram in one year. (Code:SM-B-01Y)  BI-RADS CATEGORY 1:  Negative.   Original Report Authenticated By: Beckie Salts, M.D.

## 2013-04-20 NOTE — Patient Instructions (Addendum)
Your physician has recommended you make the following change in your medication: Start Metoprolol Tartrate 12.5 mg twice daily.  Your physician recommends that you schedule a follow-up appointment in: 1 month with Dr. Dietrich Pates

## 2013-04-20 NOTE — Assessment & Plan Note (Addendum)
Unfortunately continues to smoke, but states only two cigarettes a day.I have asked her to quit completely

## 2013-04-20 NOTE — Assessment & Plan Note (Signed)
Review of ambulatory blood pressure monitor has SBP 1-% > 140, with maximum BP systolic of 162 DBP 17% >90 single value of 120 but otherwise most 90-100. I will ascertain how addition of metoprolol helps her with BP control,.

## 2013-04-20 NOTE — Assessment & Plan Note (Signed)
She continues to have episodes of palpitations. We have made many changes on her medications. Heart monitor demonstrated average HR of 102 with rates as high as 120 bpm transiently. I will try her on low dose metoprolol 12.5 mg BID to see how she tolerates this. She will follow up with Dr. [name] for further recommendations. She is to continue on procardia.

## 2013-04-20 NOTE — Progress Notes (Signed)
HPI [name] is a 52 year old patient of Dr. [name] were following for ongoing assessment and management of chronic chest pain, history of nonobstructive CAD for cardiac catheterization in November 2013, and hypertension. The patient comes often with multiple somatic complaints. On last visit the patient is advised to follow up with her primary care physician concerning this neck pain and evaluation for cervical disc disease or musculoskeletal etiology for her chest discomfort. She unfortunately continues to smoke.   Pain is less frequent, not using NTG, as much.Still having some episodes of palpitations, sometimes awakening her at night.  Allergies  Allergen Reactions  . Bee Venom Anaphylaxis  . Haloperidol Lactate Other (See Comments)    Convulsions   . Meperidine Hcl Hives    Current Outpatient Prescriptions  Medication Sig Dispense Refill  . aspirin EC 81 MG EC tablet Take 1 tablet (81 mg total) by mouth daily.      . Calcium-Magnesium-Vitamin D (CALCIUM 500 PO) Take 1 tablet by mouth 2 (two) times daily.      . clonazePAM (KLONOPIN) 1 MG tablet 1 mg 3 (three) times daily as needed. For anxiety      . EPINEPHrine (EPI-PEN) 0.3 mg/0.3 mL DEVI Inject 0.3 mg into the muscle as needed. For bee venom      . fish oil-omega-3 fatty acids 1000 MG capsule Take 2 g by mouth every morning.      . hydrochlorothiazide (MICROZIDE) 12.5 MG capsule Take 1 capsule (12.5 mg total) by mouth daily.  90 capsule  1  . Multiple Vitamin (MULITIVITAMIN WITH MINERALS) TABS Take 1 tablet by mouth every morning.      Marland Kitchen NIFEdipine (PROCARDIA XL/ADALAT-CC) 90 MG 24 hr tablet Take 1 tablet (90 mg total) by mouth daily.  30 tablet  6  . nitroGLYCERIN (NITROSTAT) 0.4 MG SL tablet Place 1 tablet (0.4 mg total) under the tongue every 5 (five) minutes x 3 doses as needed for chest pain.  25 tablet  3  . pantoprazole (PROTONIX) 40 MG tablet TAKE ONE TABLET BY MOUTH EVERY DAY BEFORE SUPPER  30 tablet  4  . simvastatin  (ZOCOR) 40 MG tablet Take 40 mg by mouth every morning.       . sodium chloride (MURO 128) 5 % ophthalmic solution 1 drop as needed.       No current facility-administered medications for this visit.    Past Medical History  Diagnosis Date  . Fuchs' corneal dystrophy   . HTN (hypertension)   . High cholesterol   . Hx of mitral valve prolapse 1980  . GERD (gastroesophageal reflux disease)   . Arthritis   . Anxiety   . Breast cancer 02/22/2012    right T1c, N0    Past Surgical History  Procedure Laterality Date  . Carpal tunnel r/l  1992  . Ray  cage fusion  1997  . A cervical spine fusion x2 since last visit    . Elbow surgery   tennis elbow release 2012  . Abdominal hysterectomy  1999  . Sentinel lymph node biopsy  10/30/2002    right  . Ovarian cyst removal      prior to hysterectomy, right  . Breast lumpectomy  10/30/2002    right  . Esophagogastroduodenoscopy  10/26/2012    Procedure: ESOPHAGOGASTRODUODENOSCOPY (EGD);  Surgeon: Louis Meckel, MD;  Location: [facility] ENDOSCOPY;  Service: Endoscopy;  Laterality: N/A;    ZOX:[id number] of systems complete and found to be negative unless listed above  PHYSICAL EXAM BP 120/80  Pulse 118  Wt 176 lb (79.833 kg)  BMI 30.2 kg/m2 General: Well developed, well nourished, in no acute distress Head: Eyes PERRLA, No xanthomas.   Normal cephalic and atramatic  Lungs: Clear bilaterally to auscultation and percussion. Heart: HRRR S1 S2, tachycardic.without MRG.  Pulses are 2+ & equal.            No carotid bruit. No JVD.  No abdominal bruits. No femoral bruits. Abdomen: Bowel sounds are positive, abdomen soft and non-tender without masses or                  Hernia's noted. Msk:  Back normal, normal gait. Normal strength and tone for age. Extremities: No clubbing, cyanosis or edema.  DP +1 Neuro: Alert and oriented X 3. Psych:  Good affect, responds appropriately    ASSESSMENT AND PLAN

## 2013-05-18 ENCOUNTER — Ambulatory Visit (INDEPENDENT_AMBULATORY_CARE_PROVIDER_SITE_OTHER): Payer: Managed Care, Other (non HMO) | Admitting: Cardiology

## 2013-05-18 ENCOUNTER — Encounter: Payer: Self-pay | Admitting: Cardiology

## 2013-05-18 VITALS — BP 126/84 | HR 110 | Wt 178.1 lb

## 2013-05-18 DIAGNOSIS — I059 Rheumatic mitral valve disease, unspecified: Secondary | ICD-10-CM

## 2013-05-18 DIAGNOSIS — I10 Essential (primary) hypertension: Secondary | ICD-10-CM

## 2013-05-18 DIAGNOSIS — R002 Palpitations: Secondary | ICD-10-CM

## 2013-05-18 DIAGNOSIS — R0789 Other chest pain: Secondary | ICD-10-CM

## 2013-05-18 DIAGNOSIS — I341 Nonrheumatic mitral (valve) prolapse: Secondary | ICD-10-CM

## 2013-05-18 MED ORDER — METOPROLOL SUCCINATE ER 25 MG PO TB24: 25.0000 mg | ORAL_TABLET | Freq: Every day | ORAL | Status: DC

## 2013-05-18 NOTE — Assessment & Plan Note (Signed)
Associated with mild mitral regurgitation. 

## 2013-05-18 NOTE — Assessment & Plan Note (Signed)
Reassuring cardiac catheterization as outlined above. She does have mitral valve prolapse which could be related, also describes palpitations. She has noted some improvement on calcium channel blocker and this will be continued. Also reports some responsiveness to nitroglycerin. Switching to long-acting beta blocker in the evenings.

## 2013-05-18 NOTE — Progress Notes (Signed)
Clinical Summary [name] is a 52 y.o.female presenting to the office for a followup visit. I reviewed the records and have never seen her in the office. She has been following with Ms. Lawrence NP since hospital discharge from [facility] in November 2013. She had been evaluated by Dr. Eden Sutton and referred for a cardiac catheterization a demonstrated minor nonobstructive coronary atherosclerosis in the mid LAD.  She has had for evaluation for recurrent episodes of atypical chest pain, also has been experiencing nocturnal palpitations. She states that she does not feel rested in the mornings. She was seen by Dr. Arlyce Sutton for GI evaluation, at which time she was placed on a calcium channel blocker given the possibility of esophageal spasm. Coronary vasospasm has been discussed but never specifically observed.  Echocardiogram from November 2013 demonstrated mild LVH with mild focal basal septal hypertrophy, LVEF 50-55%, posterior mitral prolapse with mild mitral regurgitation.  Recent lab work from April noted including potassium 4.0, BUN 16, creatinine 0.8.  She was placed on Lopressor by Ms. Lawrence NP, states she has only been able tolerate taking the evening dose since it "makes her sleepy."   Allergies  Allergen Reactions  . Bee Venom Anaphylaxis  . Haloperidol Lactate Other (See Comments)    Convulsions   . Meperidine Hcl Hives    Current Outpatient Prescriptions  Medication Sig Dispense Refill  . acetaminophen (TYLENOL) 500 MG tablet Take 1,000 mg by mouth daily.      Marland Kitchen aspirin EC 81 MG EC tablet Take 1 tablet (81 mg total) by mouth daily.      . Calcium-Magnesium-Vitamin D (CALCIUM 500 PO) Take 1 tablet by mouth 2 (two) times daily.      . clonazePAM (KLONOPIN) 1 MG tablet 1 mg 3 (three) times daily as needed. For anxiety      . EPINEPHrine (EPI-PEN) 0.3 mg/0.3 mL DEVI Inject 0.3 mg into the muscle as needed. For bee venom      . fish oil-omega-3 fatty acids 1000 MG capsule Take 2  g by mouth every morning.      . hydrochlorothiazide (MICROZIDE) 12.5 MG capsule Take 1 capsule (12.5 mg total) by mouth daily.  90 capsule  1  . metoprolol tartrate (LOPRESSOR) 25 MG tablet Take 12.5 mg by mouth daily.      . Multiple Vitamin (MULITIVITAMIN WITH MINERALS) TABS Take 1 tablet by mouth every morning.      . naproxen sodium (ANAPROX) 220 MG tablet Take 220 mg by mouth as directed. 1-2 TABLETS DAILY      . NIFEdipine (PROCARDIA XL/ADALAT-CC) 90 MG 24 hr tablet Take 1 tablet (90 mg total) by mouth daily.  30 tablet  6  . nitroGLYCERIN (NITROSTAT) 0.4 MG SL tablet Place 1 tablet (0.4 mg total) under the tongue every 5 (five) minutes x 3 doses as needed for chest pain.  25 tablet  3  . pantoprazole (PROTONIX) 40 MG tablet TAKE ONE TABLET BY MOUTH EVERY DAY BEFORE SUPPER  30 tablet  4  . simvastatin (ZOCOR) 40 MG tablet Take 40 mg by mouth every morning.       . sodium chloride (MURO 128) 5 % ophthalmic solution 1 drop as needed.      . metoprolol succinate (TOPROL-XL) 25 MG 24 hr tablet Take 1 tablet (25 mg total) by mouth at bedtime.  30 tablet  6   No current facility-administered medications for this visit.    Past Medical History  Diagnosis Date  .  Fuchs' corneal dystrophy   . Essential hypertension, benign   . Mixed hyperlipidemia   . Mitral valve prolapse     Mild mitral regurgitation  . GERD (gastroesophageal reflux disease)   . Arthritis   . Anxiety   . Breast cancer 02/22/2012    Right T1c, N0    Social History [name] that she has been smoking Cigarettes.  She has a 8.75 pack-year smoking history. She has never used smokeless tobacco. [name] that  drinks alcohol.  Review of Systems As outlined above. Otherwise negative.  Physical Examination Filed Vitals:   05/18/13 1448  BP: 126/84  Pulse: 110   Filed Weights   05/18/13 1448  Weight: 178 lb 1.3 oz (80.777 kg)   Comfortable at rest. HEENT: Conjunctiva and lids normal, oropharynx  clear Neck: Supple, no elevated JVP or carotid bruits, no thyromegaly. Lungs: Clear to auscultation, nonlabored breathing at rest. Cardiac: Regular rate and rhythm, no S3 or significant systolic murmur, no pericardial rub. Abdomen: Soft, nontender, bowel sounds present. Extremities: No pitting edema, distal pulses 2+. Skin: Warm and dry. Musculoskeletal: No kyphosis. Neuropsychiatric: Alert and oriented x3, affect grossly appropriate.   Problem List and Plan   Palpitations Etiology not entirely clear. She could have symptomatology related to mitral valve prolapse, has never had cardiac monitoring. A 48-hour Holter will be obtained to exclude any arrhythmias. In light of her description of feeling not well rested in the mornings with frequent waking up episodes, may need workup obstructive sleep apnea. We will stop Lopressor and change to Toprol-XL 25 mg in the evening. Office followup arranged.  Atypical chest pain Reassuring cardiac catheterization as outlined above. She does have mitral valve prolapse which could be related, also describes palpitations. She has noted some improvement on calcium channel blocker and this will be continued. Also reports some responsiveness to nitroglycerin. Switching to long-acting beta blocker in the evenings.  HTN (hypertension) Blood pressure control is good today.  Mitral valve prolapse, nonrheumatic Associated with mild mitral regurgitation.    Jonelle Sidle, M.D., F.A.C.C.

## 2013-05-18 NOTE — Patient Instructions (Addendum)
Your physician recommends that you schedule a follow-up appointment in: 2 months  Your physician has recommended that you wear a holter monitor. Holter monitors are medical devices that record the heart's electrical activity. Doctors most often use these monitors to diagnose arrhythmias. Arrhythmias are problems with the speed or rhythm of the heartbeat. The monitor is a small, portable device. You can wear one while you do your normal daily activities. This is usually used to diagnose what is causing palpitations/syncope (passing out).WE WILL CALL YOU RESULTS/NEXT STEPS  Your physician has recommended you make the following change in your medication:   1) STOP TAKING LOPRESSOR 2) START TAKING TOPROL XL 25MG  EVERY NIGHT AT BEDTIME

## 2013-05-18 NOTE — Assessment & Plan Note (Signed)
Etiology not entirely clear. She could have symptomatology related to mitral valve prolapse, has never had cardiac monitoring. A 48-hour Holter will be obtained to exclude any arrhythmias. In light of her description of feeling not well rested in the mornings with frequent waking up episodes, may need workup obstructive sleep apnea. We will stop Lopressor and change to Toprol-XL 25 mg in the evening. Office followup arranged.

## 2013-05-18 NOTE — Assessment & Plan Note (Signed)
Blood pressure control is good today. 

## 2013-05-21 ENCOUNTER — Ambulatory Visit (HOSPITAL_COMMUNITY)
Admission: RE | Admit: 2013-05-21 | Discharge: 2013-05-21 | Disposition: A | Discharge status: Home / Self Care | Payer: Managed Care, Other (non HMO) | Source: Ambulatory Visit | Attending: Cardiology | Admitting: Cardiology

## 2013-05-21 DIAGNOSIS — R002 Palpitations: Secondary | ICD-10-CM | POA: Insufficient documentation

## 2013-05-21 NOTE — Progress Notes (Signed)
*  PRELIMINARY RESULTS* Echocardiogram 48H Holter monitor has been performed.  Conrad [place] 05/21/2013, 8:44 AM

## 2013-05-28 NOTE — Procedures (Signed)
NAMELEONTINE, Rachel Sutton                ACCOUNT NO.:  [id number]  MEDICAL RECORD NO.:  [id number]  LOCATION:  CARDIOPU                      FACILITY:  [facility]  PHYSICIAN:  Gerrit Friends. Dietrich Pates, MD, FACCDATE OF BIRTH:  12-Apr-1961  DATE OF PROCEDURE:  05/27/2013 DATE OF DISCHARGE:  05/21/2013                               HOLTER MONITOR   CLINICAL DATA:  A 52 year old woman with palpitations. 1. Continuous electrocardiographic recording was maintained for 48     hours during which the predominant rhythm was normal sinus with     borderline first-degree AV block.  Average heart rate was reported     as 90 bpm; however, there were multiple recordings with miscounting     of QRSs and a factitiously low estimate of heart rate. 2. There was a minimal occurrence of PVCs with a total of 2 recorded     over 48 hours.  Similarly, premature supraventricular     depolarizations were quite rare, occurring at an average rate of 5     per hour. 3. No significant ST-segment elevation or depression was identified. 4. An extensive and detailed diary of activity was returned with     approximately 20 symptomatic spells reported.  These included     palpitations occurring at rest, with exertion and at night, brief     episodes of chest discomfort, dyspnea, and dizziness.  During all     of these symptomatic intervals, no significant arrhythmias were     identified.  Neither heart rate nor the occasional occurrence of     PACs appeared to be associated with the patient's reported     symptoms.     Gerrit Friends. Dietrich Pates, MD, [facility]     RMR/MEDQ  D:  05/27/2013  T:  05/28/2013  Job:  [id number]

## 2013-05-29 ENCOUNTER — Ambulatory Visit: Payer: Managed Care, Other (non HMO) | Admitting: Oncology

## 2013-05-29 ENCOUNTER — Other Ambulatory Visit: Payer: Managed Care, Other (non HMO) | Admitting: Lab

## 2013-06-01 ENCOUNTER — Ambulatory Visit: Payer: Managed Care, Other (non HMO) | Admitting: Family

## 2013-06-01 ENCOUNTER — Encounter: Payer: Self-pay | Admitting: *Deleted

## 2013-06-01 ENCOUNTER — Telehealth: Payer: Self-pay | Admitting: *Deleted

## 2013-06-01 ENCOUNTER — Ambulatory Visit (HOSPITAL_BASED_OUTPATIENT_CLINIC_OR_DEPARTMENT_OTHER): Payer: Managed Care, Other (non HMO) | Admitting: Oncology

## 2013-06-01 ENCOUNTER — Other Ambulatory Visit (HOSPITAL_BASED_OUTPATIENT_CLINIC_OR_DEPARTMENT_OTHER): Payer: Managed Care, Other (non HMO) | Admitting: Lab

## 2013-06-01 VITALS — BP 122/77 | HR 97 | Temp 98.9°F | Resp 20 | Ht 64.0 in | Wt 176.7 lb

## 2013-06-01 DIAGNOSIS — C50911 Malignant neoplasm of unspecified site of right female breast: Secondary | ICD-10-CM

## 2013-06-01 DIAGNOSIS — C50219 Malignant neoplasm of upper-inner quadrant of unspecified female breast: Secondary | ICD-10-CM

## 2013-06-01 DIAGNOSIS — C50919 Malignant neoplasm of unspecified site of unspecified female breast: Secondary | ICD-10-CM

## 2013-06-01 DIAGNOSIS — Z853 Personal history of malignant neoplasm of breast: Secondary | ICD-10-CM

## 2013-06-01 DIAGNOSIS — M7710 Lateral epicondylitis, unspecified elbow: Secondary | ICD-10-CM

## 2013-06-01 LAB — CBC WITH DIFFERENTIAL/PLATELET
BASO%: 1.1 % (ref 0.0–2.0)
Basophils Absolute: 0.1 10*3/uL (ref 0.0–0.1)
EOS%: 3.3 % (ref 0.0–7.0)
Eosinophils Absolute: 0.4 10*3/uL (ref 0.0–0.5)
HCT: 41.6 % (ref 34.8–46.6)
HGB: 14.2 g/dL (ref 11.6–15.9)
LYMPH%: 43.1 % (ref 14.0–49.7)
MCH: 30.7 pg (ref 25.1–34.0)
MCHC: 34.1 g/dL (ref 31.5–36.0)
MCV: 90.1 fL (ref 79.5–101.0)
MONO#: 0.9 10*3/uL (ref 0.1–0.9)
MONO%: 7.8 % (ref 0.0–14.0)
NEUT#: 5.2 10*3/uL (ref 1.5–6.5)
NEUT%: 44.7 % (ref 38.4–76.8)
Platelets: 331 10*3/uL (ref 145–400)
RBC: 4.61 10*6/uL (ref 3.70–5.45)
RDW: 12.9 % (ref 11.2–14.5)
WBC: 11.5 10*3/uL — ABNORMAL HIGH (ref 3.9–10.3)
lymph#: 5 10*3/uL — ABNORMAL HIGH (ref 0.9–3.3)

## 2013-06-01 LAB — COMPREHENSIVE METABOLIC PANEL (CC13)
ALT: 22 U/L (ref 0–55)
AST: 29 U/L (ref 5–34)
Albumin: 4.2 g/dL (ref 3.5–5.0)
Alkaline Phosphatase: 95 U/L (ref 40–150)
BUN: 20 mg/dL (ref 7.0–26.0)
CO2: 25 mEq/L (ref 22–29)
Calcium: 9.4 mg/dL (ref 8.4–10.4)
Chloride: 103 mEq/L (ref 98–107)
Creatinine: 0.8 mg/dL (ref 0.6–1.1)
Glucose: 102 mg/dl — ABNORMAL HIGH (ref 70–99)
Potassium: 3.9 mEq/L (ref 3.5–5.1)
Sodium: 139 mEq/L (ref 136–145)
Total Bilirubin: 0.39 mg/dL (ref 0.20–1.20)
Total Protein: 7.8 g/dL (ref 6.4–8.3)

## 2013-06-01 NOTE — Telephone Encounter (Signed)
Pt is aware that LM will call her with a genetic appt...td

## 2013-06-01 NOTE — Progress Notes (Signed)
ID: Tiffany Kocher OB: April 14, 1961  MR#: [id number]  JYN#:[id number]  PCP: Fredirick Maudlin, MD GYN:  Olivia Mackie SU:  OTHER MD: Margaretmary Dys, Nona Dell   HISTORY OF PRESENT ILLNESS:  From Dr. Molli Hazard Manning's 11/06/2002 note:  "Ms. Carolin Coy is a 52 year old woman who felt a lump in the upper inner quadrant of the right breast.  Previous mammograms from age 49 on had shown no abnormality.  A mammogram due to the new lump was performed on October 31st of bilateral breasts, and these showed a 1 cm spiculated nodule in the upper inner quadrant of the right breast.  Ultrasound confirmed the presence of a solid lesion in this area, measuring 1.3 cm.  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  Receptor studies on that specimen revealed the tumor to be ER/PR negative, HER-2 positive, and exhibit a high proliferation rate of 47%.    On November 6th, the patient subsequently underwent a right breast MRI which confirmed the presence of a solitary 1.5 cm bi-lobed enhancing mass in the right breast without any multicentric disease.  She subsequently underwent lumpectomy with sentinel lymph node dissection on November 14th, yielding a 1.3 cm Grade III invasive ductal carcinoma.  The tumor approached to within 5 mm of the closest margin.  One sentinel lymph node was obtained that was free of disease, both histologically and immunochemically."  Her subsequent history is as detailed below.   INTERVAL HISTORY: Shunte returns today for followup of her breast cancer, accompanied by Judithann Graves, her research nurse. Cullen is establishing herself in my practice today  REVIEW OF SYSTEMS: Since her last visit here Nateisha has been evaluated by cardiology, with a Holter monitor her earlier this month showing rare PVCs, and no significant ST segment elevation or depression. During monitoring the patient recorded approximately 20 symptomatic spells of  palpitations chest discomfort, dyspnea, and dizziness. These were not related to any electrocardiographic changes recorded. She also underwent a cardiac catheterization which showed minor nonobstructive coronary atherosclerosis in the mid LAD. She has some anxiety and some arthritis, for which she has taken prednisone. Otherwise a detailed review of systems was noncontributory. -- I do not believe any of the symptoms just noted are related to her prior history of breast cancer or its treatment.  PAST MEDICAL HISTORY: Past Medical History  Diagnosis Date  . Fuchs' corneal dystrophy   . Essential hypertension, benign   . Mixed hyperlipidemia   . Mitral valve prolapse     Mild mitral regurgitation  . GERD (gastroesophageal reflux disease)   . Arthritis   . Anxiety   . Breast cancer 02/22/2012    Right T1c, N0    PAST SURGICAL HISTORY: Past Surgical History  Procedure Laterality Date  . Carpal tunnel r/l  1992  . Ray cage fusion  1997  . Cervical spine fusion x2 since last visit    . Elbow surgery   tennis elbow release 2012  . Abdominal hysterectomy  1999  . Sentinel lymph node biopsy  10/30/2002    right  . Ovarian cyst removal      prior to hysterectomy, right  . Breast lumpectomy  10/30/2002    right  . Esophagogastroduodenoscopy  10/26/2012    Procedure: ESOPHAGOGASTRODUODENOSCOPY (EGD);  Surgeon: Louis Meckel, MD;  Location: [facility] ENDOSCOPY;  Service: Endoscopy;  Laterality: N/A;    FAMILY HISTORY Family History  Problem Relation Age of Onset  . Diabetes Mother   . Lung cancer  Father   . Diabetes Father   . Heart disease Father   . Brain cancer Brother   . Lung cancer Maternal Grandfather   . Heart disease Paternal Grandfather   . Anesthesia problems Neg Hx   . Hypotension Neg Hx   . Malignant hyperthermia Neg Hx   . Pseudochol deficiency Neg Hx    the patient's father died from head and neck cancer at the age of 63. The patient's mother died from pancreatic cancer at  the age of 79. The patient has 3 brothers, one of whom has died from a primary central nervous system cancer. She also has 3 sisters. There is no other history of breast or ovarian cancer in the family.  GYNECOLOGIC HISTORY:  Menarche age 44, first live birth age 36, she is GX P2. She stopped menstruating with chemotherapy and periods never resumed. She did not take hormone replacement.  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 The patient has 3 biological grandchildren. Her second husband, Broadus John, is a tumor was brought over. The patient is not a church attender.    ADVANCED DIRECTIVES: Not in place   HEALTH MAINTENANCE: History  Substance Use Topics  . Smoking status: Current Every Day Smoker -- 0.25 packs/day for 35 years    Types: Cigarettes    Last Attempt to Quit: 06/30/2001  . Smokeless tobacco: Never Used  . Alcohol Use: Yes     Comment: Rare     Colonoscopy:  PAP:  Bone density:  Lipid panel:  Allergies  Allergen Reactions  . Bee Venom Anaphylaxis  . Haloperidol Lactate Other (See Comments)    Convulsions   . Meperidine Hcl Hives    Current Outpatient Prescriptions  Medication Sig Dispense Refill  . acetaminophen (TYLENOL) 500 MG tablet Take 1,000 mg by mouth daily.      Marland Kitchen aspirin EC 81 MG EC tablet Take 1 tablet (81 mg total) by mouth daily.      . Calcium-Magnesium-Vitamin D (CALCIUM 500 PO) Take 1 tablet by mouth 2 (two) times daily.      . clonazePAM (KLONOPIN) 1 MG tablet 1 mg 3 (three) times daily as needed. For anxiety      . EPINEPHrine (EPI-PEN) 0.3 mg/0.3 mL DEVI Inject 0.3 mg into the muscle as needed. For bee venom      . fish oil-omega-3 fatty acids 1000 MG capsule Take 2 g by mouth every morning.      . hydrochlorothiazide (MICROZIDE) 12.5 MG capsule Take 1 capsule (12.5 mg total) by mouth daily.  90 capsule  1  . metoprolol succinate  (TOPROL-XL) 25 MG 24 hr tablet Take 1 tablet (25 mg total) by mouth at bedtime.  30 tablet  6  . metoprolol tartrate (LOPRESSOR) 25 MG tablet Take 12.5 mg by mouth daily.      . Multiple Vitamin (MULITIVITAMIN WITH MINERALS) TABS Take 1 tablet by mouth every morning.      . naproxen sodium (ANAPROX) 220 MG tablet Take 220 mg by mouth as directed. 1-2 TABLETS DAILY      . NIFEdipine (PROCARDIA XL/ADALAT-CC) 90 MG 24 hr tablet Take 1 tablet (90 mg total) by mouth daily.  30 tablet  6  . nitroGLYCERIN (NITROSTAT) 0.4 MG SL tablet Place 1 tablet (0.4 mg total) under the tongue every 5 (five) minutes x 3 doses as needed for chest  pain.  25 tablet  3  . pantoprazole (PROTONIX) 40 MG tablet TAKE ONE TABLET BY MOUTH EVERY DAY BEFORE SUPPER  30 tablet  4  . simvastatin (ZOCOR) 40 MG tablet Take 40 mg by mouth every morning.       . sodium chloride (MURO 128) 5 % ophthalmic solution 1 drop as needed.       No current facility-administered medications for this visit.    OBJECTIVE: Middle-aged white woman who appears well Filed Vitals:   06/01/13 1604  BP: 122/77  Pulse: 97  Temp: 98.9 F (37.2 C)  Resp: 20     Body mass index is 30.32 kg/(m^2).    ECOG FS: 0 / Karnofsky 100%  Sclerae unicteric Oropharynx clear No cervical or supraclavicular adenopathy Lungs no rales or rhonchi Heart regular rate and rhythm, no murmur appreciated Abd benign MSK no focal spinal tenderness, no peripheral edema Neuro: non-focal, well-oriented, friendly affect Breasts: The right breast is unremarkable. The left breast is status post lumpectomy and radiation. There is no evidence of local recurrence. The left axilla is benign.   LAB RESULTS:  CMP     Component Value Date/Time   NA 139 06/01/2013 1509   NA 139 03/24/2013 1525   K 3.9 06/01/2013 1509   K 4.0 03/24/2013 1525   CL 103 06/01/2013 1509   CL 101 03/24/2013 1525   CO2 25 06/01/2013 1509   CO2 28 03/24/2013 1525   GLUCOSE 102* 06/01/2013 1509   GLUCOSE 97  03/24/2013 1525   BUN 20.0 06/01/2013 1509   BUN 16 03/24/2013 1525   CREATININE 0.8 06/01/2013 1509   CREATININE 0.83 03/24/2013 1525   CREATININE 0.73 10/23/2012 0506   CALCIUM 9.4 06/01/2013 1509   CALCIUM 10.0 03/24/2013 1525   PROT 7.8 06/01/2013 1509   PROT 6.4 10/25/2012 0520   ALBUMIN 4.2 06/01/2013 1509   ALBUMIN 3.3* 10/25/2012 0520   AST 29 06/01/2013 1509   AST 21 10/25/2012 0520   ALT 22 06/01/2013 1509   ALT 19 10/25/2012 0520   ALKPHOS 95 06/01/2013 1509   ALKPHOS 96 10/25/2012 0520   BILITOT 0.39 06/01/2013 1509   BILITOT 0.4 10/25/2012 0520   GFRNONAA >90 10/23/2012 0506   GFRAA >90 10/23/2012 0506    I No results found for this basename: SPEP, UPEP,  kappa and lambda light chains    Lab Results  Component Value Date   WBC 11.5* 06/01/2013   NEUTROABS 5.2 06/01/2013   HGB 14.2 06/01/2013   HCT 41.6 06/01/2013   MCV 90.1 06/01/2013   PLT 331 06/01/2013      Chemistry      Component Value Date/Time   NA 139 06/01/2013 1509   NA 139 03/24/2013 1525   K 3.9 06/01/2013 1509   K 4.0 03/24/2013 1525   CL 103 06/01/2013 1509   CL 101 03/24/2013 1525   CO2 25 06/01/2013 1509   CO2 28 03/24/2013 1525   BUN 20.0 06/01/2013 1509   BUN 16 03/24/2013 1525   CREATININE 0.8 06/01/2013 1509   CREATININE 0.83 03/24/2013 1525   CREATININE 0.73 10/23/2012 0506      Component Value Date/Time   CALCIUM 9.4 06/01/2013 1509   CALCIUM 10.0 03/24/2013 1525   ALKPHOS 95 06/01/2013 1509   ALKPHOS 96 10/25/2012 0520   AST 29 06/01/2013 1509   AST 21 10/25/2012 0520   ALT 22 06/01/2013 1509   ALT 19 10/25/2012 0520   BILITOT 0.39 06/01/2013 1509  BILITOT 0.4 10/25/2012 0520       Lab Results  Component Value Date   LABCA2 31 05/27/2012    No components found with this basename: [id number]    No results found for this basename: INR,  in the last 168 hours  Urinalysis    Component Value Date/Time   COLORURINE YELLOW 10/22/2012 2010   APPEARANCEUR CLEAR 10/22/2012 2010   LABSPEC 1.025 10/22/2012 2010   PHURINE  6.0 10/22/2012 2010   GLUCOSEU NEGATIVE 10/22/2012 2010   HGBUR NEGATIVE 10/22/2012 2010   BILIRUBINUR NEGATIVE 10/22/2012 2010   KETONESUR NEGATIVE 10/22/2012 2010   PROTEINUR NEGATIVE 10/22/2012 2010   UROBILINOGEN 0.2 10/22/2012 2010   NITRITE NEGATIVE 10/22/2012 2010   LEUKOCYTESUR NEGATIVE 10/22/2012 2010    STUDIES: Mammography 04/02/2013 was unremarkable   ASSESSMENT: 52 y.o. [place] woman Status post left lumpectomy and sentinel lymph node sampling 10/30/2002 for a pT1c pn0, stage IA invasive ductal carcinoma, grade 3, estrogen and progesterone receptor negative, HER-2 amplified   (1) enrolled in [facility]G 006, herceptin arm; received  (a) doxorubicin and cyclophosphamide x4, then  (b) docetaxel and trastuzumab x4, completed 05/07/2003  (2) adjuvant radiation to 50.4 gray with an additional 10 gray boost to the surgical cavity completed 07/19/2003  (3) trastuzumab continued until 03/03/2004  (4) genetics consultation pending   PLAN: Lulubelle is established herself in my practice today, and she is also being released from my practice today, after a full review of her situation. As noted above, she was treated with standard chemotherapy and radiation, as well as standard anti-HER-2 treatment. There has been no evidence of disease recurrence. Generally I follow estrogen receptor negative patients for 5 years. In your case of followup continued for total of 10 years as part of the DCIS the study.  I am comfortable releasing Nicloe back to her primary care physician's care. However I am concerned that she was just over 40 at the time of her initial diagnosis. This generally calls for genetic testing and this was discussed with the patient today. An appointment with our genetics counselor is pending. If the results are positive I will discuss them in detail with [facility].  Otherwise no further appointments have been made for her here. Nevertheless she knows we will be glad to see her again as  needed if a future problem arises.  Lowella Dell, MD   06/01/2013 4:35 PM

## 2013-06-01 NOTE — Progress Notes (Signed)
06/01/2013 Patient in to clinic today for final follow up visit on study. Patient is fully active without restrictions (KPS=100%). Patient has continued to experience episodes of chest pain and palpitations, however, cardiac workup has not revealed a cardiac abnormality causing these symptoms. She continues to receive treatment for Fuchs corneal dystrophy, and arthritis, for which she recently completed a 6-week course of prednisone (ending approximately 3-4 weeks ago).  Physical exam performed by Dr. Darnelle Catalan. Patient was released to primary care and gynecologic physicians for ongoing follow-up. Thanked patient for her participation in the research study.  [name] BSN, RN, [facility] 06/01/2013 4:42 PM

## 2013-06-02 LAB — VITAMIN D 25 HYDROXY (VIT D DEFICIENCY, FRACTURES): Vit D, 25-Hydroxy: 41 ng/mL (ref 30–89)

## 2013-06-02 NOTE — Progress Notes (Signed)
Care of this BCIRG 006 protocol patient was transferred from Dr. Donnie Coffin to Dr. Darnelle Catalan. [name] BSN, RN, [facility] 06/02/2013 8:42 AM

## 2013-06-03 ENCOUNTER — Telehealth: Payer: Self-pay | Admitting: Oncology

## 2013-06-03 NOTE — Telephone Encounter (Signed)
Letter sent to Dr.Edwards Juanetta Gosling from Dr. Darnelle Catalan

## 2013-06-09 ENCOUNTER — Ambulatory Visit: Payer: Managed Care, Other (non HMO) | Admitting: Oncology

## 2013-06-11 ENCOUNTER — Telehealth: Payer: Self-pay | Admitting: *Deleted

## 2013-06-11 NOTE — Telephone Encounter (Signed)
Confirmed 08/13/13 genetic appt w/ pt.  Mailed calendar to pt.

## 2013-07-29 ENCOUNTER — Encounter: Payer: Self-pay | Admitting: Cardiology

## 2013-07-29 ENCOUNTER — Ambulatory Visit (INDEPENDENT_AMBULATORY_CARE_PROVIDER_SITE_OTHER): Payer: Managed Care, Other (non HMO) | Admitting: Cardiology

## 2013-07-29 VITALS — BP 144/82 | HR 90 | Ht 64.0 in | Wt 177.0 lb

## 2013-07-29 DIAGNOSIS — R002 Palpitations: Secondary | ICD-10-CM

## 2013-07-29 NOTE — Progress Notes (Signed)
Clinical Summary [name] is a 52 y.o.female last seen in June. She reports fewer palpitations in the evenings since being on Toprol-XL. No chest pain symptoms.  Holter monitor reviewed without definitive dysrhythmia to correlate with symptoms. Rare to occasional PVCs and PACs noted. We discussed this.  In review of her cardiac testing so far, plan is to continue with medical therapy and observation. We did discuss increasing her Toprol-XL dose to see if this would help further.   Allergies  Allergen Reactions  . Bee Venom Anaphylaxis  . Haloperidol Lactate Other (See Comments)    Convulsions   . Meperidine Hcl Hives    Current Outpatient Prescriptions  Medication Sig Dispense Refill  . acetaminophen (TYLENOL) 500 MG tablet Take 1,000 mg by mouth daily.      Marland Kitchen aspirin EC 81 MG EC tablet Take 1 tablet (81 mg total) by mouth daily.      . Calcium-Magnesium-Vitamin D (CALCIUM 500 PO) Take 1 tablet by mouth 2 (two) times daily.      . clonazePAM (KLONOPIN) 1 MG tablet 1 mg 3 (three) times daily as needed. For anxiety      . EPINEPHrine (EPI-PEN) 0.3 mg/0.3 mL DEVI Inject 0.3 mg into the muscle as needed. For bee venom      . fish oil-omega-3 fatty acids 1000 MG capsule Take 2 g by mouth every morning.      . hydrochlorothiazide (MICROZIDE) 12.5 MG capsule Take 1 capsule (12.5 mg total) by mouth daily.  90 capsule  1  . metoprolol succinate (TOPROL-XL) 25 MG 24 hr tablet Take 1 tablet (25 mg total) by mouth at bedtime.  30 tablet  6  . Multiple Vitamin (MULITIVITAMIN WITH MINERALS) TABS Take 1 tablet by mouth every morning.      . naproxen sodium (ANAPROX) 220 MG tablet Take 220 mg by mouth as directed. 1-2 TABLETS DAILY      . NIFEdipine (PROCARDIA XL/ADALAT-CC) 90 MG 24 hr tablet Take 1 tablet (90 mg total) by mouth daily.  30 tablet  6  . nitroGLYCERIN (NITROSTAT) 0.4 MG SL tablet Place 1 tablet (0.4 mg total) under the tongue every 5 (five) minutes x 3 doses as needed for chest pain.   25 tablet  3  . pantoprazole (PROTONIX) 40 MG tablet TAKE ONE TABLET BY MOUTH EVERY DAY BEFORE SUPPER  30 tablet  4  . simvastatin (ZOCOR) 40 MG tablet Take 40 mg by mouth every morning.       . sodium chloride (MURO 128) 5 % ophthalmic solution 1 drop as needed.       No current facility-administered medications for this visit.    Past Medical History  Diagnosis Date  . Fuchs' corneal dystrophy   . Essential hypertension, benign   . Mixed hyperlipidemia   . Mitral valve prolapse     Mild mitral regurgitation  . GERD (gastroesophageal reflux disease)   . Arthritis   . Anxiety   . Breast cancer 02/22/2012    Right T1c, N0    Social History [name] that she has been smoking Cigarettes.  She has a 8.75 pack-year smoking history. She has never used smokeless tobacco. [name] that  drinks alcohol.  Review of Systems As outlined above.  Physical Examination Filed Vitals:   07/29/13 1535  BP: 144/82  Pulse: 90   Filed Weights   07/29/13 1510  Weight: 177 lb (80.287 kg)    Comfortable at rest.  HEENT: Conjunctiva and  lids normal, oropharynx clear  Neck: Supple, no elevated JVP or carotid bruits, no thyromegaly.  Lungs: Clear to auscultation, nonlabored breathing at rest.  Cardiac: Regular rate and rhythm, no S3 or significant systolic murmur, no pericardial rub.  Abdomen: Soft, nontender, bowel sounds present.  Extremities: No pitting edema, distal pulses 2+.    Problem List and Plan   Palpitations Seems to be better controlled on beta blocker in the evenings. Increase Toprol-XL to 50 mg in the evening to see if this helps even further. It so a prescription can be provided. Otherwise recommended regular exercise, stress reduction, avoiding caffeine. Followup arranged.    Jonelle Sidle, M.D., F.A.C.C.

## 2013-07-29 NOTE — Patient Instructions (Addendum)
Your physician recommends that you schedule a follow-up appointment in: 6 MONTHS   Your physician has recommended you make the following change in your medication:   1) INCREASE TOPROL TO 50MG  EVERY EVENING, IF THIS WORKS WELL FOR YOU CALL THE OFFICE FOR AN UPDATED REFILL AT [phone]

## 2013-07-29 NOTE — Assessment & Plan Note (Signed)
Seems to be better controlled on beta blocker in the evenings. Increase Toprol-XL to 50 mg in the evening to see if this helps even further. It so a prescription can be provided. Otherwise recommended regular exercise, stress reduction, avoiding caffeine. Followup arranged.

## 2013-08-03 ENCOUNTER — Other Ambulatory Visit: Payer: Self-pay | Admitting: Cardiology

## 2013-08-03 MED ORDER — HYDROCHLOROTHIAZIDE 12.5 MG PO CAPS: 12.5000 mg | ORAL_CAPSULE | Freq: Every day | ORAL | Status: DC

## 2013-08-13 ENCOUNTER — Ambulatory Visit (HOSPITAL_BASED_OUTPATIENT_CLINIC_OR_DEPARTMENT_OTHER): Payer: Managed Care, Other (non HMO) | Admitting: Genetic Counselor

## 2013-08-13 ENCOUNTER — Encounter: Payer: Self-pay | Admitting: Genetic Counselor

## 2013-08-13 ENCOUNTER — Other Ambulatory Visit: Payer: Managed Care, Other (non HMO) | Admitting: Lab

## 2013-08-13 DIAGNOSIS — Z801 Family history of malignant neoplasm of trachea, bronchus and lung: Secondary | ICD-10-CM

## 2013-08-13 DIAGNOSIS — IMO0002 Reserved for concepts with insufficient information to code with codable children: Secondary | ICD-10-CM

## 2013-08-13 DIAGNOSIS — Z853 Personal history of malignant neoplasm of breast: Secondary | ICD-10-CM

## 2013-08-13 DIAGNOSIS — Z808 Family history of malignant neoplasm of other organs or systems: Secondary | ICD-10-CM

## 2013-08-13 DIAGNOSIS — Z803 Family history of malignant neoplasm of breast: Secondary | ICD-10-CM

## 2013-08-13 DIAGNOSIS — C50911 Malignant neoplasm of unspecified site of right female breast: Secondary | ICD-10-CM

## 2013-08-13 NOTE — Progress Notes (Signed)
Dr.  Raymond Gurney Magrinat requested a consultation for genetic counseling and risk assessment for Rachel Sutton, a 52 y.o. female, for discussion of her personal history of breast cancer and family history of brain, pancreatic, esophageal and lung cancer.  She presents to clinic today to discuss the possibility of a genetic predisposition to cancer, and to further clarify her risks, as well as her family members' risks for cancer.   HISTORY OF PRESENT ILLNESS: In October 2003, at the age of 48, Rachel Sutton was diagnosed with triple negative breast cancer of the right breast. She has had a colonoscopy in the past and was found to have one polyp.    Past Medical History  Diagnosis Date  . Fuchs' corneal dystrophy   . Essential hypertension, benign   . Mixed hyperlipidemia   . Mitral valve prolapse     Mild mitral regurgitation  . GERD (gastroesophageal reflux disease)   . Arthritis   . Anxiety   . Breast cancer 02/22/2012    Right T1c, N0    Past Surgical History  Procedure Laterality Date  . Carpal tunnel r/l  1992  . Ray cage fusion  1997  . Cervical spine fusion x2 since last visit    . Elbow surgery   tennis elbow release 2012  . Abdominal hysterectomy  1999  . Sentinel lymph node biopsy  10/30/2002    right  . Ovarian cyst removal      prior to hysterectomy, right  . Breast lumpectomy  10/30/2002    right  . Esophagogastroduodenoscopy  10/26/2012    Procedure: ESOPHAGOGASTRODUODENOSCOPY (EGD);  Surgeon: Louis Meckel, MD;  Location: [facility] ENDOSCOPY;  Service: Endoscopy;  Laterality: N/A;    History   Social History  . Marital Status: Married    Spouse Name: N/A    Number of Children: 2  . Years of Education: N/A   Occupational History  . Accountant     [place]   Social History Main Topics  . Smoking status: Current Every Day Smoker -- 0.25 packs/day for 35 years    Types: Cigarettes    Last Attempt to Quit: 06/30/2001  . Smokeless tobacco: Never Used  .  Alcohol Use: Yes     Comment: Rare  . Drug Use: No  . Sexual Activity: None   Other Topics Concern  . None   Social History Narrative  . None    REPRODUCTIVE HISTORY AND PERSONAL RISK ASSESSMENT FACTORS: Menarche was at age 27.   postmenopausal Uterus Intact: no Ovaries Intact: yes G8P2A6, first live birth at age 48  She has not previously undergone treatment for infertility.   Oral Contraceptive use: 19 years   She has not used HRT in the past.    FAMILY HISTORY:  We obtained a detailed, 4-generation family history.  Significant diagnoses are listed below: Family History  Problem Relation Age of Onset  . Diabetes Mother   . Pancreatic cancer Mother   . Lung cancer Father   . Diabetes Father   . Heart disease Father   . Esophageal cancer Father 32    Beer drinker  . Brain cancer Brother 71  . Brain cancer Maternal Grandfather   . Heart disease Paternal Grandfather   . Anesthesia problems Neg Hx   . Hypotension Neg Hx   . Malignant hyperthermia Neg Hx   . Pseudochol deficiency Neg Hx   . Cancer Maternal Uncle     unknown form of cancer; cigar smoker  .  Lung cancer Maternal Grandmother   . Brain cancer Cousin     dx in his late 29s-40s  The patient's niece was diagnosed with lymphoma at age 4 and died at 21.  Patient's maternal ancestors are of [place] descent, and paternal ancestors are of [place], [place] and [place] descent. There is no reported Ashkenazi Jewish ancestry. There is no known consanguinity.  GENETIC COUNSELING ASSESSMENT: [name] is a 52 y.o. female with a personal history of breast cancer and fmaily history of lymphoma, and lung, pancreatic, brain and esophageal cancer which somewhat suggestive of a hereditary cancer syndrome and predisposition to cancer. We, therefore, discussed and recommended the following at today's visit.   DISCUSSION: We reviewed the characteristics, features and inheritance patterns of hereditary cancer syndromes. We also  discussed genetic testing, including the appropriate family members to test, the process of testing, insurance coverage and turn-around-time for results.   PLAN: After considering the risks, benefits, and limitations, [name] provided informed consent to pursue genetic testing and the blood sample will be sent to [facility] for analysis of the brest/ovarian cancer panel. We discussed the implications of a positive, negative and/ or variant of uncertain significance genetic test result. Results should be available within approximately 3-4 weeks' time, at which point they will be disclosed by telephone to Tiffany Kocher, as will any additional recommendations warranted by these results. [name] will receive a summary of her genetic counseling visit and a copy of her results once available. This information will also be available in Epic. We encouraged [name] to remain in contact with cancer genetics annually so that we can continuously update the family history and inform her of any changes in cancer genetics and testing that may be of benefit for her family. [name]'s questions were answered to her satisfaction today. Our contact information was provided should additional questions or concerns arise.  The patient was seen for a total of 60 minutes, greater than 50% of which was spent face-to-face counseling.  This plan is being carried out per Dr. Feliz Beam recommendations.  This note will also be sent to the referring provider via the electronic medical record. The patient will be supplied with a summary of this genetic counseling discussion as well as educational information on the discussed hereditary cancer syndromes following the conclusion of their visit.   Patient was discussed with Dr. Drue Second.   _______________________________________________________________________ For Office Staff:  Number of people involved in session: 1 Was an Intern/ student  involved with case: no

## 2013-08-18 ENCOUNTER — Telehealth: Payer: Self-pay | Admitting: *Deleted

## 2013-08-18 DIAGNOSIS — R002 Palpitations: Secondary | ICD-10-CM

## 2013-08-18 MED ORDER — METOPROLOL SUCCINATE ER 50 MG PO TB24: 50.0000 mg | ORAL_TABLET | Freq: Every day | ORAL | Status: DC

## 2013-08-18 NOTE — Telephone Encounter (Signed)
Medication sent via escribe.  

## 2013-08-18 NOTE — Telephone Encounter (Signed)
Pt metoprolol was increased to 50mg  and it is working for her so she needs a new RX called in for her to [facility] in [place]

## 2013-08-31 ENCOUNTER — Encounter: Payer: Self-pay | Admitting: Genetic Counselor

## 2013-08-31 ENCOUNTER — Telehealth: Payer: Self-pay | Admitting: Genetic Counselor

## 2013-08-31 ENCOUNTER — Other Ambulatory Visit: Payer: Self-pay | Admitting: *Deleted

## 2013-08-31 MED ORDER — NIFEDIPINE ER OSMOTIC RELEASE 90 MG PO TB24: 90.0000 mg | ORAL_TABLET | Freq: Every day | ORAL | Status: DC

## 2013-08-31 NOTE — Telephone Encounter (Signed)
Revealed negative genetic test results 

## 2013-08-31 NOTE — Telephone Encounter (Signed)
Left good news message on phone at work and cell.  Asked that she please call me back.

## 2013-09-30 IMAGING — CT CT ANGIO CHEST
2 of 6 series · 19 of 46 positions shown · IV contrast (APPLIED)
Comparison: Chest radiograph performed 10/22/2012

CLINICAL DATA: Chest pain.  Shortness of breath.  Status post
cardiac catheterization today.

CT ANGIOGRAPHY CHEST
TECHNIQUE: Multidetector CT imaging of the chest using the
standard protocol during bolus administration of intravenous
contrast. Multiplanar reconstructed images including MIPs were
obtained and reviewed to evaluate the vascular anatomy.
Contrast: 100mL OMNIPAQUE IOHEXOL 350 MG/ML SOLN

[Series 6: pulm embolism 1.0 b25f thin · axial · 0.70mm/px · z∈[+52,+304]mm · 16 of 276 slices shown]
[im 12/276  lung]
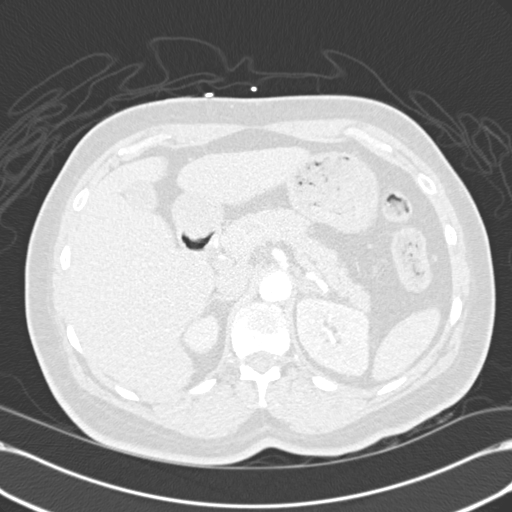
[im 36/276  soft-tissue]
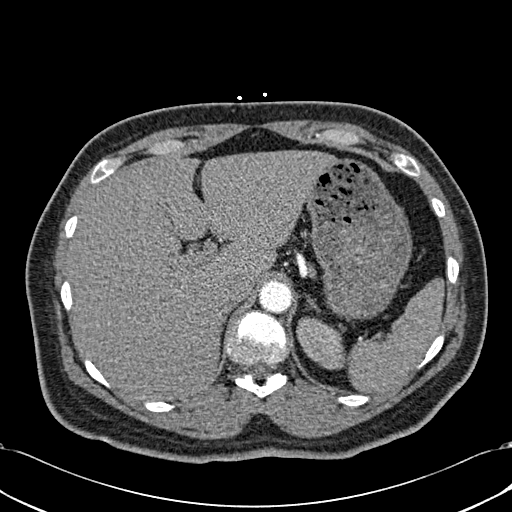
[im 48/276  lung]
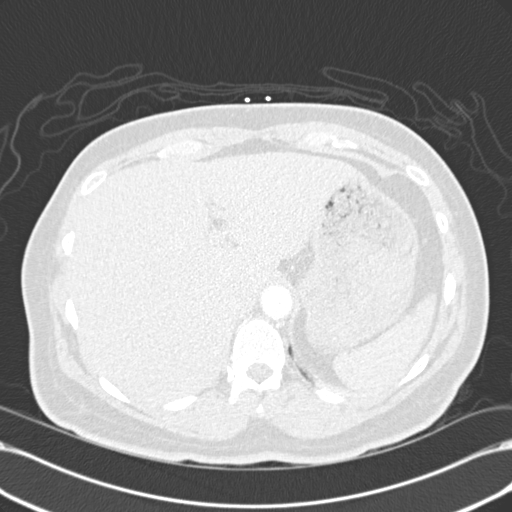
[im 60/276  soft-tissue]
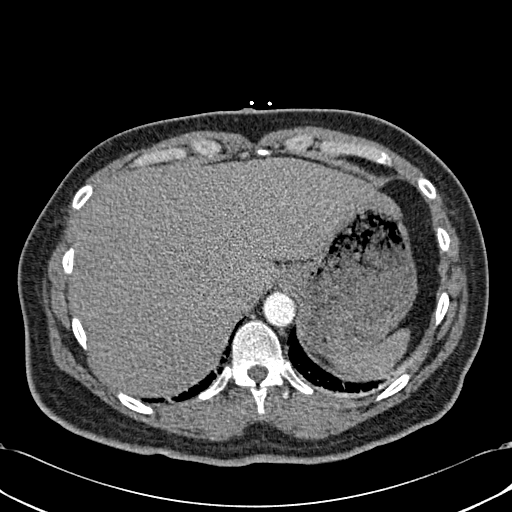
[im 84/276  lung]
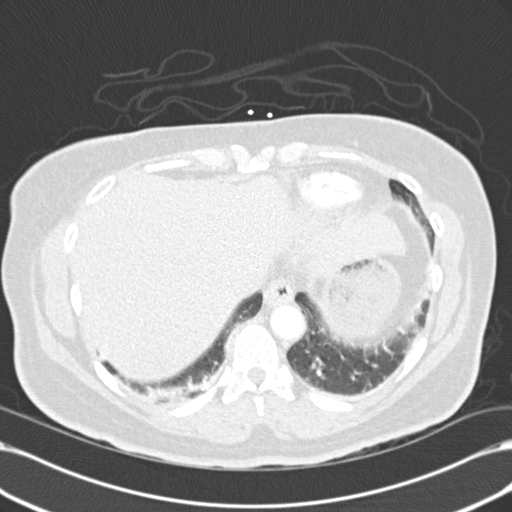
[im 96/276  soft-tissue]
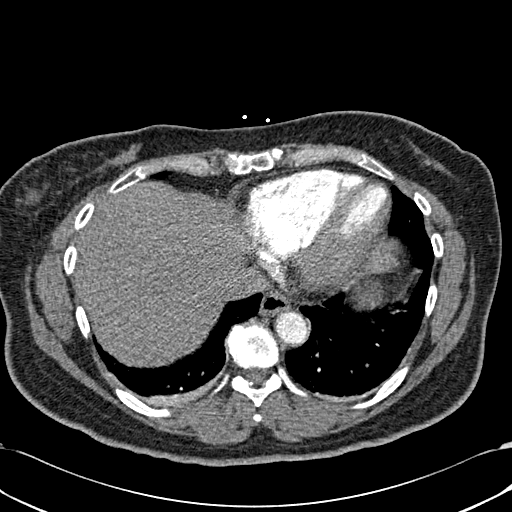
[im 108/276  lung]
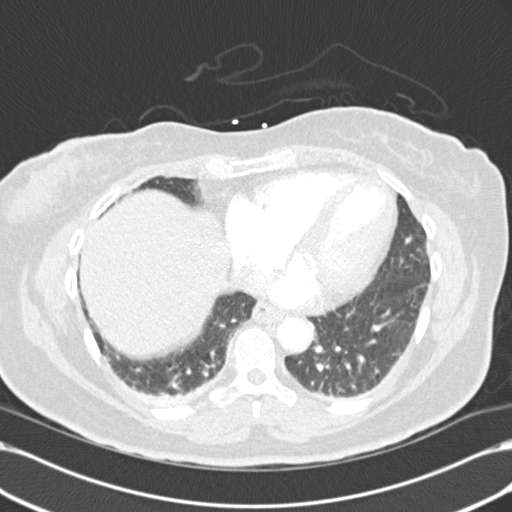
[im 132/276  soft-tissue]
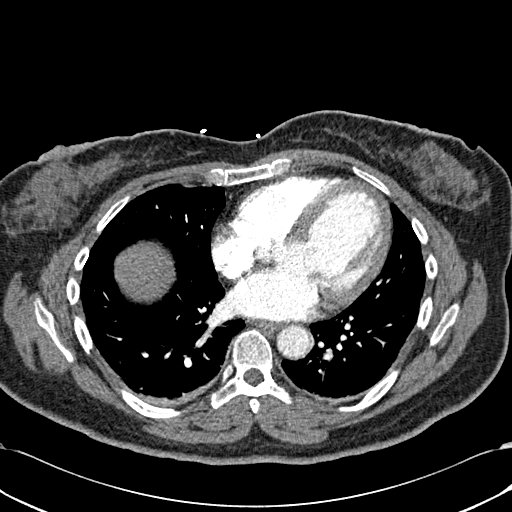
[im 144/276  lung]
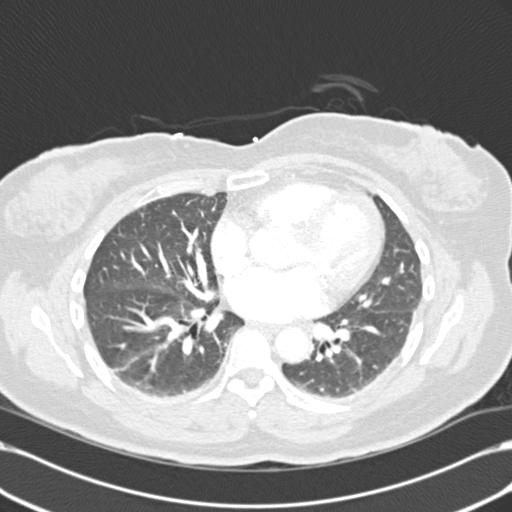
[im 168/276  soft-tissue]
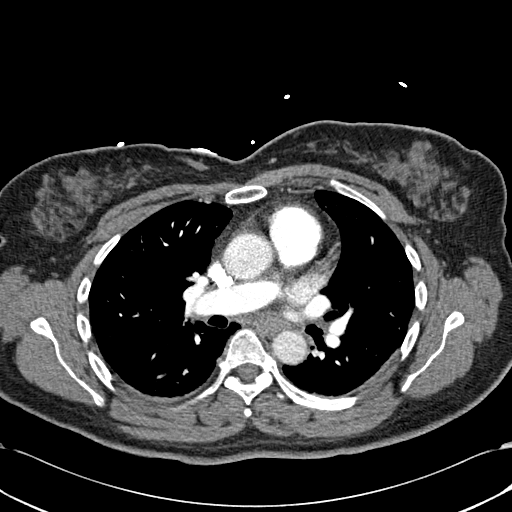
[im 180/276  lung]
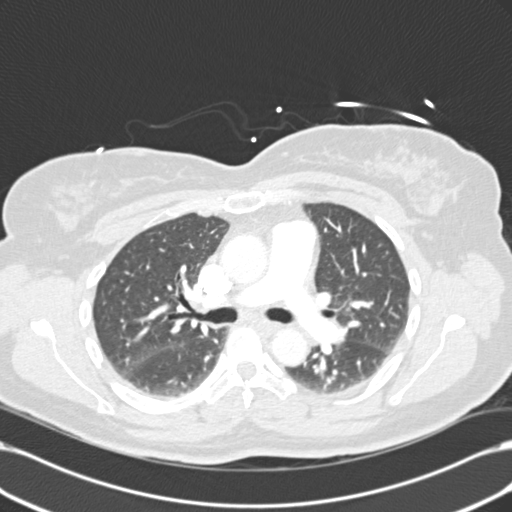
[im 192/276  soft-tissue]
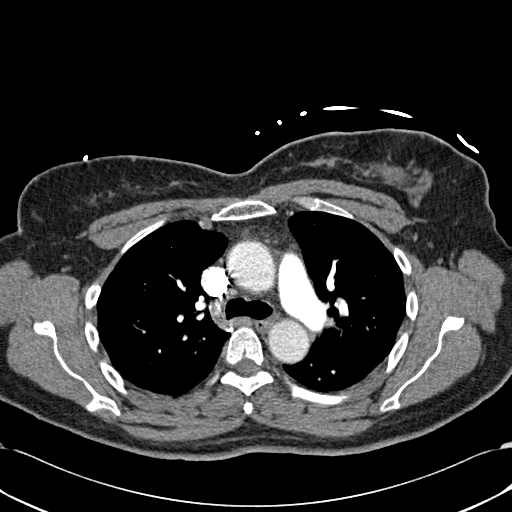
[im 216/276  lung]
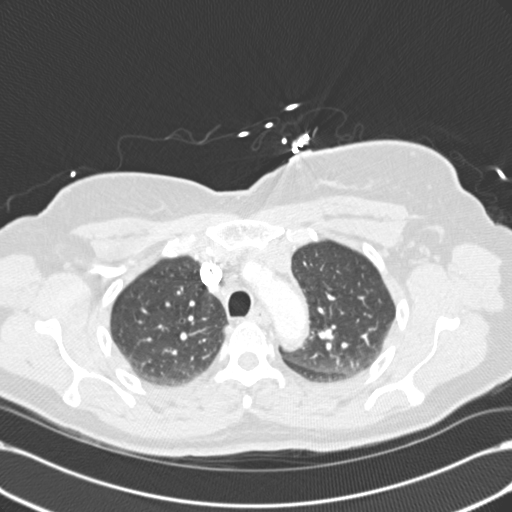
[im 228/276  soft-tissue]
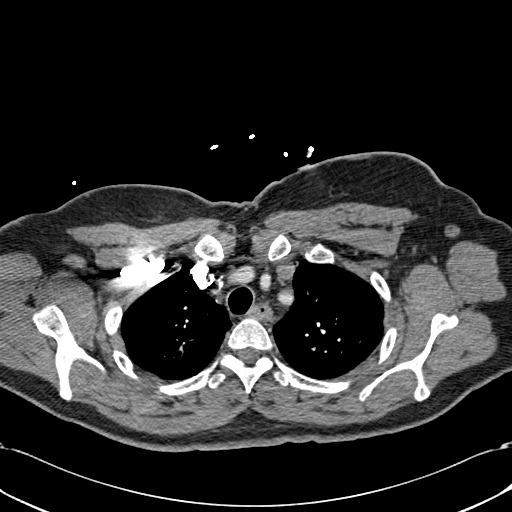
[im 240/276  lung]
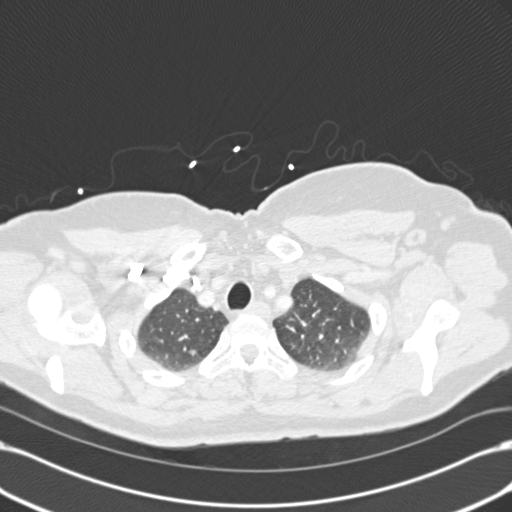
[im 264/276  soft-tissue]
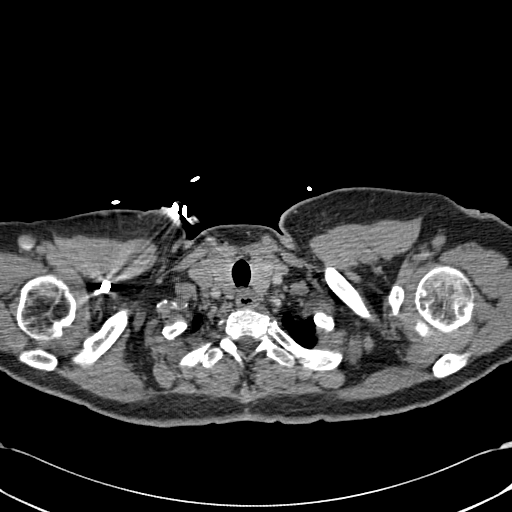

[Series 8: coronals · coronal · 0.70mm/px · 3 of 102 slices shown]
[im 26/102  soft-tissue]
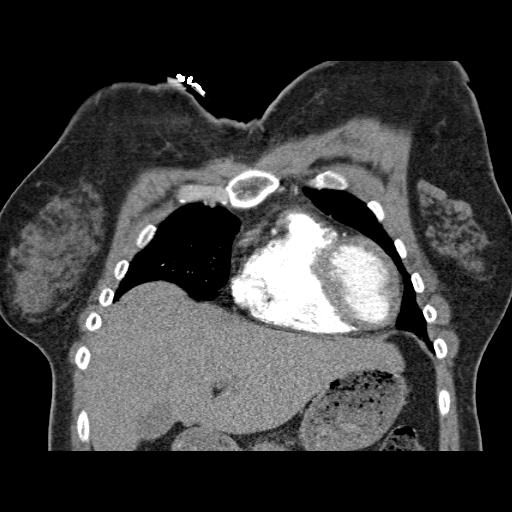
[im 51/102  soft-tissue]
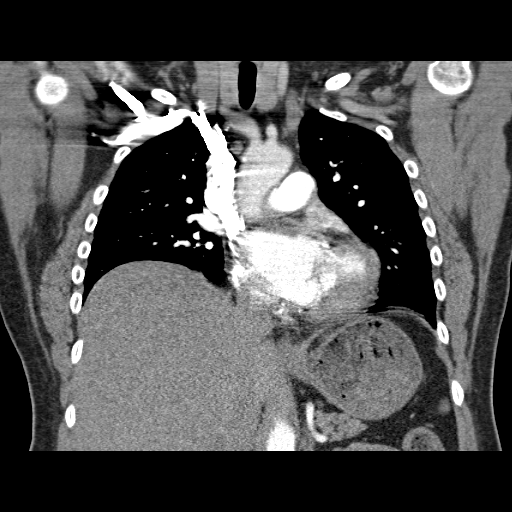
[im 76/102  soft-tissue]
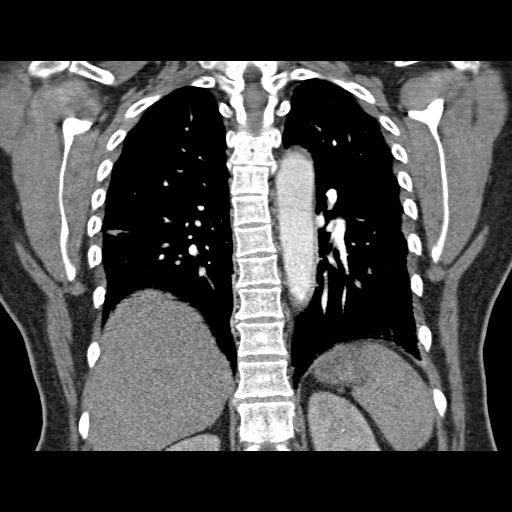

[19 of 46 positions shown; findings below may reference images not displayed]

FINDINGS: There is no evidence of pulmonary embolus.

Mild right basilar atelectasis is noted.  The lungs are otherwise
grossly clear.  There is no evidence of significant focal
consolidation, pleural effusion or pneumothorax.  No masses are
identified; no abnormal focal contrast enhancement is seen.

The mediastinum is unremarkable in appearance.  No mediastinal
lymphadenopathy is seen.  No pericardial effusion is identified.
The great vessels are unremarkable in appearance.  No axillary
lymphadenopathy is seen.  The visualized portions of the thyroid
gland are unremarkable in appearance.

The visualized portions of the liver and spleen are unremarkable.
The visualized portions of the pancreas, gallbladder, stomach,
adrenal glands and kidneys are within normal limits.

No acute osseous abnormalities are seen.
IMPRESSION: 1.  No evidence of pulmonary embolus.
2.  Mild right basilar atelectasis; lungs otherwise clear.

## 2013-10-02 IMAGING — US US ABDOMEN COMPLETE
1 series · 14 of 25 positions shown · non-contrast
Comparison: Report from CT abdomen of 11/18/2002 and report from
abdomen ultrasound 11/10/2002

CLINICAL DATA: Atypical chest pain

COMPLETE ABDOMINAL ULTRASOUND

[Series 1: us abdomen complete · 0.28mm/px · 14 of 110 slices shown]
[im 1/110]
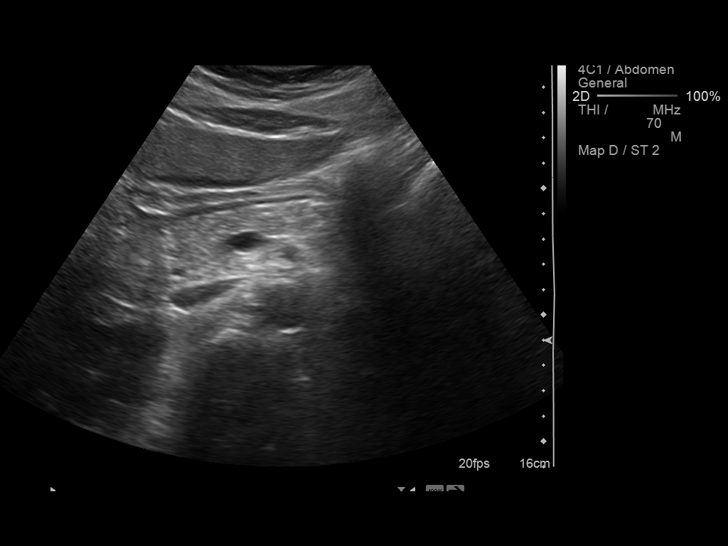
[im 10/110]
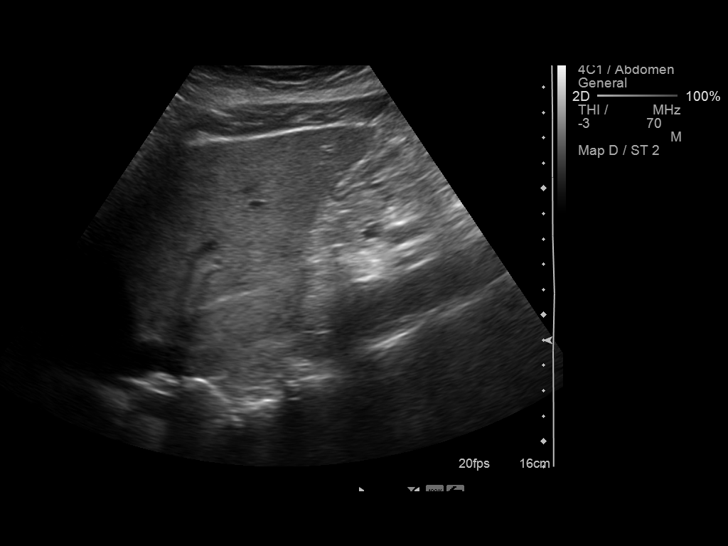
[im 19/110]
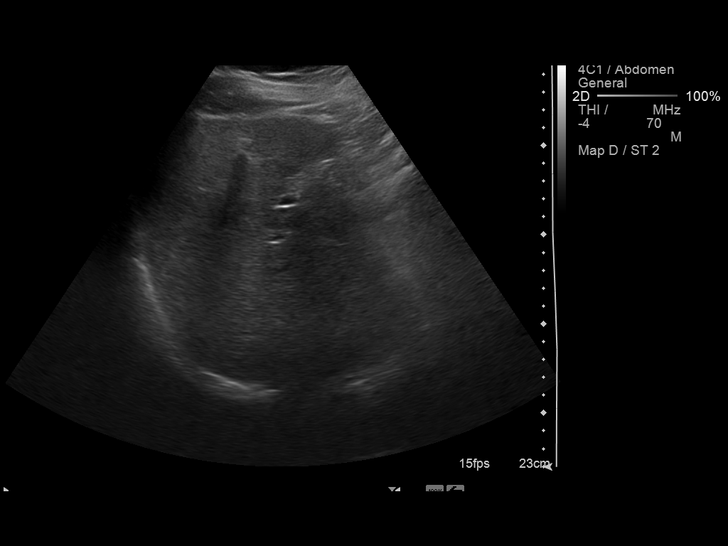
[im 28/110]
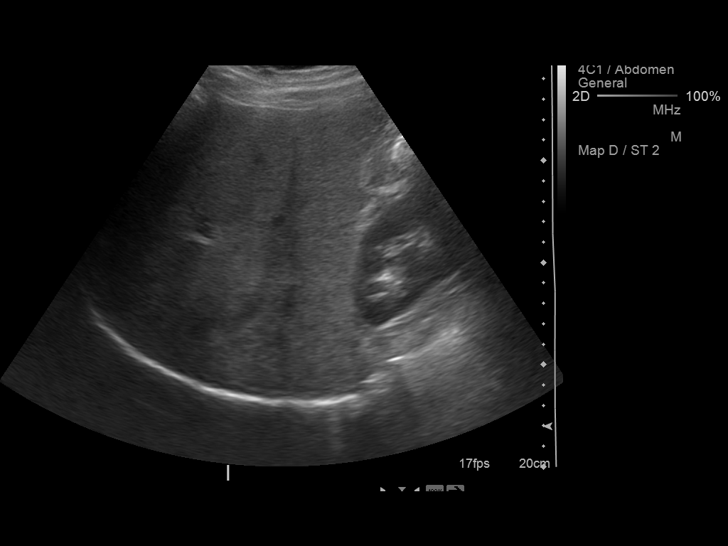
[im 37/110]
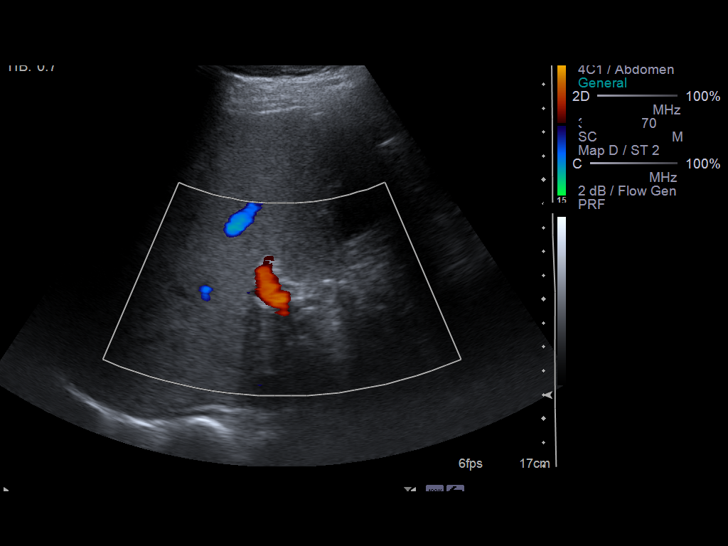
[im 41/110]
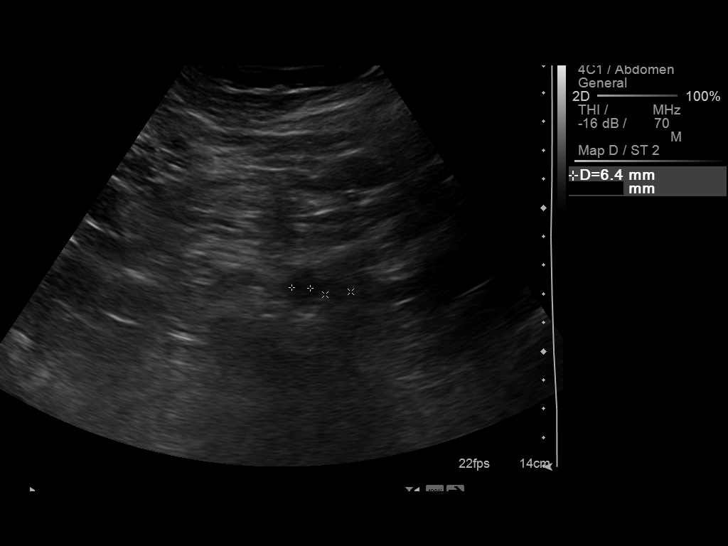
[im 50/110]
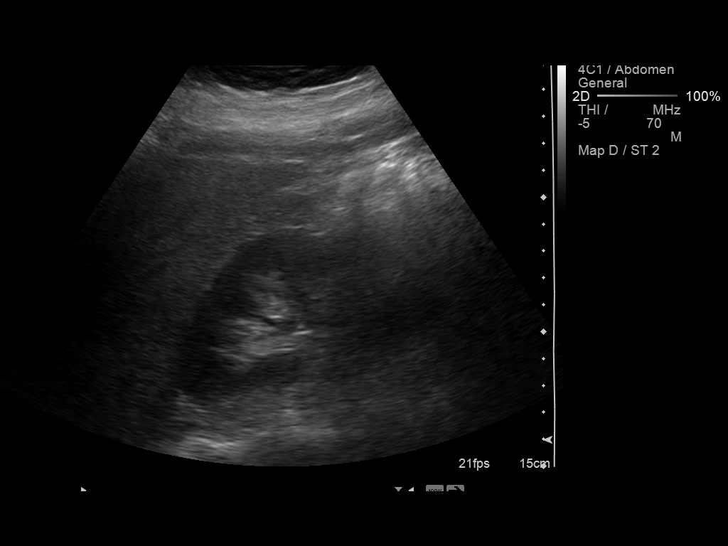
[im 60/110]
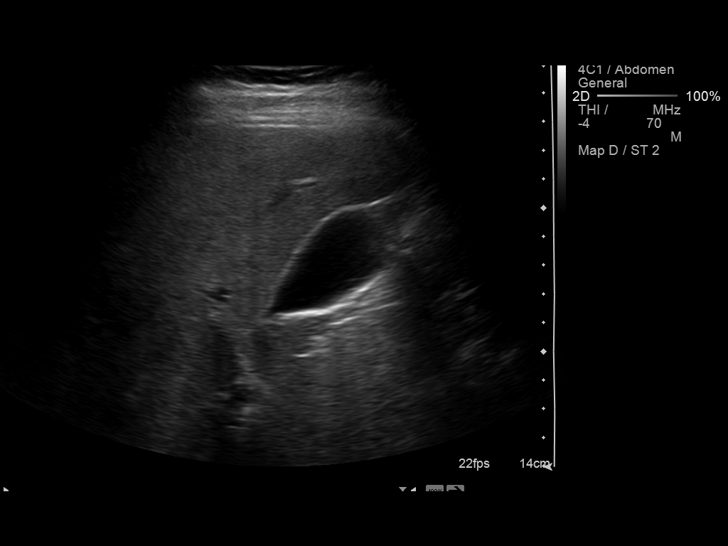
[im 69/110]
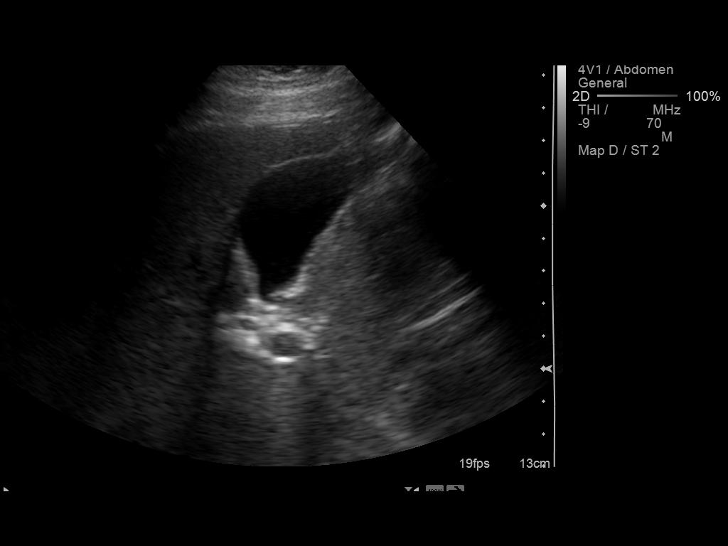
[im 73/110]
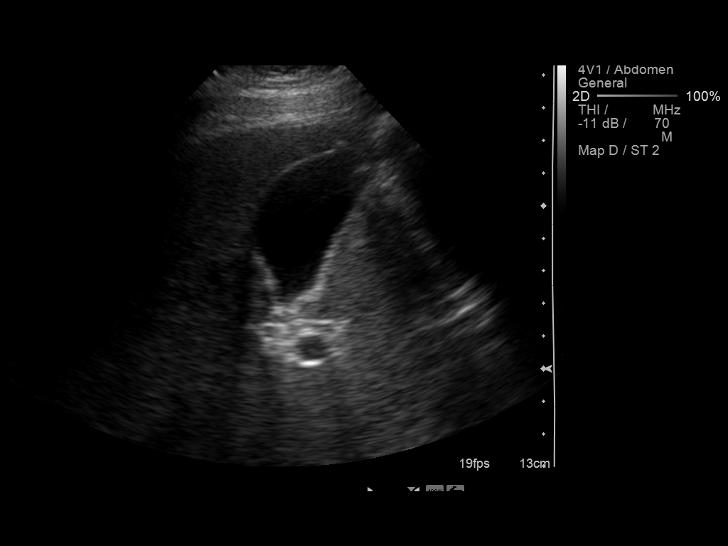
[im 82/110]
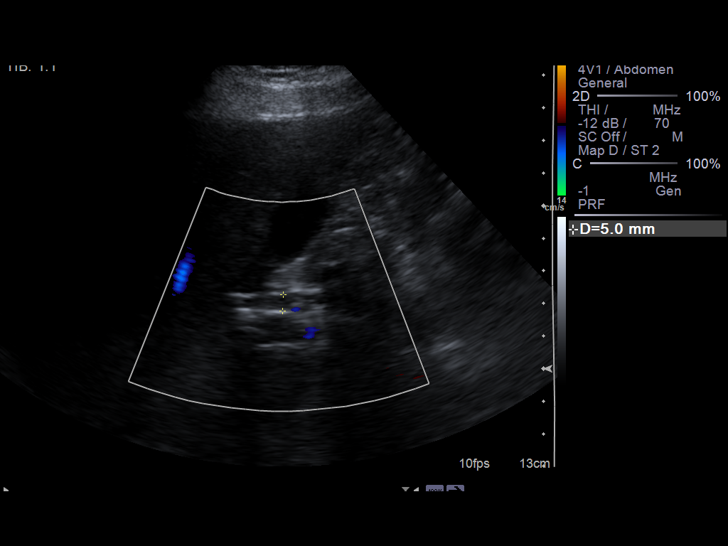
[im 91/110]
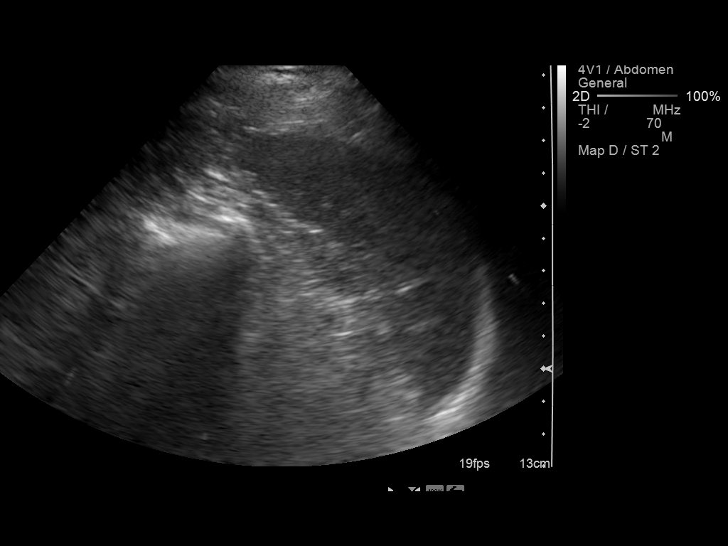
[im 100/110]
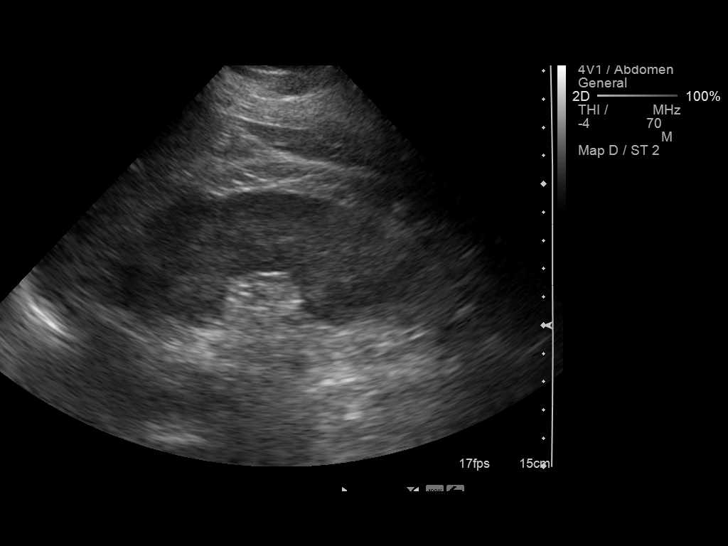
[im 110/110]
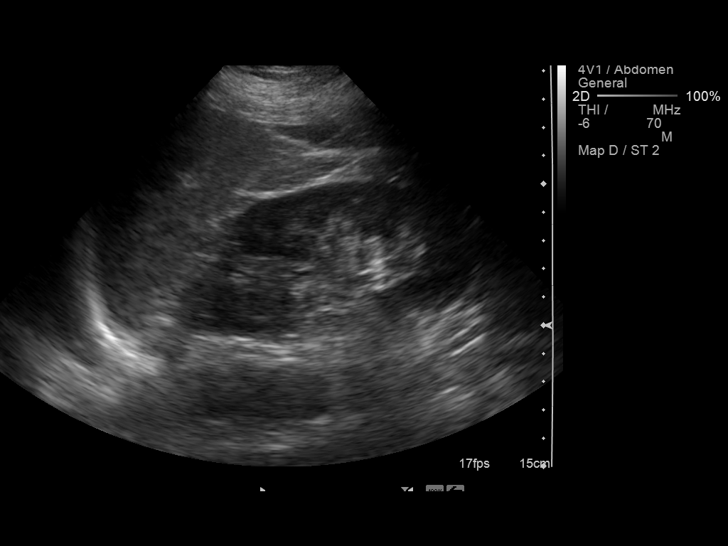

[14 of 25 positions shown; findings below may reference images not displayed]

FINDINGS: Gallbladder:  No gallstones, gallbladder wall thickening, or
pericholecystic fluid.

Common bile duct:  Measures 5 mm in diameter, within normal limits.

Liver:  No focal lesion identified.  Within normal limits in
parenchymal echogenicity.

IVC:  Appears normal.

Pancreas:  The tip of the tail the pancreas is obscured by bowel
gas.  Otherwise unremarkable.

Spleen:  Measures 8 cm craniocaudad and appears normal.

Right Kidney:  Measures 10.2 cm in length and appears normal.

Left Kidney:  Measures 10.5 cm in length and appears normal.

Abdominal aorta:
No aneurysm or significant abnormality observed.
IMPRESSION: Negative abdominal ultrasound.

## 2013-10-05 ENCOUNTER — Other Ambulatory Visit: Payer: Self-pay | Admitting: *Deleted

## 2013-10-05 MED ORDER — PANTOPRAZOLE SODIUM 40 MG PO TBEC: 40.0000 mg | DELAYED_RELEASE_TABLET | Freq: Every day | ORAL | Status: DC

## 2013-10-22 ENCOUNTER — Other Ambulatory Visit: Payer: Self-pay

## 2014-01-13 ENCOUNTER — Telehealth: Payer: Self-pay | Admitting: Cardiology

## 2014-01-13 MED ORDER — NITROGLYCERIN 0.4 MG SL SUBL: 0.4000 mg | SUBLINGUAL_TABLET | SUBLINGUAL | Status: DC | PRN

## 2014-01-13 NOTE — Telephone Encounter (Signed)
Medication sent via escribe.  

## 2014-01-13 NOTE — Telephone Encounter (Signed)
Patient needs new RX for NTG sent to [facility] in [place] . / tgs

## 2014-02-01 ENCOUNTER — Telehealth: Payer: Self-pay | Admitting: *Deleted

## 2014-02-01 MED ORDER — HYDROCHLOROTHIAZIDE 12.5 MG PO CAPS: 12.5000 mg | ORAL_CAPSULE | Freq: Every day | ORAL | Status: DC

## 2014-02-01 NOTE — Telephone Encounter (Signed)
rx sent to pharmacy by e-script  

## 2014-02-01 NOTE — Telephone Encounter (Signed)
Pt has switched pharmacies to [facility] need new Rx called  For hydrochlorothiazide called in

## 2014-03-01 ENCOUNTER — Other Ambulatory Visit: Payer: Self-pay | Admitting: Cardiology

## 2014-03-01 DIAGNOSIS — R002 Palpitations: Secondary | ICD-10-CM

## 2014-03-01 MED ORDER — METOPROLOL SUCCINATE ER 50 MG PO TB24: 50.0000 mg | ORAL_TABLET | Freq: Every day | ORAL | Status: DC

## 2014-03-04 ENCOUNTER — Ambulatory Visit: Payer: Managed Care, Other (non HMO) | Admitting: Cardiology

## 2014-03-24 ENCOUNTER — Encounter: Payer: Self-pay | Admitting: Cardiology

## 2014-03-24 ENCOUNTER — Ambulatory Visit (INDEPENDENT_AMBULATORY_CARE_PROVIDER_SITE_OTHER): Payer: Managed Care, Other (non HMO) | Admitting: Cardiology

## 2014-03-24 VITALS — BP 114/64 | HR 75 | Ht 64.0 in | Wt 167.0 lb

## 2014-03-24 DIAGNOSIS — I341 Nonrheumatic mitral (valve) prolapse: Secondary | ICD-10-CM

## 2014-03-24 DIAGNOSIS — R002 Palpitations: Secondary | ICD-10-CM

## 2014-03-24 DIAGNOSIS — I059 Rheumatic mitral valve disease, unspecified: Secondary | ICD-10-CM

## 2014-03-24 NOTE — Patient Instructions (Signed)
Your physician recommends that you schedule a follow-up appointment in: 6 months with Dr McDowell You will receive a reminder letter two months in advance reminding you to call and schedule your appointment. If you don't receive this letter, please contact our office.  Your physician recommends that you continue on your current medications as directed. Please refer to the Current Medication list given to you today.   

## 2014-03-24 NOTE — Assessment & Plan Note (Signed)
Associated with mild mitral regurgitation.

## 2014-03-24 NOTE — Progress Notes (Signed)
Clinical Summary [name] is a 53 y.o.female last seen in August 2014. She is clearly under a lot of stress now, her husband Rachel Sutton in Hospice, declining with end-stage lung disease and severe pulmonary arterial hypertension. She has experienced more palpitations. Still feels like Toprol-XL is beneficial. We discussed using an extra half dose as needed. Also sees Dr. Luan Sutton for management of anxiety.  Previous Holter monitor showed no definitive dysrhythmia to correlate with symptoms. Rare to occasional PVCs and PACs noted.   Allergies  Allergen Reactions  . Bee Venom Anaphylaxis  . Haloperidol Lactate Other (See Comments)    Convulsions   . Meperidine Hcl Hives    Current Outpatient Prescriptions  Medication Sig Dispense Refill  . aspirin EC 81 MG EC tablet Take 1 tablet (81 mg total) by mouth daily.      . Calcium-Magnesium-Vitamin D (CALCIUM 500 PO) Take 1 tablet by mouth 2 (two) times daily.      . clonazePAM (KLONOPIN) 1 MG tablet 1 mg 3 (three) times daily as needed. For anxiety      . EPINEPHrine (EPI-PEN) 0.3 mg/0.3 mL DEVI Inject 0.3 mg into the muscle as needed. For bee venom      . fish oil-omega-3 fatty acids 1000 MG capsule Take 2 g by mouth every morning.      . hydrochlorothiazide (MICROZIDE) 12.5 MG capsule Take 1 capsule (12.5 mg total) by mouth daily.  90 capsule  0  . metoprolol succinate (TOPROL-XL) 50 MG 24 hr tablet Take 1 tablet (50 mg total) by mouth at bedtime.  30 tablet  3  . Multiple Vitamin (MULITIVITAMIN WITH MINERALS) TABS Take 1 tablet by mouth every morning.      . naproxen sodium (ANAPROX) 220 MG tablet Take 220 mg by mouth as directed. 1-2 TABLETS DAILY      . NIFEdipine (PROCARDIA XL/ADALAT-CC) 90 MG 24 hr tablet Take 1 tablet (90 mg total) by mouth daily.  30 tablet  6  . nitroGLYCERIN (NITROSTAT) 0.4 MG SL tablet Place 1 tablet (0.4 mg total) under the tongue every 5 (five) minutes x 3 doses as needed for chest pain.  25 tablet  3  .  pantoprazole (PROTONIX) 40 MG tablet Take 1 tablet (40 mg total) by mouth daily.  30 tablet  6  . simvastatin (ZOCOR) 40 MG tablet Take 40 mg by mouth every morning.       . sodium chloride (MURO 128) 5 % ophthalmic solution 1 drop as needed.      . traMADol (ULTRAM) 50 MG tablet Take 50 mg by mouth every 6 (six) hours as needed.       No current facility-administered medications for this visit.    Past Medical History  Diagnosis Date  . Fuchs' corneal dystrophy   . Essential hypertension, benign   . Mixed hyperlipidemia   . Mitral valve prolapse     Mild mitral regurgitation  . GERD (gastroesophageal reflux disease)   . Arthritis   . Anxiety   . Breast cancer 02/22/2012    Right T1c, N0    Social History [name] that she has been smoking Cigarettes.  She has a 8.75 pack-year smoking history. She has never used smokeless tobacco. [name] that she drinks alcohol.  Review of Systems Fair appetite, has lost 10 pounds. No chest pain.  Physical Examination Filed Vitals:   03/24/14 1104  BP: 114/64  Pulse: 75   Filed Weights   03/24/14 1104  Weight: 167 lb (75.751 kg)    Comfortable at rest.  HEENT: Conjunctiva and lids normal, oropharynx clear  Neck: Supple, no elevated JVP or carotid bruits, no thyromegaly.  Lungs: Clear to auscultation, nonlabored breathing at rest.  Cardiac: Regular rate and rhythm, no S3 or significant systolic murmur, no pericardial rub.  Abdomen: Soft, nontender, bowel sounds present.  Extremities: No pitting edema, distal pulses 2+.    Problem List and Plan   Palpitations Continue Toprol-XL 50 mg daily, can use extra 25 mg as needed.  Mitral valve prolapse, nonrheumatic Associated with mild mitral regurgitation.    Satira Sark, M.D., F.A.C.C.

## 2014-03-24 NOTE — Assessment & Plan Note (Signed)
Continue Toprol-XL 50 mg daily, can use extra 25 mg as needed.

## 2014-03-28 ENCOUNTER — Other Ambulatory Visit: Payer: Self-pay | Admitting: Adult Health

## 2014-04-01 ENCOUNTER — Other Ambulatory Visit: Payer: Self-pay | Admitting: Adult Health

## 2014-04-28 ENCOUNTER — Other Ambulatory Visit: Payer: Self-pay | Admitting: Adult Health

## 2014-05-06 ENCOUNTER — Other Ambulatory Visit: Payer: Self-pay | Admitting: Cardiology

## 2014-06-07 ENCOUNTER — Telehealth: Payer: Self-pay | Admitting: Cardiology

## 2014-06-07 MED ORDER — PANTOPRAZOLE SODIUM 40 MG PO TBEC: 40.0000 mg | DELAYED_RELEASE_TABLET | Freq: Every day | ORAL | Status: DC

## 2014-06-07 NOTE — Telephone Encounter (Signed)
Received fax refill request  Rx # [id number] Medication:  Pantoprazole 40 mg tablet Qty 30 Sig:  Take one tablet by mouth every day Physician:  Domenic Polite

## 2014-06-07 NOTE — Telephone Encounter (Signed)
Medication sent via escribe.  

## 2014-08-04 ENCOUNTER — Other Ambulatory Visit: Payer: Self-pay | Admitting: Cardiology

## 2014-08-04 NOTE — Telephone Encounter (Signed)
Verbally called into [facility] in [place]

## 2014-08-17 DIAGNOSIS — F32A Depression, unspecified: Secondary | ICD-10-CM | POA: Insufficient documentation

## 2014-09-20 ENCOUNTER — Encounter: Payer: Self-pay | Admitting: Cardiology

## 2014-09-20 ENCOUNTER — Ambulatory Visit (INDEPENDENT_AMBULATORY_CARE_PROVIDER_SITE_OTHER): Payer: Managed Care, Other (non HMO) | Admitting: Cardiology

## 2014-09-20 VITALS — BP 106/68 | HR 66 | Ht 64.0 in | Wt 169.0 lb

## 2014-09-20 DIAGNOSIS — R002 Palpitations: Secondary | ICD-10-CM

## 2014-09-20 DIAGNOSIS — I10 Essential (primary) hypertension: Secondary | ICD-10-CM

## 2014-09-20 DIAGNOSIS — R0789 Other chest pain: Secondary | ICD-10-CM

## 2014-09-20 DIAGNOSIS — I341 Nonrheumatic mitral (valve) prolapse: Secondary | ICD-10-CM

## 2014-09-20 MED ORDER — ISOSORBIDE MONONITRATE ER 30 MG PO TB24
15.0000 mg | ORAL_TABLET | Freq: Every day | ORAL | Status: DC
Start: 2014-09-20 — End: 2014-11-18

## 2014-09-20 NOTE — Progress Notes (Signed)
Clinical Summary [name] is a 53 y.o.female last seen in April. Her husband [name] has passed away since I  last saw her. She is still going through the grieving process, has been referred to behavioral health by Dr. Luan Pulling. She reports no specific sense of palpitations, but has been having some chest pain symptoms at nighttime. She has been using sublingual nitroglycerin perhaps twice a week.  ECG today shows sinus rhythm, nonspecific ST changes.   Allergies  Allergen Reactions  . Bee Venom Anaphylaxis  . Haloperidol Lactate Other (See Comments)    Convulsions   . Meperidine Hcl Hives    Current Outpatient Prescriptions  Medication Sig Dispense Refill  . aspirin EC 81 MG EC tablet Take 1 tablet (81 mg total) by mouth daily.      . Calcium-Magnesium-Vitamin D (CALCIUM 500 PO) Take 1 tablet by mouth 2 (two) times daily.      . clonazePAM (KLONOPIN) 2 MG tablet Take 2 mg by mouth 2 (two) times daily.      Marland Kitchen EPINEPHrine (EPI-PEN) 0.3 mg/0.3 mL DEVI Inject 0.3 mg into the muscle as needed. For bee venom      . fish oil-omega-3 fatty acids 1000 MG capsule Take 2 g by mouth every morning.      . furosemide (LASIX) 40 MG tablet Take 40 mg by mouth.      . hydrochlorothiazide (MICROZIDE) 12.5 MG capsule TAKE 1 CAPSULE BY MOUTH DAILY  90 capsule  3  . metoprolol succinate (TOPROL-XL) 50 MG 24 hr tablet TAKE 1 TABLET BY MOUTH EVERY NIGHT AT BEDTIME  30 tablet  3  . naproxen sodium (ANAPROX) 220 MG tablet Take 220 mg by mouth as directed. 1-2 TABLETS DAILY      . NIFEdipine (PROCARDIA XL/ADALAT-CC) 90 MG 24 hr tablet TAKE 1 TABLET BY MOUTH EVERY DAY  90 tablet  3  . nitroGLYCERIN (NITROSTAT) 0.4 MG SL tablet Place 1 tablet (0.4 mg total) under the tongue every 5 (five) minutes x 3 doses as needed for chest pain.  25 tablet  3  . pantoprazole (PROTONIX) 40 MG tablet Take 1 tablet (40 mg total) by mouth daily.  30 tablet  6  . PARoxetine (PAXIL) 20 MG tablet Take 20 mg by mouth daily.      .  simvastatin (ZOCOR) 40 MG tablet Take 40 mg by mouth every morning.       . sodium chloride (MURO 128) 5 % ophthalmic solution 1 drop as needed.      . traMADol (ULTRAM) 50 MG tablet Take 50 mg by mouth every 6 (six) hours as needed.      . isosorbide mononitrate (IMDUR) 30 MG 24 hr tablet Take 0.5 tablets (15 mg total) by mouth at bedtime.  45 tablet  3  . Multiple Vitamin (MULITIVITAMIN WITH MINERALS) TABS Take 1 tablet by mouth every morning.       No current facility-administered medications for this visit.    Past Medical History  Diagnosis Date  . Fuchs' corneal dystrophy   . Essential hypertension, benign   . Mixed hyperlipidemia   . Mitral valve prolapse     Mild mitral regurgitation  . GERD (gastroesophageal reflux disease)   . Arthritis   . Anxiety   . Breast cancer 02/22/2012    Right T1c, N0    Social History [name] that she has been smoking Cigarettes.  She has a 8.75 pack-year smoking history. She has never used smokeless  tobacco. [name] that she drinks alcohol.  Review of Systems Other systems reviewed and negative except as outlined.  Physical Examination Filed Vitals:   09/20/14 0859  BP: 106/68  Pulse: 66   Filed Weights   09/20/14 0859  Weight: 169 lb (76.658 kg)    No distress. HEENT: Conjunctiva and lids normal, oropharynx clear  Neck: Supple, no elevated JVP or carotid bruits, no thyromegaly.  Lungs: Clear to auscultation, nonlabored breathing at rest.  Cardiac: Regular rate and rhythm, no S3 or significant systolic murmur, no pericardial rub.  Abdomen: Soft, nontender, bowel sounds present.  Extremities: No pitting edema, distal pulses 2+.    Problem List and Plan   Palpitations Presently controlled on beta blocker and calcium channel blocker.  Atypical chest pain Has been having more symptoms in the setting of grief following her husband's death. She is using nitroglycerin with some benefit. We will try Imdur 15 mg at  nighttime.  Essential hypertension Blood pressure is normal today.    Satira Sark, M.D., F.A.C.C.

## 2014-09-20 NOTE — Patient Instructions (Signed)
Your physician recommends that you schedule a follow-up appointment in: 4 months      Your physician has recommended you make the following change in your medication:     START Imdur 15 mg at bedtime (take 1/2 tablet of 30 mg pill)     Thank you for choosing [facility] !

## 2014-09-20 NOTE — Assessment & Plan Note (Signed)
Has been having more symptoms in the setting of grief following her husband's death. She is using nitroglycerin with some benefit. We will try Imdur 15 mg at nighttime.

## 2014-09-20 NOTE — Assessment & Plan Note (Signed)
Blood pressure is normal today. 

## 2014-09-20 NOTE — Assessment & Plan Note (Signed)
Presently controlled on beta blocker and calcium channel blocker.

## 2014-10-20 ENCOUNTER — Ambulatory Visit (INDEPENDENT_AMBULATORY_CARE_PROVIDER_SITE_OTHER): Payer: Managed Care, Other (non HMO) | Admitting: Psychiatry

## 2014-10-20 ENCOUNTER — Encounter (HOSPITAL_COMMUNITY): Payer: Self-pay | Admitting: Psychiatry

## 2014-10-20 VITALS — BP 114/79 | HR 81 | Ht 64.0 in | Wt 169.6 lb

## 2014-10-20 DIAGNOSIS — F322 Major depressive disorder, single episode, severe without psychotic features: Secondary | ICD-10-CM

## 2014-10-20 DIAGNOSIS — F329 Major depressive disorder, single episode, unspecified: Secondary | ICD-10-CM

## 2014-10-20 MED ORDER — PAROXETINE HCL 20 MG PO TABS: 20.0000 mg | ORAL_TABLET | Freq: Every day | ORAL | Status: DC

## 2014-10-20 MED ORDER — BUPROPION HCL ER (XL) 150 MG PO TB24: 150.0000 mg | ORAL_TABLET | ORAL | Status: DC

## 2014-10-20 MED ORDER — ALPRAZOLAM 1 MG PO TABS: 1.0000 mg | ORAL_TABLET | Freq: Three times a day (TID) | ORAL | Status: DC | PRN
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
## 2014-10-21 ENCOUNTER — Telehealth (HOSPITAL_COMMUNITY): Payer: Self-pay | Admitting: *Deleted

## 2014-10-21 ENCOUNTER — Encounter (HOSPITAL_COMMUNITY): Payer: Self-pay | Admitting: Psychiatry

## 2014-10-21 ENCOUNTER — Other Ambulatory Visit: Payer: Self-pay | Admitting: Cardiology

## 2014-10-21 ENCOUNTER — Encounter (HOSPITAL_COMMUNITY): Payer: Self-pay | Admitting: *Deleted

## 2014-10-21 NOTE — Telephone Encounter (Signed)
done

## 2014-10-22 ENCOUNTER — Telehealth (HOSPITAL_COMMUNITY): Payer: Self-pay | Admitting: *Deleted

## 2014-10-22 NOTE — Telephone Encounter (Signed)
Daughter wanted her to call. She is having a hard time switching to xanax but is willing to give it more time

## 2014-10-26 ENCOUNTER — Telehealth (HOSPITAL_COMMUNITY): Payer: Self-pay | Admitting: *Deleted

## 2014-10-26 NOTE — Telephone Encounter (Signed)
Pt came in to pick up her letter she requested for Dr. Harrington Challenger to write for her work. Pt DL number is [id number]. Pt agrees with letter.

## 2014-10-29 ENCOUNTER — Telehealth (HOSPITAL_COMMUNITY): Payer: Self-pay | Admitting: *Deleted

## 2014-10-29 NOTE — Telephone Encounter (Signed)
Faxed pt Short Term Disability medical Questionnaire form to [place]. Claims Assistant. Per pt release of information on file.

## 2014-11-16 ENCOUNTER — Other Ambulatory Visit (HOSPITAL_COMMUNITY): Payer: Self-pay | Admitting: Psychiatry

## 2014-11-18 ENCOUNTER — Encounter (HOSPITAL_COMMUNITY): Payer: Self-pay | Admitting: Psychiatry

## 2014-11-18 ENCOUNTER — Ambulatory Visit (INDEPENDENT_AMBULATORY_CARE_PROVIDER_SITE_OTHER): Payer: Managed Care, Other (non HMO) | Admitting: Psychiatry

## 2014-11-18 VITALS — BP 120/75 | HR 76 | Ht 64.0 in | Wt 170.4 lb

## 2014-11-18 DIAGNOSIS — F329 Major depressive disorder, single episode, unspecified: Secondary | ICD-10-CM

## 2014-11-18 DIAGNOSIS — F322 Major depressive disorder, single episode, severe without psychotic features: Secondary | ICD-10-CM

## 2014-11-18 MED ORDER — PAROXETINE HCL 20 MG PO TABS: 20.0000 mg | ORAL_TABLET | Freq: Every day | ORAL | Status: DC

## 2014-11-18 MED ORDER — ALPRAZOLAM 1 MG PO TABS: 1.0000 mg | ORAL_TABLET | Freq: Four times a day (QID) | ORAL | Status: DC
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
## 2014-11-22 ENCOUNTER — Ambulatory Visit (INDEPENDENT_AMBULATORY_CARE_PROVIDER_SITE_OTHER): Payer: Managed Care, Other (non HMO) | Admitting: Psychology

## 2014-11-22 DIAGNOSIS — F322 Major depressive disorder, single episode, severe without psychotic features: Secondary | ICD-10-CM

## 2014-11-25 ENCOUNTER — Encounter (HOSPITAL_COMMUNITY): Payer: Self-pay | Admitting: Cardiology

## 2014-12-21 ENCOUNTER — Ambulatory Visit (INDEPENDENT_AMBULATORY_CARE_PROVIDER_SITE_OTHER): Payer: Managed Care, Other (non HMO) | Admitting: Psychiatry

## 2014-12-21 ENCOUNTER — Encounter (HOSPITAL_COMMUNITY): Payer: Self-pay | Admitting: Psychiatry

## 2014-12-21 VITALS — BP 109/69 | HR 84 | Ht 64.0 in | Wt 171.8 lb

## 2014-12-21 DIAGNOSIS — F322 Major depressive disorder, single episode, severe without psychotic features: Secondary | ICD-10-CM

## 2014-12-21 DIAGNOSIS — F329 Major depressive disorder, single episode, unspecified: Secondary | ICD-10-CM

## 2014-12-21 MED ORDER — TRAZODONE HCL 100 MG PO TABS: 100.0000 mg | ORAL_TABLET | Freq: Every day | ORAL | Status: DC

## 2014-12-21 MED ORDER — ALPRAZOLAM 1 MG PO TABS: 1.0000 mg | ORAL_TABLET | Freq: Four times a day (QID) | ORAL | Status: DC

## 2014-12-21 MED ORDER — PAROXETINE HCL 20 MG PO TABS: 20.0000 mg | ORAL_TABLET | Freq: Every day | ORAL | Status: DC
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
## 2014-12-22 ENCOUNTER — Telehealth (HOSPITAL_COMMUNITY): Payer: Self-pay | Admitting: *Deleted

## 2014-12-22 ENCOUNTER — Encounter (HOSPITAL_COMMUNITY): Payer: Self-pay | Admitting: Psychiatry

## 2014-12-22 ENCOUNTER — Ambulatory Visit (INDEPENDENT_AMBULATORY_CARE_PROVIDER_SITE_OTHER): Payer: Managed Care, Other (non HMO) | Admitting: Psychology

## 2014-12-22 DIAGNOSIS — F322 Major depressive disorder, single episode, severe without psychotic features: Secondary | ICD-10-CM

## 2014-12-22 NOTE — Telephone Encounter (Signed)
Pt calling asking Dr. Harrington Challenger to write a work note stating she will be out of work until her next visit which would be Jan 21, 2015. Pt also would like for Dr. Harrington Challenger to fill out a information for her insurance company [employer]. Pt would like form to be faxed to insurance company but will need to pick up letter to take to her work. Pt number is [phone].

## 2014-12-22 NOTE — Telephone Encounter (Signed)
Pt is aware that her letter is ready for pick up. Pt showed understanding and states she will pick letter up tomorrow 12-23-14

## 2014-12-22 NOTE — Telephone Encounter (Signed)
Letter completed.

## 2014-12-23 ENCOUNTER — Telehealth (HOSPITAL_COMMUNITY): Payer: Self-pay | Admitting: *Deleted

## 2014-12-23 NOTE — Telephone Encounter (Signed)
Pt came in to get letter for her work stating how long she could be out of work. Pt D/L number is [id number]. Pt agrees with the letter

## 2014-12-24 ENCOUNTER — Telehealth (HOSPITAL_COMMUNITY): Payer: Self-pay | Admitting: *Deleted

## 2014-12-24 NOTE — Telephone Encounter (Signed)
Faxed completed form Dr. Harrington Challenger filled out for pt insurance company [employer]. Pt is aware

## 2015-01-13 ENCOUNTER — Ambulatory Visit (INDEPENDENT_AMBULATORY_CARE_PROVIDER_SITE_OTHER): Payer: Managed Care, Other (non HMO) | Admitting: Psychology

## 2015-01-13 DIAGNOSIS — F322 Major depressive disorder, single episode, severe without psychotic features: Secondary | ICD-10-CM

## 2015-01-18 ENCOUNTER — Ambulatory Visit: Payer: Self-pay | Admitting: Cardiology

## 2015-01-21 ENCOUNTER — Encounter (HOSPITAL_COMMUNITY): Payer: Self-pay | Admitting: Psychiatry

## 2015-01-21 ENCOUNTER — Other Ambulatory Visit: Payer: Self-pay | Admitting: Cardiology

## 2015-01-21 ENCOUNTER — Ambulatory Visit (INDEPENDENT_AMBULATORY_CARE_PROVIDER_SITE_OTHER): Payer: Managed Care, Other (non HMO) | Admitting: Psychiatry

## 2015-01-21 VITALS — BP 114/70 | HR 75 | Ht 64.0 in | Wt 171.0 lb

## 2015-01-21 DIAGNOSIS — F329 Major depressive disorder, single episode, unspecified: Secondary | ICD-10-CM

## 2015-01-21 DIAGNOSIS — F322 Major depressive disorder, single episode, severe without psychotic features: Secondary | ICD-10-CM

## 2015-01-21 MED ORDER — PAROXETINE HCL 20 MG PO TABS: 20.0000 mg | ORAL_TABLET | Freq: Every day | ORAL | Status: DC

## 2015-01-21 MED ORDER — ALPRAZOLAM 1 MG PO TABS: 1.0000 mg | ORAL_TABLET | Freq: Four times a day (QID) | ORAL | Status: DC
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
## 2015-02-02 ENCOUNTER — Ambulatory Visit (INDEPENDENT_AMBULATORY_CARE_PROVIDER_SITE_OTHER): Payer: Managed Care, Other (non HMO) | Admitting: Cardiology

## 2015-02-02 ENCOUNTER — Encounter: Payer: Self-pay | Admitting: Cardiology

## 2015-02-02 VITALS — BP 104/78 | HR 86 | Ht 64.25 in | Wt 170.0 lb

## 2015-02-02 DIAGNOSIS — R002 Palpitations: Secondary | ICD-10-CM

## 2015-02-02 DIAGNOSIS — I10 Essential (primary) hypertension: Secondary | ICD-10-CM

## 2015-02-02 DIAGNOSIS — I341 Nonrheumatic mitral (valve) prolapse: Secondary | ICD-10-CM

## 2015-02-02 NOTE — Progress Notes (Signed)
Cardiology Office Note  Date: 02/02/2015   ID: Rachel Sutton, DOB 1961-11-19, MRN [id number]  PCP: Rachel Bogus, MD  Primary Cardiologist: Rachel Lesches, MD   Chief Complaint  Patient presents with  . Mitral valve disease  . Hypertension    History of Present Illness: Rachel Sutton is a 54 y.o. female last seen in October 2015. She is here for a routine visit. She tells me that she has slowly been getting better after the passing of her husband [name] last year. She just went back to work this week. Seems to have more energy. Still having some palpitations at nighttime. She is not having any chest pain however, and stopped the low-dose Imdur that we added last time.  I reviewed her medications. She continues on nifedipine in the morning and Toprol-XL and the evening. We talked about taking in a half dose of Toprol-XL in the evening if her palpitations worsen.  We also reviewed her last echocardiogram from 2013, outlined below.   Past Medical History  Diagnosis Date  . Fuchs' corneal dystrophy   . Essential hypertension, benign   . Mixed hyperlipidemia   . Mitral valve prolapse     Mild mitral regurgitation  . GERD (gastroesophageal reflux disease)   . Arthritis   . Anxiety   . Breast cancer 02/22/2012    Right T1c, N0  . Depression     Past Surgical History  Procedure Laterality Date  . Carpal tunnel r/l  1992  . Ray cage fusion  1997  . Cervical spine fusion x2 since last visit    . Elbow surgery   tennis elbow release 2012  . Abdominal hysterectomy  1999  . Sentinel lymph node biopsy  10/30/2002    right  . Ovarian cyst removal      prior to hysterectomy, right  . Breast lumpectomy  10/30/2002    right  . Esophagogastroduodenoscopy  10/26/2012    Procedure: ESOPHAGOGASTRODUODENOSCOPY (EGD);  Surgeon: Rachel Castle, MD;  Location: [facility];  Service: Endoscopy;  Laterality: N/A;  . Left heart catheterization with coronary angiogram N/A 10/23/2012    Procedure: LEFT HEART CATHETERIZATION WITH CORONARY ANGIOGRAM;  Surgeon: Rachel M Martinique, MD;  Location: [facility] CATH LAB;  Service: Cardiovascular;  Laterality: N/A;    Current Outpatient Prescriptions  Medication Sig Dispense Refill  . ALPRAZolam (XANAX) 1 MG tablet Take 1 tablet (1 mg total) by mouth 4 (four) times daily. 120 tablet 2  . aspirin EC 81 MG EC tablet Take 1 tablet (81 mg total) by mouth daily.    . Calcium-Magnesium-Vitamin D (CALCIUM 500 PO) Take 1 tablet by mouth 2 (two) times daily.    . Celecoxib (CELEBREX PO) Take by mouth daily.    Marland Kitchen EPINEPHrine (EPI-PEN) 0.3 mg/0.3 mL DEVI Inject 0.3 mg into the muscle as needed. For bee venom    . fish oil-omega-3 fatty acids 1000 MG capsule Take 2 g by mouth every morning.    . hydrochlorothiazide (MICROZIDE) 12.5 MG capsule TAKE 1 CAPSULE BY MOUTH DAILY (Patient taking differently: TAKE 1 CAPSULE BY MOUTH DAILY PRN) 90 capsule 3  . metoprolol succinate (TOPROL-XL) 50 MG 24 hr tablet TAKE 1 TABLET BY MOUTH EVERY NIGHT AT BEDTIME 30 tablet 6  . Multiple Vitamin (MULITIVITAMIN WITH MINERALS) TABS Take 1 tablet by mouth every morning.    . naproxen sodium (ANAPROX) 220 MG tablet Take 220 mg by mouth as needed. 1-2 TABLETS DAILY    . NIFEdipine (PROCARDIA  XL/ADALAT-CC) 90 MG 24 hr tablet Take 90 mg by mouth daily.   3  . nitroGLYCERIN (NITROSTAT) 0.4 MG SL tablet Place 1 tablet (0.4 mg total) under the tongue every 5 (five) minutes x 3 doses as needed for chest pain. 25 tablet 3  . pantoprazole (PROTONIX) 40 MG tablet TAKE 1 TABLET BY MOUTH EVERY DAY 30 tablet 11  . PARoxetine (PAXIL) 20 MG tablet Take 1 tablet (20 mg total) by mouth daily. 30 tablet 2  . simvastatin (ZOCOR) 40 MG tablet Take 40 mg by mouth every morning.     . sodium chloride (MURO 128) 5 % ophthalmic solution 1 drop as needed.    . traMADol (ULTRAM) 50 MG tablet Take 50 mg by mouth every 6 (six) hours as needed.     No current facility-administered medications for this  visit.    Allergies:  Bee venom; Haloperidol lactate; and Meperidine hcl   Social History: The patient  reports that she has been smoking Cigarettes.  She started smoking about 40 years ago. She has a 8.75 pack-year smoking history. She has never used smokeless tobacco. She reports that she drinks alcohol. She reports that she does not use illicit drugs.   Family History: The patient's family history includes Brain cancer in her cousin and maternal grandfather; Brain cancer (age of onset: 74) in her brother; Cancer in her maternal uncle; Diabetes in her father and mother; Esophageal cancer (age of onset: 14) in her father; Heart disease in her father and paternal grandfather; Lung cancer in her father and maternal grandmother; Pancreatic cancer in her mother; Schizophrenia in her sister. There is no history of Anesthesia problems, Hypotension, Malignant hyperthermia, or Pseudochol deficiency.   ROS:  Please see the history of present illness. Otherwise, complete review of systems is positive for none.  All other systems are reviewed and negative.   Physical Exam: VS:  BP 104/78 mmHg  Pulse 86  Ht 5' 4.25" (1.632 m)  Wt 170 lb (77.111 kg)  BMI 28.95 kg/m2  SpO2 95%, BMI Body mass index is 28.95 kg/(m^2).  Wt Readings from Last 3 Encounters:  02/02/15 170 lb (77.111 kg)  01/21/15 171 lb (77.565 kg)  12/21/14 171 lb 12.8 oz (77.928 kg)     She appears comfortable at rest. HEENT: Conjunctiva and lids normal, oropharynx clear  Neck: Supple, no elevated JVP or carotid bruits, no thyromegaly.  Lungs: Clear to auscultation, nonlabored breathing at rest.  Cardiac: Regular rate and rhythm, no S3 or significant systolic murmur, no pericardial rub.  Abdomen: Soft, nontender, bowel sounds present.  Extremities: No pitting edema, distal pulses 2+.  Skin: Warm and dry. Musculoskeletal: No kyphosis. Neuropsychiatric: Alert and oriented 3, affect appropriate.   ECG: ECG is not ordered  today.   Other Studies Reviewed Today:  Echocardiogram 10/24/2012: Study Conclusions  - Left ventricle: The cavity size was normal. Wall thickness was increased in a pattern of mild LVH. There was mild focal basal hypertrophy of the septum. Systolic function was normal. The estimated ejection fraction was in the range of 50% to 55%. Wall motion was normal; there were no regional wall motion abnormalities. Left ventricular diastolic function parameters were normal. - Mitral valve: Prolapse. Prolapse, involving the posterior leaflet. Mild regurgitation.  ASSESSMENT AND PLAN:  1. Palpitations, stable overall on current regimen including Procardia and Toprol-XL. No changes made to current doses. Continue observation. She could take an additional half Toprol-XL dose in the evening if symptoms worsen.  2. Mitral  valve prolapse with mild mitral regurgitation by last assessment in 2013. Follow-up echocardiogram will be obtained.  3. Essential hypertension, blood pressure is normal today.  Current medicines are reviewed at length with the patient today.  The patient has concerns regarding medicines.  The following changes have been made: We have discontinued Imdur due to headache side effect.   Orders Placed This Encounter  Procedures  . 2D Echocardiogram with contrast    Disposition: FU with me in 6 months.   Signed, Satira Sark, MD, [facility] 02/02/2015 2:01 PM    [facility] at [facility] [address] [place], [place] [address] Phone: [phone]; Fax: [phone]

## 2015-02-02 NOTE — Patient Instructions (Signed)

## 2015-02-03 ENCOUNTER — Ambulatory Visit (HOSPITAL_COMMUNITY)
Admission: RE | Admit: 2015-02-03 | Discharge: 2015-02-03 | Disposition: A | Discharge status: Home / Self Care | Payer: Managed Care, Other (non HMO) | Source: Ambulatory Visit | Attending: Cardiology | Admitting: Cardiology

## 2015-02-03 DIAGNOSIS — I340 Nonrheumatic mitral (valve) insufficiency: Secondary | ICD-10-CM

## 2015-02-03 DIAGNOSIS — I341 Nonrheumatic mitral (valve) prolapse: Secondary | ICD-10-CM | POA: Diagnosis present

## 2015-02-03 DIAGNOSIS — R002 Palpitations: Secondary | ICD-10-CM | POA: Diagnosis not present

## 2015-02-03 DIAGNOSIS — Z8249 Family history of ischemic heart disease and other diseases of the circulatory system: Secondary | ICD-10-CM | POA: Insufficient documentation

## 2015-02-03 DIAGNOSIS — Z720 Tobacco use: Secondary | ICD-10-CM | POA: Diagnosis not present

## 2015-02-03 NOTE — Progress Notes (Signed)
  Echocardiogram 2D Echocardiogram has been performed.  Rachel Sutton 02/03/2015, 3:10 PM

## 2015-02-15 ENCOUNTER — Encounter (HOSPITAL_COMMUNITY): Payer: Self-pay | Admitting: Psychology

## 2015-02-15 ENCOUNTER — Ambulatory Visit (HOSPITAL_COMMUNITY): Payer: Self-pay | Admitting: Psychology

## 2015-02-15 NOTE — Progress Notes (Signed)
Patient:  Rachel Sutton   DOB: Jun 28, 1961  MR Number: [id number]  Location: [facility] [facility] [address] [place] [address] Dept: [phone]  Start: 11 AM End: 12 PM  Provider/Observer:     Edgardo Roys PSYD  Chief Complaint:      Chief Complaint  Patient presents with  . Anxiety  . Depression    Reason For Service:     The patient is a 54 year old widowed femalewas referred by Dr. Harrington Challenger for psychotherapeutic interventions. The patient reports that she has been working at [employer] for some time but has been out of work with issues related to significant depression and difficulty coping. The patient reports that both of her parents been put in long-term hospital care and that her husband died this past 08-18-23. The patient reports that she has had other stressors including her daughter and her daughter's new husband along with her 52 year old moving in with him. The patient reports that she is sleeping very poorly and she does not sleep at all unless she takes Xanax. She reports that she tosses and turns throughout the night.  The patient denies any significant prior history of psychiatric illness or treatment. She has suffered a number of recent family deaths. Her mother died of pancreatic cancer in 17-Mar-2012 and that her father died 41 months later. In 2013/03/17 her husband was diagnosed with myocardial infarction and COPD and he quickly deteriorated and died in August 17, 2014. She reports significant deterioration in her overall functioning since her husband died. She describes significant problems with maintaining attention and concentration. She reports that her daughter and her son-in-law along with her 38 year old granddaughters moved in with them recently. She has not been able to work recently because of significant cognitive issues related to her underlying psychiatric depression anxiety.  this patient is  a 54 year old widowed white female who is currently living with her daughter son-in-law and 33 year old granddaughter in [place]. 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   Interventions Strategy:  Cognitive/behavioral psychotherapeutic interventions  Participation Level:   Active  Participation Quality:  Appropriate      Behavioral Observation:  Well Groomed, Alert, and Depressed.   Current Psychosocial Factors: The patient reports that there are a lot of stressors that have been going on with her and his stream of loss is related to both of her parents and her husband. More recently the stress of her daughter and her family moving in with the patient has also been playing a role although that is also been somewhat helpful to her. The patient reports that she has not been able to work recently because of these issues.  Content of Session:   Review current symptoms and work on therapeutic interventions around severe depression that is exceeded the ramifications of simple bereavement.  Current Status:   The patient is currently taking Paxil and continues to take Xanax although these benzodiazepines are being reduced. The patient reports that she continues to have a lot of stress follow her shoulders and is continuing to have difficulty coping.  Patient Progress:   stable  Target Goals:   Target goals include reducing the intensity, severity, and duration of significant depressive symptoms.  Last Reviewed:   11/21/2014  Goals Addressed Today:    Today we worked on issues related to her recurrent recurrent and severe depressive symptoms and difficulty coping with stressors along with insomnia.  Impression/Diagnosis:   The patient has had a number of significant emotional setbacks related to deaths in her family. Her mother died been soon after that her father died. A year later husband died after deteriorating very suddenly. The patient reports  that she became severely depressed and started having significant cognitive difficulties associated with this depression anxiety.  Diagnosis:    Axis I: Major depressive disorder, single episode, severe without psychotic features

## 2015-02-15 NOTE — Progress Notes (Signed)
Patient:  BRETT SOZA   DOB: Oct 09, 1961  MR Number: [id number]  Location: [facility] [address] [place] [address] Dept: [phone]  Start: 4 PM End: 5 PM  Provider/Observer:     Edgardo Roys PSYD  Chief Complaint:      Chief Complaint  Patient presents with  . Depression    Reason For Service:     The patient is a 54 year old widowed femalewas referred by Dr. Harrington Challenger for psychotherapeutic interventions. The patient reports that she has been working at [employer] for some time but has been out of work with issues related to significant depression and difficulty coping. The patient reports that both of her parents been put in long-term hospital care and that her husband died this past 2023-09-05. The patient reports that she has had other stressors including her daughter and her daughter's new husband along with her 25 year old moving in with him. The patient reports that she is sleeping very poorly and she does not sleep at all unless she takes Xanax. She reports that she tosses and turns throughout the night.  The patient denies any significant prior history of psychiatric illness or treatment. She has suffered a number of recent family deaths. Her mother died of pancreatic cancer in 04-04-2012 and that her father died 31 months later. In 04/04/2013 her husband was diagnosed with myocardial infarction and COPD and he quickly deteriorated and died in 09/04/2014. She reports significant deterioration in her overall functioning since her husband died. She describes significant problems with maintaining attention and concentration. She reports that her daughter and her son-in-law along with her 51 year old granddaughters moved in with them recently. She has not been able to work recently because of significant cognitive issues related to her underlying psychiatric depression anxiety.  this patient is a 54 year old  widowed white female who is currently living with her daughter son-in-law and 47 year old granddaughter in [place]. 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   Interventions Strategy:  Cognitive/behavioral psychotherapeutic interventions  Participation Level:   Active  Participation Quality:  Appropriate      Behavioral Observation:  Well Groomed, Alert, and Depressed.   Current Psychosocial Factors: The patient reports that feels like she is getting more back to her normal self. However, she reports continued issues of depression but feels like she is on the right path.  Content of Session:   Review current symptoms and work on therapeutic interventions around severe depression that is exceeded the ramifications of simple bereavement.  Current Status:   The patient reports that she is improving and that she has been actively working on the therapeutic interventions we have been developing.  Patient Progress:   stable  Target Goals:   Target goals include reducing the intensity, severity, and duration of significant depressive symptoms.  Last Reviewed:   01/13/2015  Goals Addressed Today:    Today we worked on issues related to her recurrent recurrent and severe depressive symptoms and difficulty coping with stressors along with insomnia.  Impression/Diagnosis:   The patient has had a number of significant emotional setbacks related to deaths in her family. Her mother died been soon after that her father died. A year later husband died after deteriorating very suddenly. The patient reports that she became severely depressed and started having significant cognitive difficulties associated with this depression anxiety.  Diagnosis:    Axis I: Major depressive disorder,  single episode, severe without psychotic features

## 2015-02-15 NOTE — Progress Notes (Signed)
Patient:  Rachel Sutton   DOB: 1961-09-30  MR Number: [id number]  Location: [facility] [address] [place] [address] Dept: [phone]  Start: 11 AM End: 12 PM  Provider/Observer:     Edgardo Roys PSYD  Chief Complaint:      Chief Complaint  Patient presents with  . Anxiety  . Depression  . Stress  . Trauma    Reason For Service:     The patient is a 54 year old widowed femalewas referred by Dr. Harrington Challenger for psychotherapeutic interventions. The patient reports that she has been working at [employer] for some time but has been out of work with issues related to significant depression and difficulty coping. The patient reports that both of her parents been put in long-term hospital care and that her husband died this past 09-15-2023. The patient reports that she has had other stressors including her daughter and her daughter's new husband along with her 60 year old moving in with him. The patient reports that she is sleeping very poorly and she does not sleep at all unless she takes Xanax. She reports that she tosses and turns throughout the night.  The patient denies any significant prior history of psychiatric illness or treatment. She has suffered a number of recent family deaths. Her mother died of pancreatic cancer in Apr 14, 2012 and that her father died 23 months later. In 2013/04/14 her husband was diagnosed with myocardial infarction and COPD and he quickly deteriorated and died in 14-Sep-2014. She reports significant deterioration in her overall functioning since her husband died. She describes significant problems with maintaining attention and concentration. She reports that her daughter and her son-in-law along with her 54 year old granddaughters moved in with them recently. She has not been able to work recently because of significant cognitive issues related to her underlying psychiatric depression  anxiety.  this patient is a 54 year old widowed white female who is currently living with her daughter son-in-law and 44 year old granddaughter in [place]. 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   Interventions Strategy:  Cognitive/behavioral psychotherapeutic interventions  Participation Level:   Active  Participation Quality:  Appropriate      Behavioral Observation:  Well Groomed, Alert, and Depressed.   Current Psychosocial Factors: The patient reports that she is started begun working on some of the behavioral changes we have implemented. She reports that she is trying to get frequent exercise and getting outside more often. The patient reports that she is also spending some time working on her relationship with her daughter.  Content of Session:   Review current symptoms and work on therapeutic interventions around severe depression that is exceeded the ramifications of simple bereavement.  Current Status:   The patient is currently taking Paxil and continues to take Xanax although these benzodiazepines are being reduced. The patient reports that she continues to have a lot of stress follow her shoulders and is continuing to have difficulty coping.  Patient Progress:   stable  Target Goals:   Target goals include reducing the intensity, severity, and duration of significant depressive symptoms.  Last Reviewed:   12/22/2014  Goals Addressed Today:    Today we worked on issues related to her recurrent recurrent and severe depressive symptoms and difficulty coping with stressors along with insomnia.  Impression/Diagnosis:   The patient has had a number of significant emotional setbacks related to deaths in her family. Her mother died  been soon after that her father died. A year later husband died after deteriorating very suddenly. The patient reports that she became severely depressed and started having significant cognitive difficulties  associated with this depression anxiety.  Diagnosis:    Axis I: Major depressive disorder, single episode, severe without psychotic features

## 2015-02-17 ENCOUNTER — Encounter (HOSPITAL_COMMUNITY): Payer: Self-pay | Admitting: Psychology

## 2015-02-17 ENCOUNTER — Ambulatory Visit (INDEPENDENT_AMBULATORY_CARE_PROVIDER_SITE_OTHER): Payer: Managed Care, Other (non HMO) | Admitting: Psychology

## 2015-02-17 DIAGNOSIS — F322 Major depressive disorder, single episode, severe without psychotic features: Secondary | ICD-10-CM

## 2015-02-17 NOTE — Progress Notes (Signed)
Patient:  Rachel Sutton   DOB: 1961/07/29  MR Number: [id number]  Location: [facility] [address] [place] [address] Dept: [phone]  Start: 1 PM End: 2 PM  Provider/Observer:     Edgardo Roys PSYD  Chief Complaint:      Chief Complaint  Patient presents with  . Depression    Reason For Service:     The patient is a 54 year old old widowed femalewas referred by Dr. Harrington Challenger for psychotherapeutic interventions. The patient reports that she has been working at [employer] for some time but has been out of work with issues related to significant depression and difficulty coping. The patient reports that both of her parents been put in long-term hospital care and that her husband died this past September 02, 2023. The patient reports that she has had other stressors including her daughter and her daughter's new husband along with her 3 year old moving in with him. The patient reports that she is sleeping very poorly and she does not sleep at all unless she takes Xanax. She reports that she tosses and turns throughout the night.  The patient denies any significant prior history of psychiatric illness or treatment. She has suffered a number of recent family deaths. Her mother died of pancreatic cancer in April 01, 2012 and that her father died 22 months later. In 2013-04-01 her husband was diagnosed with myocardial infarction and COPD and he quickly deteriorated and died in 09-01-14. She reports significant deterioration in her overall functioning since her husband died. She describes significant problems with maintaining attention and concentration. She reports that her daughter and her son-in-law along with her 69 year old granddaughters moved in with them recently. She has not been able to work recently because of significant cognitive issues related to her underlying psychiatric depression anxiety.  this patient is a 54 year old  widowed white female who is currently living with her daughter son-in-law and 41 year old granddaughter in [place]. 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   Interventions Strategy:  Cognitive/behavioral psychotherapeutic interventions  Participation Level:   Active  Participation Quality:  Appropriate      Behavioral Observation:  Well Groomed, Alert, and Depressed.   Current Psychosocial Factors: The patient reports that she is now 2 weeks into her her return to work. She initially started part-time for a week and now is had to full-time weeks. She reports that this has gone very well and that her coworkers were very receptive of her and she is jumped right back into her normal routine at work. The patient reports that her depression symptoms have improved significantly. The patient reports that she continues to actively work on Radiographer, therapeutic and strategies..  Content of Session:   Review current symptoms and work on therapeutic interventions around severe depression that is exceeded the ramifications of simple bereavement.  Current Status:   The patient reports that she has had significant improvement in her mood status. She reports that she was doing better but it become very anxious as her return to work approach. However, she reports that it would better than she had. In the first week at part-time easily transitioned back into full-time activities. She reports that this turned out to actually further help her status getting back into her normal work pattern.  Patient Progress:   stable  Target Goals:   Target goals include reducing the intensity, severity, and duration of significant depressive symptoms.  Last  Reviewed:   01/13/2015  Goals Addressed Today:    Today we worked on issues related to her recurrent recurrent and severe depressive symptoms and difficulty coping with stressors along with insomnia.  Impression/Diagnosis:   The  patient has had a number of significant emotional setbacks related to deaths in her family. Her mother died been soon after that her father died. A year later husband died after deteriorating very suddenly. The patient reports that she became severely depressed and started having significant cognitive difficulties associated with this depression anxiety.  Diagnosis:    Axis I: Major depressive disorder, single episode, severe without psychotic features

## 2015-02-18 ENCOUNTER — Encounter (HOSPITAL_COMMUNITY): Payer: Self-pay | Admitting: Psychiatry

## 2015-02-18 ENCOUNTER — Ambulatory Visit (INDEPENDENT_AMBULATORY_CARE_PROVIDER_SITE_OTHER): Payer: Managed Care, Other (non HMO) | Admitting: Psychiatry

## 2015-02-18 VITALS — BP 108/75 | HR 75 | Ht 64.25 in | Wt 172.4 lb

## 2015-02-18 DIAGNOSIS — F322 Major depressive disorder, single episode, severe without psychotic features: Secondary | ICD-10-CM

## 2015-02-18 MED ORDER — PAROXETINE HCL 20 MG PO TABS: 20.0000 mg | ORAL_TABLET | Freq: Every day | ORAL | Status: DC

## 2015-02-18 MED ORDER — ALPRAZOLAM 1 MG PO TABS: 1.0000 mg | ORAL_TABLET | Freq: Four times a day (QID) | ORAL | Status: DC
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
## 2015-03-14 ENCOUNTER — Other Ambulatory Visit: Payer: Self-pay | Admitting: Cardiology

## 2015-03-23 ENCOUNTER — Ambulatory Visit (INDEPENDENT_AMBULATORY_CARE_PROVIDER_SITE_OTHER): Payer: Managed Care, Other (non HMO) | Admitting: Psychology

## 2015-03-23 ENCOUNTER — Encounter (HOSPITAL_COMMUNITY): Payer: Self-pay | Admitting: Psychology

## 2015-03-23 DIAGNOSIS — F322 Major depressive disorder, single episode, severe without psychotic features: Secondary | ICD-10-CM

## 2015-03-23 NOTE — Progress Notes (Signed)
Patient:  Rachel Sutton   DOB: Aug 30, 1961  MR Number: [id number]  Location: [facility] [address] [place] [address] Dept: [phone]  Start: 10AM End: 11 AM  Provider/Observer:     Edgardo Roys PSYD  Chief Complaint:      Chief Complaint  Patient presents with  . Depression    Reason For Service:     The patient is a 54 year old widowed femalewas referred by Dr. Harrington Challenger for psychotherapeutic interventions. The patient reports that she has been working at [employer] for some time but has been out of work with issues related to significant depression and difficulty coping. The patient reports that both of her parents been put in long-term hospital care and that her husband died this past 2023-08-26. The patient reports that she has had other stressors including her daughter and her daughter's new husband along with her 75 year old moving in with him. The patient reports that she is sleeping very poorly and she does not sleep at all unless she takes Xanax. She reports that she tosses and turns throughout the night.  The patient denies any significant prior history of psychiatric illness or treatment. She has suffered a number of recent family deaths. Her mother died of pancreatic cancer in 2012-03-25 and that her father died 65 months later. In 03-25-13 her husband was diagnosed with myocardial infarction and COPD and he quickly deteriorated and died in Aug 25, 2014. She reports significant deterioration in her overall functioning since her husband died. She describes significant problems with maintaining attention and concentration. She reports that her daughter and her son-in-law along with her 63 year old granddaughters moved in with them recently. She has not been able to work recently because of significant cognitive issues related to her underlying psychiatric depression anxiety.  this patient is a 54 year old  widowed white female who is currently living with her daughter son-in-law and 50 year old granddaughter in [place]. 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   Interventions Strategy:  Cognitive/behavioral psychotherapeutic interventions  Participation Level:   Active  Participation Quality:  Appropriate      Behavioral Observation:  Well Groomed, Alert, and Depressed.   Current Psychosocial Factors: The patient reports that she is continuing to do well with her job and it is also helping with her mood.  She is woking on spending more time with friends and family as well.  She is walking, eating better and doing sleep hygrine skills.  Content of Session:   Review current symptoms and work on therapeutic interventions around severe depression that is exceeded the ramifications of simple bereavement.  Current Status:   The patient reports that she has had continued significant improvement in her mood status. She reports that her return to work has gone very well and her anxiety and depression have all improved.  She is sleeping better and being out side for most sun rises and sun sets.  Patient Progress:   stable  Target Goals:   Target goals include reducing the intensity, severity, and duration of significant depressive symptoms.  Last Reviewed:   03/23/2015  Goals Addressed Today:    Today we worked on issues related to her recurrent recurrent and severe depressive symptoms and difficulty coping with stressors along with insomnia.  Impression/Diagnosis:   The patient has had a number of significant emotional setbacks related to deaths in her family. Her mother died been soon after that  her father died. A year later husband died after deteriorating very suddenly. The patient reports that she became severely depressed and started having significant cognitive difficulties associated with this depression anxiety.  Diagnosis:    Axis I: Major  depressive disorder, single episode, severe without psychotic features

## 2015-04-19 ENCOUNTER — Other Ambulatory Visit: Payer: Self-pay | Admitting: Cardiology

## 2015-04-20 ENCOUNTER — Encounter (HOSPITAL_COMMUNITY): Payer: Self-pay | Admitting: Psychiatry

## 2015-04-20 ENCOUNTER — Ambulatory Visit (INDEPENDENT_AMBULATORY_CARE_PROVIDER_SITE_OTHER): Payer: Managed Care, Other (non HMO) | Admitting: Psychiatry

## 2015-04-20 VITALS — BP 118/73 | HR 78 | Ht 64.0 in | Wt 176.8 lb

## 2015-04-20 DIAGNOSIS — F322 Major depressive disorder, single episode, severe without psychotic features: Secondary | ICD-10-CM

## 2015-04-20 DIAGNOSIS — F329 Major depressive disorder, single episode, unspecified: Secondary | ICD-10-CM | POA: Diagnosis not present

## 2015-04-20 MED ORDER — PAROXETINE HCL 20 MG PO TABS: 20.0000 mg | ORAL_TABLET | Freq: Every day | ORAL | Status: DC

## 2015-04-20 MED ORDER — ALPRAZOLAM 1 MG PO TABS: 1.0000 mg | ORAL_TABLET | Freq: Four times a day (QID) | ORAL | Status: DC
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
## 2015-04-21 ENCOUNTER — Ambulatory Visit (INDEPENDENT_AMBULATORY_CARE_PROVIDER_SITE_OTHER): Payer: Managed Care, Other (non HMO) | Admitting: Psychology

## 2015-04-21 DIAGNOSIS — F322 Major depressive disorder, single episode, severe without psychotic features: Secondary | ICD-10-CM | POA: Diagnosis not present

## 2015-05-18 ENCOUNTER — Telehealth (HOSPITAL_COMMUNITY): Payer: Self-pay | Admitting: *Deleted

## 2015-05-25 ENCOUNTER — Ambulatory Visit (HOSPITAL_COMMUNITY): Payer: Self-pay | Admitting: Psychology

## 2015-05-31 ENCOUNTER — Telehealth (HOSPITAL_COMMUNITY): Payer: Self-pay | Admitting: *Deleted

## 2015-05-31 NOTE — Telephone Encounter (Signed)
lmtcb on pt cell and number ptovided. Need to resch appt for 06-14-15

## 2015-06-02 ENCOUNTER — Telehealth (HOSPITAL_COMMUNITY): Payer: Self-pay | Admitting: *Deleted

## 2015-06-14 ENCOUNTER — Ambulatory Visit (HOSPITAL_COMMUNITY): Payer: Self-pay | Admitting: Psychology

## 2015-06-21 ENCOUNTER — Encounter (HOSPITAL_COMMUNITY): Payer: Self-pay | Admitting: Psychiatry

## 2015-06-21 ENCOUNTER — Ambulatory Visit (INDEPENDENT_AMBULATORY_CARE_PROVIDER_SITE_OTHER): Payer: Managed Care, Other (non HMO) | Admitting: Psychiatry

## 2015-06-21 VITALS — BP 129/73 | HR 89 | Ht 64.0 in | Wt 181.8 lb

## 2015-06-21 DIAGNOSIS — F329 Major depressive disorder, single episode, unspecified: Secondary | ICD-10-CM

## 2015-06-21 DIAGNOSIS — F322 Major depressive disorder, single episode, severe without psychotic features: Secondary | ICD-10-CM

## 2015-06-21 MED ORDER — PAROXETINE HCL 20 MG PO TABS: 30.0000 mg | ORAL_TABLET | Freq: Every day | ORAL | Status: DC

## 2015-06-21 MED ORDER — ALPRAZOLAM 1 MG PO TABS: 1.0000 mg | ORAL_TABLET | Freq: Four times a day (QID) | ORAL | Status: DC
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
## 2015-06-22 ENCOUNTER — Telehealth (HOSPITAL_COMMUNITY): Payer: Self-pay | Admitting: *Deleted

## 2015-06-22 NOTE — Telephone Encounter (Signed)
Pt pharmacy requesting refills for pt Xanax. Called pt and informed her and she stated that she mailed her printed Xanax script to her pharmacy today 06-22-15 ([place]).

## 2015-06-24 NOTE — Progress Notes (Signed)
Patient:  Rachel Sutton   DOB: December 14, 1961  MR Number: [id number]  Location: [facility] [address] [place] [address] Dept: [phone]  Start: 10AM End: 11 AM  Provider/Observer:     Edgardo Roys PSYD  Chief Complaint:      Chief Complaint  Patient presents with  . Depression    Reason For Service:     The patient is a 54 year old widowed femalewas referred by Dr. Harrington Challenger for psychotherapeutic interventions. The patient reports that she has been working at [employer] for some time but has been out of work with issues related to significant depression and difficulty coping. The patient reports that both of her parents been put in long-term hospital care and that her husband died this past 2023/08/22. The patient reports that she has had other stressors including her daughter and her daughter's new husband along with her 38 year old moving in with him. The patient reports that she is sleeping very poorly and she does not sleep at all unless she takes Xanax. She reports that she tosses and turns throughout the night.  The patient denies any significant prior history of psychiatric illness or treatment. She has suffered a number of recent family deaths. Her mother died of pancreatic cancer in 2012/03/21 and that her father died 17 months later. In 03-21-2013 her husband was diagnosed with myocardial infarction and COPD and he quickly deteriorated and died in 21-Aug-2014. She reports significant deterioration in her overall functioning since her husband died. She describes significant problems with maintaining attention and concentration. She reports that her daughter and her son-in-law along with her 24 year old granddaughters moved in with them recently. She has not been able to work recently because of significant cognitive issues related to her underlying psychiatric depression anxiety.  this patient is a 54 year old  widowed white female who is currently living with her daughter son-in-law and 36 year old granddaughter in [place]. 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   Interventions Strategy:  Cognitive/behavioral psychotherapeutic interventions  Participation Level:   Active  Participation Quality:  Appropriate      Behavioral Observation:  Well Groomed, Alert, and Depressed.   Current Psychosocial Factors: The patient reports that she is continuing to do well with her job and it is also helping with her mood.  She is woking on spending more time with friends and family as well.  She is walking, eating better and doing sleep hygrine skills.  Content of Session:   Review current symptoms and work on therapeutic interventions around severe depression that is exceeded the ramifications of simple bereavement.  Current Status:   The patient reports that she has had continued significant improvement in her mood status. She reports that her return to work has gone very well and her anxiety and depression have all improved.  She is sleeping better and being out side for most sun rises and sun sets.  Patient Progress:   stable  Target Goals:   Target goals include reducing the intensity, severity, and duration of significant depressive symptoms.  Last Reviewed:   04/21/2015  Goals Addressed Today:    Today we worked on issues related to her recurrent recurrent and severe depressive symptoms and difficulty coping with stressors along with insomnia.  Impression/Diagnosis:   The patient has had a number of significant emotional setbacks related to deaths in her family. Her mother died been soon after that  her father died. A year later husband died after deteriorating very suddenly. The patient reports that she became severely depressed and started having significant cognitive difficulties associated with this depression anxiety.  Diagnosis:    Axis I: Major  depressive disorder, single episode, severe without psychotic features

## 2015-06-28 ENCOUNTER — Other Ambulatory Visit: Payer: Self-pay

## 2015-06-28 ENCOUNTER — Telehealth (HOSPITAL_COMMUNITY): Payer: Self-pay | Admitting: *Deleted

## 2015-06-28 DIAGNOSIS — E0590 Thyrotoxicosis, unspecified without thyrotoxic crisis or storm: Secondary | ICD-10-CM

## 2015-06-28 MED ORDER — METOPROLOL SUCCINATE ER 50 MG PO TB24: 50.0000 mg | ORAL_TABLET | Freq: Every day | ORAL | Status: DC

## 2015-06-28 NOTE — Telephone Encounter (Signed)
FAX REQUEST FOR REFILL AUTHORIZATION FOR XANAX Q MG. PILL

## 2015-06-29 ENCOUNTER — Other Ambulatory Visit (HOSPITAL_COMMUNITY): Payer: Self-pay | Admitting: *Deleted

## 2015-06-29 NOTE — Telephone Encounter (Signed)
Refill for xanax requested but was filled 06/21/15. No longer required.

## 2015-07-05 ENCOUNTER — Ambulatory Visit (INDEPENDENT_AMBULATORY_CARE_PROVIDER_SITE_OTHER): Payer: Managed Care, Other (non HMO) | Admitting: Psychology

## 2015-07-05 ENCOUNTER — Telehealth (HOSPITAL_COMMUNITY): Payer: Self-pay | Admitting: *Deleted

## 2015-07-05 DIAGNOSIS — F322 Major depressive disorder, single episode, severe without psychotic features: Secondary | ICD-10-CM | POA: Diagnosis not present

## 2015-07-05 NOTE — Telephone Encounter (Signed)
Refill request for Xanax. Chart reviewed, not appropriate. Given 06/21/15 with 2 refills. Called to notify pharmacy. They will reach out to patient to find out if she is mailing it to them or filling at a local pharmacy.

## 2015-07-05 NOTE — Progress Notes (Signed)
Patient:  Rachel Sutton   DOB: 1961-06-13  MR Number: [id number]  Location: [facility] [address] [place] [address] Dept: [phone]  Start: 3 PM End: 4 PM  Provider/Observer:     Edgardo Roys PSYD  Chief Complaint:      Chief Complaint  Patient presents with  . Depression    Reason For Service:     The patient is a 54 year old widowed femalewas referred by Dr. Harrington Challenger for psychotherapeutic interventions. The patient reports that she has been working at [employer] for some time but has been out of work with issues related to significant depression and difficulty coping. The patient reports that both of her parents been put in long-term hospital care and that her husband died this past 08/24/2023. The patient reports that she has had other stressors including her daughter and her daughter's new husband along with her 32 year old moving in with him. The patient reports that she is sleeping very poorly and she does not sleep at all unless she takes Xanax. She reports that she tosses and turns throughout the night.  The patient denies any significant prior history of psychiatric illness or treatment. She has suffered a number of recent family deaths. Her mother died of pancreatic cancer in Mar 23, 2012 and that her father died 38 months later. In 2013/03/23 her husband was diagnosed with myocardial infarction and COPD and he quickly deteriorated and died in 23-Aug-2014. She reports significant deterioration in her overall functioning since her husband died. She describes significant problems with maintaining attention and concentration. She reports that her daughter and her son-in-law along with her 71 year old granddaughters moved in with them recently. She has not been able to work recently because of significant cognitive issues related to her underlying psychiatric depression anxiety.  this patient is a 54 year old  widowed white female who is currently living with her daughter son-in-law and 54 year old granddaughter in [place]. 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   Interventions Strategy:  Cognitive/behavioral psychotherapeutic interventions  Participation Level:   Active  Participation Quality:  Appropriate      Behavioral Observation:  Well Groomed, Alert, and Depressed.   Current Psychosocial Factors: The patient reports that she is continuing to do much better   Content of Session:   Review current symptoms and work on therapeutic interventions around severe depression that is exceeded the ramifications of simple bereavement.  Current Status:   The patient reports that she has had continued significant improvement in her mood status. She reports that her return to work has gone very well and her anxiety and depression have all improved.  She is sleeping better and being out side for most sun rises and sun sets.  Patient Progress:   stable  Target Goals:   Target goals include reducing the intensity, severity, and duration of significant depressive symptoms.  Last Reviewed:   07/05/2015  Goals Addressed Today:    Today we worked on issues related to her recurrent recurrent and severe depressive symptoms and difficulty coping with stressors along with insomnia.  Impression/Diagnosis:   The patient has had a number of significant emotional setbacks related to deaths in her family. Her mother died been soon after that her father died. A year later husband died after deteriorating very suddenly. The patient reports that she became severely depressed and started having significant cognitive difficulties associated with this depression anxiety.  Diagnosis:  Axis I: Major depressive disorder, single episode, severe without psychotic features

## 2015-07-26 ENCOUNTER — Encounter: Payer: Self-pay | Admitting: Cardiology

## 2015-07-28 ENCOUNTER — Ambulatory Visit (INDEPENDENT_AMBULATORY_CARE_PROVIDER_SITE_OTHER): Payer: Managed Care, Other (non HMO) | Admitting: Psychiatry

## 2015-07-28 ENCOUNTER — Encounter (HOSPITAL_COMMUNITY): Payer: Self-pay | Admitting: Psychiatry

## 2015-07-28 VITALS — BP 128/82 | HR 84 | Ht 64.0 in | Wt 184.2 lb

## 2015-07-28 DIAGNOSIS — F322 Major depressive disorder, single episode, severe without psychotic features: Secondary | ICD-10-CM

## 2015-07-28 MED ORDER — ALPRAZOLAM 1 MG PO TABS: 1.0000 mg | ORAL_TABLET | Freq: Four times a day (QID) | ORAL | Status: DC

## 2015-07-28 MED ORDER — PAROXETINE HCL 20 MG PO TABS: 30.0000 mg | ORAL_TABLET | Freq: Every day | ORAL | Status: DC
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
## 2015-08-18 ENCOUNTER — Ambulatory Visit (INDEPENDENT_AMBULATORY_CARE_PROVIDER_SITE_OTHER): Payer: Managed Care, Other (non HMO) | Admitting: Psychology

## 2015-08-18 ENCOUNTER — Encounter (HOSPITAL_COMMUNITY): Payer: Self-pay | Admitting: Psychology

## 2015-08-18 DIAGNOSIS — F322 Major depressive disorder, single episode, severe without psychotic features: Secondary | ICD-10-CM

## 2015-08-18 NOTE — Progress Notes (Signed)
Patient:  Rachel Sutton   DOB: 1961-12-13  MR Number: [id number]  Location: [facility] [address] [place] [address] Dept: [phone]  Start: 4 PM End: 5 PM  Provider/Observer:     Edgardo Roys PSYD  Chief Complaint:      Chief Complaint  Patient presents with  . Depression    Reason For Service:     The patient is a 54 year old widowed femalewas referred by Dr. Harrington Challenger for psychotherapeutic interventions. The patient reports that she has been working at [employer] for some time but has been out of work with issues related to significant depression and difficulty coping. The patient reports that both of her parents been put in long-term hospital care and that her husband died this past 2023/08/16. The patient reports that she has had other stressors including her daughter and her daughter's new husband along with her 54 year old moving in with him. The patient reports that she is sleeping very poorly and she does not sleep at all unless she takes Xanax. She reports that she tosses and turns throughout the night.  The patient denies any significant prior history of psychiatric illness or treatment. She has suffered a number of recent family deaths. Her mother died of pancreatic cancer in 04/15/2012 and that her father died 44 months later. In 04-15-13 her husband was diagnosed with myocardial infarction and COPD and he quickly deteriorated and died in 15-Aug-2014. She reports significant deterioration in her overall functioning since her husband died. She describes significant problems with maintaining attention and concentration. She reports that her daughter and her son-in-law along with her 54 year old granddaughters moved in with them recently. She has not been able to work recently because of significant cognitive issues related to her underlying psychiatric depression anxiety.  this patient is a 54 year old  54 year old widowed white female who is currently living with her daughter son-in-law and 54 year old granddaughter in [place]. 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   Interventions Strategy:  Cognitive/behavioral psychotherapeutic interventions  Participation Level:   Active  Participation Quality:  Appropriate      Behavioral Observation:  Well Groomed, Alert, and Depressed.   Current Psychosocial Factors: The patient reports that she is continuing to do much better both at work and at home.  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  Content of Session:   Review current symptoms and work on therapeutic interventions around severe depression that is exceeded the ramifications of simple bereavement.  Current Status:   The patient reports that she has continued to do well and is to the point that we will not make a regularly scheduled appointment.  If she does start to do poorly again I will continue to be available in the future.  Patient Progress:   stable  Target Goals:   Target goals include reducing the intensity, severity, and duration of significant depressive symptoms.  Last Reviewed:   08/18/2015  Goals Addressed Today:    Today we worked on issues related to her recurrent recurrent and severe depressive symptoms and difficulty coping with stressors along with insomnia.  Impression/Diagnosis:   The patient has had a number of significant emotional setbacks related to deaths in her family. Her mother died been soon after that her father died. A year later husband died after deteriorating very suddenly. The patient reports that she became severely  depressed and started having significant cognitive difficulties associated with this depression anxiety.  Diagnosis:    Axis I: Major depressive disorder, single episode, severe without psychotic features

## 2015-08-23 ENCOUNTER — Other Ambulatory Visit: Payer: Self-pay | Admitting: *Deleted

## 2015-08-23 ENCOUNTER — Ambulatory Visit: Payer: Self-pay | Admitting: Cardiology

## 2015-08-23 MED ORDER — NIFEDIPINE ER OSMOTIC RELEASE 90 MG PO TB24: 90.0000 mg | ORAL_TABLET | Freq: Every day | ORAL | Status: DC

## 2015-08-25 ENCOUNTER — Ambulatory Visit (INDEPENDENT_AMBULATORY_CARE_PROVIDER_SITE_OTHER): Payer: Managed Care, Other (non HMO) | Admitting: Cardiology

## 2015-08-25 ENCOUNTER — Encounter: Payer: Self-pay | Admitting: Cardiology

## 2015-08-25 VITALS — BP 106/70 | HR 85 | Ht 64.0 in | Wt 185.0 lb

## 2015-08-25 DIAGNOSIS — I10 Essential (primary) hypertension: Secondary | ICD-10-CM

## 2015-08-25 DIAGNOSIS — I341 Nonrheumatic mitral (valve) prolapse: Secondary | ICD-10-CM

## 2015-08-25 MED ORDER — METOPROLOL SUCCINATE ER 50 MG PO TB24: ORAL_TABLET | ORAL | Status: DC

## 2015-08-25 NOTE — Patient Instructions (Signed)
Your physician wants you to follow-up in: 6 months Dr Ferne Reus will receive a reminder letter in the mail two months in advance. If you don't receive a letter, please call our office to schedule the follow-up appointment.    You may take Toprol XL 75 mg daily      Thank you for choosing [facility] !

## 2015-08-25 NOTE — Progress Notes (Signed)
Cardiology Office Note  Date: 08/25/2015   ID: KATARA GRINER, DOB [birth year], MRN [id number]  PCP: Alonza Bogus, MD  Primary Cardiologist: Rozann Lesches, MD   Chief Complaint  Patient presents with  . Mitral valve disease    History of Present Illness: Rachel Sutton is a 54 y.o. female last seen in February. She presents for a routine follow-up visit. Reports good control of palpitations since we increased Toprol-XL to 75 mg daily. She does tell me about an episode of syncope that she had back in July, etiology not clear, but sounds like it may have been hypotension induced based on her description. She has had no further events.  Follow-up echocardiogram from February of this year as outlined below. We discussed the results.   Past Medical History  Diagnosis Date  . Fuchs' corneal dystrophy   . Essential hypertension, benign   . Mixed hyperlipidemia   . Mitral valve prolapse     Mild mitral regurgitation  . GERD (gastroesophageal reflux disease)   . Arthritis   . Anxiety   . Breast cancer 02/22/2012    Right T1c, N0  . Depression     Current Outpatient Prescriptions  Medication Sig Dispense Refill  . ALPRAZolam (XANAX) 1 MG tablet Take 1 tablet (1 mg total) by mouth 4 (four) times daily. 120 tablet 2  . aspirin EC 81 MG EC tablet Take 1 tablet (81 mg total) by mouth daily.    . Calcium-Magnesium-Vitamin D (CALCIUM 500 PO) Take 1 tablet by mouth 2 (two) times daily.    Marland Kitchen EPINEPHrine (EPI-PEN) 0.3 mg/0.3 mL DEVI Inject 0.3 mg into the muscle as needed. For bee venom    . fish oil-omega-3 fatty acids 1000 MG capsule Take 2 g by mouth every morning.    . hydrochlorothiazide (MICROZIDE) 12.5 MG capsule TAKE 1 CAPSULE BY MOUTH EVERY DAY 90 capsule 1  . Multiple Vitamin (MULITIVITAMIN WITH MINERALS) TABS Take 1 tablet by mouth every morning.    . naproxen sodium (ANAPROX) 220 MG tablet Take 220 mg by mouth as needed. 1-2 TABLETS DAILY    . NIFEdipine (PROCARDIA  XL/ADALAT-CC) 90 MG 24 hr tablet Take 1 tablet (90 mg total) by mouth daily. 90 tablet 3  . nitroGLYCERIN (NITROSTAT) 0.4 MG SL tablet Place 1 tablet (0.4 mg total) under the tongue every 5 (five) minutes x 3 doses as needed for chest pain. 25 tablet 3  . pantoprazole (PROTONIX) 40 MG tablet TAKE 1 TABLET BY MOUTH EVERY DAY 30 tablet 11  . PARoxetine (PAXIL) 20 MG tablet Take 1.5 tablets (30 mg total) by mouth daily. 135 tablet 2  . simvastatin (ZOCOR) 40 MG tablet Take 40 mg by mouth every morning.     . sodium chloride (MURO 128) 5 % ophthalmic solution 1 drop as needed.    . traMADol (ULTRAM) 50 MG tablet Take 50 mg by mouth every 6 (six) hours as needed.     No current facility-administered medications for this visit.    Allergies:  Bee venom; Haloperidol lactate; and Meperidine hcl   Social History: The patient  reports that she has been smoking Cigarettes.  She started smoking about 41 years ago. She has a 8.75 pack-year smoking history. She has never used smokeless tobacco. She reports that she drinks alcohol. She reports that she does not use illicit drugs.   ROS:  Please see the history of present illness. Otherwise, complete review of systems is positive for none.  All  other systems are reviewed and negative.   Physical Exam: VS:  BP 106/70 mmHg  Pulse 85  Ht 5\' 4"  (1.626 m)  Wt 185 lb (83.915 kg)  BMI 31.74 kg/m2  SpO2 95%, BMI Body mass index is 31.74 kg/(m^2).  Wt Readings from Last 3 Encounters:  08/25/15 185 lb (83.915 kg)  07/28/15 184 lb 3.2 oz (83.553 kg)  06/21/15 181 lb 12.8 oz (82.464 kg)     She appears comfortable at rest. HEENT: Conjunctiva and lids normal, oropharynx clear  Neck: Supple, no elevated JVP or carotid bruits, no thyromegaly.  Lungs: Clear to auscultation, nonlabored breathing at rest.  Cardiac: Regular rate and rhythm, no S3 or significant systolic murmur, no pericardial rub.  Abdomen: Soft, nontender, bowel sounds present.  Extremities:  No pitting edema, distal pulses 2+.   ECG: ECG is not ordered today.  Other Studies Reviewed Today:  Echocardiogram 02/03/2015: Study Conclusions  - Left ventricle: The cavity size was normal. Wall thickness was normal. Systolic function was normal. The estimated ejection fraction was in the range of 60% to 65%. Wall motion was normal; there were no regional wall motion abnormalities. There was an increased relative contribution of atrial contraction to ventricular filling. - Mitral valve: Mildly calcified annulus. Mildly thickened leaflets . Mild posterior leaflet prolapse. There was mild regurgitation. - Left atrium: The atrium was mildly dilated. Volume/bsa, S: 29.3 ml/m^2.  ASSESSMENT AND PLAN:  1. Mitral valve prolapse with mild mitral regurgitation, stable echocardiogram last year. Palpitations have also been stable, no change to current regimen.  2. Essential hypertension, blood pressure is normal today.  Current medicines were reviewed at length with the patient today.  Disposition: FU with me in 6 months.   Signed, Satira Sark, MD, [facility] 08/25/2015 1:40 PM    [facility] [address], [place], [place] [address] Phone: [phone]; Fax: [phone]

## 2015-09-17 ENCOUNTER — Emergency Department (HOSPITAL_COMMUNITY)
Admission: EM | Admit: 2015-09-17 | Discharge: 2015-09-17 | Disposition: A | Discharge status: Home / Self Care | Payer: Managed Care, Other (non HMO) | Attending: Emergency Medicine | Admitting: Emergency Medicine

## 2015-09-17 ENCOUNTER — Encounter (HOSPITAL_COMMUNITY): Payer: Self-pay | Admitting: *Deleted

## 2015-09-17 ENCOUNTER — Emergency Department (HOSPITAL_COMMUNITY): Payer: Managed Care, Other (non HMO)

## 2015-09-17 DIAGNOSIS — F329 Major depressive disorder, single episode, unspecified: Secondary | ICD-10-CM | POA: Diagnosis not present

## 2015-09-17 DIAGNOSIS — Z8669 Personal history of other diseases of the nervous system and sense organs: Secondary | ICD-10-CM | POA: Diagnosis not present

## 2015-09-17 DIAGNOSIS — R197 Diarrhea, unspecified: Secondary | ICD-10-CM | POA: Insufficient documentation

## 2015-09-17 DIAGNOSIS — F419 Anxiety disorder, unspecified: Secondary | ICD-10-CM | POA: Insufficient documentation

## 2015-09-17 DIAGNOSIS — Z853 Personal history of malignant neoplasm of breast: Secondary | ICD-10-CM | POA: Diagnosis not present

## 2015-09-17 DIAGNOSIS — K219 Gastro-esophageal reflux disease without esophagitis: Secondary | ICD-10-CM | POA: Diagnosis not present

## 2015-09-17 DIAGNOSIS — R112 Nausea with vomiting, unspecified: Secondary | ICD-10-CM | POA: Insufficient documentation

## 2015-09-17 DIAGNOSIS — R1084 Generalized abdominal pain: Secondary | ICD-10-CM | POA: Diagnosis not present

## 2015-09-17 DIAGNOSIS — I10 Essential (primary) hypertension: Secondary | ICD-10-CM | POA: Diagnosis not present

## 2015-09-17 DIAGNOSIS — R000 Tachycardia, unspecified: Secondary | ICD-10-CM | POA: Insufficient documentation

## 2015-09-17 DIAGNOSIS — R1110 Vomiting, unspecified: Secondary | ICD-10-CM

## 2015-09-17 DIAGNOSIS — M1990 Unspecified osteoarthritis, unspecified site: Secondary | ICD-10-CM | POA: Insufficient documentation

## 2015-09-17 DIAGNOSIS — Z720 Tobacco use: Secondary | ICD-10-CM | POA: Diagnosis not present

## 2015-09-17 DIAGNOSIS — E782 Mixed hyperlipidemia: Secondary | ICD-10-CM | POA: Insufficient documentation

## 2015-09-17 DIAGNOSIS — Z791 Long term (current) use of non-steroidal anti-inflammatories (NSAID): Secondary | ICD-10-CM | POA: Diagnosis not present

## 2015-09-17 DIAGNOSIS — Z79899 Other long term (current) drug therapy: Secondary | ICD-10-CM | POA: Diagnosis not present

## 2015-09-17 DIAGNOSIS — Z7982 Long term (current) use of aspirin: Secondary | ICD-10-CM | POA: Diagnosis not present

## 2015-09-17 LAB — COMPREHENSIVE METABOLIC PANEL
ALT: 48 U/L (ref 14–54)
AST: 48 U/L — ABNORMAL HIGH (ref 15–41)
Albumin: 4.2 g/dL (ref 3.5–5.0)
Alkaline Phosphatase: 80 U/L (ref 38–126)
Anion gap: 10 (ref 5–15)
BUN: 18 mg/dL (ref 6–20)
CO2: 23 mmol/L (ref 22–32)
Calcium: 8.8 mg/dL — ABNORMAL LOW (ref 8.9–10.3)
Chloride: 104 mmol/L (ref 101–111)
Creatinine, Ser: 0.72 mg/dL (ref 0.44–1.00)
GFR calc Af Amer: >60 mL/min (ref >60)
GFR calc non Af Amer: >60 mL/min (ref >60)
Glucose, Bld: 99 mg/dL (ref 65–99)
Potassium: 3.4 mmol/L — ABNORMAL LOW (ref 3.5–5.1)
Sodium: 137 mmol/L (ref 135–145)
Total Bilirubin: 0.7 mg/dL (ref 0.3–1.2)
Total Protein: 7.6 g/dL (ref 6.5–8.1)

## 2015-09-17 LAB — URINE MICROSCOPIC-ADD ON

## 2015-09-17 LAB — CBC
HCT: 39.6 % (ref 36.0–46.0)
Hemoglobin: 14 g/dL (ref 12.0–15.0)
MCH: 31 pg (ref 26.0–34.0)
MCHC: 35.4 g/dL (ref 30.0–36.0)
MCV: 87.6 fL (ref 78.0–100.0)
Platelets: 346 10*3/uL (ref 150–400)
RBC: 4.52 MIL/uL (ref 3.87–5.11)
RDW: 13 % (ref 11.5–15.5)
WBC: 12.3 10*3/uL — ABNORMAL HIGH (ref 4.0–10.5)

## 2015-09-17 LAB — URINALYSIS, ROUTINE W REFLEX MICROSCOPIC
Bilirubin Urine: NEGATIVE
Glucose, UA: NEGATIVE mg/dL
Hgb urine dipstick: NEGATIVE
Ketones, ur: NEGATIVE mg/dL
Nitrite: NEGATIVE
Protein, ur: NEGATIVE mg/dL
Specific Gravity, Urine: 1.01 (ref 1.005–1.030)
Urobilinogen, UA: 0.2 mg/dL (ref 0.0–1.0)
pH: 6 (ref 5.0–8.0)

## 2015-09-17 LAB — LIPASE, BLOOD: Lipase: 47 U/L (ref 22–51)

## 2015-09-17 MED ORDER — LOPERAMIDE HCL 2 MG PO CAPS: 2.0000 mg | ORAL_CAPSULE | Freq: Four times a day (QID) | ORAL | Status: DC | PRN

## 2015-09-17 MED ORDER — ONDANSETRON HCL 4 MG/2ML IJ SOLN
4.0000 mg | Freq: Once | INTRAMUSCULAR | Status: AC
  Administered 2015-09-17: 4 mg via INTRAVENOUS
  Filled 2015-09-17: qty 2

## 2015-09-17 MED ORDER — SODIUM CHLORIDE 0.9 % IJ SOLN
INTRAMUSCULAR | Status: AC
  Filled 2015-09-17: qty 30

## 2015-09-17 MED ORDER — IOHEXOL 300 MG/ML  SOLN
25.0000 mL | Freq: Once | INTRAMUSCULAR | Status: AC | PRN
  Administered 2015-09-17: 25 mL via ORAL

## 2015-09-17 MED ORDER — IOHEXOL 300 MG/ML  SOLN
100.0000 mL | Freq: Once | INTRAMUSCULAR | Status: AC | PRN
  Administered 2015-09-17: 100 mL via INTRAVENOUS

## 2015-09-17 MED ORDER — KETOROLAC TROMETHAMINE 30 MG/ML IJ SOLN
30.0000 mg | Freq: Once | INTRAMUSCULAR | Status: AC
  Administered 2015-09-17: 30 mg via INTRAVENOUS
  Filled 2015-09-17: qty 1

## 2015-09-17 MED ORDER — PROMETHAZINE HCL 25 MG PO TABS: 25.0000 mg | ORAL_TABLET | Freq: Four times a day (QID) | ORAL | Status: DC | PRN

## 2015-09-17 MED ORDER — MORPHINE SULFATE (PF) 4 MG/ML IV SOLN
4.0000 mg | Freq: Once | INTRAVENOUS | Status: AC
  Administered 2015-09-17: 4 mg via INTRAVENOUS
  Filled 2015-09-17: qty 1

## 2015-09-17 MED ORDER — SODIUM CHLORIDE 0.9 % IV BOLUS (SEPSIS)
1000.0000 mL | Freq: Once | INTRAVENOUS | Status: AC
Start: 2015-09-17 — End: 2015-09-17
  Administered 2015-09-17: 1000 mL via INTRAVENOUS

## 2015-09-17 MED ORDER — DICYCLOMINE HCL 20 MG PO TABS: 20.0000 mg | ORAL_TABLET | Freq: Two times a day (BID) | ORAL | Status: DC

## 2015-09-17 NOTE — Discharge Instructions (Signed)
Abdominal Pain °Many things can cause abdominal pain. Usually, abdominal pain is not caused by a disease and will improve without treatment. It can often be observed and treated at home. Your health care provider will do a physical exam and possibly order blood tests and X-rays to help determine the seriousness of your pain. However, in many cases, more time must pass before a clear cause of the pain can be found. Before that point, your health care provider may not know if you need more testing or further treatment. °HOME CARE INSTRUCTIONS  °Monitor your abdominal pain for any changes. The following actions may help to alleviate any discomfort you are experiencing: °· Only take over-the-counter or prescription medicines as directed by your health care provider. °· Do not take laxatives unless directed to do so by your health care provider. °· Try a clear liquid diet (broth, tea, or water) as directed by your health care provider. Slowly move to a bland diet as tolerated. °SEEK MEDICAL CARE IF: °· You have unexplained abdominal pain. °· You have abdominal pain associated with nausea or diarrhea. °· You have pain when you urinate or have a bowel movement. °· You experience abdominal pain that wakes you in the night. °· You have abdominal pain that is worsened or improved by eating food. °· You have abdominal pain that is worsened with eating fatty foods. °· You have a fever. °SEEK IMMEDIATE MEDICAL CARE IF:  °· Your pain does not go away within 2 hours. °· You keep throwing up (vomiting). °· Your pain is felt only in portions of the abdomen, such as the right side or the left lower portion of the abdomen. °· You pass bloody or black tarry stools. °MAKE SURE YOU: °· Understand these instructions.   °· Will watch your condition.   °· Will get help right away if you are not doing well or get worse.   °Document Released: 09/12/2005 Document Revised: 12/08/2013 Document Reviewed: 08/12/2013 °[employer]® Patient Information  ©2015 [employer]. This information is not intended to replace advice given to you by your health care provider. Make sure you discuss any questions you have with your health care provider. ° °Nausea and Vomiting °Nausea is a sick feeling that often comes before throwing up (vomiting). Vomiting is a reflex where stomach contents come out of your mouth. Vomiting can cause severe loss of body fluids (dehydration). Children and elderly adults can become dehydrated quickly, especially if they also have diarrhea. Nausea and vomiting are symptoms of a condition or disease. It is important to find the cause of your symptoms. °CAUSES  °· Direct irritation of the stomach lining. This irritation can result from increased acid production (gastroesophageal reflux disease), infection, food poisoning, taking certain medicines (such as nonsteroidal anti-inflammatory drugs), alcohol use, or tobacco use. °· Signals from the brain. These signals could be caused by a headache, heat exposure, an inner ear disturbance, increased pressure in the brain from injury, infection, a tumor, or a concussion, pain, emotional stimulus, or metabolic problems. °· An obstruction in the gastrointestinal tract (bowel obstruction). °· Illnesses such as diabetes, hepatitis, gallbladder problems, appendicitis, kidney problems, cancer, sepsis, atypical symptoms of a heart attack, or eating disorders. °· Medical treatments such as chemotherapy and radiation. °· Receiving medicine that makes you sleep (general anesthetic) during surgery. °DIAGNOSIS °Your caregiver may ask for tests to be done if the problems do not improve after a few days. Tests may also be done if symptoms are severe or if the reason for the nausea   and vomiting is not clear. Tests may include:  Urine tests.  Blood tests.  Stool tests.  Cultures (to look for evidence of infection).  X-rays or other imaging studies. Test results can help your caregiver make decisions about  treatment or the need for additional tests. TREATMENT You need to stay well hydrated. Drink frequently but in small amounts.You may wish to drink water, sports drinks, clear broth, or eat frozen ice pops or gelatin dessert to help stay hydrated.When you eat, eating slowly may help prevent nausea.There are also some antinausea medicines that may help prevent nausea. HOME CARE INSTRUCTIONS   Take all medicine as directed by your caregiver.  If you do not have an appetite, do not force yourself to eat. However, you must continue to drink fluids.  If you have an appetite, eat a normal diet unless your caregiver tells you differently.  Eat a variety of complex carbohydrates (rice, wheat, potatoes, bread), lean meats, yogurt, fruits, and vegetables.  Avoid high-fat foods because they are more difficult to digest.  Drink enough water and fluids to keep your urine clear or pale yellow.  If you are dehydrated, ask your caregiver for specific rehydration instructions. Signs of dehydration may include:  Severe thirst.  Dry lips and mouth.  Dizziness.  Dark urine.  Decreasing urine frequency and amount.  Confusion.  Rapid breathing or pulse. SEEK IMMEDIATE MEDICAL CARE IF:   You have blood or brown flecks (like coffee grounds) in your vomit.  You have black or bloody stools.  You have a severe headache or stiff neck.  You are confused.  You have severe abdominal pain.  You have chest pain or trouble breathing.  You do not urinate at least once every 8 hours.  You develop cold or clammy skin.  You continue to vomit for longer than 24 to 48 hours.  You have a fever. MAKE SURE YOU:   Understand these instructions.  Will watch your condition.  Will get help right away if you are not doing well or get worse. Document Released: 12/03/2005 Document Revised: 02/25/2012 Document Reviewed: 05/02/2011 [facility] Patient Information 2015 [place], [place]. This information is not  intended to replace advice given to you by your health care provider. Make sure you discuss any questions you have with your health care provider. Diarrhea Diarrhea is frequent loose and watery bowel movements. It can cause you to feel weak and dehydrated. Dehydration can cause you to become tired and thirsty, have a dry mouth, and have decreased urination that often is dark yellow. Diarrhea is a sign of another problem, most often an infection that will not last long. In most cases, diarrhea typically lasts 2-3 days. However, it can last longer if it is a sign of something more serious. It is important to treat your diarrhea as directed by your caregiver to lessen or prevent future episodes of diarrhea. CAUSES  Some common causes include:  Gastrointestinal infections caused by viruses, bacteria, or parasites.  Food poisoning or food allergies.  Certain medicines, such as antibiotics, chemotherapy, and laxatives.  Artificial sweeteners and fructose.  Digestive disorders. HOME CARE INSTRUCTIONS  Ensure adequate fluid intake (hydration): Have 1 cup (8 oz) of fluid for each diarrhea episode. Avoid fluids that contain simple sugars or sports drinks, fruit juices, whole milk products, and sodas. Your urine should be clear or pale yellow if you are drinking enough fluids. Hydrate with an oral rehydration solution that you can purchase at pharmacies, retail stores, and online. You can  prepare an oral rehydration solution at home by mixing the following ingredients together:   - tsp table salt.   tsp baking soda.   tsp salt substitute containing potassium chloride.  1  tablespoons sugar.  1 L (34 oz) of water.  Certain foods and beverages may increase the speed at which food moves through the gastrointestinal (GI) tract. These foods and beverages should be avoided and include:  Caffeinated and alcoholic beverages.  High-fiber foods, such as raw fruits and vegetables, nuts, seeds, and whole  grain breads and cereals.  Foods and beverages sweetened with sugar alcohols, such as xylitol, sorbitol, and mannitol.  Some foods may be well tolerated and may help thicken stool including:  Starchy foods, such as rice, toast, pasta, low-sugar cereal, oatmeal, grits, baked potatoes, crackers, and bagels.  Bananas.  Applesauce.  Add probiotic-rich foods to help increase healthy bacteria in the GI tract, such as yogurt and fermented milk products.  Wash your hands well after each diarrhea episode.  Only take over-the-counter or prescription medicines as directed by your caregiver.  Take a warm bath to relieve any burning or pain from frequent diarrhea episodes. SEEK IMMEDIATE MEDICAL CARE IF:   You are unable to keep fluids down.  You have persistent vomiting.  You have blood in your stool, or your stools are black and tarry.  You do not urinate in 6-8 hours, or there is only a small amount of very dark urine.  You have abdominal pain that increases or localizes.  You have weakness, dizziness, confusion, or light-headedness.  You have a severe headache.  Your diarrhea gets worse or does not get better.  You have a fever or persistent symptoms for more than 2-3 days.  You have a fever and your symptoms suddenly get worse. MAKE SURE YOU:   Understand these instructions.  Will watch your condition.  Will get help right away if you are not doing well or get worse. Document Released: 11/23/2002 Document Revised: 04/19/2014 Document Reviewed: 08/10/2012 [facility] Patient Information 2015 [place], [place]. This information is not intended to replace advice given to you by your health care provider. Make sure you discuss any questions you have with your health care provider.

## 2015-09-17 NOTE — ED Notes (Signed)
Abdomen distended and tender to touch, primarily on left side.

## 2015-09-17 NOTE — ED Provider Notes (Signed)
CSN: [id number]     Arrival date & time 09/17/15  1744 History   First MD Initiated Contact with Patient 09/17/15 1753     Chief Complaint  Patient presents with  . Emesis     (Consider location/radiation/quality/duration/timing/severity/associated sxs/prior Treatment) HPI Comments: Patient presents to the ER for evaluation of abdominal pain, nausea, vomiting and diarrhea. Patient reports that she has been experiencing diarrhea for more than a week. She saw her primary doctor, had stool samples sent and everything was negative. Patient reports that she continues to have left-sided abdominal pain and today has developed nausea and vomiting. She feels very weak and dehydrated.  Patient is a 54 y.o. female presenting with vomiting.  Emesis Associated symptoms: abdominal pain and diarrhea     Past Medical History  Diagnosis Date  . Fuchs' corneal dystrophy   . Essential hypertension, benign   . Mixed hyperlipidemia   . Mitral valve prolapse     Mild mitral regurgitation  . GERD (gastroesophageal reflux disease)   . Arthritis   . Anxiety   . Breast cancer ([place]) 02/22/2012    Right T1c, N0  . Depression    Past Surgical History  Procedure Laterality Date  . Carpal tunnel r/l  1992  . Ray cage fusion  1997  . Cervical spine fusion x2 since last visit    . Elbow surgery   tennis elbow release 2012  . Abdominal hysterectomy  1999  . Sentinel lymph node biopsy  10/30/2002    right  . Ovarian cyst removal      prior to hysterectomy, right  . Breast lumpectomy  10/30/2002    right  . Esophagogastroduodenoscopy  10/26/2012    Procedure: ESOPHAGOGASTRODUODENOSCOPY (EGD);  Surgeon: Inda Castle, MD;  Location: [place];  Service: Endoscopy;  Laterality: N/A;  . Left heart catheterization with coronary angiogram N/A 10/23/2012    Procedure: LEFT HEART CATHETERIZATION WITH CORONARY ANGIOGRAM;  Surgeon: Peter M Martinique, MD;  Location: [facility] CATH LAB;  Service: Cardiovascular;   Laterality: N/A;   Family History  Problem Relation Age of Onset  . Diabetes Mother   . Pancreatic cancer Mother   . Lung cancer Father   . Diabetes Father   . Heart disease Father   . Esophageal cancer Father 67    Beer drinker  . Brain cancer Brother 36  . Brain cancer Maternal Grandfather   . Heart disease Paternal Grandfather   . Anesthesia problems Neg Hx   . Hypotension Neg Hx   . Malignant hyperthermia Neg Hx   . Pseudochol deficiency Neg Hx   . Cancer Maternal Uncle     unknown form of cancer; cigar smoker  . Lung cancer Maternal Grandmother   . Brain cancer Cousin     dx in his late 29s-40s  . Schizophrenia Sister    Social History  Substance Use Topics  . Smoking status: Current Every Day Smoker -- 0.25 packs/day for 35 years    Types: Cigarettes    Start date: 07/07/1974    Last Attempt to Quit: 06/30/2001  . Smokeless tobacco: Never Used  . Alcohol Use: 0.0 oz/week    0 Standard drinks or equivalent per week     Comment: Rare( stopped July 26, 2014)   OB History    No data available     Review of Systems  Constitutional: Positive for fatigue.  Gastrointestinal: Positive for nausea, vomiting, abdominal pain and diarrhea.  All other systems reviewed and are negative.  Allergies  Bee venom; Haloperidol lactate; and Meperidine hcl  Home Medications   Prior to Admission medications   Medication Sig Start Date End Date Taking? Authorizing Provider  ALPRAZolam Duanne Moron) 1 MG tablet Take 1 tablet (1 mg total) by mouth 4 (four) times daily. 07/28/15 07/27/16  Cloria Spring, MD  aspirin EC 81 MG EC tablet Take 1 tablet (81 mg total) by mouth daily. 10/26/12   Roger A Arguello, PA-C  Calcium-Magnesium-Vitamin D (CALCIUM 500 PO) Take 1 tablet by mouth 2 (two) times daily.    Historical Provider, MD  EPINEPHrine (EPI-PEN) 0.3 mg/0.3 mL DEVI Inject 0.3 mg into the muscle as needed. For bee venom    Historical Provider, MD  fish oil-omega-3 fatty acids 1000  MG capsule Take 2 g by mouth every morning.    Historical Provider, MD  hydrochlorothiazide (MICROZIDE) 12.5 MG capsule TAKE 1 CAPSULE BY MOUTH EVERY DAY 04/20/15   Satira Sark, MD  metoprolol succinate (TOPROL-XL) 50 MG 24 hr tablet May take 75 mg ( 1 1/2 tablets) daily for increased palpitations per mD 08/25/15   Satira Sark, MD  Multiple Vitamin (MULITIVITAMIN WITH MINERALS) TABS Take 1 tablet by mouth every morning.    Historical Provider, MD  naproxen sodium (ANAPROX) 220 MG tablet Take 220 mg by mouth as needed. 1-2 TABLETS DAILY    Historical Provider, MD  NIFEdipine (PROCARDIA XL/ADALAT-CC) 90 MG 24 hr tablet Take 1 tablet (90 mg total) by mouth daily. 08/23/15   Satira Sark, MD  nitroGLYCERIN (NITROSTAT) 0.4 MG SL tablet Place 1 tablet (0.4 mg total) under the tongue every 5 (five) minutes x 3 doses as needed for chest pain. 01/13/14   Satira Sark, MD  pantoprazole (PROTONIX) 40 MG tablet TAKE 1 TABLET BY MOUTH EVERY DAY 01/21/15   Satira Sark, MD  PARoxetine (PAXIL) 20 MG tablet Take 1.5 tablets (30 mg total) by mouth daily. 07/28/15   Cloria Spring, MD  simvastatin (ZOCOR) 40 MG tablet Take 40 mg by mouth every morning.     Historical Provider, MD  sodium chloride (MURO 128) 5 % ophthalmic solution 1 drop as needed.    Historical Provider, MD  traMADol (ULTRAM) 50 MG tablet Take 50 mg by mouth every 6 (six) hours as needed.    Historical Provider, MD   BP 144/84 mmHg  Pulse 117  Temp(Src) 98.1 F (36.7 C) (Oral)  Resp 22  Ht 5\' 4"  (1.626 m)  Wt 180 lb (81.647 kg)  BMI 30.88 kg/m2  SpO2 98% Physical Exam  Constitutional: She is oriented to person, place, and time. She appears well-developed and well-nourished. No distress.  HENT:  Head: Normocephalic and atraumatic.  Right Ear: Hearing normal.  Left Ear: Hearing normal.  Nose: Nose normal.  Mouth/Throat: Oropharynx is clear and moist and mucous membranes are normal.  Eyes: Conjunctivae and EOM are normal.  Pupils are equal, round, and reactive to light.  Neck: Normal range of motion. Neck supple.  Cardiovascular: Regular rhythm, S1 normal and S2 normal.  Exam reveals no gallop and no friction rub.   No murmur heard. Pulmonary/Chest: Effort normal and breath sounds normal. No respiratory distress. She exhibits no tenderness.  Abdominal: Soft. Normal appearance and bowel sounds are normal. There is no hepatosplenomegaly. There is tenderness in the epigastric area, left upper quadrant and left lower quadrant. There is no rebound, no guarding, no tenderness at McBurney's point and negative Murphy's sign. No hernia.  Musculoskeletal: Normal range  of motion.  Neurological: She is alert and oriented to person, place, and time. She has normal strength. No cranial nerve deficit or sensory deficit. Coordination normal. GCS eye subscore is 4. GCS verbal subscore is 5. GCS motor subscore is 6.  Skin: Skin is warm, dry and intact. No rash noted. No cyanosis.  Psychiatric: She has a normal mood and affect. Her speech is normal and behavior is normal. Thought content normal.  Nursing note and vitals reviewed.   ED Course  Procedures (including critical care time) Labs Review Labs Reviewed  COMPREHENSIVE METABOLIC PANEL  CBC  URINALYSIS, ROUTINE W REFLEX MICROSCOPIC (NOT AT [facility])  LIPASE, BLOOD    Imaging Review No results found. I have personally reviewed and evaluated these images and lab results as part of my medical decision-making.   EKG Interpretation None      MDM   Final diagnoses:  None   abdominal pain Vomiting Diarrhea  Presents to emergency for evaluation of nausea, vomiting, diarrhea with abdominal pain. Patient reports that symptoms have been ongoing for more than a week. She had stool studies done by her doctor, no abnormalities were noted. She was placed empirically on Flagyl without improvement. Patient now expressing nausea and vomiting which was not present to the beginning  of the illness. She is complaining of diffuse abdominal pain. She had benign, nonsurgical abdominal exam. CT scan did not show any acute abnormality. Patient was mildly tachycardic at arrival. This resolved with IV fluids and pain medication. At this point causing the patient's pain is unclear, symptoms most likely viral in nature. Continue Flagyl, symptomatic treatment.    Orpah Greek, MD 09/17/15 2106

## 2015-09-17 NOTE — ED Notes (Signed)
Pt alert & oriented x4, stable gait. Patient given discharge instructions, paperwork & prescription(s). Patient  instructed to stop at the registration desk to finish any additional paperwork. Patient verbalized understanding. Pt left department w/ no further questions. 

## 2015-09-17 NOTE — ED Notes (Signed)
Bedside commode provide for pt.

## 2015-09-17 NOTE — ED Notes (Signed)
Pt comes in for vomiting since yesterday morning. Pt was given antibiotic on Monday for diarrhea which she has had since the 21st .  Pt has had stool samples sent off and they have come back ok. Pt has had stomach pain and back pain since yesterday.

## 2015-09-20 ENCOUNTER — Other Ambulatory Visit: Payer: Self-pay

## 2015-09-20 MED ORDER — HYDROCHLOROTHIAZIDE 12.5 MG PO CAPS
ORAL_CAPSULE | ORAL | Status: DC
Start: 2015-09-20 — End: 2016-01-19

## 2015-09-20 NOTE — Telephone Encounter (Signed)
escribed to pillpack as requested

## 2015-09-22 ENCOUNTER — Other Ambulatory Visit: Payer: Self-pay

## 2015-09-22 ENCOUNTER — Ambulatory Visit (INDEPENDENT_AMBULATORY_CARE_PROVIDER_SITE_OTHER): Payer: Managed Care, Other (non HMO) | Admitting: Gastroenterology

## 2015-09-22 ENCOUNTER — Encounter: Payer: Self-pay | Admitting: Gastroenterology

## 2015-09-22 VITALS — BP 120/80 | HR 82 | Temp 98.4°F | Ht 64.0 in | Wt 180.8 lb

## 2015-09-22 DIAGNOSIS — R197 Diarrhea, unspecified: Secondary | ICD-10-CM

## 2015-09-22 DIAGNOSIS — K52831 Collagenous colitis: Secondary | ICD-10-CM | POA: Insufficient documentation

## 2015-09-22 DIAGNOSIS — R159 Full incontinence of feces: Secondary | ICD-10-CM

## 2015-09-22 HISTORY — DX: Full incontinence of feces: R15.9

## 2015-09-22 MED ORDER — NA SULFATE-K SULFATE-MG SULF 17.5-3.13-1.6 GM/177ML PO SOLN: 1.0000 | Freq: Once | ORAL | Status: DC

## 2015-09-22 MED ORDER — DICYCLOMINE HCL 10 MG PO CAPS
ORAL_CAPSULE | ORAL | Status: DC
Start: 2015-09-22 — End: 2015-10-19

## 2015-09-22 NOTE — Progress Notes (Addendum)
Subjective:    Patient ID: Rachel Sutton, female    DOB: [birth year], 54 y.o.   MRN: [id number] HAWKINS,EDWARD L, MD  HPI PRIOR TO AUG 2016: GET UP AND HAVE COFFEE AND THEN HAVE BM(#4). AUG 2016: NOTICED CHANGE IN BOWEL HABITS. Woke up with diarrhea SEP 21. Thought it was a stomach virus. Went to DR. HAWKINS FOLLOWING MON. STOOLS SAMPLES NEGATIVE. TRIED ABX FOR PARASITE OR VIRUS. WENT TO ED FOR BAD STOMACH PAIN. GIVEN IVFs AND PAIN MEDS AND SENT HOME. CT SCAN NEGATIVE. BMs: 3 TIMES IN OFC. DURING DAY 10-20(WATERY STOOLS) DURING DAY AND 2-3 TIMES AT NIGHT. DRINKING GATORADE. TEMP: NO MORE THAN 100 F. No sick contacts AT [place]. Has dogs. NO PRIOR ABX BEFORE DIARRHEA TSTARTED. NO WELL WATER OR TRAVEL. NO NEW MEDS OR OTC SUPPLEMENTS. MILK: EVERY DAY AT LEAST 1 8OZ GLASS. CHEESE: 2-3 TIMES A WEEK. ICE CREAM: RARE. NAUSEA: OFF AND ON-WORSE. DRY HEAVES: 2-3 TIMES A WEEK. HAS KNNE PAIN BUT NO WORSE. UPPER ABDOMINAL PAIN: SHARP/CRAMPING, BOTH LUQ/RUQ AND MAY RADIATE TO LEFT BACK. LASTS MINS. ABDOMINAL PAIN EVERY DAY. 30 MINS AFTER SHE EATS SHE'S BLOATED. FEELS FULL FAST. PAIN SAME SINCE SEP 2016. LAST TCS 6-7 YRS AGO: DUE TO HX BREAST CA. LAST EGD 2013: CHEST PAIN. IMODIUM DIDN'T HELP. DIDN'T TRY BENTYL. WHEN SHE HAS A BM ABDOMINAL PAIN GETS BETTER. ONE ASA DAILY SINCE 2011. NO BC/GOODY POWDERS, IBUPROFEN/MOTRIN, OR NAPROXEN/ALEVE. HASN'T TAKEN ANYTHING FOR NAUSEA. JUST DEALS WITH IT. NO THYROID TEST DONE. LOSES CONTROLS OF HER BOWEL AND HAS SOILING 2-3 TIMES A WEEK AND SOMETIMES A DAY.  PT DENIES FEVER, CHILLS, HEMATOCHEZIA, HEMATEMESIS, vomiting, melena, CHEST PAIN, SHORTNESS OF BREATH, problems swallowing, problems with sedation, OR heartburn or indigestion. NO SORES IN MOUTH, RASH ON LEGS, JOINT PAIN, OR BACK PAIN. STRESS: TREATMENT FOR ANXIETY AND DEPRESSION SINCE LAST YEAR(HUSBAND PASSED 2015-CHF/COPD).  Past Medical History  Diagnosis Date  . Fuchs' corneal dystrophy   . Essential hypertension,  benign   . Mixed hyperlipidemia   . Mitral valve prolapse     Mild mitral regurgitation  . GERD (gastroesophageal reflux disease)   . Arthritis   . Anxiety   . Breast cancer ([place]) 02/22/2012    Right T1c, N0  . Depression    Past Surgical History  Procedure Laterality Date  . Carpal tunnel r/l  1992  . Ray cage fusion  1997  . Cervical spine fusion x2 since last visit    . Elbow surgery   tennis elbow release 2012  . Abdominal hysterectomy  1999  . Sentinel lymph node biopsy  10/30/2002    right  . Ovarian cyst removal      prior to hysterectomy, right  . Breast lumpectomy  10/30/2002    right  . Esophagogastroduodenoscopy  10/26/2012    Procedure: ESOPHAGOGASTRODUODENOSCOPY (EGD);  Surgeon: Inda Castle, MD;  Location: [facility];  Service: Endoscopy;  Laterality: N/A;  . Left heart catheterization with coronary angiogram N/A 10/23/2012    Procedure: LEFT HEART CATHETERIZATION WITH CORONARY ANGIOGRAM;  Surgeon: Peter M Martinique, MD;  Location: [facility] CATH LAB;  Service: Cardiovascular;  Laterality: N/A;   Allergies  Allergen Reactions  . Bee Venom Anaphylaxis  . Haloperidol Lactate Other (See Comments)    Convulsions   . Meperidine Hcl Hives    Current Outpatient Prescriptions  Medication Sig Dispense Refill  . ALPRAZolam (XANAX) 1 MG tablet Patient taking differently: Take 1 mg by mouth 2 (two) times daily.     Marland Kitchen  aspirin EC 81 MG EC tablet Take 1 tablet (81 mg total) by mouth daily.    . Biotin 5 MG TABS Take 1 tablet by mouth daily.    . Calcium-Magnesium-Vitamin D (CALCIUM 500 PO) Take 1 tablet by mouth 2 (two) times daily.    Marland Kitchen dicyclomine (BENTYL) 20 MG tablet Take 1 tablet (20 mg total) by mouth 2 (two) times daily.    Marland Kitchen EPINEPHrine (EPI-PEN) 0.3 mg/0.3 mL DEVI Inject 0.3 mg into the muscle as needed. For bee venom    . fish oil-omega-3 fatty acids 1000 MG capsule Take 2 g by mouth every morning.    . hydrochlorothiazide (MICROZIDE) 12.5 MG capsule TAKE 1 CAPSULE BY  MOUTH EVERY DAY PRN   . metoprolol succinate (TOPROL-XL) 50 MG 24 hr tablet May take 75 mg ( 1 1/2 tablets) daily for increased palpitations per mD    . Multiple Vitamin (MULITIVITAMIN WITH MINERALS) TABS Take 1 tablet by mouth every morning.    Marland Kitchen NIFEdipine (PROCARDIA XL/ADALAT-CC) 90 MG 24 hr tablet Take 1 tablet (90 mg total) by mouth daily.    . nitroGLYCERIN (NITROSTAT) 0.4 MG SL tablet Place 1 tablet (0.4 mg total) under the tongue every 5 (five) minutes x 3 doses as needed for chest pain.    Marland Kitchen PARoxetine (PAXIL) 20 MG tablet Take 1.5 tablets (30 mg total) by mouth daily.    . simvastatin (ZOCOR) 40 MG tablet Take 40 mg by mouth every morning.     . sodium chloride (MURO 128) 5 % ophthalmic solution Place 1 drop into both eyes as needed for irritation.     . traMADol (ULTRAM) 50 MG tablet Take 50 mg by mouth every 6 (six) hours as needed for moderate pain.     .      .      . promethazine (PHENERGAN) 25 MG tablet Take 1 tablet (25 mg total) by mouth every 6 (six) hours as needed for nausea or vomiting. (Patient not taking: Reported on 09/22/2015)       Family History  Problem Relation Age of Onset  . Diabetes Mother   . Pancreatic cancer Mother   . Lung cancer Father   . Diabetes Father   . Heart disease Father   . Esophageal cancer Father 35    Beer drinker  . Brain cancer Brother 31  . Brain cancer Maternal Grandfather   . Heart disease Paternal Grandfather   . Anesthesia problems Neg Hx   . Hypotension Neg Hx   . Malignant hyperthermia Neg Hx   . Pseudochol deficiency Neg Hx   . Cancer Maternal Uncle     unknown form of cancer; cigar smoker  . Lung cancer Maternal Grandmother   . Brain cancer Cousin     dx in his late 14s-40s  . Schizophrenia Sister    Social History  Substance Use Topics  . Smoking status: Current Every Day Smoker -- 0.25 packs/day for 35 years    Types: Cigarettes    Start date: 07/07/1974    Last Attempt to Quit: 06/30/2001  . Smokeless tobacco:  Never Used  . Alcohol Use: 0.0 oz/week    0 Standard drinks or equivalent per week     Comment: Rare( stopped July 26, 2014)   Review of Systems PER HPI OTHERWISE ALL SYSTEMS ARE NEGATIVE.    Objective:   Physical Exam  Constitutional: She is oriented to person, place, and time. She appears well-developed and well-nourished. No distress.  HENT:  Head: Normocephalic and atraumatic.  Mouth/Throat: Oropharynx is clear and moist. No oropharyngeal exudate.  NO ORAL LESIONS/ULCERS  Eyes: Pupils are equal, round, and reactive to light. No scleral icterus.  Neck: Normal range of motion. Neck supple.  Cardiovascular: Normal rate, regular rhythm and normal heart sounds.   Pulmonary/Chest: Effort normal and breath sounds normal. No respiratory distress.  Abdominal: Soft. Bowel sounds are normal. She exhibits no distension. There is tenderness. There is no rebound and no guarding.  MILD RUQ/LUQ TTP   Musculoskeletal: She exhibits no edema.  Lymphadenopathy:    She has no cervical adenopathy.  Neurological: She is alert and oriented to person, place, and time.  NO FOCAL DEFICITS   Skin:  NO PRETIBIAL LESIONS   Psychiatric: She has a normal mood and affect.  Vitals reviewed.         Assessment & Plan:

## 2015-09-22 NOTE — Assessment & Plan Note (Signed)
ACUTE IN ONSET AND NOW PERSIST AND GETTING WORSE.  DIFFERENTIAL DIAGNOSIS INCLUDES: IBS-D, MICROSCOPIC OR EOSINOPHILIC GASTRITIS/DUODENITIS/COLITIS, LESS LIKELY CROHN;S DISEASE. LABS FROM OCT 2016 K 3.4 O/W NORMAL AND CT SCAN SHOWS FATTY LIVER.  SUBMIT BLOOD(TSH, TTG IGa) AND STOOL SAMPLES(GIARDIA/ STOOL OSM, Na, K). AVOID DIARY. FOLLOW A DAIRY FREE DIET. SEE INFO BELOW. USE BENTYL 10 MG 1-2 30 MINS PRIOR TO MEALS(8A, 12N, 4P) AND AT BEDTIME(8P). IT MAY CAUSE DROWSINESS, DRY EYES/MOUTH, BLURRY VISION, OR DIFFICULTY URINATING. UPPER ENDOSCOPY AND COLONOSCOPY WITHIN 2 WEEKS. FOLLOW UP IN 3 MOS.

## 2015-09-22 NOTE — Progress Notes (Signed)
CC'ED TO PCP 

## 2015-09-22 NOTE — Addendum Note (Signed)
Addended by: Danie Binder on: 09/22/2015 10:01 AM   Modules accepted: Orders

## 2015-09-22 NOTE — Patient Instructions (Addendum)
SUBMIT BLOOD AND STOOL SAMPLES.  AVOID DIARY. FOLLOW A DAIRY FREE DIET. SEE INFO BELOW.  USE BENTYL 10 MG 1-2 30 MINS PRIOR TO MEALS(8A, 12N, 4P) AND AT BEDTIME(8P). IT MAY CAUSE DROWSINESS, DRY EYES/MOUTH, BLURRY VISION, OR DIFFICULTY URINATING.  UPPER ENDOSCOPY AND COLONOSCOPY WITHIN 2 WEEKS.  FOLLOW UP IN 3 MOS.    Lactose Free Diet Lactose is a carbohydrate that is found mainly in milk and milk products, as well as in foods with added milk or whey. Lactose must be digested by the enzyme in order to be used by the body. Lactose intolerance occurs when there is a shortage of lactase. When your body is not able to digest lactose, you may feel sick to your stomach (nausea), bloating, cramping, gas and diarrhea.  There are many dairy products that may be tolerated better than milk by some people:  The use of cultured dairy products such as yogurt, buttermilk, cottage cheese, and sweet acidophilus milk (Kefir) for lactase-deficient individuals is usually well tolerated. This is because the healthy bacteria help digest lactose.   Lactose-hydrolyzed milk (Lactaid) contains 40-90% less lactose than milk and may also be well tolerated.    SPECIAL NOTES  Lactose is a carbohydrates. The major food source is dairy products. Reading food labels is important. Many products contain lactose even when they are not made from milk. Look for the following words: whey, milk solids, dry milk solids, nonfat dry milk powder. Typical sources of lactose other than dairy products include breads, candies, cold cuts, prepared and processed foods, and commercial sauces and gravies.   All foods must be prepared without milk, cream, or other dairy foods.   Soy milk and lactose-free supplements (LACTASE) may be used as an alternative to milk.   FOOD GROUP ALLOWED/RECOMMENDED AVOID/USE SPARINGLY  BREADS / STARCHES 4 servings or more* Breads and rolls made without milk. [place], [place], or [place] bread. Breads and  rolls that contain milk. Prepared mixes such as muffins, biscuits, waffles, pancakes. Sweet rolls, donuts, [place] toast (if made with milk or lactose).  Crackers: Soda crackers, graham crackers. Any crackers prepared without lactose. Zwieback crackers, corn curls, or any that contain lactose.  Cereals: Cooked or dry cereals prepared without lactose (read labels). Cooked or dry cereals prepared with lactose (read labels). Total, Cocoa Krispies. Special K.  Potatoes / Pasta / Rice: Any prepared without milk or lactose. Popcorn. Instant potatoes, frozen [place] fries, scalloped or au gratin potatoes.  VEGETABLES 2 servings or more Fresh, frozen, and canned vegetables. Creamed or breaded vegetables. Vegetables in a cheese sauce or with lactose-containing margarines.  FRUIT 2 servings or more All fresh, canned, or frozen fruits that are not processed with lactose. Any canned or frozen fruits processed with lactose.  MEAT & SUBSTITUTES 2 servings or more (4 to 6 oz. total per day) Plain beef, chicken, fish, [place], lamb, veal, pork, or ham. Kosher prepared meat products. Strained or junior meats that do not contain milk. Eggs, soy meat substitutes, nuts. Scrambled eggs, omelets, and souffles that contain milk. Creamed or breaded meat, fish, or fowl. Sausage products such as wieners, liver sausage, or cold cuts that contain milk solids. Cheese, cottage cheese, or cheese spreads.  MILK None. (See "BEVERAGES" for milk substitutes. See "DESSERTS" for ice cream and frozen desserts.) Milk (whole, 2%, skim, or chocolate). Evaporated, powdered, or condensed milk; malted milk.  SOUPS & COMBINATION FOODS Bouillon, broth, vegetable soups, clear soups, consomms. Homemade soups made with allowed ingredients. Combination or prepared foods  that do not contain milk or milk products (read labels). Cream soups, chowders, commercially prepared soups containing lactose. Macaroni and cheese, pizza. Combination or prepared foods  that contain milk or milk products.  DESSERTS & SWEETS In moderation Water and fruit ices; gelatin; angel food cake. Homemade cookies, pies, or cakes made from allowed ingredients. Pudding (if made with water or a milk substitute). Lactose-free tofu desserts. Sugar, honey, corn syrup, jam, jelly; marmalade; molasses (beet sugar); Pure sugar candy; marshmallows. Ice cream, ice milk, sherbet, custard, pudding, frozen yogurt. Commercial cake and cookie mixes. Desserts that contain chocolate. Pie crust made with milk-containing margarine; reduced-calorie desserts made with a sugar substitute that contains lactose. Toffee, peppermint, butterscotch, chocolate, caramels.  FATS & OILS In moderation Butter (as tolerated; contains very small amounts of lactose). Margarines and dressings that do not contain milk, Vegetable oils, shortening, Miracle Whip, mayonnaise, nondairy cream & whipped toppings without lactose or milk solids added (examples: Coffee Rich, Carnation Coffeemate, Rich's Whipped Topping, PolyRich). Berniece Salines. Margarines and salad dressings containing milk; cream, cream cheese; peanut butter with added milk solids, sour cream, chip dips, made with sour cream.  BEVERAGES Carbonated drinks; tea; coffee and freeze-dried coffee; some instant coffees (check labels). Fruit drinks; fruit and vegetable juice; Rice or Soy milk. Ovaltine, hot chocolate. Some cocoas; some instant coffees; instant iced teas; powdered fruit drinks (read labels).   CONDIMENTS / MISCELLANEOUS Soy sauce, carob powder, olives, gravy made with water, baker's cocoa, pickles, pure seasonings and spices, wine, pure monosodium glutamate, catsup, mustard. Some chewing gums, chocolate, some cocoas. Certain antibiotics and vitamin / mineral preparations. Spice blends if they contain milk products. MSG extender. Artificial sweeteners that contain lactose such as Equal (Nutra-Sweet) and Sweet 'n Low. Some nondairy creamers (read labels).    SAMPLE MENU*  Breakfast   Orange Juice.  Banana.   Bran flakes.   Nondairy Creamer.  [place] Bread (toasted).   Butter or milk-free margarine.   Coffee or tea.    Noon Meal   Chicken Breast.  Rice.   Green beans.   Butter or milk-free margarine.  Fresh melon.   Coffee or tea.    Evening Meal   Roast Beef.  Baked potato.   Butter or milk-free margarine.   Broccoli.   Lettuce salad with vinegar and oil dressing.  [employer]   Coffee or tea.

## 2015-09-23 ENCOUNTER — Encounter: Payer: Self-pay | Admitting: Gastroenterology

## 2015-09-23 NOTE — Patient Instructions (Signed)
Rachel Sutton  09/23/2015     @PREFPERIOPPHARMACY @   Your procedure is scheduled on 09/28/2015.  Report to [facility] at 9:20 A.M.  Call this number if you have problems the morning of surgery:  [phone]   Remember:  Do not eat food or drink liquids after midnight.  Take these medicines the morning of surgery with A SIP OF WATER Xanax, Metoprolol, Bentyl, Procardia, Paxil, Tramadol   Do not wear jewelry, make-up or nail polish.  Do not wear lotions, powders, or perfumes.  You may wear deodorant.  Do not shave 48 hours prior to surgery.  Men may shave face and neck.  Do not bring valuables to the hospital.  [facility] is not responsible for any belongings or valuables.  Contacts, dentures or bridgework may not be worn into surgery.  Leave your suitcase in the car.  After surgery it may be brought to your room.  For patients admitted to the hospital, discharge time will be determined by your treatment team.  Patients discharged the day of surgery will not be allowed to drive home.    Please read over the following fact sheets that you were given. Anesthesia Post-op Instructions     PATIENT INSTRUCTIONS POST-ANESTHESIA  IMMEDIATELY FOLLOWING SURGERY:  Do not drive or operate machinery for the first twenty four hours after surgery.  Do not make any important decisions for twenty four hours after surgery or while taking narcotic pain medications or sedatives.  If you develop intractable nausea and vomiting or a severe headache please notify your doctor immediately.  FOLLOW-UP:  Please make an appointment with your surgeon as instructed. You do not need to follow up with anesthesia unless specifically instructed to do so.  WOUND CARE INSTRUCTIONS (if applicable):  Keep a dry clean dressing on the anesthesia/puncture wound site if there is drainage.  Once the wound has quit draining you may leave it open to air.  Generally you should leave the bandage intact for twenty four  hours unless there is drainage.  If the epidural site drains for more than 36-48 hours please call the anesthesia department.  QUESTIONS?:  Please feel free to call your physician or the hospital operator if you have any questions, and they will be happy to assist you.      Esophagogastroduodenoscopy Esophagogastroduodenoscopy (EGD) is a procedure that is used to examine the lining of the esophagus, stomach, and first part of the small intestine (duodenum). A long, flexible, lighted tube with a camera attached (endoscope) is inserted down the throat to view these organs. This procedure is done to detect problems or abnormalities, such as inflammation, bleeding, ulcers, or growths, in order to treat them. The procedure lasts 5-20 minutes. It is usually an outpatient procedure, but it may need to be performed in a hospital in emergency cases. LET [facility] CARE PROVIDER KNOW ABOUT:  Any allergies you have.  All medicines you are taking, including vitamins, herbs, eye drops, creams, and over-the-counter medicines.  Previous problems you or members of your family have had with the use of anesthetics.  Any blood disorders you have.  Previous surgeries you have had.  Medical conditions you have. RISKS AND COMPLICATIONS Generally, this is a safe procedure. However, problems can occur and include:  Infection.  Bleeding.  Tearing (perforation) of the esophagus, stomach, or duodenum.  Difficulty breathing or not being able to breathe.  Excessive sweating.  Spasms of the larynx.  Slowed heartbeat.  Low blood pressure. BEFORE  THE PROCEDURE  Do not eat or drink anything after midnight on the night before the procedure or as directed by your health care provider.  Do not take your regular medicines before the procedure if your health care provider asks you not to. Ask your health care provider about changing or stopping those medicines.  If you wear dentures, be prepared to remove  them before the procedure.  Arrange for someone to drive you home after the procedure. PROCEDURE  A numbing medicine (local anesthetic) may be sprayed in your throat for comfort and to stop you from gagging or coughing.  You will have an IV tube inserted in a vein in your hand or arm. You will receive medicines and fluids through this tube.  You will be given a medicine to relax you (sedative).  A pain reliever will be given through the IV tube.  A mouth guard may be placed in your mouth to protect your teeth and to keep you from biting on the endoscope.  You will be asked to lie on your left side.  The endoscope will be inserted down your throat and into your esophagus, stomach, and duodenum.  Air will be put through the endoscope to allow your health care provider to clearly view the lining of your esophagus.  The lining of your esophagus, stomach, and duodenum will be examined. During the exam, your health care provider may:  Remove tissue to be examined under a microscope (biopsy) for inflammation, infection, or other medical problems.  Remove growths.  Remove objects (foreign bodies) that are stuck.  Treat any bleeding with medicines or other devices that stop tissues from bleeding (hot cautery, clipping devices).  Widen (dilate) or stretch narrowed areas of your esophagus and stomach.  The endoscope will be withdrawn. AFTER THE PROCEDURE  You will be taken to a recovery area for observation. Your blood pressure, heart rate, breathing rate, and blood oxygen level will be monitored often until the medicines you were given have worn off.  Do not eat or drink anything until the numbing medicine has worn off and your gag reflex has returned. You may choke.  Your health care provider should be able to discuss his or her findings with you. It will take longer to discuss the test results if any biopsies were taken.   This information is not intended to replace advice given to  you by your health care provider. Make sure you discuss any questions you have with your health care provider.   Document Released: 04/05/2005 Document Revised: 12/24/2014 Document Reviewed: 11/05/2012 Elsevier Interactive Patient Education 2016 [employer]. Colonoscopy A colonoscopy is an exam to look at the entire large intestine (colon). This exam can help find problems such as tumors, polyps, inflammation, and areas of bleeding. The exam takes about 1 hour.  LET [facility] CARE PROVIDER KNOW ABOUT:   Any allergies you have.  All medicines you are taking, including vitamins, herbs, eye drops, creams, and over-the-counter medicines.  Previous problems you or members of your family have had with the use of anesthetics.  Any blood disorders you have.  Previous surgeries you have had.  Medical conditions you have. RISKS AND COMPLICATIONS  Generally, this is a safe procedure. However, as with any procedure, complications can occur. Possible complications include:  Bleeding.  Tearing or rupture of the colon wall.  Reaction to medicines given during the exam.  Infection (rare). BEFORE THE PROCEDURE   Ask your health care provider about changing or stopping  your regular medicines.  You may be prescribed an oral bowel prep. This involves drinking a large amount of medicated liquid, starting the day before your procedure. The liquid will cause you to have multiple loose stools until your stool is almost clear or light green. This cleans out your colon in preparation for the procedure.  Do not eat or drink anything else once you have started the bowel prep, unless your health care provider tells you it is safe to do so.  Arrange for someone to drive you home after the procedure. PROCEDURE   You will be given medicine to help you relax (sedative).  You will lie on your side with your knees bent.  A long, flexible tube with a light and camera on the end (colonoscope) will be  inserted through the rectum and into the colon. The camera sends video back to a computer screen as it moves through the colon. The colonoscope also releases carbon dioxide gas to inflate the colon. This helps your health care provider see the area better.  During the exam, your health care provider may take a small tissue sample (biopsy) to be examined under a microscope if any abnormalities are found.  The exam is finished when the entire colon has been viewed. AFTER THE PROCEDURE   Do not drive for 24 hours after the exam.  You may have a small amount of blood in your stool.  You may pass moderate amounts of gas and have mild abdominal cramping or bloating. This is caused by the gas used to inflate your colon during the exam.  Ask when your test results will be ready and how you will get your results. Make sure you get your test results.   This information is not intended to replace advice given to you by your health care provider. Make sure you discuss any questions you have with your health care provider.   Document Released: 11/30/2000 Document Revised: 09/23/2013 Document Reviewed: 08/10/2013 Elsevier Interactive Patient Education [employer].

## 2015-09-24 LAB — TSH: TSH: 0.706 u[IU]/mL (ref 0.350–4.500)

## 2015-09-26 ENCOUNTER — Other Ambulatory Visit: Payer: Self-pay

## 2015-09-26 ENCOUNTER — Encounter (HOSPITAL_COMMUNITY)
Admission: RE | Admit: 2015-09-26 | Discharge: 2015-09-26 | Disposition: A | Discharge status: Home / Self Care | Payer: Managed Care, Other (non HMO) | Source: Ambulatory Visit | Attending: Gastroenterology | Admitting: Gastroenterology

## 2015-09-26 ENCOUNTER — Encounter (HOSPITAL_COMMUNITY): Payer: Self-pay

## 2015-09-26 DIAGNOSIS — Z7982 Long term (current) use of aspirin: Secondary | ICD-10-CM | POA: Diagnosis not present

## 2015-09-26 DIAGNOSIS — F418 Other specified anxiety disorders: Secondary | ICD-10-CM | POA: Diagnosis not present

## 2015-09-26 DIAGNOSIS — E782 Mixed hyperlipidemia: Secondary | ICD-10-CM | POA: Diagnosis not present

## 2015-09-26 DIAGNOSIS — K5289 Other specified noninfective gastroenteritis and colitis: Secondary | ICD-10-CM | POA: Diagnosis not present

## 2015-09-26 DIAGNOSIS — Z79899 Other long term (current) drug therapy: Secondary | ICD-10-CM | POA: Diagnosis not present

## 2015-09-26 DIAGNOSIS — Z01810 Encounter for preprocedural cardiovascular examination: Secondary | ICD-10-CM | POA: Diagnosis not present

## 2015-09-26 DIAGNOSIS — Z853 Personal history of malignant neoplasm of breast: Secondary | ICD-10-CM | POA: Diagnosis not present

## 2015-09-26 DIAGNOSIS — M1990 Unspecified osteoarthritis, unspecified site: Secondary | ICD-10-CM | POA: Diagnosis not present

## 2015-09-26 DIAGNOSIS — K319 Disease of stomach and duodenum, unspecified: Secondary | ICD-10-CM | POA: Diagnosis not present

## 2015-09-26 DIAGNOSIS — K2980 Duodenitis without bleeding: Secondary | ICD-10-CM | POA: Diagnosis not present

## 2015-09-26 DIAGNOSIS — K529 Noninfective gastroenteritis and colitis, unspecified: Secondary | ICD-10-CM | POA: Diagnosis not present

## 2015-09-26 DIAGNOSIS — K5730 Diverticulosis of large intestine without perforation or abscess without bleeding: Secondary | ICD-10-CM | POA: Diagnosis not present

## 2015-09-26 DIAGNOSIS — K219 Gastro-esophageal reflux disease without esophagitis: Secondary | ICD-10-CM | POA: Diagnosis not present

## 2015-09-26 DIAGNOSIS — K648 Other hemorrhoids: Secondary | ICD-10-CM | POA: Diagnosis not present

## 2015-09-26 DIAGNOSIS — R197 Diarrhea, unspecified: Secondary | ICD-10-CM | POA: Diagnosis present

## 2015-09-26 DIAGNOSIS — Z01812 Encounter for preprocedural laboratory examination: Secondary | ICD-10-CM | POA: Diagnosis not present

## 2015-09-26 DIAGNOSIS — I10 Essential (primary) hypertension: Secondary | ICD-10-CM | POA: Diagnosis not present

## 2015-09-26 DIAGNOSIS — R1013 Epigastric pain: Secondary | ICD-10-CM | POA: Diagnosis not present

## 2015-09-26 HISTORY — DX: Unspecified convulsions: R56.9

## 2015-09-26 LAB — CBC
HCT: 39.4 % (ref 36.0–46.0)
Hemoglobin: 13.9 g/dL (ref 12.0–15.0)
MCH: 30.9 pg (ref 26.0–34.0)
MCHC: 35.3 g/dL (ref 30.0–36.0)
MCV: 87.6 fL (ref 78.0–100.0)
Platelets: 353 10*3/uL (ref 150–400)
RBC: 4.5 MIL/uL (ref 3.87–5.11)
RDW: 12.8 % (ref 11.5–15.5)
WBC: 8.2 10*3/uL (ref 4.0–10.5)

## 2015-09-26 LAB — BASIC METABOLIC PANEL
Anion gap: 8 (ref 5–15)
BUN: 18 mg/dL (ref 6–20)
CO2: 24 mmol/L (ref 22–32)
Calcium: 8.5 mg/dL — ABNORMAL LOW (ref 8.9–10.3)
Chloride: 105 mmol/L (ref 101–111)
Creatinine, Ser: 0.79 mg/dL (ref 0.44–1.00)
GFR calc Af Amer: >60 mL/min (ref >60)
GFR calc non Af Amer: >60 mL/min (ref >60)
Glucose, Bld: 108 mg/dL — ABNORMAL HIGH (ref 65–99)
Potassium: 3.7 mmol/L (ref 3.5–5.1)
Sodium: 137 mmol/L (ref 135–145)

## 2015-09-26 LAB — TISSUE TRANSGLUTAMINASE, IGA: Tissue Transglutaminase Ab, IgA: 1 U/mL (ref <4)

## 2015-09-26 LAB — GIARDIA ANTIGEN: Giardia Screen (EIA): NEGATIVE

## 2015-09-28 ENCOUNTER — Ambulatory Visit (HOSPITAL_COMMUNITY): Payer: Managed Care, Other (non HMO) | Admitting: Anesthesiology

## 2015-09-28 ENCOUNTER — Encounter (HOSPITAL_COMMUNITY): Payer: Self-pay | Admitting: *Deleted

## 2015-09-28 ENCOUNTER — Ambulatory Visit (HOSPITAL_COMMUNITY)
Admission: RE | Admit: 2015-09-28 | Discharge: 2015-09-28 | Disposition: A | Discharge status: Home Health | Payer: Managed Care, Other (non HMO) | Source: Ambulatory Visit | Attending: Gastroenterology | Admitting: Gastroenterology

## 2015-09-28 ENCOUNTER — Encounter (HOSPITAL_COMMUNITY)
Admission: RE | Disposition: A | Discharge status: Home Health | Payer: Self-pay | Source: Ambulatory Visit | Attending: Gastroenterology

## 2015-09-28 DIAGNOSIS — Z01810 Encounter for preprocedural cardiovascular examination: Secondary | ICD-10-CM | POA: Insufficient documentation

## 2015-09-28 DIAGNOSIS — K648 Other hemorrhoids: Secondary | ICD-10-CM | POA: Insufficient documentation

## 2015-09-28 DIAGNOSIS — Z7982 Long term (current) use of aspirin: Secondary | ICD-10-CM | POA: Insufficient documentation

## 2015-09-28 DIAGNOSIS — K2980 Duodenitis without bleeding: Secondary | ICD-10-CM | POA: Insufficient documentation

## 2015-09-28 DIAGNOSIS — Z01812 Encounter for preprocedural laboratory examination: Secondary | ICD-10-CM | POA: Insufficient documentation

## 2015-09-28 DIAGNOSIS — K319 Disease of stomach and duodenum, unspecified: Secondary | ICD-10-CM | POA: Insufficient documentation

## 2015-09-28 DIAGNOSIS — K5289 Other specified noninfective gastroenteritis and colitis: Secondary | ICD-10-CM | POA: Insufficient documentation

## 2015-09-28 DIAGNOSIS — K219 Gastro-esophageal reflux disease without esophagitis: Secondary | ICD-10-CM | POA: Insufficient documentation

## 2015-09-28 DIAGNOSIS — K529 Noninfective gastroenteritis and colitis, unspecified: Secondary | ICD-10-CM | POA: Insufficient documentation

## 2015-09-28 DIAGNOSIS — R197 Diarrhea, unspecified: Secondary | ICD-10-CM | POA: Diagnosis not present

## 2015-09-28 DIAGNOSIS — Z853 Personal history of malignant neoplasm of breast: Secondary | ICD-10-CM | POA: Insufficient documentation

## 2015-09-28 DIAGNOSIS — F418 Other specified anxiety disorders: Secondary | ICD-10-CM | POA: Insufficient documentation

## 2015-09-28 DIAGNOSIS — R1013 Epigastric pain: Secondary | ICD-10-CM | POA: Insufficient documentation

## 2015-09-28 DIAGNOSIS — K5730 Diverticulosis of large intestine without perforation or abscess without bleeding: Secondary | ICD-10-CM | POA: Insufficient documentation

## 2015-09-28 DIAGNOSIS — M1990 Unspecified osteoarthritis, unspecified site: Secondary | ICD-10-CM | POA: Insufficient documentation

## 2015-09-28 DIAGNOSIS — Z79899 Other long term (current) drug therapy: Secondary | ICD-10-CM | POA: Insufficient documentation

## 2015-09-28 DIAGNOSIS — I10 Essential (primary) hypertension: Secondary | ICD-10-CM | POA: Insufficient documentation

## 2015-09-28 DIAGNOSIS — E782 Mixed hyperlipidemia: Secondary | ICD-10-CM | POA: Insufficient documentation

## 2015-09-28 HISTORY — PX: BIOPSY: SHX5522

## 2015-09-28 HISTORY — PX: ESOPHAGOGASTRODUODENOSCOPY (EGD) WITH PROPOFOL: SHX5813

## 2015-09-28 HISTORY — PX: COLONOSCOPY WITH PROPOFOL: SHX5780

## 2015-09-28 LAB — SODIUM, STOOL: Sodium, Feces: 74.6 mEq/L

## 2015-09-28 LAB — POTASSIUM, STOOL: Potassium, Feces: 30 mEq/L

## 2015-09-28 SURGERY — COLONOSCOPY WITH PROPOFOL
Anesthesia: Monitor Anesthesia Care

## 2015-09-28 MED ORDER — LIDOCAINE VISCOUS 2 % MT SOLN
OROMUCOSAL | Status: AC
  Filled 2015-09-28: qty 15

## 2015-09-28 MED ORDER — PROPOFOL 10 MG/ML IV BOLUS
INTRAVENOUS | Status: AC
  Filled 2015-09-28: qty 20

## 2015-09-28 MED ORDER — SIMETHICONE 40 MG/0.6ML PO SUSP
ORAL | Status: DC | PRN
  Administered 2015-09-28: 1000 mL

## 2015-09-28 MED ORDER — FENTANYL CITRATE (PF) 100 MCG/2ML IJ SOLN
25.0000 ug | INTRAMUSCULAR | Status: AC
  Administered 2015-09-28 (×2): 25 ug via INTRAVENOUS

## 2015-09-28 MED ORDER — ONDANSETRON HCL 4 MG/2ML IJ SOLN
INTRAMUSCULAR | Status: AC
  Filled 2015-09-28: qty 2

## 2015-09-28 MED ORDER — ONDANSETRON HCL 4 MG/2ML IJ SOLN: 4.0000 mg | Freq: Once | INTRAMUSCULAR | Status: DC | PRN

## 2015-09-28 MED ORDER — MIDAZOLAM HCL 5 MG/5ML IJ SOLN
INTRAMUSCULAR | Status: DC | PRN
  Administered 2015-09-28: 2 mg via INTRAVENOUS

## 2015-09-28 MED ORDER — PROPOFOL 500 MG/50ML IV EMUL
INTRAVENOUS | Status: DC | PRN
  Administered 2015-09-28: 125 ug/kg/min via INTRAVENOUS
  Administered 2015-09-28: 13:00:00 via INTRAVENOUS

## 2015-09-28 MED ORDER — ONDANSETRON HCL 4 MG/2ML IJ SOLN
4.0000 mg | Freq: Once | INTRAMUSCULAR | Status: AC
  Administered 2015-09-28: 4 mg via INTRAVENOUS

## 2015-09-28 MED ORDER — FENTANYL CITRATE (PF) 100 MCG/2ML IJ SOLN
INTRAMUSCULAR | Status: AC
  Filled 2015-09-28: qty 2

## 2015-09-28 MED ORDER — PROPOFOL 500 MG/50ML IV EMUL: INTRAVENOUS | Status: DC | PRN

## 2015-09-28 MED ORDER — LACTATED RINGERS IV SOLN
INTRAVENOUS | Status: DC
  Administered 2015-09-28: 10:00:00 via INTRAVENOUS

## 2015-09-28 MED ORDER — MIDAZOLAM HCL 2 MG/2ML IJ SOLN
INTRAMUSCULAR | Status: AC
  Filled 2015-09-28: qty 4

## 2015-09-28 MED ORDER — FENTANYL CITRATE (PF) 100 MCG/2ML IJ SOLN: 25.0000 ug | INTRAMUSCULAR | Status: DC | PRN

## 2015-09-28 MED ORDER — MIDAZOLAM HCL 2 MG/2ML IJ SOLN
INTRAMUSCULAR | Status: AC
  Filled 2015-09-28: qty 2

## 2015-09-28 MED ORDER — MIDAZOLAM HCL 2 MG/2ML IJ SOLN
1.0000 mg | INTRAMUSCULAR | Status: DC | PRN
Start: 2015-09-28 — End: 2015-09-28
  Administered 2015-09-28 (×2): 2 mg via INTRAVENOUS
  Filled 2015-09-28: qty 2

## 2015-09-28 MED ORDER — MIDAZOLAM HCL 5 MG/5ML IJ SOLN: INTRAMUSCULAR | Status: DC | PRN

## 2015-09-28 MED ORDER — LIDOCAINE VISCOUS 2 % MT SOLN
6.0000 mL | Freq: Once | OROMUCOSAL | Status: AC
  Administered 2015-09-28: 6 mL via OROMUCOSAL

## 2015-09-28 SURGICAL SUPPLY — 25 items
AMPLIFEYE LARGE BLUE (MISCELLANEOUS) ×2 IMPLANT
BLOCK BITE 60FR ADLT L/F BLUE (MISCELLANEOUS) ×1 IMPLANT
ELECT REM PT RETURN 9FT ADLT (ELECTROSURGICAL)
ELECTRODE REM PT RTRN 9FT ADLT (ELECTROSURGICAL) IMPLANT
FCP BXJMBJMB 240X2.8X (CUTTING FORCEPS)
FLOOR PAD 36X40 (MISCELLANEOUS) ×2
FORCEPS BIOP RAD 4 LRG CAP 4 (CUTTING FORCEPS) ×2 IMPLANT
FORCEPS BIOP RJ4 240 W/NDL (CUTTING FORCEPS)
FORCEPS BXJMBJMB 240X2.8X (CUTTING FORCEPS) IMPLANT
FORMALIN 10 PREFIL 20ML (MISCELLANEOUS) ×5 IMPLANT
INJECTOR/SNARE I SNARE (MISCELLANEOUS) IMPLANT
KIT ENDO PROCEDURE PEN (KITS) ×2 IMPLANT
MANIFOLD NEPTUNE II (INSTRUMENTS) ×2 IMPLANT
NDL SCLEROTHERAPY 25GX240 (NEEDLE) IMPLANT
NEEDLE SCLEROTHERAPY 25GX240 (NEEDLE) IMPLANT
PAD FLOOR 36X40 (MISCELLANEOUS) ×1 IMPLANT
PROBE APC STR FIRE (PROBE) IMPLANT
PROBE INJECTION GOLD (MISCELLANEOUS)
PROBE INJECTION GOLD 7FR (MISCELLANEOUS) IMPLANT
SNARE SHORT THROW 13M SML OVAL (MISCELLANEOUS) ×1 IMPLANT
SYR 50ML LL SCALE MARK (SYRINGE) ×1 IMPLANT
SYR INFLATION 60ML (SYRINGE) IMPLANT
TRAP SPECIMEN MUCOUS 40CC (MISCELLANEOUS) IMPLANT
TUBING IRRIGATION ENDOGATOR (MISCELLANEOUS) ×1 IMPLANT
WATER STERILE IRR 1000ML POUR (IV SOLUTION) ×1 IMPLANT

## 2015-09-28 NOTE — Transfer of Care (Signed)
Immediate Anesthesia Transfer of Care Note  Patient: Rachel Sutton  Procedure(s) Performed: Procedure(s) with comments: COLONOSCOPY WITH PROPOFOL (N/A) - procedure 1 cecum time in 1232  time out 1246   total time 14 mintes ESOPHAGOGASTRODUODENOSCOPY (EGD) WITH PROPOFOL (N/A) BIOPSY (N/A)  Patient Location: PACU  Anesthesia Type:MAC  Level of Consciousness: sedated  Airway & Oxygen Therapy: Patient Spontanous Breathing and Patient connected to face mask oxygen  Post-op Assessment: Report given to RN, Post -op Vital signs reviewed and stable and Patient moving all extremities  Post vital signs: Reviewed and stable  Last Vitals:  Filed Vitals:   09/28/15 1200  BP: 123/68  Temp:   Resp: 25    Complications: No apparent anesthesia complications

## 2015-09-28 NOTE — Anesthesia Preprocedure Evaluation (Addendum)
Anesthesia Evaluation  Patient identified by MRN, date of birth, ID band Patient awake    Reviewed: Allergy & Precautions, H&P , NPO status , Patient's Chart, lab work & pertinent test results, reviewed documented beta blocker date and time   Airway Mallampati: II  TM Distance: >3 FB Neck ROM: Limited    Dental no notable dental hx.    Pulmonary Current Smoker,    Pulmonary exam normal        Cardiovascular hypertension, Pt. on medications and Pt. on home beta blockers + Valvular Problems/Murmurs (hx tachycardia) MVP  Rhythm:Regular Rate:Normal     Neuro/Psych Anxiety negative neurological ROS     GI/Hepatic Neg liver ROS, GERD  ,  Endo/Other  negative endocrine ROS  Renal/GU negative Renal ROS     Musculoskeletal  (+) Arthritis ,   Abdominal   Peds  Hematology negative hematology ROS (+)   Anesthesia Other Findings   Reproductive/Obstetrics                            Anesthesia Physical Anesthesia Plan  ASA: II  Anesthesia Plan: MAC   Post-op Pain Management:    Induction: Intravenous  Airway Management Planned: Simple Face Mask  Additional Equipment:   Intra-op Plan:   Post-operative Plan:   Informed Consent: I have reviewed the patients History and Physical, chart, labs and discussed the procedure including the risks, benefits and alternatives for the proposed anesthesia with the patient or authorized representative who has indicated his/her understanding and acceptance.   Dental advisory given  Plan Discussed with: CRNA  Anesthesia Plan Comments:         Anesthesia Quick Evaluation

## 2015-09-28 NOTE — Interval H&P Note (Signed)
History and Physical Interval Note:  09/28/2015 11:01 AM  Rachel Sutton  has presented today for surgery, with the diagnosis of diarrhea, abd pain, weight loss  The various methods of treatment have been discussed with the patient and family. After consideration of risks, benefits and other options for treatment, the patient has consented to  Procedure(s) with comments: COLONOSCOPY WITH PROPOFOL (N/A) - 1050 ESOPHAGOGASTRODUODENOSCOPY (EGD) WITH PROPOFOL (N/A) as a surgical intervention .  The patient's history has been reviewed, patient examined, no change in status, stable for surgery.  I have reviewed the patient's chart and labs.  Questions were answered to the patient's satisfaction.     [employer]

## 2015-09-28 NOTE — Anesthesia Postprocedure Evaluation (Signed)
  Anesthesia Post-op Note  Patient: Rachel Sutton  Procedure(s) Performed: Procedure(s) with comments: COLONOSCOPY WITH PROPOFOL (N/A) - procedure 1 cecum time in 1232  time out 1246   total time 14 mintes ESOPHAGOGASTRODUODENOSCOPY (EGD) WITH PROPOFOL (N/A) BIOPSY (N/A)  Patient Location: PACU  Anesthesia Type:MAC  Level of Consciousness: awake, alert , oriented and patient cooperative  Airway and Oxygen Therapy: Patient Spontanous Breathing  Post-op Pain: none  Post-op Assessment: Post-op Vital signs reviewed, Patient's Cardiovascular Status Stable, Respiratory Function Stable and Patent Airway              Post-op Vital Signs: Reviewed and stable  Last Vitals:  Filed Vitals:   09/28/15 1200  BP: 123/68  Temp:   Resp: 25    Complications: No apparent anesthesia complications

## 2015-09-28 NOTE — Op Note (Signed)
[facility] [address] [place], [address]   COLONOSCOPY PROCEDURE REPORT  PATIENT: Special, Ranes  MR#: [id number] BIRTHDATE: 09-24-1961 , 20  yrs. old GENDER: female ENDOSCOPIST: Danie Binder, MD REFERRED LY:YTKPTW Hawkins, M.D. PROCEDURE DATE:  09-29-15 PROCEDURE:   Colonoscopy with biopsy INDICATIONS:unexplained diarrhea. MEDICATIONS: Monitored anesthesia care  DESCRIPTION OF PROCEDURE:    Physical exam was performed.  Informed consent was obtained from the patient after explaining the benefits, risks, and alternatives to procedure.  The patient was connected to monitor and placed in left lateral position. Continuous oxygen was provided by nasal cannula and IV medicine administered through an indwelling cannula.  After administration of sedation and rectal exam, the patients rectum was intubated and the     colonoscope was advanced under direct visualization to the ileum.  The scope was removed slowly by carefully examining the color, texture, anatomy, and integrity mucosa on the way out.  The patient was recovered in endoscopy and discharged home in satisfactory condition. Estimated blood loss is zero unless otherwise noted in this procedure report.   COLON FINDINGS: The examined terminal ileum appeared to be normal. , There was mild diverticulosis noted in the sigmoid colon and ascending colon with associated tortuosity.  , The colonic mucosa appeared normal.  Multiple biopsies were performed using cold forceps.  , and Small internal hemorrhoids were found.  PREP QUALITY: excellent.CECAL W/D TIME: 14       minutes COMPLICATIONS: None  ENDOSCOPIC IMPRESSION: 1.   NO SOURCE FOR DIARRHEA IDENTIFIED 2.   Mild diverticulosis in the sigmoid colon and ascending colon 3.   Small internal hemorrhoids  RECOMMENDATIONS: AVOID ITEMS THAT TRIGGER GASTRITIS. FOLLOW A DAIRY FREE/HIGH FIBER DIET. USE BENTYL 10 MG 1-2 30 MINS PRIOR TO MEALS(8A, 12N, 4P) AND  AT BEDTIME(8P). AWAIT BIOPSY RESULTS. FOLLOW UP IN 3 MOS. eSigned:  Danie Binder, MD Sep 29, 2015 11:00 PM CPT CODES: ICD CODES:  The ICD and CPT codes recommended by this software are interpretations from the data that the clinical staff has captured with the software.  The verification of the translation of this report to the ICD and CPT codes and modifiers is the sole responsibility of the health care institution and practicing physician where this report was generated.  [employer] will not be held responsible for the validity of the ICD and CPT codes included on this report.  AMA assumes no liability for data contained or not contained herein. CPT is a Designer, television/film set of the [employer].

## 2015-09-28 NOTE — Discharge Instructions (Signed)
NO OBVIOUS SOURCE FOR YOUR DIARRHEA WAS IDENTIFIED. YOU HAVE GASTRITIS. You have DIVERTICULOSIS IN YOUR RIGHT AND LEFT COLON.  I BIOPSIED YOUR STOMACH, SMALL BOWEL, AND COLON.     AVOID ITEMS THAT TRIGGER GASTRITIS. SEE INFO BELOW  FOLLOW A DAIRY FREE/HIGH FIBER DIET. AVOID ITEMS THAT CAUSE BLOATING. SEE INFO BELOW ON A HIGH FIBER DIET.  USE BENTYL 10 MG 1-2 30 MINS PRIOR TO MEALS(8A, 12N, 4P) AND AT BEDTIME(8P). IT MAY CAUSE DROWSINESS, DRY EYES/MOUTH, BLURRY VISION, OR DIFFICULTY URINATING.  YOUR BIOPSY RESULTS WILL BE AVAILABLE IN MY CHART AFTER  OCT 14 AND MY OFFICE WILL CONTACT YOU IN 10-14 DAYS WITH YOUR RESULTS.   FOLLOW UP IN 3 MOS.   ENDOSCOPY Care After Read the instructions outlined below and refer to this sheet in the next week. These discharge instructions provide you with general information on caring for yourself after you leave the hospital. While your treatment has been planned according to the most current medical practices available, unavoidable complications occasionally occur. If you have any problems or questions after discharge, call DR. Adriann Thau, [phone].  ACTIVITY  You may resume your regular activity, but move at a slower pace for the next 24 hours.   Take frequent rest periods for the next 24 hours.   Walking will help get rid of the air and reduce the bloated feeling in your belly (abdomen).   No driving for 24 hours (because of the medicine (anesthesia) used during the test).   You may shower.   Do not sign any important legal documents or operate any machinery for 24 hours (because of the anesthesia used during the test).    NUTRITION  Drink plenty of fluids.   You may resume your normal diet as instructed by your doctor.   Begin with a light meal and progress to your normal diet. Heavy or fried foods are harder to digest and may make you feel sick to your stomach (nauseated).   Avoid alcoholic beverages for 24 hours or as instructed.     MEDICATIONS  You may resume your normal medications.   WHAT YOU CAN EXPECT TODAY  Some feelings of bloating in the abdomen.   Passage of more gas than usual.   Spotting of blood in your stool or on the toilet paper  .  IF YOU HAD POLYPS REMOVED DURING THE ENDOSCOPY:  Eat a soft diet IF YOU HAVE NAUSEA, BLOATING, ABDOMINAL PAIN, OR VOMITING.    FINDING OUT THE RESULTS OF YOUR TEST Not all test results are available during your visit. DR. Oneida Alar WILL CALL YOU WITHIN 14 DAYS OF YOUR PROCEDUE WITH YOUR RESULTS. Do not assume everything is normal if you have not heard from DR. Anjela Cassara, CALL HER OFFICE AT [phone].  SEEK IMMEDIATE MEDICAL ATTENTION AND CALL THE OFFICE: [phone] IF:  You have more than a spotting of blood in your stool.   Your belly is swollen (abdominal distention).   You are nauseated or vomiting.   You have a temperature over 101F.   You have abdominal pain or discomfort that is severe or gets worse throughout the day.   Gastritis  Gastritis is an inflammation (the body's way of reacting to injury and/or infection) of the stomach. It is often caused by bacterial (germ) infections. It can also be caused BY ASPIRIN, BC/GOODY POWDER'S, (IBUPROFEN) MOTRIN, OR ALEVE (NAPROXEN), chemicals (including alcohol), SPICY FOODS, and medications. This illness may be associated with generalized malaise (feeling tired, not well), UPPER ABDOMINAL STOMACH cramps,  and fever. One common bacterial cause of gastritis is an organism known as H. Pylori. This can be treated with antibiotics.    High-Fiber Diet A high-fiber diet changes your normal diet to include more whole grains, legumes, fruits, and vegetables. Changes in the diet involve replacing refined carbohydrates with unrefined foods. The calorie level of the diet is essentially unchanged. The Dietary Reference Intake (recommended amount) for adult males is 38 grams per day. For adult females, it is 25 grams per  day. Pregnant and lactating women should consume 28 grams of fiber per day. Fiber is the intact part of a plant that is not broken down during digestion. Functional fiber is fiber that has been isolated from the plant to provide a beneficial effect in the body. PURPOSE  Increase stool bulk.   Ease and regulate bowel movements.   Lower cholesterol.  REDUCE RISK OF COLON CANCER  INDICATIONS THAT YOU NEED MORE FIBER  Constipation and hemorrhoids.   Uncomplicated diverticulosis (intestine condition) and irritable bowel syndrome.   Weight management.   As a protective measure against hardening of the arteries (atherosclerosis), diabetes, and cancer.   DO NOT USE WITH:  Acute diverticulitis (intestine infection).   Partial small bowel obstructions.   Complicated diverticular disease involving bleeding, rupture (perforation), or abscess (boil, furuncle).   Presence of autonomic neuropathy (nerve damage) or gastroparesis (stomach cannot empty itself).   GUIDELINES FOR INCREASING FIBER IN THE DIET  Start adding fiber to the diet slowly. A gradual increase of about 5 more grams (2 slices of whole-wheat bread, 2 servings of most fruits or vegetables, or 1 bowl of high-fiber cereal) per day is best. Too rapid an increase in fiber may result in constipation, flatulence, and bloating.   Drink enough water and fluids to keep your urine clear or pale yellow. Water, juice, or caffeine-free drinks are recommended. Not drinking enough fluid may cause constipation.   Eat a variety of high-fiber foods rather than one type of fiber.   Try to increase your intake of fiber through using high-fiber foods rather than fiber pills or supplements that contain small amounts of fiber.   The goal is to change the types of food eaten. Do not supplement your present diet with high-fiber foods, but replace foods in your present diet.    INCLUDE A VARIETY OF FIBER SOURCES  Replace refined and processed  grains with whole grains, canned fruits with fresh fruits, and incorporate other fiber sources. White rice, white breads, and most bakery goods contain little or no fiber.   Brown whole-grain rice, buckwheat oats, and many fruits and vegetables are all good sources of fiber. These include: broccoli, Brussels sprouts, cabbage, cauliflower, beets, sweet potatoes, white potatoes (skin on), carrots, tomatoes, eggplant, squash, berries, fresh fruits, and dried fruits.   Cereals appear to be the richest source of fiber. Cereal fiber is found in whole grains and bran. Bran is the fiber-rich outer coat of cereal grain, which is largely removed in refining. In whole-grain cereals, the bran remains. In breakfast cereals, the largest amount of fiber is found in those with "bran" in their names. The fiber content is sometimes indicated on the label.   You may need to include additional fruits and vegetables each day.   In baking, for 1 cup white flour, you may use the following substitutions:   1 cup whole-wheat flour minus 2 tablespoons.   1/2 cup white flour plus 1/2 cup whole-wheat flour.

## 2015-09-28 NOTE — H&P (View-Only) (Signed)
Subjective:    Patient ID: Rachel Sutton, female    DOB: 02/02/1961, 54 y.o.   MRN: [id number] HAWKINS,EDWARD L, MD  HPI PRIOR TO AUG 2016: GET UP AND HAVE COFFEE AND THEN HAVE BM(#4). AUG 2016: NOTICED CHANGE IN BOWEL HABOTSWoke up with diarrhea SEP 21. Thought it was a stomach virus. Went to DR. HAWKINS FOLLOWING MON. STOOLS SAMPLES NEGATIVE. TRIED ABX FOR PARASITE OR VIRUS. WENT TO ED FOR BAD STOMACH PAIN. GIVEN IVFs AND PAIN MEDS AND SENT HOME. CT SCAN NEGATIVE. BMs: 3 TIMES IN OFC. DURING DAY 10-20(WATERY STOOLS) DURING DAY AND 2-3 TIMES AT NIGHT. DRINKING GATORADE. TEMP: NO MORE THAN 100 F. No sick contacts AT [facility]. Has dogs. NO PRIOR ABX BEFORE DIARRHEA TSTARTED. NO WELL WATER OR TRAVEL. NO NEW MEDS OR OTC SUPPLEMENTS. MILK: EVERY DAY AT LEAST 1 8OZ GLASS. CHEESE: 2-3 TIMES A WEEK. ICE CREAM: RARE. NAUSEA: OFF AND ON-WORSE. DRY HEAVES: 2-3 TIMES A WEEK. HAS KNNE PAIN BUT NO WORSE. UPPER ABDOMINAL PAIN: SHARP/CRAMPING, BOTH LUQ/RUQ AND MAY RADIATE TO LEFT BACK. LASTS MINS. ABDOMINAL PAIN EVERY DAY. 30 MINS AFTER SHE EATS SHE'S BLOATED. FEELS FULL FAST. PAIN SAME SINCE SEP 2016. LAST TCS 6-7 YRS AGO: DUE TO HX BREAST CA. LAST EGD 2013: CHEST PAIN. IMODIUM DIDN'T HELP. DIDN'T TRY BENTYL. WHEN SHE HAS A BM ABDOMINAL PAIN GETS BETTER. ONE ASA DAILY SINCE 2011. NO BC/GOODY POWDERS, IBUPROFEN/MOTRIN, OR NAPROXEN/ALEVE. HASN'T TAKEN ANYTHING FOR NAUSEA. JUST DEALS WITH IT. NO THYROID TEST DONE. LOSES CONTROLS OF HER BOWEL AND HAS SOILING 2-3 TIMES A WEEK AND SOMETIMES A DAY.  PT DENIES FEVER, CHILLS, HEMATOCHEZIA, HEMATEMESIS, vomiting, melena, CHEST PAIN, SHORTNESS OF BREATH, problems swallowing, problems with sedation, OR heartburn or indigestion. NO SORES IN MOUTH, RASH ON LEGS, JOINT PAIN, OR BACK PAIN. STRESS: TREATMENT FOR ANXIETY AND DEPRESSION SINCE LAST YEAR(HUSBAND PASSED 2015-CHF/COPD).  Past Medical History  Diagnosis Date  . Fuchs' corneal dystrophy   . Essential hypertension,  benign   . Mixed hyperlipidemia   . Mitral valve prolapse     Mild mitral regurgitation  . GERD (gastroesophageal reflux disease)   . Arthritis   . Anxiety   . Breast cancer ([facility]) 02/22/2012    Right T1c, N0  . Depression    Past Surgical History  Procedure Laterality Date  . Carpal tunnel r/l  1992  . Ray cage fusion  1997  . Cervical spine fusion x2 since last visit    . Elbow surgery   tennis elbow release 2012  . Abdominal hysterectomy  1999  . Sentinel lymph node biopsy  10/30/2002    right  . Ovarian cyst removal      prior to hysterectomy, right  . Breast lumpectomy  10/30/2002    right  . Esophagogastroduodenoscopy  10/26/2012    Procedure: ESOPHAGOGASTRODUODENOSCOPY (EGD);  Surgeon: Inda Castle, MD;  Location: [facility];  Service: Endoscopy;  Laterality: N/A;  . Left heart catheterization with coronary angiogram N/A 10/23/2012    Procedure: LEFT HEART CATHETERIZATION WITH CORONARY ANGIOGRAM;  Surgeon: Peter M Martinique, MD;  Location: [facility] CATH LAB;  Service: Cardiovascular;  Laterality: N/A;   Allergies  Allergen Reactions  . Bee Venom Anaphylaxis  . Haloperidol Lactate Other (See Comments)    Convulsions   . Meperidine Hcl Hives    Current Outpatient Prescriptions  Medication Sig Dispense Refill  . ALPRAZolam (XANAX) 1 MG tablet Patient taking differently: Take 1 mg by mouth 2 (two) times daily.     Marland Kitchen  aspirin EC 81 MG EC tablet Take 1 tablet (81 mg total) by mouth daily.    . Biotin 5 MG TABS Take 1 tablet by mouth daily.    . Calcium-Magnesium-Vitamin D (CALCIUM 500 PO) Take 1 tablet by mouth 2 (two) times daily.    Marland Kitchen dicyclomine (BENTYL) 20 MG tablet Take 1 tablet (20 mg total) by mouth 2 (two) times daily.    Marland Kitchen EPINEPHrine (EPI-PEN) 0.3 mg/0.3 mL DEVI Inject 0.3 mg into the muscle as needed. For bee venom    . fish oil-omega-3 fatty acids 1000 MG capsule Take 2 g by mouth every morning.    . hydrochlorothiazide (MICROZIDE) 12.5 MG capsule TAKE 1 CAPSULE BY  MOUTH EVERY DAY PRN   . metoprolol succinate (TOPROL-XL) 50 MG 24 hr tablet May take 75 mg ( 1 1/2 tablets) daily for increased palpitations per mD    . Multiple Vitamin (MULITIVITAMIN WITH MINERALS) TABS Take 1 tablet by mouth every morning.    Marland Kitchen NIFEdipine (PROCARDIA XL/ADALAT-CC) 90 MG 24 hr tablet Take 1 tablet (90 mg total) by mouth daily.    . nitroGLYCERIN (NITROSTAT) 0.4 MG SL tablet Place 1 tablet (0.4 mg total) under the tongue every 5 (five) minutes x 3 doses as needed for chest pain.    Marland Kitchen PARoxetine (PAXIL) 20 MG tablet Take 1.5 tablets (30 mg total) by mouth daily.    . simvastatin (ZOCOR) 40 MG tablet Take 40 mg by mouth every morning.     . sodium chloride (MURO 128) 5 % ophthalmic solution Place 1 drop into both eyes as needed for irritation.     . traMADol (ULTRAM) 50 MG tablet Take 50 mg by mouth every 6 (six) hours as needed for moderate pain.     .      .      . promethazine (PHENERGAN) 25 MG tablet Take 1 tablet (25 mg total) by mouth every 6 (six) hours as needed for nausea or vomiting. (Patient not taking: Reported on 09/22/2015)       Family History  Problem Relation Age of Onset  . Diabetes Mother   . Pancreatic cancer Mother   . Lung cancer Father   . Diabetes Father   . Heart disease Father   . Esophageal cancer Father 46    Beer drinker  . Brain cancer Brother 35  . Brain cancer Maternal Grandfather   . Heart disease Paternal Grandfather   . Anesthesia problems Neg Hx   . Hypotension Neg Hx   . Malignant hyperthermia Neg Hx   . Pseudochol deficiency Neg Hx   . Cancer Maternal Uncle     unknown form of cancer; cigar smoker  . Lung cancer Maternal Grandmother   . Brain cancer Cousin     dx in his late 45s-40s  . Schizophrenia Sister    Social History  Substance Use Topics  . Smoking status: Current Every Day Smoker -- 0.25 packs/day for 35 years    Types: Cigarettes    Start date: 07/07/1974    Last Attempt to Quit: 06/30/2001  . Smokeless tobacco:  Never Used  . Alcohol Use: 0.0 oz/week    0 Standard drinks or equivalent per week     Comment: Rare( stopped July 26, 2014)   Review of Systems PER HPI OTHERWISE ALL SYSTEMS ARE NEGATIVE.    Objective:   Physical Exam  Constitutional: She is oriented to person, place, and time. She appears well-developed and well-nourished. No distress.  HENT:  Head: Normocephalic and atraumatic.  Mouth/Throat: Oropharynx is clear and moist. No oropharyngeal exudate.  NO ORAL LESIONS/ULCERS  Eyes: Pupils are equal, round, and reactive to light. No scleral icterus.  Neck: Normal range of motion. Neck supple.  Cardiovascular: Normal rate, regular rhythm and normal heart sounds.   Pulmonary/Chest: Effort normal and breath sounds normal. No respiratory distress.  Abdominal: Soft. Bowel sounds are normal. She exhibits no distension. There is tenderness. There is no rebound and no guarding.  MILD RUQ/LUQ TTP   Musculoskeletal: She exhibits no edema.  Lymphadenopathy:    She has no cervical adenopathy.  Neurological: She is alert and oriented to person, place, and time.  NO FOCAL DEFICITS   Skin:  NO PRETIBIAL LESIONS   Psychiatric: She has a normal mood and affect.  Vitals reviewed.         Assessment & Plan:

## 2015-09-29 ENCOUNTER — Encounter (HOSPITAL_COMMUNITY): Payer: Self-pay | Admitting: Gastroenterology

## 2015-09-29 ENCOUNTER — Telehealth: Payer: Self-pay

## 2015-09-29 ENCOUNTER — Ambulatory Visit (HOSPITAL_COMMUNITY): Payer: Self-pay | Admitting: Psychiatry

## 2015-09-29 LAB — OSMOLALITY, STOOL: Osmolality, Feces: 452 mOsm

## 2015-09-29 NOTE — Telephone Encounter (Signed)
-----   Message from Satira Sark, MD sent at 09/29/2015  3:24 PM EDT ----- Could try to change Toprol XL to 50 mg BID (instead of 75 mg daily).  ----- Message -----    From: Drema Dallas, CMA    Sent: 09/29/2015   3:15 PM      To: Satira Sark, MD  Pt still complaining of palpitations. Please advise.  ----- Message -----    From: Danie Binder, MD    Sent: 09/27/2015   7:49 AM      To: Satira Sark, MD, Drema Dallas, CMA  DIARRHEA IS BEING WORKED UP BUT IS MOST LIKELY DUE TO IBS-D. NEVER TOLD PT ANYTHING ABOUT HER METOPROLOL NOT BEING ABSORBED. HAVE NO IDEA WHAT SHE'S TALKING ABOUT.  ----- Message -----    From: Satira Sark, MD    Sent: 09/26/2015   4:35 PM      To: Danie Binder, MD, Drema Dallas, CMA  Can we find out more details about this from Dr. Oneida Alar? Is the diarrhea a reversible issue? Is it truly a problem with Toprol XL not being absorbed - what about the other mediations she is taking? Would it be expected that a short acting beta blocker might be better absorbed than a long acting formulation?   ----- Message -----    From: Drema Dallas, CMA    Sent: 09/26/2015  11:00 AM      To: Satira Sark, MD  Patient came in office today( [phone] ) stating that for the past week or so she has been having palpitations. She claims she has been having diarrhea since Sept. 21st and she is seeing a GI doctor in which she had to do a 24 hr stool sample for, and the doctor called her to let her know that her metoprolol is not dissolving. She wants to know if you can give her another medication???

## 2015-09-29 NOTE — Telephone Encounter (Signed)
Let Pt know to take 50 mg of Metoprolol two times daily to help control her palps. I asked her to call me back in a week or so and let me know if it is helping.

## 2015-09-29 NOTE — Op Note (Signed)
[facility] [address] [place], [address]   ENDOSCOPY PROCEDURE REPORT  PATIENT: Rachel Sutton, Rachel Sutton  MR#: [id number] BIRTHDATE: [birth year] , 89  yrs. old GENDER: female  ENDOSCOPIST: Danie Binder, MD REFERRED [id number] Hawkins, M.D.  PROCEDURE DATE: 09/28/2015 PROCEDURE:   EGD w/ biopsy  INDICATIONS:diarrhea.   dyspepsia. MEDICATIONS: Monitored anesthesia care TOPICAL ANESTHETIC:   Viscous Xylocaine ASA CLASS:  DESCRIPTION OF PROCEDURE:     Physical exam was performed.  Informed consent was obtained from the patient after explaining the benefits, risks, and alternatives to the procedure.  The patient was connected to the monitor and placed in the left lateral position.  Continuous oxygen was provided by nasal cannula and IV medicine administered through an indwelling cannula.  After administration of sedation, the patients esophagus was intubated and the     endoscope was advanced under direct visualization to the second portion of the duodenum.  The scope was removed slowly by carefully examining the color, texture, anatomy, and integrity of the mucosa on the way out.  The patient was recovered in endoscopy and discharged home in satisfactory condition.  Estimated blood loss is zero unless otherwise noted in this procedure report.     ESOPHAGUS: The mucosa of the esophagus appeared normal.  STOMACH: Mild non-erosive gastritis (inflammation) was found in the gastric antrum.  Multiple biopsies were performed using cold forceps. DUODENUM: Mild duodenal inflammation was found in the duodenal bulb.   The duodenal mucosa showed no abnormalities in the 2nd part of the duodenum.  Cold forceps biopsies were taken in the bulb and second portion. COMPLICATIONS: There were no immediate complications.  ENDOSCOPIC IMPRESSION: 1.   NO OBVIOUS SOURCE FOR DIARRHEA IDENTIFIED 2.   MILD Non-erosive gastritis AND DUODENITIS  RECOMMENDATIONS: AVOID ITEMS THAT TRIGGER  GASTRITIS. FOLLOW A DAIRY FREE/HIGH FIBER DIET. BENTYL 10 MG 1-2 30 MINS PRIOR TO MEALS(8A, 12N, 4P) AND AT BEDTIME(8P). AWAIT BIOPSY RESULTS. FOLLOW UP IN 3 MOS.  REPEAT EXAM: eSigned:  Danie Binder, MD 10/19/15 11:48 AM   CPT CODES: ICD CODES:  The ICD and CPT codes recommended by this software are interpretations from the data that the clinical staff has captured with the software.  The verification of the translation of this report to the ICD and CPT codes and modifiers is the sole responsibility of the health care institution and practicing physician where this report was generated.  [employer] will not be held responsible for the validity of the ICD and CPT codes included on this report.  AMA assumes no liability for data contained or not contained herein. CPT is a Designer, television/film set of the [employer].

## 2015-10-03 ENCOUNTER — Telehealth: Payer: Self-pay

## 2015-10-03 ENCOUNTER — Encounter: Payer: Self-pay | Admitting: Gastroenterology

## 2015-10-03 NOTE — Telephone Encounter (Signed)
Pt is aware of what SLF said 

## 2015-10-03 NOTE — Telephone Encounter (Signed)
Pt is calling because the Bentyl is not working. She is still having diarrhea. Her stomach is still hurting also.When she eats or drinks water this goes right through her. Please advise

## 2015-10-03 NOTE — Telephone Encounter (Signed)
PLEASE CALL PT. SHE HAS LYMPHOCYTIC COLITIS. I SENT HER A MESSAGE VIA MYCHART ON FRI. SHE NEEDS PEPTO BISMOL 3 TABELTS TID FOR 8 WEEKS. SHE SHOULD SEE IMPROVEMENT IN HER DIARRHEA OVER THE NEXT 7 DAYS SHE MAY CONTINUE TO USE BENTYL TO SLOW DOWN DIARRHEA. HOLD FOR CONSTIPATION. SHE SHOULD AVOID DAIRY, FATTY FOODS, AND CAFFEINE FOR THE NEXT 8 WEEKS. CALL IN 2 WEEKS IF HER DIARRHEA IS NOT IMPROVED. NEXT TCS IN 10 YEARS.

## 2015-10-05 ENCOUNTER — Encounter: Payer: Self-pay | Admitting: Gastroenterology

## 2015-10-05 MED ORDER — DICYCLOMINE HCL 10 MG PO CAPS: ORAL_CAPSULE | ORAL | Status: DC

## 2015-10-06 ENCOUNTER — Other Ambulatory Visit: Payer: Self-pay

## 2015-10-10 ENCOUNTER — Telehealth: Payer: Self-pay | Admitting: Gastroenterology

## 2015-10-10 NOTE — Telephone Encounter (Signed)
PLEASE CALL PT. SHE SHOULD CONTINUE HER THERAPY. IF SHE IS NOT BETTER BY OCT 30. SHE SHOULD CALL THE OFFICE AND WE WILL NEED TO CONSIDER ANOTHER MEDICATION.

## 2015-10-10 NOTE — Telephone Encounter (Signed)
Patient had new patient visit on 10/6 and procedure on 10/12. She called today saying that she isn't any better and was told to call and make follow up OV with SF and she needed to be seen this week. I told her that our next available would be in November and SF next available is Nov 16th. Please advise and call patient at [phone]

## 2015-10-10 NOTE — Telephone Encounter (Signed)
PT said she ahs been doing everything Dr. Oneida Alar asked her to do. Pepto 3 tablets tid, taking the Bentyl. She has not had dairy, fatty foods, nor caffeine. She is having diarrhea 10-15 times a day, watery. Please advise!

## 2015-10-11 ENCOUNTER — Ambulatory Visit (HOSPITAL_COMMUNITY): Payer: Self-pay | Admitting: Psychiatry

## 2015-10-11 NOTE — Telephone Encounter (Signed)
Pt is aware.  

## 2015-10-14 ENCOUNTER — Telehealth: Payer: Self-pay

## 2015-10-14 MED ORDER — METOPROLOL SUCCINATE ER 50 MG PO TB24: 50.0000 mg | ORAL_TABLET | Freq: Two times a day (BID) | ORAL | Status: DC

## 2015-10-14 NOTE — Telephone Encounter (Signed)
Pt called to request Toprol to 50 mg twice a day.Dr Domenic Polite had already approved,sent new rx to [facility]

## 2015-10-17 ENCOUNTER — Telehealth: Payer: Self-pay | Admitting: Gastroenterology

## 2015-10-17 MED ORDER — BUDESONIDE 3 MG PO CPEP: 9.0000 mg | ORAL_CAPSULE | Freq: Every day | ORAL | Status: DC

## 2015-10-17 NOTE — Telephone Encounter (Signed)
PATIENT CALLED AND STATED THAT SHE IS STILL NOT BETTER AND WAS TOLD TO CALL IF SHE WASN'T.  [id number]

## 2015-10-17 NOTE — Addendum Note (Signed)
Addended by: Danie Binder on: 10/17/2015 04:27 PM   Modules accepted: Orders, Medications

## 2015-10-17 NOTE — Telephone Encounter (Signed)
Pt said she is still having diarrhea daily. It starts about 5:30 AM and continues about every hour til noon. Then several times in the afternoon. She continues to have abdominal cramps and also light yellow drainage from her rectum.   She was supposed to go back to work tomorrow and she said she cannot go back like this. This has been going on since 09/07/2015.  Please advise!

## 2015-10-17 NOTE — Telephone Encounter (Signed)
PLEASE CALL PT. SHE CAN STOP PEPTO BISMOL AND START ENTOCORT  MG DAILY FOR 8 WEEKS.RX SENT TO [facility].  SHE MAY USE IMODIUM OR PEPTO BISMOL AS NEEDED FOR DIARRHEA.SHE SHOULD CALL IN 2 WEEKS IF HER SYMPTOMS ARE NOT IMPROVED. SHE SHOULD SEE HER HR PEOPLE FOR FMLA PAPERWORK.

## 2015-10-18 ENCOUNTER — Encounter: Payer: Self-pay | Admitting: Gastroenterology

## 2015-10-18 ENCOUNTER — Telehealth (HOSPITAL_COMMUNITY): Payer: Self-pay | Admitting: *Deleted

## 2015-10-18 NOTE — Telephone Encounter (Signed)
Spoke with pt and she stated that her pharmacy informed her that Dr. Harrington Challenger can send them an electronic script for her Xanax. Per pt, she is not completely out of her medication but they just did not want any interruptions in her getting her script due to it being a mail order. Informed pt that this medication is a controlled substance and it is printed and given to her. Per pt, she is aware and did informed them but that's what they told her. Informed pt of her appt and pt did agree that during her visit with Dr. Harrington Challenger, she will discuss this with her. Per pt, she will be at her appt.

## 2015-10-18 NOTE — Telephone Encounter (Signed)
Called patient TO DISCUSS CONCERNS. LVM-CALL [phone] TO DISCUSS.  

## 2015-10-18 NOTE — Telephone Encounter (Signed)
phone call from [facility] regarding Alprazelam 1 mg.  their fax # is [phone].

## 2015-10-18 NOTE — Telephone Encounter (Signed)
Pt need work note to excuse her from [place] 1-NOV 17. I will put you out for 2 more weeks. 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

## 2015-10-18 NOTE — Telephone Encounter (Signed)
PT is aware. She said she needs a note for now since she was supposed to go back to work today. If the note can be attached to her MY CHART she can get the copy and send it to HR. If not, fax to her at [phone], that is her home number and also her fax number. She said when HR gets an updated note from Dr. Orland Jarred, they will fax the paperwork for FMLA to her to be completed.

## 2015-10-18 NOTE — Telephone Encounter (Signed)
Tried to call pt to inform her and could not reach her.  Note faxed to her.

## 2015-10-19 ENCOUNTER — Encounter (HOSPITAL_COMMUNITY): Payer: Self-pay | Admitting: Psychiatry

## 2015-10-19 ENCOUNTER — Telehealth: Payer: Self-pay | Admitting: Gastroenterology

## 2015-10-19 ENCOUNTER — Ambulatory Visit (INDEPENDENT_AMBULATORY_CARE_PROVIDER_SITE_OTHER): Payer: Managed Care, Other (non HMO) | Admitting: Psychiatry

## 2015-10-19 VITALS — BP 113/66 | HR 85 | Ht 64.0 in | Wt 176.2 lb

## 2015-10-19 DIAGNOSIS — F322 Major depressive disorder, single episode, severe without psychotic features: Secondary | ICD-10-CM

## 2015-10-19 DIAGNOSIS — F329 Major depressive disorder, single episode, unspecified: Secondary | ICD-10-CM

## 2015-10-19 MED ORDER — PAROXETINE HCL 20 MG PO TABS: 30.0000 mg | ORAL_TABLET | Freq: Every day | ORAL | Status: DC

## 2015-10-19 MED ORDER — ALPRAZOLAM 1 MG PO TABS: 1.0000 mg | ORAL_TABLET | Freq: Four times a day (QID) | ORAL | Status: DC

## 2015-10-19 NOTE — Telephone Encounter (Signed)
Called patient TO DISCUSS CONCERNS. WHOLE THING HAS HER NERVOUS. WORRIED IT WAS FROM PANCREAS. REASSURED PT HER CONDITION IS NOT RELATED TO HER PANCREAS AND THIS DISEASE IN UNCOMMON BUT NOT UNUSUAL. FMLA PAPERWORK WILL BE READY BY 2 PM NOV 3.  SHE IS AWARE I WILL OUT OF THE OFFICE FROM NOV 4-8 AND WILL RETURN NOV 9. IF SHE NEEDS ASSISTANCE SHE CAN CALL DORIS OR ANNA.
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
## 2015-10-19 NOTE — Telephone Encounter (Signed)
Noted  

## 2015-10-19 NOTE — Telephone Encounter (Signed)
FMLA papers were faxed to [place] and I placed them in [facility] chair.

## 2015-10-20 ENCOUNTER — Encounter: Payer: Self-pay | Admitting: Gastroenterology

## 2015-10-20 NOTE — Telephone Encounter (Signed)
Pt asked for me to mail her the original FMLA papers. I verified her address and papers are up front for the courier.

## 2015-10-20 NOTE — Telephone Encounter (Signed)
PT called and asked if we received the FMLA papers and I told her yes and Dr. Oneida Alar had said they would be ready after 2:00 pm today. Please call pt when ready and she wants to know if there is another charge can we take a payment over the phone.

## 2015-10-20 NOTE — Telephone Encounter (Signed)
FMLA PAPERWORK COMPLETE. SUSAN WILL CONTACT PT TO PICK UP. WILL FAX PATH REPORT AND TC OCT 31. NO NEED TO CHARGE. PT IS GETTING AN EXTENSION FOR SAME PROBLEM.

## 2015-10-20 NOTE — Telephone Encounter (Signed)
FMLA papers, Path report and last office note were faxed to [phone] attn: Roxan Diesel. Copies were made to go to scanning and original papers are up front for the patient to pick up. No charge per [facility]

## 2015-10-20 NOTE — Telephone Encounter (Signed)
REVIEWED & PPWK COMPLETE.

## 2015-10-26 ENCOUNTER — Telehealth: Payer: Self-pay

## 2015-10-26 NOTE — Telephone Encounter (Signed)
Pt called and said the Entocort is working really good. But she said the insurance co will not accept the fact that pt has not had another OV.  Dr. Oneida Alar has her out until 18th.  They said she much have an ov in order to get pd. I told her that Dr. Oneida Alar will be back tomorrow and I will speak with her in the morning and let her know what she needs to do.

## 2015-10-27 ENCOUNTER — Ambulatory Visit (INDEPENDENT_AMBULATORY_CARE_PROVIDER_SITE_OTHER): Payer: Managed Care, Other (non HMO) | Admitting: Gastroenterology

## 2015-10-27 ENCOUNTER — Encounter: Payer: Self-pay | Admitting: Gastroenterology

## 2015-10-27 VITALS — BP 119/73 | HR 72 | Temp 97.2°F | Ht 64.0 in | Wt 179.2 lb

## 2015-10-27 DIAGNOSIS — K52831 Collagenous colitis: Secondary | ICD-10-CM

## 2015-10-27 NOTE — Telephone Encounter (Signed)
Per Dr. Oneida Alar she will see pt today at 11:30. I called pt and told her to be here at 11:15 am and she said that she will.

## 2015-10-27 NOTE — Patient Instructions (Addendum)
COMPLETE ENTOCORT FOR 8 WEEKS.  I WILL COMPLETE PAPERWORK BeFORE NOV 21.  FOLLOW UP IN 3 MOS.

## 2015-10-27 NOTE — Progress Notes (Signed)
CC'ED TO PCP 

## 2015-10-27 NOTE — Progress Notes (Signed)
ON RECALL  °

## 2015-10-27 NOTE — Progress Notes (Signed)
On recall  °

## 2015-10-27 NOTE — Telephone Encounter (Signed)
SEE AS OPV TODAY

## 2015-10-27 NOTE — Assessment & Plan Note (Signed)
CLINICALLY IMPROVED. NO MED SIDE EFFECTS.  COMPLETE ENTOCORT FOR 8 WEEKS. NEEDS ST DISABILITY PAPERWORK FILLED OUT BY NOV 21. FOLLOW UP IN 3 MOS.

## 2015-10-27 NOTE — Progress Notes (Signed)
Subjective:    Patient ID: Rachel Sutton, female    DOB: 05-17-1961, 54 y.o.   MRN: [id number]  Alonza Bogus, MD  HPI CHANGE ION BOWEL HABITS. FAILED PEPTO. ON ENTOCOCRT FOR 9 DAYS. SYMPTOMS IMPROVED AFTER 3 DAYS. STOOLS: 5 A DAY(SOFT/LOOSE). NOT UP AT NIGHT TO HAVE A BM. TRIED IMODIUM AT THE BEGINNING.   PT DENIES FEVER, CHILLS, HEMATOCHEZIA, HEMATEMESIS, nausea, vomiting, melena, diarrhea, CHEST PAIN, SHORTNESS OF BREATH, CHANGE IN BOWEL IN HABITS, constipation, abdominal pain, problems swallowing, OR heartburn or indigestion.  Past Medical History  Diagnosis Date  . Fuchs' corneal dystrophy   . Essential hypertension, benign   . Mixed hyperlipidemia   . Mitral valve prolapse     Mild mitral regurgitation  . GERD (gastroesophageal reflux disease)   . Arthritis   . Anxiety   . Breast cancer ([place]) 02/22/2012    Right T1c, N0  . Depression   . Seizures ([place])     only with dose of haldol; no more since that was stopped.  . Colitis, collagenous 09/22/2015   Past Surgical History  Procedure Laterality Date  . Carpal tunnel r/l  1992  . Ray cage fusion  1997  . Cervical spine fusion x2 since last visit    . Elbow surgery Right  tennis elbow release 2012  . Abdominal hysterectomy  1999  . Sentinel lymph node biopsy  10/30/2002    right  . Ovarian cyst removal      prior to hysterectomy, right  . Breast lumpectomy  10/30/2002    right  . Esophagogastroduodenoscopy  10/26/2012    Procedure: ESOPHAGOGASTRODUODENOSCOPY (EGD);  Surgeon: Inda Castle, MD;  Location: [place];  Service: Endoscopy;  Laterality: N/A;  . Left heart catheterization with coronary angiogram N/A 10/23/2012    Procedure: LEFT HEART CATHETERIZATION WITH CORONARY ANGIOGRAM;  Surgeon: Peter M Martinique, MD;  Location: [facility] CATH LAB;  Service: Cardiovascular;  Laterality: N/A;  . Knee arthroscopy Left   . Colonoscopy with propofol N/A 09/28/2015    Procedure: COLONOSCOPY WITH PROPOFOL;  Surgeon: Danie Binder, MD;  Location: [facility];  Service: Endoscopy;  Laterality: N/A;  procedure 1 cecum time in 1232  time out 1246   total time 14 mintes  . Esophagogastroduodenoscopy (egd) with propofol N/A 09/28/2015    Procedure: ESOPHAGOGASTRODUODENOSCOPY (EGD) WITH PROPOFOL;  Surgeon: Danie Binder, MD;  Location: [facility];  Service: Endoscopy;  Laterality: N/A;  . Esophageal biopsy N/A 09/28/2015    Procedure: BIOPSY;  Surgeon: Danie Binder, MD;  Location: [facility];  Service: Endoscopy;  Laterality: N/A;   Allergies  Allergen Reactions  . Bee Venom Anaphylaxis  . Haloperidol Lactate Other (See Comments)    Convulsions   . Meperidine Hcl Hives    Current Outpatient Prescriptions  Medication Sig Dispense Refill  . ALPRAZolam (XANAX) 1 MG tablet Take 1 tablet (1 mg total) by mouth 4 (four) times daily.    Marland Kitchen aspirin EC 81 MG EC tablet Take 1 tablet (81 mg total) by mouth daily.    . Biotin 5 MG TABS Take 1 tablet by mouth daily.    . budesonide (ENTOCORT EC) 3 MG 24 hr capsule Take 3 capsules (9 mg total) by mouth daily.    . Calcium-Magnesium-Vitamin D (CALCIUM 500 PO) Take 1 tablet by mouth 2 (two) times daily.    Marland Kitchen EPINEPHrine (EPI-PEN) 0.3 mg/0.3 mL DEVI Inject 0.3 mg into the muscle as needed. For bee venom    .  fish oil-omega-3 fatty acids 1000 MG capsule Take 2 g by mouth every morning.    . hydrochlorothiazide (MICROZIDE) 12.5 MG capsule TAKE 1 CAPSULE BY MOUTH EVERY DAY    . metoprolol succinate (TOPROL-XL) 50 MG 24 hr tablet Take 1 tablet (50 mg total) by mouth 2 (two) times daily. Take with or immediately following a meal.    . Multiple Vitamin (MULITIVITAMIN WITH MINERALS) TABS Take 1 tablet by mouth every morning.    Marland Kitchen NIFEdipine (PROCARDIA XL/ADALAT-CC) 90 MG 24 hr tablet Take 1 tablet (90 mg total) by mouth daily.    . pantoprazole (PROTONIX) 40 MG tablet Take 40 mg by mouth daily.     Marland Kitchen PARoxetine (PAXIL) 20 MG tablet Take 1.5 tablets (30 mg total) by mouth daily.    . simvastatin  (ZOCOR) 40 MG tablet Take 40 mg by mouth every morning.     . sodium chloride (MURO 128) 5 % ophthalmic solution Place 1 drop into both eyes as needed for irritation.     . traMADol (ULTRAM) 50 MG tablet Take 50 mg by mouth every 6 (six) hours as needed for moderate pain.     . nitroGLYCERIN (NITROSTAT) 0.4 MG SL tablet Place 1 tablet (0.4 mg total) under the tongue every 5 (five) minutes x 3 doses as needed for chest pain.     Review of Systems PER HPI OTHERWISE ALL SYSTEMS ARE NEGATIVE.    Objective:   Physical Exam  Constitutional: She is oriented to person, place, and time. She appears well-developed and well-nourished. No distress.  HENT:  Head: Normocephalic and atraumatic.  Mouth/Throat: Oropharynx is clear and moist. No oropharyngeal exudate.  Eyes: Pupils are equal, round, and reactive to light. No scleral icterus.  Neck: Normal range of motion. Neck supple.  Cardiovascular: Normal rate, regular rhythm and normal heart sounds.   Pulmonary/Chest: Effort normal and breath sounds normal. No respiratory distress.  Abdominal: Soft. Bowel sounds are normal. She exhibits no distension. There is no tenderness.  Musculoskeletal: She exhibits no edema.  Lymphadenopathy:    She has no cervical adenopathy.  Neurological: She is alert and oriented to person, place, and time.  NO  NEW FOCAL DEFICITS  Psychiatric: She has a normal mood and affect.  Vitals reviewed.         Assessment & Plan:

## 2015-10-27 NOTE — Progress Notes (Signed)
Disability paperwork and all encounters from OCT 31 to present faxed to [facility].

## 2015-10-31 ENCOUNTER — Encounter: Payer: Self-pay | Admitting: Gastroenterology

## 2015-11-28 ENCOUNTER — Other Ambulatory Visit: Payer: Self-pay

## 2015-11-28 MED ORDER — BUDESONIDE 3 MG PO CPEP: 9.0000 mg | ORAL_CAPSULE | Freq: Every day | ORAL | Status: DC

## 2015-12-05 ENCOUNTER — Other Ambulatory Visit: Payer: Self-pay

## 2015-12-05 NOTE — Telephone Encounter (Signed)
Please call the patient and ask him about his Entocort. He should be taking 3 pills a day for 8 weeks total. I just received a refill request. Dr. Oneida Alar sent in 4 weeks worth with 1 refill about 8 weeks ago. Magda Paganini refilled a request for 30 days 7 days ago. I want to make sure the pharmacy isn't just perpetually requesting refills.   Ask him how long he has been taking it and how many he has left.

## 2015-12-13 ENCOUNTER — Telehealth: Payer: Self-pay

## 2015-12-13 NOTE — Telephone Encounter (Signed)
Prior auth for Metoprolol succ ER 50 mg bid sent to [facility].

## 2015-12-14 ENCOUNTER — Telehealth: Payer: Self-pay

## 2015-12-14 NOTE — Telephone Encounter (Signed)
[facility] will not approve Metoprolol 50mg  bid. They are saying that a higher strength, with once daily dosing is more appropriate. Note sent to Dr. Gwen Her.

## 2015-12-15 NOTE — Telephone Encounter (Signed)
Pt said that the pharmacy should not have send that in. She was only on it for 8 weeks

## 2015-12-20 ENCOUNTER — Telehealth: Payer: Self-pay | Admitting: Gastroenterology

## 2015-12-20 NOTE — Telephone Encounter (Signed)
PATIENT CALLED AND NEEDS TO GIVE INFORMATION ON HER DISABILITY TO THE OFFICE  [phone]

## 2015-12-20 NOTE — Telephone Encounter (Signed)
Pt said her insurance co is giving her a hard time about paying for the last 17 days on her disability. They have decided to have an independent doctor review the files and she wanted to give Dr.Fields a heads up that she might get a phone call from them.

## 2015-12-20 NOTE — Telephone Encounter (Signed)
REVIEWED-NO ADDITIONAL RECOMMENDATIONS. 

## 2015-12-22 ENCOUNTER — Other Ambulatory Visit: Payer: Self-pay | Admitting: Cardiology

## 2015-12-23 ENCOUNTER — Telehealth: Payer: Self-pay | Admitting: Gastroenterology

## 2015-12-23 NOTE — Telephone Encounter (Signed)
There are some forms that were faxed to [place] in regards to patient and her disability claim. I have clipped them to a yellow folder and they are in your office chair.

## 2015-12-26 ENCOUNTER — Other Ambulatory Visit: Payer: Self-pay

## 2015-12-26 MED ORDER — PANTOPRAZOLE SODIUM 40 MG PO TBEC: 40.0000 mg | DELAYED_RELEASE_TABLET | Freq: Every day | ORAL | Status: DC

## 2015-12-26 NOTE — Telephone Encounter (Signed)
Contacted [facility]. AM SCHEDULED TO RECEIVE A CALL FROM DR. [place].

## 2015-12-28 ENCOUNTER — Ambulatory Visit: Payer: Self-pay | Admitting: Gastroenterology

## 2016-01-02 ENCOUNTER — Encounter: Payer: Self-pay | Admitting: Gastroenterology

## 2016-01-10 ENCOUNTER — Telehealth: Payer: Self-pay | Admitting: Gastroenterology

## 2016-01-10 ENCOUNTER — Encounter: Payer: Self-pay | Admitting: Gastroenterology

## 2016-01-10 DIAGNOSIS — R197 Diarrhea, unspecified: Secondary | ICD-10-CM

## 2016-01-10 NOTE — Telephone Encounter (Signed)
Spoke with the pt- she finished her entocort 3 weeks ago. Was doing well until 3 days ago and she started having diarrhea again. She said it has started just like it did last time and she cannot afford to be out of work for 2 months again. She is not having any fever or blood in her stool.   Pt wants to know if she needs to do another round of entocort. Pt uses [facility] in [place].

## 2016-01-10 NOTE — Telephone Encounter (Signed)
PATIENT CALLED AGAIN AND LEFT MESSAGE THAT HER CALL HAD NOT BEEN ADDRESSED YESTERDAY.  SHE IS HAVING A FLAIR UP AND WOULD LIKE TO SPEAK TO A NURSE.  PLEASE CALL

## 2016-01-11 NOTE — Telephone Encounter (Signed)
Pt had left a Vm and I called and told her that Almyra Free has sent a note to Dr. Oneida Alar and she is in the office today. I will call her back when Dr. Oneida Alar addresses it.

## 2016-01-11 NOTE — Telephone Encounter (Addendum)
PLEASE CALL PT. SHE SHOULD SUBMIT STOOL FOR C DIFF PCR.IF IT IS NEGATIVE WE WILL TREAT HER WITH ENTOCORT AGAIN BUT FOR 12 WEEKS. SHE SHOULD USE KAOPECTATE TID TO SLOW DOWN HER STOOLS ONCE SHE SUBMITS A SAMPLE AND WE ARE WAITING ON RESULTS. SHE SHOULD NOT USE IMODIUM BECAUSE IT COULD MAKE THINGS WORSE AND EVEN RESULTS IN DEATH IF SHE HAS C DIFF.

## 2016-01-11 NOTE — Telephone Encounter (Signed)
Pt is aware. Lab order faxed to [facility] and pt will try to get by to get the container today and turn it in early tomorrow morning. I went over all of the directions with her and she seemed to understand.

## 2016-01-11 NOTE — Addendum Note (Signed)
Addended by: Danie Binder on: 01/11/2016 04:02 PM   Modules accepted: Orders

## 2016-01-14 LAB — CLOSTRIDIUM DIFFICILE BY PCR: Toxigenic C. Difficile by PCR: DETECTED — CR

## 2016-01-15 ENCOUNTER — Encounter: Payer: Self-pay | Admitting: Gastroenterology

## 2016-01-16 ENCOUNTER — Telehealth: Payer: Self-pay | Admitting: Gastroenterology

## 2016-01-16 ENCOUNTER — Telehealth: Payer: Self-pay

## 2016-01-16 DIAGNOSIS — A09 Infectious gastroenteritis and colitis, unspecified: Secondary | ICD-10-CM

## 2016-01-16 MED ORDER — SACCHAROMYCES BOULARDII 250 MG PO CAPS: ORAL_CAPSULE | ORAL | Status: DC

## 2016-01-16 MED ORDER — METRONIDAZOLE 500 MG PO TABS: ORAL_TABLET | ORAL | Status: DC

## 2016-01-16 NOTE — Telephone Encounter (Signed)
Pt called and left her fax number for [place] to fax her work note to. Dr. Oneida Alar, how long should I do the work note for?

## 2016-01-16 NOTE — Telephone Encounter (Signed)
Faxed note to pt at [phone].

## 2016-01-16 NOTE — Telephone Encounter (Signed)
See previous note dated 01/16/2016.

## 2016-01-16 NOTE — Telephone Encounter (Signed)
Out of work JAN 30-RETURN FEB 6. PT MAY WORK FROM HOME.

## 2016-01-16 NOTE — Telephone Encounter (Signed)
PLEASE CALL PT. PLEASE CALL PT. HER C DIFF TEST IS positive. She needs Flagyl 500 mg tid for 10 days. THE PRESCRIPTION HAS BEEN SENT TO [facility] IN [place].   FLAGYL MAY CAUSE NAUSEA, VOMITING, OR LEG WEAKNESS.  DO NOT DRINK ALCOHOL OR TAKE COUGH SYRUP WHILE TAKING FLAGYL. She is very contagious. She should not go to work until [place] her diarrhea resolves. She should call in 3 days if she is not better. SHE SHOULD NOT TAKE THE ENTOCORT AT THIS TIME.   SHE NEEDS FLORASTOR PROBIOTIC 1 BID FOR THE NEXT 3 MOS. She should take the following precautions:       1. Hand-Washing Hand-washing is the No. 1 preventative measure in stopping the spread and infection of C. Diff. It is important to wash hands with warm water and anti-bacterial soap for at least 30 seconds. Alcohol-based hand sanitizers are not effective against C. Diff and will not kill spores that may be on the hands.       2. Isolation Any person who is infected with C. Diff should remain in a separate bedroom and ideally have a separate bathroom for at least 48 hours after they have stopped having diarrhea during treatment. When you are around a person with this infection, it is important to wear gloves and, if possible, a yellow isolation gown to prevent picking up the spores from their environment. Wash your hands immediately upon leaving their room to kill any spores that you may have accidentally picked up.        3. Separate Hygiene Items The person infected with C. Diff should have their own hygiene items. Do not share washcloths, hand towels, toothbrushes, or linens with an infected person. Linens and clothes should be washed separately from anyone else's clothes with detergent and on the hot setting of the washer and dryer.       4. Daily Cleaning of Affected Areas It is important to clean any areas that the infected person comes into contact with daily. Bathrooms should be cleaned after every visit. Mix 1 part of bleach  with 10 parts of water as a cleaner and use on toilet bowls, sinks, sink and toilet handles and doorknobs. Vacuum carpets daily and mop any hard floors with the bleach solution used for bathroom cleansing to kill spores that are in the environment

## 2016-01-16 NOTE — Telephone Encounter (Signed)
Pt is aware and has received the fax.

## 2016-01-16 NOTE — Telephone Encounter (Signed)
Pt is aware of results and recommendations. She needs a note to be out of work starting today, and she needs it to say that she can work from home, just not be at the office. 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 She would like to pick up the note when she comes to town for the prescription.  How many days do I need to write it for?

## 2016-01-16 NOTE — Telephone Encounter (Signed)
T/C from [facility] [place] reporting a positive C-Diff on this pt. Dr. Oneida Alar is at the hospital and I am sending this to her.

## 2016-01-16 NOTE — Telephone Encounter (Signed)
[phone]  THIS IS THE PATIENT FAX NUMBER.  CAN YOU JUST FAX HER THE LETTER WHEN IT IS COMPLETE SO SHE DOES NOT HAVE TO COME BACK OUT

## 2016-01-19 ENCOUNTER — Encounter (HOSPITAL_COMMUNITY): Payer: Self-pay | Admitting: Psychiatry

## 2016-01-19 ENCOUNTER — Other Ambulatory Visit (HOSPITAL_COMMUNITY): Payer: Self-pay | Admitting: Psychiatry

## 2016-01-19 ENCOUNTER — Telehealth (HOSPITAL_COMMUNITY): Payer: Self-pay | Admitting: *Deleted

## 2016-01-19 ENCOUNTER — Other Ambulatory Visit: Payer: Self-pay

## 2016-01-19 ENCOUNTER — Ambulatory Visit (HOSPITAL_COMMUNITY): Payer: Self-pay | Admitting: Psychiatry

## 2016-01-19 MED ORDER — NIFEDIPINE ER OSMOTIC RELEASE 90 MG PO TB24: 90.0000 mg | ORAL_TABLET | Freq: Every day | ORAL | Status: DC

## 2016-01-19 MED ORDER — PAROXETINE HCL 20 MG PO TABS: 30.0000 mg | ORAL_TABLET | Freq: Every day | ORAL | Status: DC

## 2016-01-19 MED ORDER — ALPRAZOLAM 1 MG PO TABS: 1.0000 mg | ORAL_TABLET | Freq: Four times a day (QID) | ORAL | Status: DC

## 2016-01-19 MED ORDER — HYDROCHLOROTHIAZIDE 12.5 MG PO CAPS: ORAL_CAPSULE | ORAL | Status: DC

## 2016-01-19 NOTE — Telephone Encounter (Signed)
voice message from patient, she said one of her doctors has told her that she is highly contagious.   Can't come today.  She need refills.

## 2016-01-19 NOTE — Telephone Encounter (Signed)
Called pt to get more details about her previous call. Per pt, Dr. Oneida Alar told her she have C-Diff of the colon. Per pt, she is highly contagious. Per pt, she had to sanitize her entire house. Per pt, she had an appt to day to get her medications refill. Per pt she is out of refills and would like to see if Dr. Harrington Challenger could refill them for her. Pt f/u appt is scheduled for 02-08-16. Pt number is [phone].

## 2016-01-19 NOTE — Telephone Encounter (Signed)
Refill complete 

## 2016-01-19 NOTE — Telephone Encounter (Addendum)
Per Dr. Harrington Challenger to call in pt Xanax. Spoke with Helene Kelp and she got a Software engineer on the line and their name is [facility]. Verified pt information with him and verified medication that was being called in. Per Dominica Severin they will get pt medication ready for her to mail out and it takes about 7 days to get to her. Pt is aware that medication was called into pharmacy and pt agreed for office to send it to her mail order pharmacy instead of her local pharmacy.

## 2016-01-19 NOTE — Telephone Encounter (Signed)
i have sent in paxil, you will need to call in xanax

## 2016-01-19 NOTE — Telephone Encounter (Signed)
Pt called to say she is not much better. She is not having diarrhea through the night now, but she has had it 4 times today already and she is feeling very weak. She is taking the Flagyl, probiotic and Kaopectate. Please advise!

## 2016-01-19 NOTE — Telephone Encounter (Signed)
PLEASE CALL PT. SHE NEEDS TO CONTINUE FLAGYL. IF SHE FEELS WEAK, SHE NEEDS A BMP TO CHECK HER K. SHE SHOULD LET THE FRONT DESK STAFF KNOW SHE IS BEING TREATED FOR C DIFF. SHE SHOULD CONTINUE THE FLAGYL AND PROBIOTIC. IF HER DIARRHEA IS NOT RESOLVED BY MON, SHE SHOULD CALL.

## 2016-01-19 NOTE — Addendum Note (Signed)
Addended by: Danie Binder on: 01/19/2016 05:34 PM   Modules accepted: Orders

## 2016-01-20 ENCOUNTER — Other Ambulatory Visit: Payer: Self-pay

## 2016-01-20 DIAGNOSIS — A09 Infectious gastroenteritis and colitis, unspecified: Secondary | ICD-10-CM

## 2016-01-20 NOTE — Progress Notes (Signed)
Previous order placed.

## 2016-01-20 NOTE — Telephone Encounter (Signed)
I called and informed pt. She said she feels better today and not really weak, she doesn't feel that she needs to have the blood work. I told her I am faxing an order to [facility] since we leave at noon today so if she has starts to feel worse. She is aware to keep taking the Flagyl and Probiotic. She will call Mon if not improved.

## 2016-01-20 NOTE — Telephone Encounter (Signed)
Medication called in to pharmacy.

## 2016-01-20 NOTE — Telephone Encounter (Signed)
REVIEWED-NO ADDITIONAL RECOMMENDATIONS. 

## 2016-01-23 ENCOUNTER — Telehealth: Payer: Self-pay | Admitting: Cardiology

## 2016-01-23 ENCOUNTER — Other Ambulatory Visit: Payer: Self-pay

## 2016-01-23 ENCOUNTER — Telehealth (HOSPITAL_COMMUNITY): Payer: Self-pay | Admitting: *Deleted

## 2016-01-23 MED ORDER — METOPROLOL SUCCINATE ER 50 MG PO TB24: 50.0000 mg | ORAL_TABLET | Freq: Two times a day (BID) | ORAL | Status: DC

## 2016-01-23 MED ORDER — PANTOPRAZOLE SODIUM 40 MG PO TBEC: 40.0000 mg | DELAYED_RELEASE_TABLET | Freq: Every day | ORAL | Status: DC

## 2016-01-23 MED ORDER — NIFEDIPINE ER OSMOTIC RELEASE 90 MG PO TB24: 90.0000 mg | ORAL_TABLET | Freq: Every day | ORAL | Status: DC

## 2016-01-23 NOTE — Telephone Encounter (Signed)
Refilled toprol

## 2016-01-23 NOTE — Telephone Encounter (Signed)
Await labs. Please discuss with Dr. Oneida Alar tomorrow. May need change in antibiotic.

## 2016-01-23 NOTE — Telephone Encounter (Signed)
Pt was wondering why her protonix and her nifedipine was denied by pharmacy. I let her know we cannot answer that unless [facility] sends [place] something asking for more information/prior auth..etc. She stated she will call and ask what the problem is.

## 2016-01-23 NOTE — Telephone Encounter (Signed)
Pt called wanting to speak with the nurses on hows she has been doing since her last visit w/ Dr. Harl Bowie

## 2016-01-23 NOTE — Telephone Encounter (Signed)
Pt states that her ins has denied her Procardia and Protonix, she uses [facility] mail order, she would like to know what she needs to do

## 2016-01-23 NOTE — Telephone Encounter (Signed)
Pt called and said she had seemed to be doing better until yesterday she had 7 very loose stools ( not watery). Today since 4 AM she has had 3 loose stools and the last very watery. She is having slight headaches with it and everything goes straight through. She did not go to the lab Friday or Sat because she did feel better at that time, but she will go this AM. She is aware that Dr. Oneida Alar is off today and I will send this message to Neil Crouch, PA to advise in Dr. Oneida Alar absence.

## 2016-01-23 NOTE — Telephone Encounter (Signed)
Pt pharmacy [facility] faxed refill request for pt Nifedipine E 90 mg EX, Paroxetine H 20 mg TA, Simvastatin 40 mg TA, Hydrochlorot 12.5 mg CA and Pantoprazole 40 mg TE. Spoke with Altha Harm and informed her that Dr. Harrington Challenger does not fill pt Nifedipine E 90 mg EX and she stated she will get that one token care of. Also informed Altha Harm that Dr. Harrington Challenger does not fill pt Simvastatin, HCTZ and Pantoprazole and she stated that those medication was sent to the right doctor and it was filled. Then asked Altha Harm if they received script for pt Paroxetine H 20 mg TA due to office sending it to them on 01-19-16 and fax being dated for 01-22-16. Per Altha Harm, they did receive it and to disregard that fax.

## 2016-01-24 LAB — BASIC METABOLIC PANEL
BUN: 14 mg/dL (ref 7–25)
CO2: 30 mmol/L (ref 20–31)
Calcium: 9.4 mg/dL (ref 8.6–10.4)
Chloride: 102 mmol/L (ref 98–110)
Creat: 0.64 mg/dL (ref 0.50–1.05)
Glucose, Bld: 98 mg/dL (ref 65–99)
Potassium: 4.1 mmol/L (ref 3.5–5.3)
Sodium: 137 mmol/L (ref 135–146)

## 2016-01-24 NOTE — Telephone Encounter (Signed)
Lab results are in epic. Please advise!

## 2016-01-24 NOTE — Telephone Encounter (Signed)
PLEASE CALL PT. HER BMP IS NORMAL.

## 2016-01-25 ENCOUNTER — Ambulatory Visit (INDEPENDENT_AMBULATORY_CARE_PROVIDER_SITE_OTHER): Payer: Managed Care, Other (non HMO) | Admitting: Gastroenterology

## 2016-01-25 ENCOUNTER — Other Ambulatory Visit: Payer: Self-pay | Admitting: Cardiology

## 2016-01-25 ENCOUNTER — Encounter: Payer: Self-pay | Admitting: Gastroenterology

## 2016-01-25 VITALS — BP 96/67 | HR 92 | Temp 98.0°F | Ht 64.0 in | Wt 178.0 lb

## 2016-01-25 DIAGNOSIS — A047 Enterocolitis due to Clostridium difficile: Secondary | ICD-10-CM

## 2016-01-25 DIAGNOSIS — A0472 Enterocolitis due to Clostridium difficile, not specified as recurrent: Secondary | ICD-10-CM | POA: Insufficient documentation

## 2016-01-25 NOTE — Telephone Encounter (Signed)
Those medications were sent in yesterday and [facility] also spoke to them concerning these medications.

## 2016-01-25 NOTE — Telephone Encounter (Signed)
Called and LMOM that the labs were normal.

## 2016-01-25 NOTE — Telephone Encounter (Signed)
Patient needs 90 day supply RX sent to [facility] for Procardia and Protonix / tg

## 2016-01-25 NOTE — Progress Notes (Signed)
Subjective:    Patient ID: Rachel Sutton, female    DOB: Mar 07, 1961, 55 y.o.   MRN: [id number]  Alonza Bogus, MD  HPI TREATED FOR COLLAGENOUS COLITIS-ENTOCORT. STOOL WENT BACK TO NORMAL DURING WHOLE TREATMENTFOR 3 WEEKS AFTER SHE STOPPED SHE STARTED HAVING WATERY DIARRHEA:  TNTC.  C DIFF POS AND STARTED FLAGYL DIARRHEA BETTER, BUT STILL HAVING #7 1 HR AFTER DRINKING AND 2 HRS AFTER EATING. INITIALLY HAD NAUSEA BUT NOW BETTER. LAST DOSE OF FLAGYL IS TODAY. BMs: 4 TODAY(SML SQUIRTS). LAST WEEK NEEDED DEPENDS.   PT DENIES FEVER, CHILLS, HEMATOCHEZIA, HEMATEMESIS, vomiting, melena, CHEST PAIN, SHORTNESS OF BREATH,  CHANGE IN BOWEL IN HABITS, constipation, abdominal pain, problems swallowing, problems with sedation, OR heartburn or indigestion.  Past Medical History  Diagnosis Date  . Fuchs' corneal dystrophy   . Essential hypertension, benign   . Mixed hyperlipidemia   . Mitral valve prolapse     Mild mitral regurgitation  . GERD (gastroesophageal reflux disease)   . Arthritis   . Anxiety   . Breast cancer ([place]) 02/22/2012    Right T1c, N0  . Depression   . Seizures ([place])     only with dose of haldol; no more since that was stopped.  . Colitis, collagenous 09/22/2015   Past Surgical History  Procedure Laterality Date  . Carpal tunnel r/l  1992  . Ray cage fusion  1997  . Cervical spine fusion x2 since last visit    . Elbow surgery Right  tennis elbow release 2012  . Abdominal hysterectomy  1999  . Sentinel lymph node biopsy  10/30/2002    right  . Ovarian cyst removal      prior to hysterectomy, right  . Breast lumpectomy  10/30/2002    right  . Esophagogastroduodenoscopy  10/26/2012      . Left heart catheterization with coronary angiogram N/A 10/23/2012      . Knee arthroscopy Left   . Colonoscopy with propofol N/A 09/28/2015      . Esophagogastroduodenoscopy (egd) with propofol N/A 09/28/2015      . Biopsy N/A 09/28/2015       Allergies  Allergen Reactions  .  Bee Venom Anaphylaxis  . Haloperidol Lactate Other (See Comments)    Convulsions   . Meperidine Hcl Hives   Current Outpatient Prescriptions  Medication Sig Dispense Refill  . aspirin EC 81 MG EC tablet Take 1 tablet (81 mg total) by mouth daily.    . Biotin 5 MG TABS Take 1 tablet by mouth daily.    Marland Kitchen CALCIUM 500 PO Take 1 tablet by mouth 2 (two) times daily.    . EPI-PEN) 0.3 mg/0.3 mL DEVI Inject 0.3 mg into the muscle as needed. For bee venom    . fish oil-omega-3 fatty acids 1000 MG capsule Take 2 g by mouth every morning.    Marland Kitchen MICROZIDE 12.5 MG capsule TAKE 1 CAPSULE BY MOUTH EVERY DAY    . TOPROL-XL 50 MG 24 hr tablet Take 1 tablet (50 mg total) by mouth 2 (two) times daily.     . FLAGYL 500 MG tablet 1 PO  TID FOR 10 DAYS    . MULITIVITAMIN  Take 1 tablet by mouth every morning.    Marland Kitchen NIFEdipine  90 MG 24 hr tablet Take 1 tablet (90 mg total) by mouth daily.    . nitroGLYCERIN (NITROSTAT) 0.4 MG SL tablet Place 1 tablet (0.4 mg total) under the tongue every 5 (five) minutes x 3  doses as needed for chest pain.    . pantoprazole (PROTONIX) 40 MG tablet Take 1 tablet (40 mg total) by mouth daily.    Marland Kitchen PARoxetine (PAXIL) 20 MG tablet Take 1.5 tablets (30 mg total) by mouth daily.    Marland Kitchen FLORASTOR 250 MG capsule 1 PO BID FOR 3 MOS    . simvastatin (ZOCOR) 40 MG tablet Take 40 mg by mouth every morning.     Marland Kitchen MURO 128 5 % ophthalmic solution Place 1 drop into both eyes as needed for irritation.     . traMADol (ULTRAM) 50 MG tablet Take 50 mg by mouth every 6 (six) hours as needed for moderate pain.     .      .       Review of Systems PER HPI OTHERWISE ALL SYSTEMS ARE NEGATIVE.    Objective:   Physical Exam  Constitutional: She is oriented to person, place, and time. She appears well-developed and well-nourished. No distress.  NON-TOXIC  HENT:  Head: Normocephalic and atraumatic.  Mouth/Throat: Oropharynx is clear and moist. No oropharyngeal exudate.  Eyes: Pupils are equal,  round, and reactive to light. No scleral icterus.  Neck: Normal range of motion. Neck supple.  Cardiovascular: Normal rate, regular rhythm and normal heart sounds.   Pulmonary/Chest: Effort normal and breath sounds normal. No respiratory distress.  Abdominal: Soft. Bowel sounds are normal. She exhibits no distension. There is no tenderness.  Musculoskeletal: She exhibits no edema.  Lymphadenopathy:    She has no cervical adenopathy.  Neurological: She is alert and oriented to person, place, and time.  NO FOCAL DEFICITS  Psychiatric: She has a normal mood and affect.  Vitals reviewed.     Assessment & Plan:

## 2016-01-25 NOTE — Patient Instructions (Signed)
COMPLETE STOOL STUDY TODAY. IF NEGATIVE FOR C DIFF, YOU CAN RETURN TO WORK ON MON FEB 13 AND USE IMODIUM PRIOR TO MEALS UP TO THREE TIMES A DAY. IF POSITIVE FOR C DIFF YOU WILL NEED VANCOMYCIN AND USE KAOPECTATE AS NEEDED FOR DIARRHEA/LOOSE STOOLS, AND YOU CAN RETURN TO MON FEB 13.  CONTINUE FLORASTOR FOR 3 MOS.  DRINK WATER TO KEEP YOUR URINE LIGHT YELLOW.  AVOID DIARY FOR 4 WEEKS.  FOLLOW UP IN 3 MOS.     Lactose Free Diet Lactose is a carbohydrate that is found mainly in milk and milk products, as well as in foods with added milk or whey. Lactose must be digested by the enzyme in order to be used by the body. Lactose intolerance occurs when there is a shortage of lactase. When your body is not able to digest lactose, you may feel sick to your stomach (nausea), bloating, cramping, gas and diarrhea.  There are many dairy products that may be tolerated better than milk by some people:  The use of cultured dairy products such as yogurt, buttermilk, cottage cheese, and sweet acidophilus milk (Kefir) for lactase-deficient individuals is usually well tolerated. This is because the healthy bacteria help digest lactose.   Lactose-hydrolyzed milk (Lactaid) contains 40-90% less lactose than milk and may also be well tolerated.    SPECIAL NOTES  Lactose is a carbohydrates. The major food source is dairy products. Reading food labels is important. Many products contain lactose even when they are not made from milk. Look for the following words: whey, milk solids, dry milk solids, nonfat dry milk powder. Typical sources of lactose other than dairy products include breads, candies, cold cuts, prepared and processed foods, and commercial sauces and gravies.   All foods must be prepared without milk, cream, or other dairy foods.   Soy milk and lactose-free supplements (LACTASE) may be used as an alternative to milk.   FOOD GROUP ALLOWED/RECOMMENDED AVOID/USE SPARINGLY  BREADS / STARCHES 4  servings or more* Breads and rolls made without milk. [place], [place], or [place] bread. Breads and rolls that contain milk. Prepared mixes such as muffins, biscuits, waffles, pancakes. Sweet rolls, donuts, [place] toast (if made with milk or lactose).  Crackers: Soda crackers, graham crackers. Any crackers prepared without lactose. Zwieback crackers, corn curls, or any that contain lactose.  Cereals: Cooked or dry cereals prepared without lactose (read labels). Cooked or dry cereals prepared with lactose (read labels). Total, Cocoa Krispies. Special K.  Potatoes / Pasta / Rice: Any prepared without milk or lactose. Popcorn. Instant potatoes, frozen [place] fries, scalloped or au gratin potatoes.  VEGETABLES 2 servings or more Fresh, frozen, and canned vegetables. Creamed or breaded vegetables. Vegetables in a cheese sauce or with lactose-containing margarines.  FRUIT 2 servings or more All fresh, canned, or frozen fruits that are not processed with lactose. Any canned or frozen fruits processed with lactose.  MEAT & SUBSTITUTES 2 servings or more (4 to 6 oz. total per day) Plain beef, chicken, fish, [place], lamb, veal, pork, or ham. Kosher prepared meat products. Strained or junior meats that do not contain milk. Eggs, soy meat substitutes, nuts. Scrambled eggs, omelets, and souffles that contain milk. Creamed or breaded meat, fish, or fowl. Sausage products such as wieners, liver sausage, or cold cuts that contain milk solids. Cheese, cottage cheese, or cheese spreads.  MILK None. (See "BEVERAGES" for milk substitutes. See "DESSERTS" for ice cream and frozen desserts.) Milk (whole, 2%, skim, or chocolate). Evaporated, powdered, or  condensed milk; malted milk.  SOUPS & COMBINATION FOODS Bouillon, broth, vegetable soups, clear soups, consomms. Homemade soups made with allowed ingredients. Combination or prepared foods that do not contain milk or milk products (read labels). Cream soups, chowders,  commercially prepared soups containing lactose. Macaroni and cheese, pizza. Combination or prepared foods that contain milk or milk products.  DESSERTS & SWEETS In moderation Water and fruit ices; gelatin; angel food cake. Homemade cookies, pies, or cakes made from allowed ingredients. Pudding (if made with water or a milk substitute). Lactose-free tofu desserts. Sugar, honey, corn syrup, jam, jelly; marmalade; molasses (beet sugar); Pure sugar candy; marshmallows. Ice cream, ice milk, sherbet, custard, pudding, frozen yogurt. Commercial cake and cookie mixes. Desserts that contain chocolate. Pie crust made with milk-containing margarine; reduced-calorie desserts made with a sugar substitute that contains lactose. Toffee, peppermint, butterscotch, chocolate, caramels.  FATS & OILS In moderation Butter (as tolerated; contains very small amounts of lactose). Margarines and dressings that do not contain milk, Vegetable oils, shortening, Miracle Whip, mayonnaise, nondairy cream & whipped toppings without lactose or milk solids added (examples: Coffee Rich, Carnation Coffeemate, Rich's Whipped Topping, PolyRich). Berniece Salines. Margarines and salad dressings containing milk; cream, cream cheese; peanut butter with added milk solids, sour cream, chip dips, made with sour cream.  BEVERAGES Carbonated drinks; tea; coffee and freeze-dried coffee; some instant coffees (check labels). Fruit drinks; fruit and vegetable juice; Rice or Soy milk. Ovaltine, hot chocolate. Some cocoas; some instant coffees; instant iced teas; powdered fruit drinks (read labels).   CONDIMENTS / MISCELLANEOUS Soy sauce, carob powder, olives, gravy made with water, baker's cocoa, pickles, pure seasonings and spices, wine, pure monosodium glutamate, catsup, mustard. Some chewing gums, chocolate, some cocoas. Certain antibiotics and vitamin / mineral preparations. Spice blends if they contain milk products. MSG extender. Artificial sweeteners that  contain lactose such as Equal (Nutra-Sweet) and Sweet 'n Low. Some nondairy creamers (read labels).   SAMPLE MENU*  Breakfast   Orange Juice.  Banana.   Bran flakes.   Nondairy Creamer.  [place] Bread (toasted).   Butter or milk-free margarine.   Coffee or tea.    Noon Meal   Chicken Breast.  Rice.   Green beans.   Butter or milk-free margarine.  Fresh melon.   Coffee or tea.    Evening Meal   Roast Beef.  Baked potato.   Butter or milk-free margarine.   Broccoli.   Lettuce salad with vinegar and oil dressing.  [employer]   Coffee or tea.

## 2016-01-25 NOTE — Progress Notes (Signed)
cc'ed to pcp °

## 2016-01-25 NOTE — Progress Notes (Signed)
ON RECALL  °

## 2016-01-25 NOTE — Assessment & Plan Note (Signed)
SYMPTOMS NOT IDEALLY CONTROLLED BUT IMPROVED. MAY HAVE POST INFECTIOUS IBS, PARTIALLY TREATED C DIFF, DOUBT COLLAGENOUS COLITIS.  COMPLETE STOOL STUDY TODAY. IF NEGATIVE FOR C DIFF, YOU CAN RETURN TO WORK ON MON FEB 13 AND USE IMODIUM PRIOR TO MEALS UP TO THREE TIMES A DAY. IF POSITIVE FOR C DIFF YOU WILL NEED VANCOMYCIN AND USE KAOPECTATE AS NEEDED FOR DIARRHEA/LOOSE STOOLS, AND YOU CAN RETURN TO MON FEB 13. CONTINUE FLORASTOR FOR 3 MOS. DRINK WATER TO KEEP YOUR URINE LIGHT YELLOW. AVOID DIARY FOR 4 WEEKS.  HANDOUT GIVEN. FOLLOW UP IN 3 MOS.

## 2016-01-27 LAB — CLOSTRIDIUM DIFFICILE BY PCR: Toxigenic C. Difficile by PCR: NOT DETECTED

## 2016-01-30 NOTE — Progress Notes (Signed)
Quick Note:  Pt is aware. ______ 

## 2016-02-02 ENCOUNTER — Telehealth (HOSPITAL_COMMUNITY): Payer: Self-pay | Admitting: *Deleted

## 2016-02-02 NOTE — Telephone Encounter (Signed)
Called [facility] to get more information. Spoke with Valarie Merino at 3:32 on 02-02-16. Per Valarie Merino, they have in their system that medication was called in on 01-19-16 but was not sent to pt and could not see why. Valarie Merino then stated that he would have to check with his senior advisors. When Valarie Merino came back on the phone, he then stated, his senior advisor informed him that the same day that office called in pt Xanax was the same day pt called them to cancel it. Per Valarie Merino, pt informed them that she did not want them to ship out her medication anymore. Per Valarie Merino, senior advisor informed him that on Feb 8th, 2017, it look like pt got her Xanax with [facility]. Tried calling pt home number and cell number and lmtcb. Number provided. Waiting for pt to call office back

## 2016-02-02 NOTE — Telephone Encounter (Signed)
Xanax 90 day supply sent to [facility]  phone [phone], was lost, not in their system.    patient is asking you to re-send.

## 2016-02-02 NOTE — Telephone Encounter (Signed)
noted 

## 2016-02-03 ENCOUNTER — Telehealth: Payer: Self-pay | Admitting: Cardiology

## 2016-02-03 NOTE — Telephone Encounter (Signed)
Spoke with pt. Verified with her on previous message with staff and pt agreed. Informed pt with what [facility] had informed office and pt stated she did not fill with [facility]. Then pt stated well she filled for 1 wks worth with [facility]. Then pt stated that she also called [facility] and they informed her with all of the information that was told to this office. Per pt, [facility] told her that she will need a new script from [place] called in again to for them to refill it again before they send it to her. Per pt, that's why she was calling to see if Dr. Harrington Challenger could [facility]. Informed pt message will be sent to provider and pt agreed. Informed Dr. Harrington Challenger verbally and she stated to call [facility] and staff agreed.

## 2016-02-03 NOTE — Telephone Encounter (Signed)
pls call conerning pt's Metoprolol due to ins not wanting to pay

## 2016-02-03 NOTE — Telephone Encounter (Signed)
lmtcb number provided 

## 2016-02-07 ENCOUNTER — Ambulatory Visit (INDEPENDENT_AMBULATORY_CARE_PROVIDER_SITE_OTHER): Payer: Managed Care, Other (non HMO) | Admitting: Psychiatry

## 2016-02-07 ENCOUNTER — Encounter (HOSPITAL_COMMUNITY): Payer: Self-pay | Admitting: Psychiatry

## 2016-02-07 VITALS — BP 103/66 | HR 65 | Ht 64.0 in | Wt 174.6 lb

## 2016-02-07 DIAGNOSIS — F322 Major depressive disorder, single episode, severe without psychotic features: Secondary | ICD-10-CM | POA: Diagnosis not present

## 2016-02-07 MED ORDER — ALPRAZOLAM 1 MG PO TABS: 1.0000 mg | ORAL_TABLET | Freq: Four times a day (QID) | ORAL | Status: DC

## 2016-02-07 MED ORDER — PAROXETINE HCL 20 MG PO TABS: 30.0000 mg | ORAL_TABLET | Freq: Every day | ORAL | Status: DC
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
## 2016-03-01 ENCOUNTER — Ambulatory Visit (INDEPENDENT_AMBULATORY_CARE_PROVIDER_SITE_OTHER): Payer: Managed Care, Other (non HMO) | Admitting: Cardiology

## 2016-03-01 ENCOUNTER — Encounter: Payer: Self-pay | Admitting: Cardiology

## 2016-03-01 VITALS — BP 114/86 | HR 74 | Ht 64.0 in | Wt 174.0 lb

## 2016-03-01 DIAGNOSIS — I341 Nonrheumatic mitral (valve) prolapse: Secondary | ICD-10-CM | POA: Diagnosis not present

## 2016-03-01 DIAGNOSIS — E785 Hyperlipidemia, unspecified: Secondary | ICD-10-CM

## 2016-03-01 DIAGNOSIS — I10 Essential (primary) hypertension: Secondary | ICD-10-CM | POA: Diagnosis not present

## 2016-03-01 NOTE — Progress Notes (Signed)
Cardiology Office Note  Date: 03/01/2016   ID: ADVAITA MONTET, DOB January 12, 1961, MRN [id number]  PCP: Alonza Bogus, MD  Primary Cardiologist: Rozann Lesches, MD   Chief Complaint  Patient presents with  . Mitral valve disease    History of Present Illness: Rachel Sutton is a 55 y.o. female last seen in September 2016. She presents for a routine follow-up visit. From a cardiac perspective, she does not report any progressing palpitations, states that she is sleeping better without feeling palpitations at nighttime. She continues on beta blocker therapy. She does state that she has had trouble with recurrent colitis in the meanwhile but that this is starting to settle down. She was out of work for several weeks.  She does report intermittent chest pain, nonexertional, atypical and prolonged. This is not associated with palpitations or syncope. Follow-up ECG today shows sinus rhythm with borderline low voltage, decreased R wave progression. No acute ST segment changes.  I reviewed her medications, she continues on aspirin, HCTZ, Toprol-XL, Zocor and nifedipine. Echocardiogram from last year was also reviewed.  Past Medical History  Diagnosis Date  . Fuchs' corneal dystrophy   . Essential hypertension, benign   . Mixed hyperlipidemia   . Mitral valve prolapse     Mild mitral regurgitation  . GERD (gastroesophageal reflux disease)   . Arthritis   . Anxiety   . Breast cancer ([place]) 02/22/2012    Right T1c, N0  . Depression   . Seizures ([place])     Only with dose of haldol; no more since that was stopped.  . Colitis, collagenous 09/22/2015  . C. difficile colitis     Current Outpatient Prescriptions  Medication Sig Dispense Refill  . ALPRAZolam (XANAX) 1 MG tablet Take 1 tablet (1 mg total) by mouth 4 (four) times daily. 120 tablet 2  . aspirin EC 81 MG EC tablet Take 1 tablet (81 mg total) by mouth daily.    . Biotin 5 MG TABS Take 1 tablet by mouth daily.    . budesonide  (ENTOCORT EC) 3 MG 24 hr capsule Take 3 capsules (9 mg total) by mouth daily. 30 capsule 0  . Calcium-Magnesium-Vitamin D (CALCIUM 500 PO) Take 1 tablet by mouth 2 (two) times daily.    . diclofenac (VOLTAREN) 75 MG EC tablet     . EPINEPHrine (EPI-PEN) 0.3 mg/0.3 mL DEVI Inject 0.3 mg into the muscle as needed. For bee venom    . fish oil-omega-3 fatty acids 1000 MG capsule Take 2 g by mouth every morning.    . hydrochlorothiazide (MICROZIDE) 12.5 MG capsule TAKE 1 CAPSULE BY MOUTH EVERY DAY 90 capsule 3  . metoprolol succinate (TOPROL-XL) 50 MG 24 hr tablet Take 1 tablet (50 mg total) by mouth 2 (two) times daily. Take with or immediately following a meal. 180 tablet 3  . Multiple Vitamin (MULITIVITAMIN WITH MINERALS) TABS Take 1 tablet by mouth every morning.    Marland Kitchen NIFEdipine (PROCARDIA XL/ADALAT-CC) 90 MG 24 hr tablet Take 1 tablet (90 mg total) by mouth daily. 90 tablet 3  . nitroGLYCERIN (NITROSTAT) 0.4 MG SL tablet Place 1 tablet (0.4 mg total) under the tongue every 5 (five) minutes x 3 doses as needed for chest pain. 25 tablet 3  . pantoprazole (PROTONIX) 40 MG tablet Take 1 tablet (40 mg total) by mouth daily. 90 tablet 3  . PARoxetine (PAXIL) 20 MG tablet Take 1.5 tablets (30 mg total) by mouth daily. 135 tablet 2  .  saccharomyces boulardii (FLORASTOR) 250 MG capsule 1 PO BID FOR 3 MOS 60 capsule 2  . simvastatin (ZOCOR) 40 MG tablet Take 40 mg by mouth every morning.     . sodium chloride (MURO 128) 5 % ophthalmic solution Place 1 drop into both eyes as needed for irritation.     . traMADol (ULTRAM) 50 MG tablet Take 50 mg by mouth every 6 (six) hours as needed for moderate pain.      No current facility-administered medications for this visit.   Allergies:  Bee venom; Haloperidol lactate; and Meperidine hcl   Social History: The patient  reports that she has been smoking Cigarettes.  She started smoking about 41 years ago. She has a 17.5 pack-year smoking history. She has never used  smokeless tobacco. She reports that she drinks alcohol. She reports that she does not use illicit drugs.   ROS:  Please see the history of present illness. Otherwise, complete review of systems is positive for interval problems with colitis, improving.  All other systems are reviewed and negative.   Physical Exam: VS:  BP 114/86 mmHg  Pulse 74  Ht 5\' 4"  (1.626 m)  Wt 174 lb (78.926 kg)  BMI 29.85 kg/m2  SpO2 93%, BMI Body mass index is 29.85 kg/(m^2).  Wt Readings from Last 3 Encounters:  03/01/16 174 lb (78.926 kg)  02/07/16 174 lb 9.6 oz (79.198 kg)  01/25/16 178 lb (80.74 kg)    Appears comfortable at rest. HEENT: Conjunctiva and lids normal, oropharynx clear  Neck: Supple, no elevated JVP or carotid bruits, no thyromegaly.  Lungs: Clear to auscultation, nonlabored breathing at rest.  Cardiac: Regular rate and rhythm, no S3 or significant systolic murmur, no pericardial rub.  Abdomen: Soft, nontender, bowel sounds present.  Extremities: No pitting edema, distal pulses 2+.   ECG: I personally reviewed the prior tracing from 09/26/2015 which showed sinus rhythm with borderline low voltage.  Recent Labwork: 09/17/2015: ALT 48; AST 48* 09/22/2015: TSH 0.706 09/26/2015: Hemoglobin 13.9; Platelets 353 01/23/2016: BUN 14; Creat 0.64; Potassium 4.1; Sodium 137   Other Studies Reviewed Today:  Echocardiogram 02/03/2015: Study Conclusions  - Left ventricle: The cavity size was normal. Wall thickness was normal. Systolic function was normal. The estimated ejection fraction was in the range of 60% to 65%. Wall motion was normal; there were no regional wall motion abnormalities. There was an increased relative contribution of atrial contraction to ventricular filling. - Mitral valve: Mildly calcified annulus. Mildly thickened leaflets . Mild posterior leaflet prolapse. There was mild regurgitation. - Left atrium: The atrium was mildly dilated. Volume/bsa, S:  29.3 ml/m^2.  Assessment and Plan:  1. Mild mitral valve prolapse with mild mitral regurgitation. She states that palpitations have been better controlled, continues on beta blocker. No changes were made today. No indication for follow-up echocardiogram as yet.  2. Essential hypertension, blood pressure is adequately controlled today.  3. Hyperlipidemia on Zocor. She continues to follow with Dr. Luan Pulling.  Current medicines were reviewed with the patient today.   Orders Placed This Encounter  Procedures  . EKG 12-Lead    Disposition: FU with me in 6 months.   Signed, Satira Sark, MD, [facility] 03/01/2016 8:53 AM    [place] [facility] [address], [place], [place] [address] Phone: [phone]; Fax: [phone]

## 2016-03-01 NOTE — Patient Instructions (Signed)
Your physician wants you to follow-up in: 6 Months with Dr. McDowell.  You will receive a reminder letter in the mail two months in advance. If you don't receive a letter, please call our office to schedule the follow-up appointment.  Your physician recommends that you continue on your current medications as directed. Please refer to the Current Medication list given to you today.  If you need a refill on your cardiac medications before your next appointment, please call your pharmacy.  Thank you for choosing [facility]! '  

## 2016-03-13 ENCOUNTER — Encounter: Payer: Self-pay | Admitting: Gastroenterology

## 2016-03-19 ENCOUNTER — Telehealth: Payer: Self-pay | Admitting: Gastroenterology

## 2016-03-19 ENCOUNTER — Telehealth: Payer: Self-pay

## 2016-03-19 MED ORDER — RIFAXIMIN 550 MG PO TABS: 550.0000 mg | ORAL_TABLET | Freq: Three times a day (TID) | ORAL | Status: DC

## 2016-03-19 NOTE — Telephone Encounter (Signed)
Patient called in stating that she would like to switch to our office from [place]. She states that she would like to have a 2nd opinion regarding diarrhea. Former Dr. Deatra Ina patient. Printed [facility] records and placed on Dr. Woodward Ku desk for review.

## 2016-03-19 NOTE — Telephone Encounter (Signed)
I sent in Xifaxan TID for 2 weeks, which is a treatment for IBS-D.

## 2016-03-19 NOTE — Telephone Encounter (Signed)
Forwarding the note to Dr. Oneida Alar for [facility]

## 2016-03-19 NOTE — Telephone Encounter (Signed)
Pt called to say that she has been communicating with Dr. Oneida Alar via [place]. Dr. Oneida Alar recommended RX of Xifaxin to try. ( See note of 3/28/20170. Routing to the refill box and pt would like it sent to [facility] in [place]. Please also note the request to the other GI office for second opinion.

## 2016-03-20 ENCOUNTER — Encounter: Payer: Self-pay | Admitting: Gastroenterology

## 2016-03-20 MED ORDER — RIFAXIMIN 550 MG PO TABS: 550.0000 mg | ORAL_TABLET | Freq: Three times a day (TID) | ORAL | Status: DC

## 2016-03-20 NOTE — Telephone Encounter (Signed)
Pt is aware.  

## 2016-03-20 NOTE — Telephone Encounter (Signed)
Dr. Silverio Decamp reviewed records and has accepted patient. Ok to schedule [facility]. Spoke to patient and she would like office visit first. Appointment scheduled.

## 2016-03-20 NOTE — Addendum Note (Signed)
Addended by: Orvil Feil on: 03/20/2016 12:11 PM   Modules accepted: Orders

## 2016-03-20 NOTE — Telephone Encounter (Signed)
Rachel Sutton, please send the prescription to [facility] in [place]. PT to call the mail order and cancel. It would take her too long to get it from them. Thanks!

## 2016-03-20 NOTE — Telephone Encounter (Signed)
Completed.

## 2016-03-23 ENCOUNTER — Other Ambulatory Visit: Payer: Self-pay

## 2016-03-23 ENCOUNTER — Telehealth: Payer: Self-pay

## 2016-03-23 ENCOUNTER — Other Ambulatory Visit: Payer: Self-pay | Admitting: Gastroenterology

## 2016-03-23 DIAGNOSIS — R197 Diarrhea, unspecified: Secondary | ICD-10-CM

## 2016-03-23 MED ORDER — HYDROCORTISONE 2.5 % RE CREA: 1.0000 "application " | TOPICAL_CREAM | Freq: Two times a day (BID) | RECTAL | Status: DC

## 2016-03-23 NOTE — Telephone Encounter (Signed)
Pt called this morning, she is concerned she is having side effects of the xifaxan. She is c/o abd cramping, rectal bleeding, BRB with wiping, she is having an urgent urge to have a bm but when she tries, nothing comes out, +nausea, no vomiting, no fever, no SOB.  She just started xifaxan on Tuesday. Advised pt to stop medication until she hears back from [place].  Routing to AS in Dr.Fields absence

## 2016-03-23 NOTE — Telephone Encounter (Signed)
Pt is aware. Lab order done and faxed to the lab. Pt said she had just finished her abx about 1 week prior to doing the cdiff test.

## 2016-03-23 NOTE — Telephone Encounter (Signed)
Rectal bleeding likely due to internal hemorrhoids. I have sent in cream to take. May stop the Xifaxan. I am not convinced this was the culprit, but since she started having symptoms, just stop it for now.   Her last Cdiff PCR was in Feb 2017 and negative. WAS SHE ON ANTIBIOTICS WHEN THIS WAS DONE?  She is at risk for recurrence and I would have a low threshold for rechecking one more time, especially if she was on abx previously as this can produce a false negative. We can go ahead and send an order to the lab to have on file.   She has a history of microscopic colitis, so this could be the culprit as well, vs postinfectious IBS or recurrent Cdiff.

## 2016-03-26 ENCOUNTER — Encounter: Payer: Self-pay | Admitting: Gastroenterology

## 2016-03-26 NOTE — Telephone Encounter (Signed)
PT came by the office today and said she has just taken the stool specimen by the lab or the C-Diff. She said she did clear liquids for a couple of days and the diarrhea improved, from about 16 times a day to about 8 times. As soon as she started back on regular food it increased again.  I told her to stay on the clear liquids until we get the results of her C-Diff. She also wants to know since it is possible she might have C-Diff does she need to stay home til she finds out the results. If so, she will need a note for work. Her home number is her fax and we could fax her the note and she could fax it to work. She was supposed to go to work today. Sending to Laban Emperor, NP to address the work note. Sending to Dr. Oneida Alar who will be back at the hospital tomorrow.   [place]: Dr. Oneida Alar, pt has made an appt at [facility] for a second opinion but she said that appt is in June. She said she is willing to go to [facility] if that is what you prefer. Please advise!

## 2016-03-26 NOTE — Telephone Encounter (Signed)
I have completed a work note for today.

## 2016-03-26 NOTE — Telephone Encounter (Signed)
Per Vicente Males, pt does not need to be out of work until we get results of the C-Diff. Ok to have a note just for today. Pt is aware. Letter faxed to her per her request.

## 2016-03-27 LAB — CLOSTRIDIUM DIFFICILE BY PCR: Toxigenic C. Difficile by PCR: NOT DETECTED

## 2016-03-27 NOTE — Telephone Encounter (Signed)
REVIEWED-NO ADDITIONAL RECOMMENDATIONS. 

## 2016-03-27 NOTE — Telephone Encounter (Signed)
Pt called to give update. Said she went back on the clear liquids, but is having a lot of diarrhea. Said she got up to go to work this morning, but when she stands the diarrhea is pouring out like water. Said she had to wear depends last night and she was having it every hour and messed the bed up. She is aware that Dr. Oneida Alar is back at the hospital today doing procedures, but I will let her know.

## 2016-03-28 ENCOUNTER — Encounter: Payer: Self-pay | Admitting: Gastroenterology

## 2016-03-28 ENCOUNTER — Telehealth: Payer: Self-pay

## 2016-03-28 NOTE — Telephone Encounter (Signed)
PLEASE CALL PT. HER C DIFF TEST IS NEGATIVE.  

## 2016-03-28 NOTE — Telephone Encounter (Signed)
SCHEDULED AND SHE IS AWARE OF DATE AND TIME

## 2016-03-28 NOTE — Telephone Encounter (Signed)
Pt called about stool sample results. Please advise

## 2016-03-28 NOTE — Telephone Encounter (Signed)
Pt needs OPV APR 13 at 1130, DX: DIARRHEA.

## 2016-03-28 NOTE — Telephone Encounter (Signed)
LMOM that the test was negative and call if she has questions.

## 2016-03-29 ENCOUNTER — Encounter: Payer: Self-pay | Admitting: Gastroenterology

## 2016-03-29 ENCOUNTER — Ambulatory Visit (INDEPENDENT_AMBULATORY_CARE_PROVIDER_SITE_OTHER): Payer: Managed Care, Other (non HMO) | Admitting: Gastroenterology

## 2016-03-29 ENCOUNTER — Other Ambulatory Visit: Payer: Self-pay

## 2016-03-29 VITALS — BP 95/60 | HR 71 | Temp 98.3°F | Ht 64.0 in | Wt 170.0 lb

## 2016-03-29 DIAGNOSIS — A047 Enterocolitis due to Clostridium difficile: Secondary | ICD-10-CM

## 2016-03-29 DIAGNOSIS — K529 Noninfective gastroenteritis and colitis, unspecified: Secondary | ICD-10-CM

## 2016-03-29 DIAGNOSIS — A0472 Enterocolitis due to Clostridium difficile, not specified as recurrent: Secondary | ICD-10-CM

## 2016-03-29 NOTE — Progress Notes (Signed)
Subjective:    Patient ID: Rachel Sutton, female    DOB: 20-Jan-1961, 55 y.o.   MRN: [id number]  HAWKINS,EDWARD L, MD   HPI Having BMs if she eats drinks anything(LGE QUANTITY, BWN, YLW, GRN). C DIFF PCR NEGATIVE. SUBJECTIVE CHILLS THIS WEEKEND. NAUSEA: MILD, COMES AND GOES. HAS CRAMPING AS WELL. HAD RECTAL BLEEDING SMALL AMOUNT AFTER STRAINING. XIFAXAN: URGE TO HAVE BM BUT NOTHING CAME OUT, CHILLS, RECTAL BLEEDING, NAUSEA. ABDOMINAL ONLY HAPPENS BEFORE BMS AND GETS BETTER AFTER BMs. TRIED IMODIUM AND PEPTO BISMOL TO TRY TO SLOW DOWN STOOLS. NO TRAVEL, ABX, OR FRESH WATER, NO SICK CONTACTS. NO SORES IN MOUTH, RASH ON LEGS, JOINT PAIN, OR BACK PAIN. BUTT HURTS BUT ANUSOL MADE IT BETTER.   PT DENIES FEVER, CHILLS, HEMATOCHEZIA, Vomiting, CHEST PAIN, SHORTNESS OF BREATH, CHANGE IN BOWEL IN HABITS, problems swallowing, problems with sedation, OR heartburn or indigestion.  Past Medical History  Diagnosis Date  . Fuchs' corneal dystrophy   . Essential hypertension, benign   . Mixed hyperlipidemia   . Mitral valve prolapse     Mild mitral regurgitation  . GERD (gastroesophageal reflux disease)   . Arthritis   . Anxiety   . Breast cancer ([place]) 02/22/2012    Right T1c, N0  . Depression   . Seizures ([place])     Only with dose of haldol; no more since that was stopped.  . Colitis, collagenous 09/22/2015  . C. difficile colitis    Past Surgical History  Procedure Laterality Date  . Carpal tunnel r/l  1992  . Ray cage fusion  1997  . Cervical spine fusion x2 since last visit    . Elbow surgery Right  tennis elbow release 2012  . Abdominal hysterectomy  1999  . Sentinel lymph node biopsy  10/30/2002    right  . Ovarian cyst removal      prior to hysterectomy, right  . Breast lumpectomy  10/30/2002    right  . Esophagogastroduodenoscopy  10/26/2012    Procedure: ESOPHAGOGASTRODUODENOSCOPY (EGD);  Surgeon: Inda Castle, MD;  Location: [facility];  Service: Endoscopy;  Laterality: N/A;    . Left heart catheterization with coronary angiogram N/A 10/23/2012      . Knee arthroscopy Left   . Colonoscopy with propofol N/A 09/28/2015      . Esophagogastroduodenoscopy (egd) with propofol N/A 09/28/2015      . Biopsy N/A 09/28/2015       Current Outpatient Prescriptions  Medication Sig Dispense Refill  . ALPRAZolam (XANAX) 1 MG tablet Take 1 tablet (1 mg total) by mouth 4 (four) times daily.    Marland Kitchen aspirin EC 81 MG EC tablet Take 1 tablet (81 mg total) by mouth daily.    . Biotin 5 MG TABS Take 1 tablet by mouth daily.    .      . CALCIUM 500 PO) Take 1 tablet by mouth 2 (two) times daily.    . diclofenac (VOLTAREN) 75 MG EC tablet     . EPINEPHrine (EPI-PEN) 0.3 mg/0.3 mL DEVI Inject 0.3 mg into the muscle as needed. For bee venom    . fish oil-omega-3 fatty acids 1000 MG capsule Take 2 g by mouth every morning.    . hydrochlorothiazide (MICROZIDE) 12.5 MG capsule TAKE 1 CAPSULE BY MOUTH EVERY DAY    . hydrocortisone (ANUSOL-HC) 2.5 % rectal cream Place 1 application rectally 2 (two) times daily.    . metoprolol succinate (TOPROL-XL) 50 MG 24 hr tablet Take 1 tablet (  50 mg total) by mouthBID    . Multiple Vitamin (MULITIVITAMIN WITH MINERALS) TABS Take 1 tablet by mouth every morning.     NIFEDIPINE Take 1 tablet (90 mg total) by mouth daily.    . nitroGLYCERIN (NITROSTAT) 0.4 MG SL tablet Place 1 tablet Q5 mins x 3 doses as needed for chest pain.    . pantoprazole (PROTONIX) 40 MG tablet Take 1 tablet (40 mg total) by mouth daily.    Marland Kitchen PARoxetine (PAXIL) 20 MG tablet Take 1.5 tablets (30 mg total) by mouth daily.    Marland Kitchen FLORASTOR) 250 MG capsule 1 PO BID FOR 3 MOS    . simvastatin (ZOCOR) 40 MG tablet Take 40 mg by mouth every morning.     . sodium chloride (MURO 128) 5 % ophthalmic solution Place 1 drop into both eyes as needed for irritation.     . traMADol (ULTRAM) 50 MG tablet Take 50 mg by [facility] as needed for moderate pain.     .       Review of Systems PER HPI  OTHERWISE ALL SYSTEMS ARE NEGATIVE.    Objective:   Physical Exam  Constitutional: She is oriented to person, place, and time. She appears well-developed and well-nourished. No distress.  HENT:  Head: Normocephalic and atraumatic.  Mouth/Throat: Oropharynx is clear and moist. No oropharyngeal exudate.  Eyes: Pupils are equal, round, and reactive to light. No scleral icterus.  Neck: Normal range of motion. Neck supple.  Cardiovascular: Normal rate, regular rhythm and normal heart sounds.   Pulmonary/Chest: Effort normal and breath sounds normal. No respiratory distress.  Abdominal: Soft. Bowel sounds are normal. She exhibits no distension. There is no tenderness.  Musculoskeletal: She exhibits no edema.  Lymphadenopathy:    She has no cervical adenopathy.  Neurological: She is alert and oriented to person, place, and time.  NO FOCAL DEFICITS  Psychiatric: She has a normal mood and affect.  Vitals reviewed.     Assessment & Plan:

## 2016-03-29 NOTE — Progress Notes (Signed)
ON RECALL  °

## 2016-03-29 NOTE — Progress Notes (Signed)
cc'ed to pcp °

## 2016-03-29 NOTE — Assessment & Plan Note (Signed)
C DIFF PCR NEG APR 2017

## 2016-03-29 NOTE — Patient Instructions (Addendum)
I AM REFERRING YOU TO [facility].  DRINK WATER TO KEEP YOUR URINE LIGHT YELLOW.  HOLD HYDROCHLORTHIAZIDE.  COLONOSCOPY NEXT THUR OR FRI WITH MAC.  ON THE DAY PRIOR TO YOUR COLONOSCOPY-TAKE MIRALAX 17 GMS PO Q1H FROM 12N TO 3-6PM. DRINK 1 CUP OF LIQUID 30 MINUTES AFTER EACH DOSE: 1230P TO 330-630PM. YOU CAN STOP PREP AFTER 3 HOURS IF YOU ARE CLEAR.  FLEETS WITH TAP WATER ENEMA IN PREOP.  TRY VIBERZI. TAKE ONE TABLET DAILY OR EVERY OTHER DAY. HOLD FOR IF YOU DON'T HAVE A BM IN A 24 HR PERIOD OF TIME. USE ONLY IMODIUM 1 OR 2 TIMES A DAY IF NEEDED TO CONTROL LOOSE STOOLS.  YOU MUST CALL IF YOU GO MORE THAN 4 DAYS WITHOUT A BOWEL MOVEMENT OR YOU DEVELOP INCREASE IN NAUSEA, VOMITING, OR ABDOMINAL PAIN.  FOLLOW UP IN 2 MOS.

## 2016-03-29 NOTE — Assessment & Plan Note (Addendum)
SYMPTOMS NOT CONTROLLED 7 ASSOCIATED WITH WEIGHT LOSS/ABDOMINALPAIN. PT HAS PRIOR PMHx: COLLAGENOUS COLITIS & C DIFF COLITIS. DIFFERENTIAL DIAGNOSIS INCLUDES: POSTINFECTIOUS IBS-D, REFRACTORY MICROSCOPIC COLITIS. LESS LIKELY OR IBD.   REFER TO [facility]. DRINK WATER TO KEEP YOUR URINE LIGHT YELLOW. HOLD HYDROCHLORTHIAZIDE.  USE VIBERZI. TAKE ONE TABLET DAILY OR EVERY OTHER DAY. HOLD FOR IF YOU DON'T HAVE A BM IN A 24 HR PERIOD OF TIME. USE ONLY IMODIUM 1 OR 2 TIMES A DAY IF NEEDED TO CONTROL LOOSE STOOLS. PT SHOULD CALL IF SHE GOES MORE THAN 4 DAYS WITHOUT A BOWEL MOVEMENT OR DEVELOPS INCREASE IN NAUSEA, VOMITING, OR ABDOMINAL PAIN.MEDICATION WARNINGS DISCUSSED TO INCLUDE PANCREATITIS.  COLONOSCOPY NEXT THUR OR FRI WITH [facility].ON THE DAY PRIOR TO YOUR COLONOSCOPY-TAKE MIRALAX 17 GMS PO Q1H FROM 12N TO 3-6PM. DRINK 1 CUP OF LIQUID 30 MINUTES AFTER EACH DOSE: 1230P TO 330-630PM. YOU CAN STOP PREP AFTER 3 HOURS IF YOU ARE CLEAR. FLEETS WITH TAP WATER ENEMA IN PREOP ON DAY OF TCS.  FOLLOW UP IN 2 MOS.

## 2016-04-02 ENCOUNTER — Other Ambulatory Visit: Payer: Self-pay

## 2016-04-02 ENCOUNTER — Encounter: Payer: Self-pay | Admitting: Gastroenterology

## 2016-04-02 DIAGNOSIS — K529 Noninfective gastroenteritis and colitis, unspecified: Secondary | ICD-10-CM

## 2016-04-02 NOTE — Patient Instructions (Signed)
Rachel Sutton  04/02/2016     @PREFPERIOPPHARMACY @   Your procedure is scheduled on 04/05/2016.  Report to [facility] at 10:45 A.M.  Call this number if you have problems the morning of surgery:  [phone]   Remember:  Do not eat food or drink liquids after midnight.  Take these medicines the morning of surgery with A SIP OF WATER Xanax, Entocort, Voltaren, Toprol, Procardia, Protonix, Paxil, Ultram if needed   Do not wear jewelry, make-up or nail polish.  Do not wear lotions, powders, or perfumes.  You may wear deodorant.  Do not shave 48 hours prior to surgery.  Men may shave face and neck.  Do not bring valuables to the hospital.  [facility] is not responsible for any belongings or valuables.  Contacts, dentures or bridgework may not be worn into surgery.  Leave your suitcase in the car.  After surgery it may be brought to your room.  For patients admitted to the hospital, discharge time will be determined by your treatment team.  Patients discharged the day of surgery will not be allowed to drive home.    Please read over the following fact sheets that you were given. Anesthesia Post-op Instructions     PATIENT INSTRUCTIONS POST-ANESTHESIA  IMMEDIATELY FOLLOWING SURGERY:  Do not drive or operate machinery for the first twenty four hours after surgery.  Do not make any important decisions for twenty four hours after surgery or while taking narcotic pain medications or sedatives.  If you develop intractable nausea and vomiting or a severe headache please notify your doctor immediately.  FOLLOW-UP:  Please make an appointment with your surgeon as instructed. You do not need to follow up with anesthesia unless specifically instructed to do so.  WOUND CARE INSTRUCTIONS (if applicable):  Keep a dry clean dressing on the anesthesia/puncture wound site if there is drainage.  Once the wound has quit draining you may leave it open to air.  Generally you should leave the  bandage intact for twenty four hours unless there is drainage.  If the epidural site drains for more than 36-48 hours please call the anesthesia department.  QUESTIONS?:  Please feel free to call your physician or the hospital operator if you have any questions, and they will be happy to assist you.      Colonoscopy A colonoscopy is an exam to look at the entire large intestine (colon). This exam can help find problems such as tumors, polyps, inflammation, and areas of bleeding. The exam takes about 1 hour.  LET [facility] CARE PROVIDER KNOW ABOUT:   Any allergies you have.  All medicines you are taking, including vitamins, herbs, eye drops, creams, and over-the-counter medicines.  Previous problems you or members of your family have had with the use of anesthetics.  Any blood disorders you have.  Previous surgeries you have had.  Medical conditions you have. RISKS AND COMPLICATIONS  Generally, this is a safe procedure. However, as with any procedure, complications can occur. Possible complications include:  Bleeding.  Tearing or rupture of the colon wall.  Reaction to medicines given during the exam.  Infection (rare). BEFORE THE PROCEDURE   Ask your health care provider about changing or stopping your regular medicines.  You may be prescribed an oral bowel prep. This involves drinking a large amount of medicated liquid, starting the day before your procedure. The liquid will cause you to have multiple loose stools until your stool is almost clear or light  green. This cleans out your colon in preparation for the procedure.  Do not eat or drink anything else once you have started the bowel prep, unless your health care provider tells you it is safe to do so.  Arrange for someone to drive you home after the procedure. PROCEDURE   You will be given medicine to help you relax (sedative).  You will lie on your side with your knees bent.  A long, flexible tube with a light  and camera on the end (colonoscope) will be inserted through the rectum and into the colon. The camera sends video back to a computer screen as it moves through the colon. The colonoscope also releases carbon dioxide gas to inflate the colon. This helps your health care provider see the area better.  During the exam, your health care provider may take a small tissue sample (biopsy) to be examined under a microscope if any abnormalities are found.  The exam is finished when the entire colon has been viewed. AFTER THE PROCEDURE   Do not drive for 24 hours after the exam.  You may have a small amount of blood in your stool.  You may pass moderate amounts of gas and have mild abdominal cramping or bloating. This is caused by the gas used to inflate your colon during the exam.  Ask when your test results will be ready and how you will get your results. Make sure you get your test results.   This information is not intended to replace advice given to you by your health care provider. Make sure you discuss any questions you have with your health care provider.   Document Released: 11/30/2000 Document Revised: 09/23/2013 Document Reviewed: 08/10/2013 Elsevier Interactive Patient Education [employer].

## 2016-04-03 ENCOUNTER — Encounter (HOSPITAL_COMMUNITY): Payer: Self-pay

## 2016-04-03 ENCOUNTER — Encounter (HOSPITAL_COMMUNITY)
Admission: RE | Admit: 2016-04-03 | Discharge: 2016-04-03 | Disposition: A | Discharge status: Home / Self Care | Payer: Managed Care, Other (non HMO) | Source: Ambulatory Visit | Attending: Gastroenterology | Admitting: Gastroenterology

## 2016-04-03 DIAGNOSIS — F329 Major depressive disorder, single episode, unspecified: Secondary | ICD-10-CM | POA: Diagnosis not present

## 2016-04-03 DIAGNOSIS — Z8619 Personal history of other infectious and parasitic diseases: Secondary | ICD-10-CM | POA: Diagnosis not present

## 2016-04-03 DIAGNOSIS — K219 Gastro-esophageal reflux disease without esophagitis: Secondary | ICD-10-CM | POA: Diagnosis not present

## 2016-04-03 DIAGNOSIS — Z01812 Encounter for preprocedural laboratory examination: Secondary | ICD-10-CM | POA: Diagnosis not present

## 2016-04-03 DIAGNOSIS — K625 Hemorrhage of anus and rectum: Secondary | ICD-10-CM | POA: Diagnosis not present

## 2016-04-03 DIAGNOSIS — Z853 Personal history of malignant neoplasm of breast: Secondary | ICD-10-CM | POA: Diagnosis not present

## 2016-04-03 DIAGNOSIS — I10 Essential (primary) hypertension: Secondary | ICD-10-CM | POA: Diagnosis not present

## 2016-04-03 DIAGNOSIS — K648 Other hemorrhoids: Secondary | ICD-10-CM | POA: Diagnosis not present

## 2016-04-03 DIAGNOSIS — Z7982 Long term (current) use of aspirin: Secondary | ICD-10-CM | POA: Diagnosis not present

## 2016-04-03 DIAGNOSIS — F419 Anxiety disorder, unspecified: Secondary | ICD-10-CM | POA: Diagnosis not present

## 2016-04-03 DIAGNOSIS — Q438 Other specified congenital malformations of intestine: Secondary | ICD-10-CM | POA: Diagnosis not present

## 2016-04-03 DIAGNOSIS — F17200 Nicotine dependence, unspecified, uncomplicated: Secondary | ICD-10-CM | POA: Diagnosis not present

## 2016-04-03 DIAGNOSIS — K529 Noninfective gastroenteritis and colitis, unspecified: Secondary | ICD-10-CM | POA: Diagnosis not present

## 2016-04-03 DIAGNOSIS — Z79899 Other long term (current) drug therapy: Secondary | ICD-10-CM | POA: Diagnosis not present

## 2016-04-03 DIAGNOSIS — R197 Diarrhea, unspecified: Secondary | ICD-10-CM | POA: Diagnosis present

## 2016-04-03 DIAGNOSIS — M1991 Primary osteoarthritis, unspecified site: Secondary | ICD-10-CM | POA: Diagnosis not present

## 2016-04-03 DIAGNOSIS — R634 Abnormal weight loss: Secondary | ICD-10-CM | POA: Diagnosis not present

## 2016-04-03 DIAGNOSIS — E782 Mixed hyperlipidemia: Secondary | ICD-10-CM | POA: Diagnosis not present

## 2016-04-03 LAB — BASIC METABOLIC PANEL
Anion gap: 7 (ref 5–15)
BUN: 15 mg/dL (ref 6–20)
CO2: 25 mmol/L (ref 22–32)
Calcium: 8.4 mg/dL — ABNORMAL LOW (ref 8.9–10.3)
Chloride: 108 mmol/L (ref 101–111)
Creatinine, Ser: 0.7 mg/dL (ref 0.44–1.00)
GFR calc Af Amer: >60 mL/min (ref >60)
GFR calc non Af Amer: >60 mL/min (ref >60)
Glucose, Bld: 118 mg/dL — ABNORMAL HIGH (ref 65–99)
Potassium: 4.4 mmol/L (ref 3.5–5.1)
Sodium: 140 mmol/L (ref 135–145)

## 2016-04-03 LAB — CBC
HCT: 36.9 % (ref 36.0–46.0)
Hemoglobin: 12.6 g/dL (ref 12.0–15.0)
MCH: 29.7 pg (ref 26.0–34.0)
MCHC: 34.1 g/dL (ref 30.0–36.0)
MCV: 87 fL (ref 78.0–100.0)
Platelets: 389 10*3/uL (ref 150–400)
RBC: 4.24 MIL/uL (ref 3.87–5.11)
RDW: 13 % (ref 11.5–15.5)
WBC: 9.3 10*3/uL (ref 4.0–10.5)

## 2016-04-05 ENCOUNTER — Encounter (HOSPITAL_COMMUNITY): Payer: Self-pay | Admitting: *Deleted

## 2016-04-05 ENCOUNTER — Ambulatory Visit (HOSPITAL_COMMUNITY): Payer: Managed Care, Other (non HMO) | Admitting: Anesthesiology

## 2016-04-05 ENCOUNTER — Encounter (HOSPITAL_COMMUNITY)
Admission: RE | Disposition: A | Discharge status: Home / Self Care | Payer: Self-pay | Source: Ambulatory Visit | Attending: Gastroenterology

## 2016-04-05 ENCOUNTER — Ambulatory Visit (HOSPITAL_COMMUNITY)
Admission: RE | Admit: 2016-04-05 | Discharge: 2016-04-05 | Disposition: A | Discharge status: Home / Self Care | Payer: Managed Care, Other (non HMO) | Source: Ambulatory Visit | Attending: Gastroenterology | Admitting: Gastroenterology

## 2016-04-05 DIAGNOSIS — Q438 Other specified congenital malformations of intestine: Secondary | ICD-10-CM | POA: Insufficient documentation

## 2016-04-05 DIAGNOSIS — Z7982 Long term (current) use of aspirin: Secondary | ICD-10-CM | POA: Insufficient documentation

## 2016-04-05 DIAGNOSIS — F329 Major depressive disorder, single episode, unspecified: Secondary | ICD-10-CM | POA: Insufficient documentation

## 2016-04-05 DIAGNOSIS — R634 Abnormal weight loss: Secondary | ICD-10-CM

## 2016-04-05 DIAGNOSIS — K529 Noninfective gastroenteritis and colitis, unspecified: Secondary | ICD-10-CM | POA: Insufficient documentation

## 2016-04-05 DIAGNOSIS — K219 Gastro-esophageal reflux disease without esophagitis: Secondary | ICD-10-CM | POA: Insufficient documentation

## 2016-04-05 DIAGNOSIS — Z8619 Personal history of other infectious and parasitic diseases: Secondary | ICD-10-CM | POA: Insufficient documentation

## 2016-04-05 DIAGNOSIS — M1991 Primary osteoarthritis, unspecified site: Secondary | ICD-10-CM | POA: Insufficient documentation

## 2016-04-05 DIAGNOSIS — R197 Diarrhea, unspecified: Secondary | ICD-10-CM | POA: Diagnosis not present

## 2016-04-05 DIAGNOSIS — Z853 Personal history of malignant neoplasm of breast: Secondary | ICD-10-CM | POA: Insufficient documentation

## 2016-04-05 DIAGNOSIS — F419 Anxiety disorder, unspecified: Secondary | ICD-10-CM | POA: Insufficient documentation

## 2016-04-05 DIAGNOSIS — E782 Mixed hyperlipidemia: Secondary | ICD-10-CM | POA: Insufficient documentation

## 2016-04-05 DIAGNOSIS — K648 Other hemorrhoids: Secondary | ICD-10-CM | POA: Insufficient documentation

## 2016-04-05 DIAGNOSIS — Z01812 Encounter for preprocedural laboratory examination: Secondary | ICD-10-CM | POA: Insufficient documentation

## 2016-04-05 DIAGNOSIS — Z79899 Other long term (current) drug therapy: Secondary | ICD-10-CM | POA: Insufficient documentation

## 2016-04-05 DIAGNOSIS — F17200 Nicotine dependence, unspecified, uncomplicated: Secondary | ICD-10-CM | POA: Insufficient documentation

## 2016-04-05 DIAGNOSIS — K625 Hemorrhage of anus and rectum: Secondary | ICD-10-CM | POA: Insufficient documentation

## 2016-04-05 DIAGNOSIS — I10 Essential (primary) hypertension: Secondary | ICD-10-CM | POA: Insufficient documentation

## 2016-04-05 HISTORY — PX: COLONOSCOPY WITH PROPOFOL: SHX5780

## 2016-04-05 HISTORY — PX: BIOPSY: SHX5522

## 2016-04-05 SURGERY — COLONOSCOPY WITH PROPOFOL
Anesthesia: Monitor Anesthesia Care

## 2016-04-05 MED ORDER — FENTANYL CITRATE (PF) 100 MCG/2ML IJ SOLN
25.0000 ug | INTRAMUSCULAR | Status: AC
  Administered 2016-04-05: 25 ug via INTRAVENOUS

## 2016-04-05 MED ORDER — LACTATED RINGERS IV SOLN
INTRAVENOUS | Status: DC
  Administered 2016-04-05: 12:00:00 via INTRAVENOUS

## 2016-04-05 MED ORDER — MIDAZOLAM HCL 2 MG/2ML IJ SOLN
INTRAMUSCULAR | Status: AC
  Filled 2016-04-05: qty 2

## 2016-04-05 MED ORDER — MIDAZOLAM HCL 5 MG/5ML IJ SOLN
INTRAMUSCULAR | Status: DC | PRN
  Administered 2016-04-05: 2 mg via INTRAVENOUS

## 2016-04-05 MED ORDER — FENTANYL CITRATE (PF) 100 MCG/2ML IJ SOLN: 25.0000 ug | INTRAMUSCULAR | Status: DC | PRN

## 2016-04-05 MED ORDER — PROPOFOL 10 MG/ML IV BOLUS
INTRAVENOUS | Status: AC
  Filled 2016-04-05: qty 20

## 2016-04-05 MED ORDER — SODIUM CHLORIDE 0.9 % IJ SOLN
INTRAMUSCULAR | Status: AC
  Filled 2016-04-05: qty 10

## 2016-04-05 MED ORDER — PROPOFOL 500 MG/50ML IV EMUL
INTRAVENOUS | Status: DC | PRN
  Administered 2016-04-05: 125 ug/kg/min via INTRAVENOUS
  Administered 2016-04-05: 12:00:00 via INTRAVENOUS

## 2016-04-05 MED ORDER — FENTANYL CITRATE (PF) 100 MCG/2ML IJ SOLN
INTRAMUSCULAR | Status: AC
  Filled 2016-04-05: qty 2

## 2016-04-05 MED ORDER — ONDANSETRON HCL 4 MG/2ML IJ SOLN: 4.0000 mg | Freq: Once | INTRAMUSCULAR | Status: DC | PRN

## 2016-04-05 MED ORDER — MIDAZOLAM HCL 2 MG/2ML IJ SOLN
1.0000 mg | INTRAMUSCULAR | Status: DC | PRN
  Administered 2016-04-05: 2 mg via INTRAVENOUS

## 2016-04-05 MED ORDER — EPHEDRINE SULFATE 50 MG/ML IJ SOLN
INTRAMUSCULAR | Status: AC
  Filled 2016-04-05: qty 1

## 2016-04-05 NOTE — Anesthesia Postprocedure Evaluation (Signed)
Anesthesia Post Note  Patient: Rachel Sutton  Procedure(s) Performed: Procedure(s) (LRB): COLONOSCOPY WITH PROPOFOL (N/A) BIOPSY  Patient location during evaluation: PACU Anesthesia Type: MAC Level of consciousness: awake, oriented and patient cooperative Pain management: pain level controlled Vital Signs Assessment: post-procedure vital signs reviewed and stable Respiratory status: nonlabored ventilation and respiratory function stable Cardiovascular status: blood pressure returned to baseline Postop Assessment: no signs of nausea or vomiting Anesthetic complications: no    Last Vitals:  Filed Vitals:   04/05/16 1120 04/05/16 1250  BP: 101/65 112/55  Pulse: 69 66  Temp: 36.6 C 36.7 C  Resp: 23 14    Last Pain: There were no vitals filed for this visit.               Neisha Hinger J

## 2016-04-05 NOTE — Transfer of Care (Signed)
Immediate Anesthesia Transfer of Care Note  Patient: Rachel Sutton  Procedure(s) Performed: Procedure(s) with comments: COLONOSCOPY WITH PROPOFOL (N/A) - 1200 BIOPSY - colon bx  Patient Location: PACU  Anesthesia Type:General  Level of Consciousness: awake, alert  and patient cooperative  Airway & Oxygen Therapy: Patient Spontanous Breathing and Patient connected to face mask oxygen  Post-op Assessment: Report given to RN, Post -op Vital signs reviewed and stable and Patient moving all extremities  Post vital signs: Reviewed and stable  Last Vitals:  Filed Vitals:   04/05/16 1120  BP: 101/65  Pulse: 69  Temp: 36.6 C  Resp: 23    Complications: No apparent anesthesia complications

## 2016-04-05 NOTE — Discharge Instructions (Signed)
NO OBVIOUS SOURCE FOR YOUR DIARRHEA WAS IDENTIFIED. You have internal hemorrhoids and diverticulosis in your right and left colon. The last part of your small intestines is normal. I biopsied your RIGHT  AND LEFT COLON AND RECTUM. You did not have any polyps.   KEEP YOUR APPT AT [facility]. [facility] SAID THEY CALLED YOU ON APR 18. THEIR PHONE NUMBER IS [phone] TO CALL FOR AN APPT.  FOLLOW A DAIRY FREE/low fat DIET. AVOID ITEMS THAT CAUSE BLOATING.  SHE SHOULD AVOID FATTY FOODS, AND CAFFEINE FOR THE NEXT 8 WEEKS. SEE INFO BELOW.   TAKE A PROBIOTIC DAILY FOR 3 MOS([employer], [facility]ON [facility], OR RESTORA).   USE IMODIUM 30 MINS PRIOR TO MEALS AND AT BEDTIME.  YOUR BIOPSY RESULTS WILL BE AVAILABLE IN MY CHART  APR 22 AND   MY OFFICE WILL CONTACT YOU IN 10-14 DAYS WITH YOUR RESULTS. IF YOU STILL HAVE COLLAGENOUS COLITIS, YOU WILL NEED TO STOP TAKING ASPIRIN AND VOLTAREN, AND START ENTOCORT AND PEPTO BISMOL.  FOLLOW UP IN 4 MOS.    Next colonoscopy in 10 years. Colonoscopy Care After Read the instructions outlined below and refer to this sheet in the next week. These discharge instructions provide you with general information on caring for yourself after you leave the hospital. While your treatment has been planned according to the most current medical practices available, unavoidable complications occasionally occur. If you have any problems or questions after discharge, call DR. Alasha Mcguinness, [phone].  ACTIVITY  You may resume your regular activity, but move at a slower pace for the next 24 hours.   Take frequent rest periods for the next 24 hours.   Walking will help get rid of the air and reduce the bloated feeling in your belly (abdomen).   No driving for 24 hours (because of the medicine (anesthesia) used during the test).   You may shower.   Do not sign any important legal documents or operate any machinery for 24 hours (because of the anesthesia used during the test).      NUTRITION  Drink plenty of fluids.   You may resume your normal diet as instructed by your doctor.   Begin with a light meal and progress to your normal diet. Heavy or fried foods are harder to digest and may make you feel sick to your stomach (nauseated).   Avoid alcoholic beverages for 24 hours or as instructed.    MEDICATIONS  You may resume your normal medications.   WHAT YOU CAN EXPECT TODAY  Some feelings of bloating in the abdomen.   Passage of more gas than usual.   Spotting of blood in your stool or on the toilet paper  .  IF YOU HAD POLYPS REMOVED DURING THE COLONOSCOPY:  Eat a soft diet IF YOU HAVE NAUSEA, BLOATING, ABDOMINAL PAIN, OR VOMITING.    FINDING OUT THE RESULTS OF YOUR TEST Not all test results are available during your visit. DR. Oneida Alar WILL CALL YOU WITHIN 14 DAYS OF YOUR PROCEDUE WITH YOUR RESULTS. Do not assume everything is normal if you have not heard from DR. Edward Trevino, CALL HER OFFICE AT [phone].  SEEK IMMEDIATE MEDICAL ATTENTION AND CALL THE OFFICE: [phone] IF:  You have more than a spotting of blood in your stool.   Your belly is swollen (abdominal distention).   You are nauseated or vomiting.   You have a temperature over 101F.   You have abdominal pain or discomfort that is severe or gets worse throughout the day.  Low-Fat Diet BREADS, CEREALS, PASTA, RICE, DRIED PEAS, AND BEANS These products are high in carbohydrates and most are low in fat. Therefore, they can be increased in the diet as substitutes for fatty foods. They too, however, contain calories and should not be eaten in excess. Cereals can be eaten for snacks as well as for breakfast.   FRUITS AND VEGETABLES It is good to eat fruits and vegetables. Besides being sources of fiber, both are rich in vitamins and some minerals. They help you get the daily allowances of these nutrients. Fruits and vegetables can be used for snacks and desserts.  MEATS Limit lean  meat, chicken, [place], and fish to no more than 6 ounces per day. Beef, Pork, and Lamb Use lean cuts of beef, pork, and lamb. Lean cuts include:  Extra-lean ground beef.  Arm roast.  Sirloin tip.  Center-cut ham.  Round steak.  Loin chops.  Rump roast.  Tenderloin.  Trim all fat off the outside of meats before cooking. It is not necessary to severely decrease the intake of red meat, but lean choices should be made. Lean meat is rich in protein and contains a highly absorbable form of iron. Premenopausal women, in particular, should avoid reducing lean red meat because this could increase the risk for low red blood cells (iron-deficiency anemia).  Chicken and [place] These are good sources of protein. The fat of poultry can be reduced by removing the skin and underlying fat layers before cooking. Chicken and [place] can be substituted for lean red meat in the diet. Poultry should not be fried or covered with high-fat sauces. Fish and Shellfish Fish is a good source of protein. Shellfish contain cholesterol, but they usually are low in saturated fatty acids. The preparation of fish is important. Like chicken and [place], they should not be fried or covered with high-fat sauces. EGGS Egg whites contain no fat or cholesterol. They can be eaten often. Try 1 to 2 egg whites instead of whole eggs in recipes or use egg substitutes that do not contain yolk. MILK AND DAIRY PRODUCTS Use skim or 1% milk instead of 2% or whole milk. Decrease whole milk, natural, and processed cheeses. Use nonfat or low-fat (2%) cottage cheese or low-fat cheeses made from vegetable oils. Choose nonfat or low-fat (1 to 2%) yogurt. Experiment with evaporated skim milk in recipes that call for heavy cream. Substitute low-fat yogurt or low-fat cottage cheese for sour cream in dips and salad dressings. Have at least 2 servings of low-fat dairy products, such as 2 glasses of skim (or 1%) milk each day to help get your daily calcium  intake. FATS AND OILS Reduce the total intake of fats, especially saturated fat. Butterfat, lard, and beef fats are high in saturated fat and cholesterol. These should be avoided as much as possible. Vegetable fats do not contain cholesterol, but certain vegetable fats, such as coconut oil, palm oil, and palm kernel oil are very high in saturated fats. These should be limited. These fats are often used in bakery goods, processed foods, popcorn, oils, and nondairy creamers. Vegetable shortenings and some peanut butters contain hydrogenated oils, which are also saturated fats. Read the labels on these foods and check for saturated vegetable oils. Unsaturated vegetable oils and fats do not raise blood cholesterol. However, they should be limited because they are fats and are high in calories. Total fat should still be limited to 30% of your daily caloric intake. Desirable liquid vegetable oils are corn oil, cottonseed  oil, olive oil, canola oil, safflower oil, soybean oil, and sunflower oil. Peanut oil is not as good, but small amounts are acceptable. Buy a heart-healthy tub margarine that has no partially hydrogenated oils in the ingredients. Mayonnaise and salad dressings often are made from unsaturated fats, but they should also be limited because of their high calorie and fat content. Seeds, nuts, peanut butter, olives, and avocados are high in fat, but the fat is mainly the unsaturated type. These foods should be limited mainly to avoid excess calories and fat. OTHER EATING TIPS Snacks  Most sweets should be limited as snacks. They tend to be rich in calories and fats, and their caloric content outweighs their nutritional value. Some good choices in snacks are graham crackers, melba toast, soda crackers, bagels (no egg), English muffins, fruits, and vegetables. These snacks are preferable to snack crackers, [place] fries, TORTILLA CHIPS, and POTATO chips. Popcorn should be air-popped or cooked in small  amounts of liquid vegetable oil. Desserts Eat fruit, low-fat yogurt, and fruit ices instead of pastries, cake, and cookies. Sherbet, angel food cake, gelatin dessert, frozen low-fat yogurt, or other frozen products that do not contain saturated fat (pure fruit juice bars, frozen ice pops) are also acceptable.   High-Fiber Diet A high-fiber diet changes your normal diet to include more whole grains, legumes, fruits, and vegetables. Changes iCOOKING METHODS Choose those methods that use little or no fat. They include: Poaching.  Braising.  Steaming.  Grilling.  Baking.  Stir-frying.  Broiling.  Microwaving.  Foods can be cooked in a nonstick pan without added fat, or use a nonfat cooking spray in regular cookware. Limit fried foods and avoid frying in saturated fat. Add moisture to lean meats by using water, broth, cooking wines, and other nonfat or low-fat sauces along with the cooking methods mentioned above. Soups and stews should be chilled after cooking. The fat that forms on top after a few hours in the refrigerator should be skimmed off. When preparing meals, avoid using excess salt. Salt can contribute to raising blood pressure in some people.  EATING AWAY FROM HOME Order entres, potatoes, and vegetables without sauces or butter. When meat exceeds the size of a deck of cards (3 to 4 ounces), the rest can be taken home for another meal. Choose vegetable or fruit salads and ask for low-calorie salad dressings to be served on the side. Use dressings sparingly. Limit high-fat toppings, such as bacon, crumbled eggs, cheese, sunflower seeds, and olives. Ask for heart-healthy tub margarine instead of butter.  Lactose Free Diet Lactose is a carbohydrate that is found mainly in milk and milk products, as well as in foods with added milk or whey. Lactose must be digested by the enzyme in order to be used by the body. Lactose intolerance occurs when there is a shortage of lactase. When your body  is not able to digest lactose, you may feel sick to your stomach (nausea), bloating, cramping, gas and diarrhea.  There are many dairy products that may be tolerated better than milk by some people:  The use of cultured dairy products such as yogurt, buttermilk, cottage cheese, and sweet acidophilus milk (Kefir) for lactase-deficient individuals is usually well tolerated. This is because the healthy bacteria help digest lactose.   Lactose-hydrolyzed milk (Lactaid) contains 40-90% less lactose than milk and may also be well tolerated.    SPECIAL NOTES  Lactose is a carbohydrates. The major food source is dairy products. Reading food labels is important. Many  products contain lactose even when they are not made from milk. Look for the following words: whey, milk solids, dry milk solids, nonfat dry milk powder. Typical sources of lactose other than dairy products include breads, candies, cold cuts, prepared and processed foods, and commercial sauces and gravies.   All foods must be prepared without milk, cream, or other dairy foods.   Soy milk and lactose-free supplements (LACTASE) may be used as an alternative to milk.   FOOD GROUP ALLOWED/RECOMMENDED AVOID/USE SPARINGLY  BREADS / STARCHES 4 servings or more* Breads and rolls made without milk. [place], [place], or [place] bread. Breads and rolls that contain milk. Prepared mixes such as muffins, biscuits, waffles, pancakes. Sweet rolls, donuts, [place] toast (if made with milk or lactose).  Crackers: Soda crackers, graham crackers. Any crackers prepared without lactose. Zwieback crackers, corn curls, or any that contain lactose.  Cereals: Cooked or dry cereals prepared without lactose (read labels). Cooked or dry cereals prepared with lactose (read labels). Total, Cocoa Krispies. Special K.  Potatoes / Pasta / Rice: Any prepared without milk or lactose. Popcorn. Instant potatoes, frozen [place] fries, scalloped or au gratin potatoes.    VEGETABLES 2 servings or more Fresh, frozen, and canned vegetables. Creamed or breaded vegetables. Vegetables in a cheese sauce or with lactose-containing margarines.  FRUIT 2 servings or more All fresh, canned, or frozen fruits that are not processed with lactose. Any canned or frozen fruits processed with lactose.  MEAT & SUBSTITUTES 2 servings or more (4 to 6 oz. total per day) Plain beef, chicken, fish, [place], lamb, veal, pork, or ham. Kosher prepared meat products. Strained or junior meats that do not contain milk. Eggs, soy meat substitutes, nuts. Scrambled eggs, omelets, and souffles that contain milk. Creamed or breaded meat, fish, or fowl. Sausage products such as wieners, liver sausage, or cold cuts that contain milk solids. Cheese, cottage cheese, or cheese spreads.  MILK None. (See BEVERAGES for milk substitutes. See DESSERTS for ice cream and frozen desserts.) Milk (whole, 2%, skim, or chocolate). Evaporated, powdered, or condensed milk; malted milk.  SOUPS & COMBINATION FOODS Bouillon, broth, vegetable soups, clear soups, consomms. Homemade soups made with allowed ingredients. Combination or prepared foods that do not contain milk or milk products (read labels). Cream soups, chowders, commercially prepared soups containing lactose. Macaroni and cheese, pizza. Combination or prepared foods that contain milk or milk products.  DESSERTS & SWEETS In moderation Water and fruit ices; gelatin; angel food cake. Homemade cookies, pies, or cakes made from allowed ingredients. Pudding (if made with water or a milk substitute). Lactose-free tofu desserts. Sugar, honey, corn syrup, jam, jelly; marmalade; molasses (beet sugar); Pure sugar candy; marshmallows. Ice cream, ice milk, sherbet, custard, pudding, frozen yogurt. Commercial cake and cookie mixes. Desserts that contain chocolate. Pie crust made with milk-containing margarine; reduced-calorie desserts made with a sugar substitute that  contains lactose. Toffee, peppermint, butterscotch, chocolate, caramels.  FATS & OILS In moderation Butter (as tolerated; contains very small amounts of lactose). Margarines and dressings that do not contain milk, Vegetable oils, shortening, Miracle Whip, mayonnaise, nondairy cream & whipped toppings without lactose or milk solids added (examples: Coffee Rich, Carnation Coffeemate, Rich's Whipped Topping, PolyRich). Berniece Salines. Margarines and salad dressings containing milk; cream, cream cheese; peanut butter with added milk solids, sour cream, chip dips, made with sour cream.  BEVERAGES Carbonated drinks; tea; coffee and freeze-dried coffee; some instant coffees (check labels). Fruit drinks; fruit and vegetable juice; Rice or Soy milk. Ovaltine, hot chocolate. Some  cocoas; some instant coffees; instant iced teas; powdered fruit drinks (read labels).   CONDIMENTS / MISCELLANEOUS Soy sauce, carob powder, olives, gravy made with water, baker's cocoa, pickles, pure seasonings and spices, wine, pure monosodium glutamate, catsup, mustard. Some chewing gums, chocolate, some cocoas. Certain antibiotics and vitamin / mineral preparations. Spice blends if they contain milk products. MSG extender. Artificial sweeteners that contain lactose such as Equal (Nutra-Sweet) and Sweet 'n Low. Some nondairy creamers (read labels).   SAMPLE MENU*  Breakfast   Orange Juice.  Banana.   Bran flakes.   Nondairy Creamer.  [place] Bread (toasted).   Butter or milk-free margarine.   Coffee or tea.    Noon Meal   Chicken Breast.  Rice.   Green beans.   Butter or milk-free margarine.  Fresh melon.   Coffee or tea.    Evening Meal   Roast Beef.  Baked potato.   Butter or milk-free margarine.   Broccoli.   Lettuce salad with vinegar and oil dressing.  [employer]   Coffee or tea.

## 2016-04-05 NOTE — Op Note (Signed)
[facility] Patient Name: Rachel Sutton Procedure Date: 04/05/2016 12:09 PM MRN: [id number] Date of Birth: Aug 16, 1961 Attending MD: Barney Drain , MD CSN: [id number] Age: 55 Admit Type: Outpatient Procedure:                Colonoscopy Indications:              Clinically significant diarrhea of unexplained                            origin, Weight loss Providers:                Barney Drain, MD, Gwenlyn Fudge, RN, Isabella Stalling,                            Technician Referring MD:             Jasper Loser. Luan Pulling, MD Medicines:                Propofol per Anesthesia Complications:            No immediate complications. Estimated Blood Loss:     Estimated blood loss was minimal. Procedure:                Pre-Anesthesia Assessment:                           - Prior to the procedure, a History and Physical                            was performed, and patient medications and                            allergies were reviewed. The patient's tolerance of                            previous anesthesia was also reviewed. The risks                            and benefits of the procedure and the sedation                            options and risks were discussed with the patient.                            All questions were answered, and informed consent                            was obtained. Prior Anticoagulants: The patient has                            taken aspirin, last dose was day of procedure. ASA                            Grade Assessment: II - A patient with mild systemic  disease. After reviewing the risks and benefits,                            the patient was deemed in satisfactory condition to                            undergo the procedure.                           After obtaining informed consent, the colonoscope                            was passed under direct vision. Throughout the                            procedure, the patient's  blood pressure, pulse, and                            oxygen saturations were monitored continuously. The                            [id number] scope was introduced through                            the anus and advanced to the the terminal ileum.                            The terminal ileum, ileocecal valve, appendiceal                            orifice, and rectum were photographed. The                            colonoscopy was performed without difficulty. The                            patient tolerated the procedure well. The quality                            of the bowel preparation was good. Scope In: 12:23:10 PM Scope Out: 12:41:15 PM Scope Withdrawal Time: 0 hours 15 minutes 6 seconds  Total Procedure Duration: 0 hours 18 minutes 5 seconds  Findings:      The perianal and digital rectal examinations were normal.      The terminal ileum appeared normal.      The recto-sigmoid colon was mildly redundant. This was biopsied with a       cold forceps [Purpose].      Non-bleeding internal hemorrhoids were found. The hemorrhoids were       moderate. Impression:               - The examined portion of the ileum was normal.                           - Redundant colon.                           -  Non-bleeding internal hemorrhoids.                           NO OBVIOUS SOURCE FOR DIARRHEA IDENTIFIED Moderate Sedation:      Per Anesthesia Care Recommendation:           - Patient has a contact number available for                            emergencies. The signs and symptoms of potential                            delayed complications were discussed with the                            patient. Return to normal activities tomorrow.                            Written discharge instructions were provided to the                            patient.                           - Low fat diet and lactose free diet.                           - Continue present medications.                            - Await pathology results.                           - Repeat colonoscopy in 10 years for surveillance.                           - Return to my office in 2 months.                           KEEP YOUR APPT AT [facility]. [facility] SAID THEY CALLED                            YOU ON APR 18. THEIR PHONE NUMBER IS [phone]                            TO CALL FOR AN APPT.                           TAKE A PROBIOTIC DAILY FOR 3 MOS([employer],                            [employer], OR [employer]).                           USE IMODIUM 30 MINS PRIOR TO MEALS AND AT BEDTIME. Procedure Code(s):        ---  Professional ---                           [phone], Colonoscopy, flexible; with biopsy, single                            or multiple Diagnosis Code(s):        --- Professional ---                           K64.8, Other hemorrhoids                           R19.7, Diarrhea, unspecified                           R63.4, Abnormal weight loss                           Q43.8, Other specified congenital malformations of                            intestine CPT copyright 2016 American [employer] Association. All rights reserved. The codes documented in this report are preliminary and upon coder review may  be revised to meet current compliance requirements. Barney Drain, MD Barney Drain, MD 04/05/2016 1:16:29 PM This report has been signed electronically. Number of Addenda: 0

## 2016-04-05 NOTE — Interval H&P Note (Signed)
History and Physical Interval Note:  04/05/2016 11:23 AM  Rachel Sutton  has presented today for surgery, with the diagnosis of diarrhea  The various methods of treatment have been discussed with the patient and family. After consideration of risks, benefits and other options for treatment, the patient has consented to  Procedure(s) with comments: COLONOSCOPY WITH PROPOFOL (N/A) - 1200 as a surgical intervention .  The patient's history has been reviewed, patient examined, no change in status, stable for surgery.  I have reviewed the patient's chart and labs.  Questions were answered to the patient's satisfaction.     [employer]

## 2016-04-05 NOTE — H&P (View-Only) (Signed)
Subjective:    Patient ID: Rachel Sutton, female    DOB: Dec 22, 1960, 55 y.o.   MRN: [id number]  HAWKINS,EDWARD L, MD   HPI Having BMs if she eats drinks anything(LGE QUANTITY, BWN, YLW, GRN). C DIFF PCR NEGATIVE. SUBJECTIVE CHILLS THIS WEEKEND. NAUSEA: MILD, COMES AND GOES. HAS CRAMPING AS WELL. HAD RECTAL BLEEDING SMALL AMOUNT AFTER STRAINING. XIFAXAN: URGE TO HAVE BM BUT NOTHING CAME OUT, CHILLS, RECTAL BLEEDING, NAUSEA. ABDOMINAL ONLY HAPPENS BEFORE BMS AND GETS BETTER AFTER BMs. TRIED IMODIUM AND PEPTO BISMOL TO TRY TO SLOW DOWN STOOLS. NO TRAVEL, ABX, OR FRESH WATER, NO SICK CONTACTS. NO SORES IN MOUTH, RASH ON LEGS, JOINT PAIN, OR BACK PAIN. BUTT HURTS BUT ANUSOL MADE IT BETTER.   PT DENIES FEVER, CHILLS, HEMATOCHEZIA, Vomiting, CHEST PAIN, SHORTNESS OF BREATH, CHANGE IN BOWEL IN HABITS, problems swallowing, problems with sedation, OR heartburn or indigestion.  Past Medical History  Diagnosis Date  . Fuchs' corneal dystrophy   . Essential hypertension, benign   . Mixed hyperlipidemia   . Mitral valve prolapse     Mild mitral regurgitation  . GERD (gastroesophageal reflux disease)   . Arthritis   . Anxiety   . Breast cancer ([place]) 02/22/2012    Right T1c, N0  . Depression   . Seizures ([place])     Only with dose of haldol; no more since that was stopped.  . Colitis, collagenous 09/22/2015  . C. difficile colitis    Past Surgical History  Procedure Laterality Date  . Carpal tunnel r/l  1992  . Ray cage fusion  1997  . Cervical spine fusion x2 since last visit    . Elbow surgery Right  tennis elbow release 2012  . Abdominal hysterectomy  1999  . Sentinel lymph node biopsy  10/30/2002    right  . Ovarian cyst removal      prior to hysterectomy, right  . Breast lumpectomy  10/30/2002    right  . Esophagogastroduodenoscopy  10/26/2012    Procedure: ESOPHAGOGASTRODUODENOSCOPY (EGD);  Surgeon: Inda Castle, MD;  Location: [facility];  Service: Endoscopy;  Laterality: N/A;    . Left heart catheterization with coronary angiogram N/A 10/23/2012      . Knee arthroscopy Left   . Colonoscopy with propofol N/A 09/28/2015      . Esophagogastroduodenoscopy (egd) with propofol N/A 09/28/2015      . Biopsy N/A 09/28/2015       Current Outpatient Prescriptions  Medication Sig Dispense Refill  . ALPRAZolam (XANAX) 1 MG tablet Take 1 tablet (1 mg total) by mouth 4 (four) times daily.    Marland Kitchen aspirin EC 81 MG EC tablet Take 1 tablet (81 mg total) by mouth daily.    . Biotin 5 MG TABS Take 1 tablet by mouth daily.    .      . CALCIUM 500 PO) Take 1 tablet by mouth 2 (two) times daily.    . diclofenac (VOLTAREN) 75 MG EC tablet     . EPINEPHrine (EPI-PEN) 0.3 mg/0.3 mL DEVI Inject 0.3 mg into the muscle as needed. For bee venom    . fish oil-omega-3 fatty acids 1000 MG capsule Take 2 g by mouth every morning.    . hydrochlorothiazide (MICROZIDE) 12.5 MG capsule TAKE 1 CAPSULE BY MOUTH EVERY DAY    . hydrocortisone (ANUSOL-HC) 2.5 % rectal cream Place 1 application rectally 2 (two) times daily.    . metoprolol succinate (TOPROL-XL) 50 MG 24 hr tablet Take 1 tablet (  50 mg total) by mouthBID    . Multiple Vitamin (MULITIVITAMIN WITH MINERALS) TABS Take 1 tablet by mouth every morning.     NIFEDIPINE Take 1 tablet (90 mg total) by mouth daily.    . nitroGLYCERIN (NITROSTAT) 0.4 MG SL tablet Place 1 tablet Q5 mins x 3 doses as needed for chest pain.    . pantoprazole (PROTONIX) 40 MG tablet Take 1 tablet (40 mg total) by mouth daily.    Marland Kitchen PARoxetine (PAXIL) 20 MG tablet Take 1.5 tablets (30 mg total) by mouth daily.    Marland Kitchen FLORASTOR) 250 MG capsule 1 PO BID FOR 3 MOS    . simvastatin (ZOCOR) 40 MG tablet Take 40 mg by mouth every morning.     . sodium chloride (MURO 128) 5 % ophthalmic solution Place 1 drop into both eyes as needed for irritation.     . traMADol (ULTRAM) 50 MG tablet Take 50 mg by [facility] as needed for moderate pain.     .       Review of Systems PER HPI  OTHERWISE ALL SYSTEMS ARE NEGATIVE.    Objective:   Physical Exam  Constitutional: She is oriented to person, place, and time. She appears well-developed and well-nourished. No distress.  HENT:  Head: Normocephalic and atraumatic.  Mouth/Throat: Oropharynx is clear and moist. No oropharyngeal exudate.  Eyes: Pupils are equal, round, and reactive to light. No scleral icterus.  Neck: Normal range of motion. Neck supple.  Cardiovascular: Normal rate, regular rhythm and normal heart sounds.   Pulmonary/Chest: Effort normal and breath sounds normal. No respiratory distress.  Abdominal: Soft. Bowel sounds are normal. She exhibits no distension. There is no tenderness.  Musculoskeletal: She exhibits no edema.  Lymphadenopathy:    She has no cervical adenopathy.  Neurological: She is alert and oriented to person, place, and time.  NO FOCAL DEFICITS  Psychiatric: She has a normal mood and affect.  Vitals reviewed.     Assessment & Plan:

## 2016-04-05 NOTE — Anesthesia Preprocedure Evaluation (Signed)
Anesthesia Evaluation  Patient identified by MRN, date of birth, ID band Patient awake    Reviewed: Allergy & Precautions, H&P , NPO status , Patient's Chart, lab work & pertinent test results, reviewed documented beta blocker date and time   Airway Mallampati: II  TM Distance: >3 FB Neck ROM: Limited    Dental no notable dental hx.    Pulmonary Current Smoker,    Pulmonary exam normal        Cardiovascular hypertension, Pt. on medications and Pt. on home beta blockers + Valvular Problems/Murmurs (hx tachycardia) MVP  Rhythm:Regular Rate:Normal     Neuro/Psych Seizures - (inactive),  PSYCHIATRIC DISORDERS Anxiety Depression negative neurological ROS     GI/Hepatic Neg liver ROS, GERD  ,  Endo/Other  negative endocrine ROS  Renal/GU negative Renal ROS     Musculoskeletal  (+) Arthritis ,   Abdominal   Peds  Hematology negative hematology ROS (+)   Anesthesia Other Findings   Reproductive/Obstetrics                             Anesthesia Physical Anesthesia Plan  ASA: II  Anesthesia Plan: MAC   Post-op Pain Management:    Induction: Intravenous  Airway Management Planned: Simple Face Mask  Additional Equipment:   Intra-op Plan:   Post-operative Plan:   Informed Consent: I have reviewed the patients History and Physical, chart, labs and discussed the procedure including the risks, benefits and alternatives for the proposed anesthesia with the patient or authorized representative who has indicated his/her understanding and acceptance.   Dental advisory given  Plan Discussed with: CRNA  Anesthesia Plan Comments:         Anesthesia Quick Evaluation

## 2016-04-06 ENCOUNTER — Telehealth: Payer: Self-pay | Admitting: Gastroenterology

## 2016-04-06 ENCOUNTER — Encounter: Payer: Self-pay | Admitting: Gastroenterology

## 2016-04-06 NOTE — Telephone Encounter (Signed)
PLEASE CALL [facility]. PT NEEDS AN APPT IN MAY 2017 IF POSSIBLE. SHE IS HAVING DEBILITATING DIARRHEA AND NEEDS TO BE SEEN BY A TERTIARY CARE GI DOC.

## 2016-04-06 NOTE — Telephone Encounter (Signed)
Pt called this morning wanting SF to know that she has an appt at [facility] on August 18th and she might need to add that with her FMLA papers that were sent earlier this week.

## 2016-04-07 ENCOUNTER — Telehealth: Payer: Self-pay | Admitting: Gastroenterology

## 2016-04-07 MED ORDER — BISMUTH SUBSALICYLATE 262 MG PO CHEW: CHEWABLE_TABLET | ORAL | Status: DC

## 2016-04-07 MED ORDER — BUDESONIDE 3 MG PO CPEP: 9.0000 mg | ORAL_CAPSULE | Freq: Every day | ORAL | Status: DC

## 2016-04-07 NOTE — Telephone Encounter (Signed)
Called patient TO DISCUSS RESULTS. STILL HAS LYMPHOCYTIC COLITIS. STOP ASA/VOLTAREN. START PEPTO 3 PILLS TID FOR 12 WEEKS AND ENTOCORT 9 MG DAILY FOR 12 WEEKS THEN WILL TAPER 6 MG FOR ONE MO THE 3 MG FOR ONE MO. CONTINUE IMODIUM-HELPING SOME. MAY NEED TO CHANGE TO ANOTHER ANTI-MOTILITY AGENT: DONNATAL OR ?? WILL TRY TO GET PT IN AT [facility] IN NEXT 6 WEEKS.

## 2016-04-07 NOTE — Telephone Encounter (Signed)
REVIEWED-NO ADDITIONAL RECOMMENDATIONS. 

## 2016-04-09 ENCOUNTER — Encounter: Payer: Self-pay | Admitting: Cardiology

## 2016-04-09 NOTE — Telephone Encounter (Signed)
LMOM for pt regarding appt change at [facility] for 05/01 @ 1100 am and regarding her new RX

## 2016-04-09 NOTE — Telephone Encounter (Signed)
Pt has new appt at [facility] on 05/01 @ 11 am Called pt and [facility]

## 2016-04-10 ENCOUNTER — Encounter (HOSPITAL_COMMUNITY): Payer: Self-pay | Admitting: Gastroenterology

## 2016-04-16 ENCOUNTER — Telehealth (HOSPITAL_COMMUNITY): Payer: Self-pay | Admitting: *Deleted

## 2016-04-16 NOTE — Telephone Encounter (Signed)
Pt called and lm for office to call her back due to recent treatment. Called pt and she stated Rare form of Colitis and the Paxil may be contributing to this. Per pt, she went to [facility] today and that's what they informed her and wanted her to talk to Dr. Harrington Challenger to see if she could switch her to another medication in a different class. Informed pt that she would need to call [facility] and request that her records get sent to office and for pt to come into office to discuss med changes with provider. Per pt, she have an appt already scheduled for 05-03-16 and will just keep this date and get a hold of [facility] to have her records sent to office. Pt number is [phone].

## 2016-04-17 ENCOUNTER — Telehealth: Payer: Self-pay | Admitting: Gastroenterology

## 2016-04-17 NOTE — Telephone Encounter (Signed)
LMOM that her paper work would be faxed today by 5:00 pm

## 2016-04-17 NOTE — Telephone Encounter (Signed)
noted 

## 2016-04-17 NOTE — Telephone Encounter (Signed)
I called pt and she is aware FMLA papers are complete and would be faxed before 5pm today per [facility]

## 2016-04-17 NOTE — Telephone Encounter (Signed)
Pt called earlier this morning asking to speak with GF. I told her GF was with a patient and I could take a message. She said that she had called yesterday asking about her FMLA forms and time was getting close and she hadn't heard from [place]. I told her that DS was out today and SF wasn't in the office today, but I would follow up with them and someone would call her back. [phone]

## 2016-04-17 NOTE — Telephone Encounter (Signed)
Faxed to [phone]

## 2016-04-25 ENCOUNTER — Encounter: Payer: Self-pay | Admitting: Gastroenterology

## 2016-05-03 ENCOUNTER — Encounter (HOSPITAL_COMMUNITY): Payer: Self-pay | Admitting: Psychiatry

## 2016-05-03 ENCOUNTER — Ambulatory Visit (INDEPENDENT_AMBULATORY_CARE_PROVIDER_SITE_OTHER): Payer: Managed Care, Other (non HMO) | Admitting: Psychiatry

## 2016-05-03 VITALS — BP 103/88 | HR 70 | Ht 64.0 in | Wt 173.6 lb

## 2016-05-03 DIAGNOSIS — F322 Major depressive disorder, single episode, severe without psychotic features: Secondary | ICD-10-CM

## 2016-05-03 MED ORDER — PAROXETINE HCL 20 MG PO TABS: 20.0000 mg | ORAL_TABLET | Freq: Every day | ORAL | Status: DC

## 2016-05-03 MED ORDER — ALPRAZOLAM 1 MG PO TABS: 1.0000 mg | ORAL_TABLET | Freq: Four times a day (QID) | ORAL | Status: DC
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
## 2016-05-10 ENCOUNTER — Ambulatory Visit: Payer: Self-pay | Admitting: Gastroenterology

## 2016-05-24 ENCOUNTER — Ambulatory Visit: Payer: Self-pay | Admitting: Gastroenterology

## 2016-05-30 ENCOUNTER — Ambulatory Visit (INDEPENDENT_AMBULATORY_CARE_PROVIDER_SITE_OTHER): Payer: Managed Care, Other (non HMO) | Admitting: Gastroenterology

## 2016-05-30 ENCOUNTER — Encounter: Payer: Self-pay | Admitting: Gastroenterology

## 2016-05-30 VITALS — BP 104/69 | HR 74 | Temp 97.8°F | Ht 64.0 in | Wt 173.6 lb

## 2016-05-30 DIAGNOSIS — A047 Enterocolitis due to Clostridium difficile: Secondary | ICD-10-CM | POA: Diagnosis not present

## 2016-05-30 DIAGNOSIS — K529 Noninfective gastroenteritis and colitis, unspecified: Secondary | ICD-10-CM

## 2016-05-30 DIAGNOSIS — K52831 Collagenous colitis: Secondary | ICD-10-CM

## 2016-05-30 DIAGNOSIS — K219 Gastro-esophageal reflux disease without esophagitis: Secondary | ICD-10-CM

## 2016-05-30 DIAGNOSIS — A0472 Enterocolitis due to Clostridium difficile, not specified as recurrent: Secondary | ICD-10-CM

## 2016-05-30 MED ORDER — BUDESONIDE 3 MG PO CPEP: ORAL_CAPSULE | ORAL | Status: DC

## 2016-05-30 NOTE — Progress Notes (Signed)
ON RECALL  °

## 2016-05-30 NOTE — Assessment & Plan Note (Signed)
SYMPTOMS CONTROLLED/RESOLVED.  CONTINUE TO MONITOR SYMPTOMS. 

## 2016-05-30 NOTE — Assessment & Plan Note (Signed)
SYMPTOMS NOT IDEALLY CONTROLLED OFF PPI. NOW SYMPTOMS FAIRLY WELL CONTROLLED.  CONTINUE PROTONIX. TAKE 30 MINUTES PRIOR TO BREAKFAST. CONTINUE TO MONITOR SYMPTOMS. FOLLOW UP IN 4 MOS.

## 2016-05-30 NOTE — Progress Notes (Addendum)
Subjective:    Patient ID: Rachel Sutton, female    DOB: [birth year], 55 y.o.   MRN: [id number]  Alonza Bogus, MD  HPI  Still on PAXIL. GERD CAME BACK WITH VENGANCE WHEN SHE TRIED TO STOP PPI. BM: #6 1-2 DAYS A WEEK. OTHERWISE #4.  TAKING ENTCORT 9 MG DAILY. RARE HEARTBURN WHILE  BACK ON PROTONIX FOR GERD(ONCE DAILY).   PT DENIES FEVER, CHILLS, HEMATOCHEZIA, HEMATEMESIS, nausea, vomiting, melena, diarrhea, CHEST PAIN, SHORTNESS OF BREATH, CHANGE IN BOWEL IN HABITS, constipation, abdominal pain, OR problems swallowing.   Past Medical History  Diagnosis Date  . Fuchs' corneal dystrophy   . Essential hypertension, benign   . Mixed hyperlipidemia   . Mitral valve prolapse     Mild mitral regurgitation  . GERD (gastroesophageal reflux disease)   . Arthritis   . Anxiety   . Breast cancer ([place]) 02/22/2012    Right T1c, N0  . Depression   . Seizures ([place])     Only with dose of haldol; no more since that was stopped.  . Colitis, collagenous 09/22/2015  . C. difficile colitis    Past Surgical History  Procedure Laterality Date  . Carpal tunnel r/l  1992  . Ray cage fusion  1997  . Cervical spine fusion x2 since last visit    . Elbow surgery Right  tennis elbow release 2012  . Abdominal hysterectomy  1999  . Sentinel lymph node biopsy  10/30/2002    right  . Ovarian cyst removal      prior to hysterectomy, right  . Breast lumpectomy  10/30/2002    right  . Esophagogastroduodenoscopy  10/26/2012    Procedure: ESOPHAGOGASTRODUODENOSCOPY (EGD);  Surgeon: Inda Castle, MD;  Location: [facility];  Service: Endoscopy;  Laterality: N/A;  . Left heart catheterization with coronary angiogram N/A 10/23/2012    Procedure: LEFT HEART CATHETERIZATION WITH CORONARY ANGIOGRAM;  Surgeon: Peter M Martinique, MD;  Location[facility] CATH LAB;  Service: Cardiovascular;  Laterality: N/A;  . Knee arthroscopy Left   . Colonoscopy with propofol N/A 09/28/2015    Procedure: COLONOSCOPY WITH PROPOFOL;   Surgeon: Danie Binder, MD;  Location: [facility];  Service: Endoscopy;  Laterality: N/A;  procedure 1 cecum time in 1232  time out 1246   total time 14 mintes  . Esophagogastroduodenoscopy (egd) with propofol N/A 09/28/2015    Procedure: ESOPHAGOGASTRODUODENOSCOPY (EGD) WITH PROPOFOL;  Surgeon: Danie Binder, MD;  Location: [facility];  Service: Endoscopy;  Laterality: N/A;  . Biopsy N/A 09/28/2015    Procedure: BIOPSY;  Surgeon: Danie Binder, MD;  Location: [facility];  Service: Endoscopy;  Laterality: N/A;  . Colonoscopy with propofol N/A 04/05/2016    Procedure: COLONOSCOPY WITH PROPOFOL;  Surgeon: Danie Binder, MD;  Location: [facility];  Service: Endoscopy;  Laterality: N/A;  1200  . Biopsy  04/05/2016    Procedure: BIOPSY;  Surgeon: Danie Binder, MD;  Location: [facility];  Service: Endoscopy;;  colon bx    Allergies  Allergen Reactions  . Bee Venom Anaphylaxis  . Haloperidol Lactate Other (See Comments)    Convulsions   . Meperidine Hcl Hives  . Xifaxan [Rifaximin] Other (See Comments)    Multiple complaints     Current Outpatient Prescriptions  Medication Sig Dispense Refill  . ALPRAZolam (XANAX) 1 MG tablet Take 1 tablet (1 mg total) by mouth 4 (four) times daily.    . Biotin 5 MG TABS Take 1 tablet by mouth  daily.    Marland Kitchen PROTONIX DAILY    . ENTOCORT EC) 3 MG 24 hr capsule Take 3 capsules (9 mg total) by mouth daily.    . Calcium-Magnesium-Vitamin D  Take 1 tablet by mouth 2 (two) times daily.    Marland Kitchen EPINEPHrine 0.3 mg/0.3 mL DEVI Inject 0.3 mg into the muscle as needed. For bee venom    . fish oil 1000 MG capsule Take 2 g by mouth every morning.    . hydrochlorothiazide  12.5 MG capsule TAKE 1 CAPSULE BY MOUTH EVERY DAY    . ANUSOL-HC 2.5 % rectal cream Place 1 application rectally 2 (two) times daily. PRN   . TOPROL-XL) 50 MG 24 hr tablet Take 1 tablet (50 mg total) by mouth 2 (two) times daily.    Edd Fabian WITH MINERALS TABS Take 1 tablet by mouth every morning.      Marland Kitchen NITROSTAT 0.4 MG SL tablet Place 1  under the tongue Q5 MINS x 3 doses PRN for chest pain.    Marland Kitchen PARoxetine (PAXIL) 20 MG tablet Take 1 tablet (20 mg total) by mouth daily.    Marland Kitchen [place]    . simvastatin (ZOCOR) 40 MG tablet Take 40 mg by mouth every morning.     Marland Kitchen       ULTRAM Take 50 mg PO Q6H for moderate pain.  PRN   . NIFEdipine XL 90 MG 24 hr tablet Take 1 tablet (90 mg total) by mouth daily.     Review of Systems PER HPI OTHERWISE ALL SYSTEMS ARE NEGATIVE.    Objective:   Physical Exam  Constitutional: She is oriented to person, place, and time. She appears well-developed and well-nourished. No distress.  HENT:  Head: Normocephalic and atraumatic.  Mouth/Throat: Oropharynx is clear and moist. No oropharyngeal exudate.  Eyes: Pupils are equal, round, and reactive to light. No scleral icterus.  Neck: Normal range of motion. Neck supple.  Cardiovascular: Normal rate, regular rhythm and normal heart sounds.   Pulmonary/Chest: Effort normal and breath sounds normal. No respiratory distress.  Abdominal: Soft. Bowel sounds are normal. She exhibits no distension. There is no tenderness.  Musculoskeletal: She exhibits no edema.  Lymphadenopathy:    She has no cervical adenopathy.  Neurological: She is alert and oriented to person, place, and time.  NO FOCAL DEFICITS  Psychiatric: She has a normal mood and affect.  Vitals reviewed.         Assessment & Plan:

## 2016-05-30 NOTE — Progress Notes (Signed)
CC'ED TO PCP 

## 2016-05-30 NOTE — Assessment & Plan Note (Addendum)
SYMPTOMS FAIRLY WELL CONTROLLED. OCCASIONAL LOOSE STOOLS. REMAINS ON PAXIL/PPI. STOPPED USING ASA/NSAIDS.  COMPLETE Entocort 9 mg for two mos then 6 mg for 2 mos then 3 mg for 2 mos. CONTINUE PROBIOTIC DAILY. PLEASE CALL WITH QUESTIONS OR CONCERNS. CONTINUE TO MONITOR SYMPTOMS. FOLLOW UP IN 4 MOS.

## 2016-05-30 NOTE — Patient Instructions (Addendum)
COMPLETE Entocort 9 mg for two mos then 6 mg for 2 mos then 3 mg for 2 mos. CALL IN TWO MOS FOR NEW ENTOCORT PRESCRIPTION.  CONTINUE PROBIOTIC DAILY.  PLEASE CALL WITH QUESTIONS OR CONCERNS.  FOLLOW UP IN 4 MOS.

## 2016-06-12 ENCOUNTER — Encounter: Payer: Self-pay | Admitting: Gastroenterology

## 2016-06-12 MED ORDER — BUDESONIDE 3 MG PO CPEP: 9.0000 mg | ORAL_CAPSULE | Freq: Every day | ORAL | Status: DC

## 2016-06-12 NOTE — Telephone Encounter (Signed)
Pt need refill on Entocort.

## 2016-06-14 ENCOUNTER — Ambulatory Visit (INDEPENDENT_AMBULATORY_CARE_PROVIDER_SITE_OTHER): Payer: Managed Care, Other (non HMO) | Admitting: Psychiatry

## 2016-06-14 ENCOUNTER — Encounter (HOSPITAL_COMMUNITY): Payer: Self-pay | Admitting: Psychiatry

## 2016-06-14 VITALS — BP 125/72 | HR 82 | Ht 64.0 in | Wt 178.4 lb

## 2016-06-14 DIAGNOSIS — F322 Major depressive disorder, single episode, severe without psychotic features: Secondary | ICD-10-CM

## 2016-06-14 MED ORDER — PAROXETINE HCL 20 MG PO TABS: 20.0000 mg | ORAL_TABLET | Freq: Every day | ORAL | Status: DC

## 2016-06-14 MED ORDER — ALPRAZOLAM 1 MG PO TABS: 1.0000 mg | ORAL_TABLET | Freq: Four times a day (QID) | ORAL | Status: DC
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
## 2016-07-10 ENCOUNTER — Encounter: Payer: Self-pay | Admitting: Cardiology

## 2016-07-17 ENCOUNTER — Encounter: Payer: Self-pay | Admitting: Gastroenterology

## 2016-07-18 ENCOUNTER — Telehealth: Payer: Self-pay | Admitting: Gastroenterology

## 2016-07-18 NOTE — Telephone Encounter (Signed)
Pt has faxed [place] FMLA papers and I have laid them in [facility] office chair.

## 2016-07-27 NOTE — Telephone Encounter (Addendum)
FAXED COMPLETED FMLA PAPERWORK TO [phone].

## 2016-07-31 ENCOUNTER — Encounter: Payer: Self-pay | Admitting: Cardiology

## 2016-08-02 ENCOUNTER — Encounter (HOSPITAL_COMMUNITY): Payer: Self-pay | Admitting: Psychiatry

## 2016-08-02 ENCOUNTER — Ambulatory Visit (INDEPENDENT_AMBULATORY_CARE_PROVIDER_SITE_OTHER): Payer: Managed Care, Other (non HMO) | Admitting: Psychiatry

## 2016-08-02 VITALS — BP 128/90 | HR 82 | Ht 64.0 in | Wt 181.0 lb

## 2016-08-02 DIAGNOSIS — F322 Major depressive disorder, single episode, severe without psychotic features: Secondary | ICD-10-CM

## 2016-08-02 MED ORDER — PAROXETINE HCL 20 MG PO TABS: 30.0000 mg | ORAL_TABLET | Freq: Every day | ORAL | 2 refills | Status: DC
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
## 2016-08-15 ENCOUNTER — Encounter: Payer: Self-pay | Admitting: Gastroenterology

## 2016-08-27 ENCOUNTER — Ambulatory Visit (HOSPITAL_COMMUNITY): Payer: Self-pay | Admitting: Psychiatry

## 2016-08-29 ENCOUNTER — Ambulatory Visit: Payer: Self-pay | Admitting: Cardiology

## 2016-09-14 ENCOUNTER — Encounter (HOSPITAL_COMMUNITY): Payer: Self-pay | Admitting: Psychiatry

## 2016-09-14 ENCOUNTER — Ambulatory Visit (INDEPENDENT_AMBULATORY_CARE_PROVIDER_SITE_OTHER): Payer: Managed Care, Other (non HMO) | Admitting: Psychiatry

## 2016-09-14 VITALS — BP 123/76 | HR 69 | Ht 64.0 in | Wt 165.0 lb

## 2016-09-14 DIAGNOSIS — F322 Major depressive disorder, single episode, severe without psychotic features: Secondary | ICD-10-CM | POA: Diagnosis not present

## 2016-09-14 MED ORDER — ALPRAZOLAM 1 MG PO TABS: 1.0000 mg | ORAL_TABLET | Freq: Four times a day (QID) | ORAL | 2 refills | Status: DC
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
## 2016-09-16 NOTE — Progress Notes (Signed)
Cardiology Office Note  Date: 09/17/2016   ID: Rachel Sutton, DOB [birth year], MRN [id number]  PCP: Alonza Bogus, MD  Primary Cardiologist: Rozann Lesches, MD   Chief Complaint  Patient presents with  . Mitral valve disease    History of Present Illness: Rachel Sutton is a 55 y.o. female last seen in March. She presents for a routine follow-up visit. She is not reporting any progressive palpitations or syncope. She has had some vague left subcostal discomfort occasionally. No unusual shortness of breath or cough.  I reviewed her last echocardiogram from 2016, outlined below. She has not had any major change in examination over the last few years.  Medications remain the same as outlined below. She continues to tolerate Toprol-XL 50 mg twice daily for control of palpitations.  I reviewed her ECG today which shows sinus rhythm with poor R-wave progression.  Past Medical History:  Diagnosis Date  . Anxiety   . Arthritis   . Breast cancer ([place]) 02/22/2012   Right T1c, N0  . C. difficile colitis   . Colitis, collagenous 09/22/2015  . Depression   . Essential hypertension, benign   . Fuchs' corneal dystrophy   . GERD (gastroesophageal reflux disease)   . Mitral valve prolapse    Mild mitral regurgitation  . Mixed hyperlipidemia   . Seizures ([place])    Only with dose of haldol; no more since that was stopped.    Current Outpatient Prescriptions  Medication Sig Dispense Refill  . ALPRAZolam (XANAX) 1 MG tablet Take 1 tablet (1 mg total) by mouth 4 (four) times daily. 120 tablet 2  . budesonide (ENTOCORT EC) 3 MG 24 hr capsule Take 3 capsules (9 mg total) by mouth daily. 180 capsule 4  . Calcium-Magnesium-Vitamin D (CALCIUM 500 PO) Take 1 tablet by mouth 2 (two) times daily.    Marland Kitchen EPINEPHrine (EPI-PEN) 0.3 mg/0.3 mL DEVI Inject 0.3 mg into the muscle as needed. For bee venom    . fish oil-omega-3 fatty acids 1000 MG capsule Take 2 g by mouth every morning.    .  hydrochlorothiazide (MICROZIDE) 12.5 MG capsule TAKE 1 CAPSULE BY MOUTH EVERY DAY 90 capsule 3  . metoprolol succinate (TOPROL-XL) 50 MG 24 hr tablet Take 1 tablet (50 mg total) by mouth 2 (two) times daily. Take with or immediately following a meal. 180 tablet 3  . Multiple Vitamin (MULITIVITAMIN WITH MINERALS) TABS Take 1 tablet by mouth every morning.    Marland Kitchen NIFEdipine (PROCARDIA XL/ADALAT-CC) 90 MG 24 hr tablet Take 1 tablet (90 mg total) by mouth daily. 90 tablet 3  . nitroGLYCERIN (NITROSTAT) 0.4 MG SL tablet Place 1 tablet (0.4 mg total) under the tongue every 5 (five) minutes x 3 doses as needed for chest pain. 25 tablet 3  . PARoxetine (PAXIL) 20 MG tablet Take 1.5 tablets (30 mg total) by mouth daily. 135 tablet 2  . Probiotic Product (PHILLIPS COLON HEALTH PO) Take 1 capsule by mouth daily.    . simvastatin (ZOCOR) 40 MG tablet Take 40 mg by mouth every morning.     . sodium chloride (MURO 128) 5 % ophthalmic solution Place 1 drop into both eyes as needed for irritation.     . traMADol (ULTRAM) 50 MG tablet Take 50 mg by mouth every 6 (six) hours as needed for moderate pain.      No current facility-administered medications for this visit.    Allergies:  Bee venom; Haloperidol lactate; Meperidine hcl; and Xifaxan [  rifaximin]   Social History: The patient  reports that she has been smoking Cigarettes.  She started smoking about 42 years ago. She has a 17.50 pack-year smoking history. She has never used smokeless tobacco. She reports that she drinks alcohol. She reports that she does not use drugs.   ROS:  Please see the history of present illness. Otherwise, complete review of systems is positive for intermittent problems with colitis - follows with Dr. Oneida Alar.  All other systems are reviewed and negative.   Physical Exam: VS:  BP 118/78   Pulse 68   Ht 5\' 4"  (1.626 m)   Wt 184 lb (83.5 kg)   SpO2 97%   BMI 31.58 kg/m , BMI Body mass index is 31.58 kg/m.  Wt Readings from Last 3  Encounters:  09/17/16 184 lb (83.5 kg)  05/30/16 173 lb 9.6 oz (78.7 kg)  04/05/16 174 lb (78.9 kg)    Appears comfortable at rest. HEENT: Conjunctiva and lids normal, oropharynx clear  Neck: Supple, no elevated JVP or carotid bruits, no thyromegaly.  Lungs: Clear to auscultation, nonlabored breathing at rest.  Cardiac: Regular rate and rhythm, no S3 or significant systolic murmur, no pericardial rub.  Abdomen: Soft, nontender, bowel sounds present.  Extremities: No pitting edema, distal pulses 2+.  ECG: I personally reviewed the tracing from 03/01/2016 which showed sinus rhythm with decreased R wave progression.  Recent Labwork: 09/22/2015: TSH 0.706 04/03/2016: BUN 15; Creatinine, Ser 0.70; Hemoglobin 12.6; Platelets 389; Potassium 4.4; Sodium 140   Other Studies Reviewed Today:  Echocardiogram 02/03/2015: Study Conclusions  - Left ventricle: The cavity size was normal. Wall thickness was normal. Systolic function was normal. The estimated ejection fraction was in the range of 60% to 65%. Wall motion was normal; there were no regional wall motion abnormalities. There was an increased relative contribution of atrial contraction to ventricular filling. - Mitral valve: Mildly calcified annulus. Mildly thickened leaflets . Mild posterior leaflet prolapse. There was mild regurgitation. - Left atrium: The atrium was mildly dilated. Volume/bsa, S: 29.3 ml/m^2.  Assessment and Plan:  1. Mitral valve prolapse with mild mitral regurgitation by echocardiogram in 2016. No change on examination or degree of palpitations. We will continue with observation and current beta blocker dose. I reviewed her ECG today as well.  2. Essential hypertension, blood pressure is well controlled today.  Current medicines were reviewed with the patient today.   Orders Placed This Encounter  Procedures  . EKG 12-Lead    Disposition: Follow-up with me in 6 months.  Signed, Satira Sark, MD, [facility] 09/17/2016 2:17 PM    [facility] [address] [place], [place] [address] Phone: [phone]; Fax: [phone]

## 2016-09-17 ENCOUNTER — Ambulatory Visit (INDEPENDENT_AMBULATORY_CARE_PROVIDER_SITE_OTHER): Payer: Managed Care, Other (non HMO) | Admitting: Cardiology

## 2016-09-17 ENCOUNTER — Encounter: Payer: Self-pay | Admitting: Cardiology

## 2016-09-17 VITALS — BP 118/78 | HR 68 | Ht 64.0 in | Wt 184.0 lb

## 2016-09-17 DIAGNOSIS — I10 Essential (primary) hypertension: Secondary | ICD-10-CM | POA: Diagnosis not present

## 2016-09-17 DIAGNOSIS — R002 Palpitations: Secondary | ICD-10-CM | POA: Diagnosis not present

## 2016-09-17 DIAGNOSIS — I341 Nonrheumatic mitral (valve) prolapse: Secondary | ICD-10-CM

## 2016-09-17 NOTE — Patient Instructions (Signed)
Your physician wants you to follow-up in: 6 months Dr McDowell You will receive a reminder letter in the mail two months in advance. If you don't receive a letter, please call our office to schedule the follow-up appointment.  Your physician recommends that you continue on your current medications as directed. Please refer to the Current Medication list given to you today.    If you need a refill on your cardiac medications before your next appointment, please call your pharmacy.       Thank you for choosing [facility] !         

## 2016-09-25 ENCOUNTER — Encounter: Payer: Self-pay | Admitting: Gastroenterology

## 2016-10-18 ENCOUNTER — Ambulatory Visit (INDEPENDENT_AMBULATORY_CARE_PROVIDER_SITE_OTHER): Payer: Managed Care, Other (non HMO) | Admitting: Gastroenterology

## 2016-10-18 ENCOUNTER — Encounter: Payer: Self-pay | Admitting: Gastroenterology

## 2016-10-18 ENCOUNTER — Ambulatory Visit: Payer: Self-pay | Admitting: Gastroenterology

## 2016-10-18 DIAGNOSIS — K219 Gastro-esophageal reflux disease without esophagitis: Secondary | ICD-10-CM

## 2016-10-18 DIAGNOSIS — K52831 Collagenous colitis: Secondary | ICD-10-CM | POA: Diagnosis not present

## 2016-10-18 NOTE — Progress Notes (Signed)
cc'ed to pcp °

## 2016-10-18 NOTE — Progress Notes (Signed)
ON RECALL  °

## 2016-10-18 NOTE — Patient Instructions (Addendum)
CONTINUE ENTOCORT. REDUCE TO ONE PILL A DAY ON DEC 1.  DRINK WATER TO KEEP YOUR URINE LIGHT YELLOW.  CONTINUE YOUR WEIGHT LOSS EFFORTS. LOSE TEN POUNDS.  FOLLOW A HIGH FIBER DIET. AVOID ITEMS THAT CAUSE BLOATING & GAS.  CUT OUT SODA.   STOP SMOKING.  AVOID CAFFEINE.  PLEASE CALL WITH QUESTIONS OR CONCERNS.  FOLLOW UP IN 2 MOS.

## 2016-10-18 NOTE — Progress Notes (Signed)
Subjective:    Patient ID: Rachel Sutton, female    DOB: 08/15/1961, 55 y.o.   MRN: [id number]  Alonza Bogus, MD   HPI Talked to mental health specialist and she THERE IS NO ALTERNATIVE. PT FEELS BETTER ON PAXIL. HER PSYCH AND SHE ARE NOT READY TO DO WITHOUT IT. HAS TRIED TO TAPER IN THE PAST. STARTED TWO PILLS ENTOCORT 10 DAYS AGO. BMs: 3X/DAY(#4-5). HEARTBURN: MILD-2-3X/WEEK. TRIGGERS: SMOKING, RARE SODA, CAFFEINE: DAILY. GAINED 14 LBS SINCE APR 2017. GETS BLOATED WHEN SHE EATS. RARE ABDOMINAL PAIN WITH BLOATING BUT NOTHING LIKE IT USED TO BE. NEEDS EYE SURGERY BUT WAITING FOR FUCHS DYSTROPHY TO IMPROVE.  PT DENIES FEVER, CHILLS, HEMATOCHEZIA, HEMATEMESIS, nausea, vomiting, melena, diarrhea, CHEST PAIN, SHORTNESS OF BREATH,  CHANGE IN BOWEL IN HABITS, constipation, abdominal pain, OR problems swallowing.  Past Medical History:  Diagnosis Date  . Anxiety   . Arthritis   . Breast cancer ([place]) 02/22/2012   Right T1c, N0  . C. difficile colitis   . Colitis, collagenous 09/22/2015  . Depression   . Essential hypertension, benign   . Fuchs' corneal dystrophy   . GERD (gastroesophageal reflux disease)   . Mitral valve prolapse    Mild mitral regurgitation  . Mixed hyperlipidemia   . Seizures ([place])    Only with dose of haldol; no more since that was stopped.    Past Surgical History:  Procedure Laterality Date  . ABDOMINAL HYSTERECTOMY  1999  . BIOPSY N/A 09/28/2015   Procedure: BIOPSY;  Surgeon: Danie Binder, MD;  Location: [facility];  Service: Endoscopy;  Laterality: N/A;  . BIOPSY  04/05/2016   Procedure: BIOPSY;  Surgeon: Danie Binder, MD;  Location: [facility];  Service: Endoscopy;;  colon bx  . BREAST LUMPECTOMY  10/30/2002   right  . Carpal tunnel R/L  1992  . Cervical spine fusion X2 since last visit    . COLONOSCOPY WITH PROPOFOL N/A 09/28/2015   Procedure: COLONOSCOPY WITH PROPOFOL;  Surgeon: Danie Binder, MD;  Location: [facility];  Service: Endoscopy;   Laterality: N/A;  procedure 1 cecum time in 1232  time out 1246   total time 14 mintes  . COLONOSCOPY WITH PROPOFOL N/A 04/05/2016   Procedure: COLONOSCOPY WITH PROPOFOL;  Surgeon: Danie Binder, MD;  [facility]: [facility];  Service: Endoscopy;  Laterality: N/A;  1200  . ELBOW SURGERY Right  tennis elbow release 2012  . ESOPHAGOGASTRODUODENOSCOPY  10/26/2012   Procedure: ESOPHAGOGASTRODUODENOSCOPY (EGD);  Surgeon: Inda Castle, MD;  Location: [facility];  Service: Endoscopy;  Laterality: N/A;  . ESOPHAGOGASTRODUODENOSCOPY (EGD) WITH PROPOFOL N/A 09/28/2015   Procedure: ESOPHAGOGASTRODUODENOSCOPY (EGD) WITH PROPOFOL;  Surgeon: Danie Binder, MD;  Location: [facility];  Service: Endoscopy;  Laterality: N/A;  . KNEE ARTHROSCOPY Left   . LEFT HEART CATHETERIZATION WITH CORONARY ANGIOGRAM N/A 10/23/2012   Procedure: LEFT HEART CATHETERIZATION WITH CORONARY ANGIOGRAM;  Surgeon: Peter M Martinique, MD;  Location[facility];  Service: Cardiovascular;  Laterality: N/A;  . OVARIAN CYST REMOVAL     prior to hysterectomy, right  . Ray cage fusion  1997  . SENTINEL LYMPH NODE BIOPSY  10/30/2002   right    Allergies  Allergen Reactions  . Bee Venom Anaphylaxis  . Haloperidol Lactate Other (See Comments)    Convulsions   . Meperidine Hcl Hives  . Xifaxan [Rifaximin] Other (See Comments)    Multiple complaints     Current Outpatient Prescriptions  Medication Sig Dispense  Refill  . ALPRAZolam (XANAX) 1 MG tablet Take 1 tablet (1 mg total) by mouth 4 (four) times daily.    . budesonide (ENTOCORT EC) 3 MG 24 hr capsule Take 3 capsules (9 mg total) by mouth daily.    . Calcium-Magnesium-Vitamin D (CALCIUM 500 PO) Take 1 tablet by mouth 2 (two) times daily.    Marland Kitchen EPINEPHrine (EPI-PEN) 0.3 mg/0.3 mL DEVI Inject 0.3 mg into the muscle as needed. For bee venom    . fish oil-omega-3 fatty acids 1000 MG capsule Take 2 g by mouth every morning.    . hydrochlorothiazide (MICROZIDE) 12.5 MG capsule TAKE 1  CAPSULE BY MOUTH EVERY DAY    . metoprolol succinate (TOPROL-XL) 50 MG 24 hr tablet Take 1 tablet (50 mg total) by mouth 2 (two) times daily. Take with or immediately following a meal.    . Multiple Vitamin (MULITIVITAMIN WITH MINERALS) TABS Take 1 tablet by mouth every morning.    Marland Kitchen NIFEdipine (PROCARDIA XL/ADALAT-CC) 90 MG 24 hr tablet Take 1 tablet (90 mg total) by mouth daily.    . nitroGLYCERIN (NITROSTAT) 0.4 MG SL tablet Place 1 tablet (0.4 mg total) under the tongue every 5 (five) minutes x 3 doses as needed for chest pain.    Marland Kitchen PARoxetine (PAXIL) 20 MG tablet Take 1.5 tablets (30 mg total) by mouth daily.    . Probiotic Product (PHILLIPS COLON HEALTH PO) Take 1 capsule by mouth daily.    . simvastatin (ZOCOR) 40 MG tablet Take 40 mg by mouth every morning.     . sodium chloride (MURO 128) 5 % ophthalmic solution Place 1 drop into both eyes as needed for irritation.     . traMADol (ULTRAM) 50 MG tablet Take 50 mg by mouth every 6 (six) hours as needed for moderate pain.      Review of Systems PER HPI OTHERWISE ALL SYSTEMS ARE NEGATIVE.    Objective:   Physical Exam  Constitutional: She is oriented to person, place, and time. She appears well-developed and well-nourished. No distress.  HENT:  Head: Normocephalic and atraumatic.  Mouth/Throat: Oropharynx is clear and moist. No oropharyngeal exudate.  Eyes: Pupils are equal, round, and reactive to light. No scleral icterus.  Neck: Normal range of motion. Neck supple.  Cardiovascular: Normal rate, regular rhythm and normal heart sounds.   Pulmonary/Chest: Effort normal and breath sounds normal. No respiratory distress.  Abdominal: Soft. Bowel sounds are normal. She exhibits no distension. There is no tenderness.  Musculoskeletal: She exhibits no edema.  Lymphadenopathy:    She has no cervical adenopathy.  Neurological: She is alert and oriented to person, place, and time.  NO  NEW FOCAL DEFICITS  Psychiatric: She has a normal mood  and affect.  Vitals reviewed.     Assessment & Plan:

## 2016-10-18 NOTE — Assessment & Plan Note (Signed)
SYMPTOMS FAIRLY WELL CONTROLLED IN SPITE OF CONSUMING TRIGGERS.  CUT OUT SODA.  STOP SMOKING. AVOID CAFFEINE. LOSE WEIGHT. FOLLOW UP IN 2 MOS.

## 2016-10-18 NOTE — Assessment & Plan Note (Signed)
CLINICALLY IMPROVED. NOW ON ENTOCORT 2 PILLS DAILY.  CONTINUE ENTOCORT. REDUCE TO ONE PILL A DAY ON DEC 1. DRINK WATER TO KEEP YOUR URINE LIGHT YELLOW. FOLLOW A HIGH FIBER DIET. AVOID ITEMS THAT CAUSE BLOATING & GAS. CALL WITH QUESTIONS OR CONCERNS.  FOLLOW UP IN 2 MOS.

## 2016-11-27 ENCOUNTER — Encounter: Payer: Self-pay | Admitting: Gastroenterology

## 2016-11-28 ENCOUNTER — Ambulatory Visit (INDEPENDENT_AMBULATORY_CARE_PROVIDER_SITE_OTHER): Payer: Managed Care, Other (non HMO) | Admitting: Psychiatry

## 2016-11-28 ENCOUNTER — Encounter (HOSPITAL_COMMUNITY): Payer: Self-pay | Admitting: Psychiatry

## 2016-11-28 VITALS — BP 127/68 | HR 86 | Ht 65.0 in | Wt 186.8 lb

## 2016-11-28 DIAGNOSIS — Z79899 Other long term (current) drug therapy: Secondary | ICD-10-CM | POA: Diagnosis not present

## 2016-11-28 DIAGNOSIS — Z888 Allergy status to other drugs, medicaments and biological substances status: Secondary | ICD-10-CM | POA: Diagnosis not present

## 2016-11-28 DIAGNOSIS — Z8249 Family history of ischemic heart disease and other diseases of the circulatory system: Secondary | ICD-10-CM

## 2016-11-28 DIAGNOSIS — F322 Major depressive disorder, single episode, severe without psychotic features: Secondary | ICD-10-CM

## 2016-11-28 DIAGNOSIS — Z833 Family history of diabetes mellitus: Secondary | ICD-10-CM

## 2016-11-28 DIAGNOSIS — Z91030 Bee allergy status: Secondary | ICD-10-CM

## 2016-11-28 DIAGNOSIS — Z801 Family history of malignant neoplasm of trachea, bronchus and lung: Secondary | ICD-10-CM

## 2016-11-28 MED ORDER — PAROXETINE HCL 20 MG PO TABS: 30.0000 mg | ORAL_TABLET | Freq: Every day | ORAL | 2 refills | Status: DC

## 2016-11-28 MED ORDER — ALPRAZOLAM 1 MG PO TABS: 1.0000 mg | ORAL_TABLET | Freq: Three times a day (TID) | ORAL | 2 refills | Status: DC
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
## 2016-12-03 DIAGNOSIS — H2513 Age-related nuclear cataract, bilateral: Secondary | ICD-10-CM | POA: Insufficient documentation

## 2016-12-03 DIAGNOSIS — H18519 Endothelial corneal dystrophy, unspecified eye: Secondary | ICD-10-CM | POA: Insufficient documentation

## 2016-12-17 HISTORY — PX: EYE SURGERY: SHX253

## 2017-01-04 ENCOUNTER — Other Ambulatory Visit: Payer: Self-pay

## 2017-01-04 MED ORDER — HYDROCHLOROTHIAZIDE 12.5 MG PO CAPS: ORAL_CAPSULE | ORAL | 3 refills | Status: DC

## 2017-01-04 NOTE — Telephone Encounter (Signed)
Refill sent to [facility] for hctz

## 2017-01-07 ENCOUNTER — Other Ambulatory Visit: Payer: Self-pay

## 2017-01-07 ENCOUNTER — Telehealth (HOSPITAL_COMMUNITY): Payer: Self-pay | Admitting: *Deleted

## 2017-01-07 MED ORDER — PANTOPRAZOLE SODIUM 40 MG PO TBEC: 40.0000 mg | DELAYED_RELEASE_TABLET | Freq: Every day | ORAL | 6 refills | Status: DC

## 2017-01-07 MED ORDER — METOPROLOL SUCCINATE ER 50 MG PO TB24: 50.0000 mg | ORAL_TABLET | Freq: Two times a day (BID) | ORAL | 3 refills | Status: DC

## 2017-01-07 NOTE — Progress Notes (Unsigned)
Refilled rx requests.

## 2017-01-07 NOTE — Telephone Encounter (Signed)
Pt pharmacy faxed refill request for pt Xanax 1 mg QID. Per pt chart, pt medication was last printed on 11-28-2016 with 90 tabs 2 refills. Called pt pharmacy and spoke with [facility]. Per Larene Beach, pt was the one requesting refills on a script that was written on 06-14-2016 and that script do not have refills. Per Larene Beach, they had on file a script that was written on 09-14-2016 and that script is ready for pick up. Per shannon she do not have written script for 11-28-2016 on file but pt can come by pharmacy to pick up her script that's ready for pick up.

## 2017-01-17 HISTORY — PX: CORNEAL TRANSPLANT: SHX108

## 2017-02-25 ENCOUNTER — Telehealth (HOSPITAL_COMMUNITY): Payer: Self-pay | Admitting: *Deleted

## 2017-02-25 NOTE — Telephone Encounter (Signed)
Called pt to cancel her appt for 02-26-2017 due to weather. lmtcb and number provided.

## 2017-02-26 ENCOUNTER — Ambulatory Visit (HOSPITAL_COMMUNITY): Payer: Self-pay | Admitting: Psychiatry

## 2017-03-07 ENCOUNTER — Ambulatory Visit (INDEPENDENT_AMBULATORY_CARE_PROVIDER_SITE_OTHER): Payer: Commercial Managed Care - PPO | Admitting: Psychiatry

## 2017-03-07 ENCOUNTER — Encounter (HOSPITAL_COMMUNITY): Payer: Self-pay | Admitting: Psychiatry

## 2017-03-07 VITALS — BP 115/68 | HR 80 | Ht 65.0 in | Wt 187.6 lb

## 2017-03-07 DIAGNOSIS — Z818 Family history of other mental and behavioral disorders: Secondary | ICD-10-CM | POA: Diagnosis not present

## 2017-03-07 DIAGNOSIS — Z79899 Other long term (current) drug therapy: Secondary | ICD-10-CM | POA: Diagnosis not present

## 2017-03-07 DIAGNOSIS — F322 Major depressive disorder, single episode, severe without psychotic features: Secondary | ICD-10-CM

## 2017-03-07 MED ORDER — PAROXETINE HCL 20 MG PO TABS: 30.0000 mg | ORAL_TABLET | Freq: Every day | ORAL | 2 refills | Status: DC

## 2017-03-07 MED ORDER — ALPRAZOLAM 1 MG PO TABS: 1.0000 mg | ORAL_TABLET | Freq: Three times a day (TID) | ORAL | 2 refills | Status: DC
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
## 2017-03-12 ENCOUNTER — Other Ambulatory Visit: Payer: Self-pay | Admitting: Cardiology

## 2017-04-03 NOTE — Progress Notes (Signed)
Cardiology Office Note  Date: 04/04/2017   ID: Rachel Sutton, DOB Apr 21, 1961, MRN [id number]  PCP: Alonza Bogus, MD  Primary Cardiologist: Rozann Lesches, MD   Chief Complaint  Patient presents with  . Mitral Valve Prolapse    History of Present Illness: Rachel Sutton is a 56 y.o. female last seen in October 2017. She presents for a routine follow-up visit. Reports no palpitations or exertional dyspnea. Continues to work for [employer]. States that she has some mild ankle edema at the end of the day. No orthopnea or PND. Her weight has gone up since colitis has been stabilized. We did discuss her diet and a walking plan.  Echocardiogram from 2016 showed mitral prolapse with mild mitral regurgitation.  I reviewed her current medications which are outlined below. No changes from a cardiac perspective. She has not seen Dr. Luan Pulling recently for a physical. Blood pressure is well controlled today.  Past Medical History:  Diagnosis Date  . Anxiety   . Arthritis   . Breast cancer ([place]) 02/22/2012   Right T1c, N0  . C. difficile colitis   . Colitis, collagenous 09/22/2015  . Depression   . Essential hypertension, benign   . Fuchs' corneal dystrophy   . GERD (gastroesophageal reflux disease)   . Mitral valve prolapse    Mild mitral regurgitation  . Mixed hyperlipidemia   . Seizures ([place])    Only with dose of haldol; no more since that was stopped.    Past Surgical History:  Procedure Laterality Date  . ABDOMINAL HYSTERECTOMY  1999  . BIOPSY N/A 09/28/2015   Procedure: BIOPSY;  Surgeon: Danie Binder, MD;  Location: [facility];  Service: Endoscopy;  Laterality: N/A;  . BIOPSY  04/05/2016   Procedure: BIOPSY;  Surgeon: Danie Binder, MD;  Location: [facility];  Service: Endoscopy;;  colon bx  . BREAST LUMPECTOMY  10/30/2002   right  . Carpal tunnel R/L  1992  . Cervical spine fusion X2 since last visit    . COLONOSCOPY WITH PROPOFOL N/A 09/28/2015   Procedure:  COLONOSCOPY WITH PROPOFOL;  Surgeon: Danie Binder, MD;  Location: [facility];  Service: Endoscopy;  Laterality: N/A;  procedure 1 cecum time in 1232  time out 1246   total time 14 mintes  . COLONOSCOPY WITH PROPOFOL N/A 04/05/2016   Procedure: COLONOSCOPY WITH PROPOFOL;  Surgeon: Danie Binder, MD;  Location: [facility];  Service: Endoscopy;  Laterality: N/A;  1200  . 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 ELBOW SURGERY Right  tennis elbow release 2012  . ESOPHAGOGASTRODUODENOSCOPY  10/26/2012   Procedure: ESOPHAGOGASTRODUODENOSCOPY (EGD);  Surgeon: Inda Castle, MD;  Location: [facility];  Service: Endoscopy;  Laterality: N/A;  . ESOPHAGOGASTRODUODENOSCOPY (EGD) WITH PROPOFOL N/A 09/28/2015   Procedure: ESOPHAGOGASTRODUODENOSCOPY (EGD) WITH PROPOFOL;  Surgeon: Danie Binder, MD;  Location: [facility];  Service: Endoscopy;  Laterality: N/A;  . KNEE ARTHROSCOPY Left   . LEFT HEART CATHETERIZATION WITH CORONARY ANGIOGRAM N/A 10/23/2012   Procedure: LEFT HEART CATHETERIZATION WITH CORONARY ANGIOGRAM;  Surgeon: Peter M Martinique, MD;  Location: [facility] CATH LAB;  Service: Cardiovascular;  Laterality: N/A;  . OVARIAN CYST REMOVAL     prior to hysterectomy, right  . Ray cage fusion  1997  . SENTINEL LYMPH NODE BIOPSY  10/30/2002   right    Current Outpatient Prescriptions  Medication Sig Dispense Refill  . ALPRAZolam (XANAX) 1 MG tablet Take 1 tablet (1  mg total) by mouth 3 (three) times daily. 90 tablet 2  . budesonide (ENTOCORT EC) 3 MG 24 hr capsule Take 3 capsules (9 mg total) by mouth daily. (Patient taking differently: Take 3 mg by mouth daily. ) 180 capsule 4  . Calcium-Magnesium-Vitamin D (CALCIUM 500 PO) Take 1 tablet by mouth 2 (two) times daily.    Marland Kitchen EPINEPHrine (EPI-PEN) 0.3 mg/0.3 mL DEVI Inject 0.3 mg into the muscle as needed. For bee venom    . fish oil-omega-3 fatty acids 1000 MG capsule Take 2 g by mouth every morning.    . hydrochlorothiazide (MICROZIDE) 12.5 MG capsule TAKE 1  CAPSULE BY MOUTH EVERY DAY 90 capsule 3  . metoprolol succinate (TOPROL-XL) 50 MG 24 hr tablet Take 1 tablet (50 mg total) by mouth 2 (two) times daily. Take with or immediately following a meal. 180 tablet 3  . Multiple Vitamin (MULITIVITAMIN WITH MINERALS) TABS Take 1 tablet by mouth every morning.    Marland Kitchen NIFEdipine (PROCARDIA XL/ADALAT-CC) 90 MG 24 hr tablet TAKE 1 TABLET DAILY 90 tablet 3  . nitroGLYCERIN (NITROSTAT) 0.4 MG SL tablet Place 1 tablet (0.4 mg total) under the tongue every 5 (five) minutes x 3 doses as needed for chest pain. 25 tablet 3  . pantoprazole (PROTONIX) 40 MG tablet Take 1 tablet (40 mg total) by mouth daily. 30 tablet 6  . PARoxetine (PAXIL) 20 MG tablet Take 1.5 tablets (30 mg total) by mouth daily. 135 tablet 2  . Probiotic Product (PHILLIPS COLON HEALTH PO) Take 1 capsule by mouth daily.    . simvastatin (ZOCOR) 40 MG tablet Take 40 mg by mouth every morning.     . sodium chloride (MURO 128) 5 % ophthalmic solution Place 1 drop into both eyes as needed for irritation.     . traMADol (ULTRAM) 50 MG tablet Take 50 mg by mouth every 6 (six) hours as needed for moderate pain.      No current facility-administered medications for this visit.    Allergies:  Bee venom; Haloperidol lactate; Meperidine hcl; and Xifaxan [rifaximin]   Social History: The patient  reports that she has been smoking Cigarettes.  She started smoking about 42 years ago. She has a 17.50 pack-year smoking history. She has never used smokeless tobacco. She reports that she drinks alcohol. She reports that she does not use drugs.   ROS:  Please see the history of present illness. Otherwise, complete review of systems is positive for she has undergone interval left eye surgery.  All other systems are reviewed and negative.   Physical Exam: VS:  BP 122/84   Pulse 92   Ht 5\' 4"  (1.626 m)   Wt 187 lb (84.8 kg)   SpO2 96%   BMI 32.10 kg/m , BMI Body mass index is 32.1 kg/m.  Wt Readings from Last 3  Encounters:  04/04/17 187 lb (84.8 kg)  10/18/16 186 lb 9.6 oz (84.6 kg)  09/17/16 184 lb (83.5 kg)    Appears comfortable at rest. HEENT: Conjunctiva and lids normal, oropharynx clear  Neck: Supple, no elevated JVP or carotid bruits, no thyromegaly.  Lungs: Clear to auscultation, nonlabored breathing at rest.  Cardiac: Regular rate and rhythm, no S3 or significant systolic murmur, no pericardial rub.  Abdomen: Soft, nontender, bowel sounds present.  Extremities: No pitting edema, distal pulses 2+. Skin: Warm and dry. Musculoskeletal: No kyphosis. Neuropsychiatric: Alert and oriented 3, affect appropriate.  ECG: I personally reviewed the tracing from 09/17/2016 which showed  sinus rhythm with low voltage and poor R-wave progression.  Recent Labwork:  April 2017: Hemoglobin 12.6, platelets 389, potassium 4.1, BUN 25, creatinine 0.78  Other Studies Reviewed Today:  Echocardiogram 02/03/2015: Study Conclusions  - Left ventricle: The cavity size was normal. Wall thickness was normal. Systolic function was normal. The estimated ejection fraction was in the range of 60% to 65%. Wall motion was normal; there were no regional wall motion abnormalities. There was an increased relative contribution of atrial contraction to ventricular filling. - Mitral valve: Mildly calcified annulus. Mildly thickened leaflets . Mild posterior leaflet prolapse. There was mild regurgitation. - Left atrium: The atrium was mildly dilated. Volume/bsa, S: 29.3 ml/m^2.  Assessment and Plan:  1. Mitral valve prolapse, mild involving the posterior leaflet associated with mild mitral regurgitation by echo [place] in 2016. No change on examination. No increasing symptoms. Continue observation.  2. Essential hypertension. Currently on HCTZ, Toprol-XL, and nifedipine. Blood pressure well controlled today. No changes were made.  3. Hyperlipidemia, continues on Zocor. I recommended that she  follow-up Dr. Luan Pulling for a physical with lab work.  4. Tobacco abuse. Smoking cessation has been discussed.  Current medicines were reviewed with the patient today.  Disposition: Follow-up in 6 months.  Signed, Satira Sark, MD, [facility] 04/04/2017 8:29 AM    [facility] at [facility] [address] [place], [place] [address] Phone: [phone]; Fax: [phone]

## 2017-04-04 ENCOUNTER — Encounter: Payer: Self-pay | Admitting: Cardiology

## 2017-04-04 ENCOUNTER — Ambulatory Visit (INDEPENDENT_AMBULATORY_CARE_PROVIDER_SITE_OTHER): Payer: Self-pay | Admitting: Cardiology

## 2017-04-04 VITALS — BP 122/84 | HR 92 | Ht 64.0 in | Wt 187.0 lb

## 2017-04-04 DIAGNOSIS — Z720 Tobacco use: Secondary | ICD-10-CM

## 2017-04-04 DIAGNOSIS — E782 Mixed hyperlipidemia: Secondary | ICD-10-CM

## 2017-04-04 DIAGNOSIS — I341 Nonrheumatic mitral (valve) prolapse: Secondary | ICD-10-CM

## 2017-04-04 DIAGNOSIS — I10 Essential (primary) hypertension: Secondary | ICD-10-CM

## 2017-04-04 NOTE — Patient Instructions (Signed)
Your physician wants you to follow-up in: 6 months You will receive a reminder letter in the mail two months in advance. If you don't receive a letter, please call our office to schedule the follow-up appointment.    Your physician recommends that you continue on your current medications as directed. Please refer to the Current Medication list given to you today.    If you need a refill on your cardiac medications before your next appointment, please call your pharmacy.     Thank you for choosing [facility] !        

## 2017-05-22 ENCOUNTER — Other Ambulatory Visit (HOSPITAL_COMMUNITY): Payer: Self-pay | Admitting: Pulmonary Disease

## 2017-05-22 ENCOUNTER — Ambulatory Visit (HOSPITAL_COMMUNITY)
Admission: RE | Admit: 2017-05-22 | Discharge: 2017-05-22 | Disposition: A | Discharge status: Home / Self Care | Payer: Commercial Managed Care - PPO | Source: Ambulatory Visit | Attending: Pulmonary Disease | Admitting: Pulmonary Disease

## 2017-05-22 DIAGNOSIS — M19072 Primary osteoarthritis, left ankle and foot: Secondary | ICD-10-CM | POA: Diagnosis not present

## 2017-05-22 DIAGNOSIS — M25561 Pain in right knee: Secondary | ICD-10-CM

## 2017-05-22 DIAGNOSIS — M19041 Primary osteoarthritis, right hand: Secondary | ICD-10-CM | POA: Diagnosis not present

## 2017-05-22 DIAGNOSIS — M25462 Effusion, left knee: Secondary | ICD-10-CM | POA: Insufficient documentation

## 2017-05-22 DIAGNOSIS — M25562 Pain in left knee: Secondary | ICD-10-CM

## 2017-05-22 DIAGNOSIS — M79641 Pain in right hand: Secondary | ICD-10-CM

## 2017-05-22 DIAGNOSIS — M79642 Pain in left hand: Secondary | ICD-10-CM

## 2017-05-22 DIAGNOSIS — M19042 Primary osteoarthritis, left hand: Secondary | ICD-10-CM | POA: Diagnosis not present

## 2017-06-03 ENCOUNTER — Encounter (HOSPITAL_COMMUNITY): Payer: Self-pay | Admitting: Psychiatry

## 2017-06-03 ENCOUNTER — Ambulatory Visit (INDEPENDENT_AMBULATORY_CARE_PROVIDER_SITE_OTHER): Payer: Commercial Managed Care - PPO | Admitting: Psychiatry

## 2017-06-03 VITALS — BP 116/82 | HR 81 | Ht 64.0 in | Wt 183.0 lb

## 2017-06-03 DIAGNOSIS — F419 Anxiety disorder, unspecified: Secondary | ICD-10-CM

## 2017-06-03 DIAGNOSIS — F322 Major depressive disorder, single episode, severe without psychotic features: Secondary | ICD-10-CM

## 2017-06-03 DIAGNOSIS — Z79899 Other long term (current) drug therapy: Secondary | ICD-10-CM

## 2017-06-03 DIAGNOSIS — F329 Major depressive disorder, single episode, unspecified: Secondary | ICD-10-CM | POA: Diagnosis not present

## 2017-06-03 DIAGNOSIS — Z888 Allergy status to other drugs, medicaments and biological substances status: Secondary | ICD-10-CM | POA: Diagnosis not present

## 2017-06-03 DIAGNOSIS — F17200 Nicotine dependence, unspecified, uncomplicated: Secondary | ICD-10-CM | POA: Diagnosis not present

## 2017-06-03 MED ORDER — ALPRAZOLAM 1 MG PO TABS: 1.0000 mg | ORAL_TABLET | Freq: Two times a day (BID) | ORAL | 2 refills | Status: DC

## 2017-06-03 MED ORDER — PAROXETINE HCL 20 MG PO TABS: 30.0000 mg | ORAL_TABLET | Freq: Every day | ORAL | 2 refills | Status: DC
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
## 2017-06-24 ENCOUNTER — Encounter: Payer: Self-pay | Admitting: Gastroenterology

## 2017-08-14 ENCOUNTER — Other Ambulatory Visit: Payer: Self-pay | Admitting: Cardiology

## 2017-09-03 ENCOUNTER — Encounter (HOSPITAL_COMMUNITY): Payer: Self-pay | Admitting: Psychiatry

## 2017-09-03 ENCOUNTER — Ambulatory Visit (INDEPENDENT_AMBULATORY_CARE_PROVIDER_SITE_OTHER): Payer: Commercial Managed Care - PPO | Admitting: Psychiatry

## 2017-09-03 VITALS — BP 121/71 | HR 80 | Ht 64.0 in | Wt 185.0 lb

## 2017-09-03 DIAGNOSIS — F17200 Nicotine dependence, unspecified, uncomplicated: Secondary | ICD-10-CM

## 2017-09-03 DIAGNOSIS — G479 Sleep disorder, unspecified: Secondary | ICD-10-CM

## 2017-09-03 DIAGNOSIS — F322 Major depressive disorder, single episode, severe without psychotic features: Secondary | ICD-10-CM

## 2017-09-03 DIAGNOSIS — Z79899 Other long term (current) drug therapy: Secondary | ICD-10-CM | POA: Diagnosis not present

## 2017-09-03 DIAGNOSIS — F419 Anxiety disorder, unspecified: Secondary | ICD-10-CM

## 2017-09-03 DIAGNOSIS — F329 Major depressive disorder, single episode, unspecified: Secondary | ICD-10-CM | POA: Diagnosis not present

## 2017-09-03 DIAGNOSIS — Z818 Family history of other mental and behavioral disorders: Secondary | ICD-10-CM

## 2017-09-03 DIAGNOSIS — R5383 Other fatigue: Secondary | ICD-10-CM | POA: Diagnosis not present

## 2017-09-03 MED ORDER — ALPRAZOLAM 1 MG PO TABS: 1.0000 mg | ORAL_TABLET | Freq: Two times a day (BID) | ORAL | 2 refills | Status: DC

## 2017-09-03 MED ORDER — METHYLPHENIDATE HCL 10 MG PO TABS: 10.0000 mg | ORAL_TABLET | Freq: Two times a day (BID) | ORAL | 0 refills | Status: DC

## 2017-09-03 MED ORDER — PAROXETINE HCL 20 MG PO TABS: 30.0000 mg | ORAL_TABLET | Freq: Every day | ORAL | 2 refills | Status: DC
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
## 2017-09-30 ENCOUNTER — Encounter (HOSPITAL_COMMUNITY): Payer: Self-pay | Admitting: Psychiatry

## 2017-09-30 ENCOUNTER — Ambulatory Visit (INDEPENDENT_AMBULATORY_CARE_PROVIDER_SITE_OTHER): Payer: Commercial Managed Care - PPO | Admitting: Psychiatry

## 2017-09-30 VITALS — BP 114/42 | HR 77 | Ht 64.0 in | Wt 181.0 lb

## 2017-09-30 DIAGNOSIS — F419 Anxiety disorder, unspecified: Secondary | ICD-10-CM

## 2017-09-30 DIAGNOSIS — F17200 Nicotine dependence, unspecified, uncomplicated: Secondary | ICD-10-CM | POA: Diagnosis not present

## 2017-09-30 DIAGNOSIS — F322 Major depressive disorder, single episode, severe without psychotic features: Secondary | ICD-10-CM

## 2017-09-30 DIAGNOSIS — G479 Sleep disorder, unspecified: Secondary | ICD-10-CM

## 2017-09-30 DIAGNOSIS — Z79899 Other long term (current) drug therapy: Secondary | ICD-10-CM | POA: Diagnosis not present

## 2017-09-30 DIAGNOSIS — F329 Major depressive disorder, single episode, unspecified: Secondary | ICD-10-CM | POA: Diagnosis not present

## 2017-09-30 MED ORDER — PAROXETINE HCL 20 MG PO TABS: 30.0000 mg | ORAL_TABLET | Freq: Every day | ORAL | 2 refills | Status: DC

## 2017-09-30 MED ORDER — METHYLPHENIDATE HCL 10 MG PO TABS: 10.0000 mg | ORAL_TABLET | Freq: Two times a day (BID) | ORAL | 0 refills | Status: DC
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
## 2017-10-07 NOTE — Progress Notes (Signed)
Cardiology Office Note  Date: 10/08/2017   ID: Rachel Sutton, DOB [birth year], MRN [id number]  PCP: Sinda Du, MD  Primary Cardiologist: Rozann Lesches, MD   Chief Complaint  Patient presents with  . Mitral Valve Prolapse    History of Present Illness: Rachel Sutton is a 56 y.o. female last seen in April. She presents for a routine follow-up visit. Reports no chest pain or progressive palpitations, no dizziness or syncope. Continues to work full-time at [employer].  I reviewed her medications which are outlined below. She continues on Toprol-XL at 50 mg twice daily.  Last echocardiogram was in 2016 as outlined below.  I personally reviewed her ECG today which shows normal sinus rhythm.  Past Medical History:  Diagnosis Date  . Anxiety   . Arthritis   . Breast cancer ([place]) 02/22/2012   Right T1c, N0  . C. difficile colitis   . Colitis, collagenous 09/22/2015  . Depression   . Essential hypertension, benign   . Fuchs' corneal dystrophy   . GERD (gastroesophageal reflux disease)   . Mitral valve prolapse    Mild mitral regurgitation  . Mixed hyperlipidemia   . Seizures ([place])    Only with dose of haldol; no more since that was stopped.    Past Surgical History:  Procedure Laterality Date  . ABDOMINAL HYSTERECTOMY  1999  . BIOPSY N/A 09/28/2015   Procedure: BIOPSY;  Surgeon: Danie Binder, MD;  Location: [facility];  Service: Endoscopy;  Laterality: N/A;  . BIOPSY  04/05/2016   Procedure: BIOPSY;  Surgeon: Danie Binder, MD;  Location: [facility];  Service: Endoscopy;;  colon bx  . BREAST LUMPECTOMY  10/30/2002   right  . Carpal tunnel R/L  1992  . Cervical spine fusion X2 since last visit    . COLONOSCOPY WITH PROPOFOL N/A 09/28/2015   Procedure: COLONOSCOPY WITH PROPOFOL;  Surgeon: Danie Binder, MD;  Location: [facility];  Service: Endoscopy;  Laterality: N/A;  procedure 1 cecum time in 1232  time out 1246   total time 14 mintes  . COLONOSCOPY WITH PROPOFOL  N/A 04/05/2016   Procedure: COLONOSCOPY WITH PROPOFOL;  Surgeon: Danie Binder, MD;  [facility];  Service: Endoscopy;  Laterality: N/A;  1200  . 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 ELBOW SURGERY Right  tennis elbow release 2012  . ESOPHAGOGASTRODUODENOSCOPY  10/26/2012   Procedure: ESOPHAGOGASTRODUODENOSCOPY (EGD);  Surgeon: Inda Castle, MD;  Location: [facility];  Service: Endoscopy;  Laterality: N/A;  . ESOPHAGOGASTRODUODENOSCOPY (EGD) WITH PROPOFOL N/A 09/28/2015   Procedure: ESOPHAGOGASTRODUODENOSCOPY (EGD) WITH PROPOFOL;  Surgeon: Danie Binder, MD;  Location: [facility];  Service: Endoscopy;  Laterality: N/A;  . KNEE ARTHROSCOPY Left   . LEFT HEART CATHETERIZATION WITH CORONARY ANGIOGRAM N/A 10/23/2012   Procedure: LEFT HEART CATHETERIZATION WITH CORONARY ANGIOGRAM;  Surgeon: Peter M Martinique, MD;  Location[facility] C[facility] LAB;  Service: Cardiovascular;  Laterality: N/A;  . OVARIAN CYST REMOVAL     prior to hysterectomy, right  . Ray cage fusion  1997  . SENTINEL LYMPH NODE BIOPSY  10/30/2002   right    Current Outpatient Prescriptions  Medication Sig Dispense Refill  . Abatacept (ORENCIA IV) Inject into the vein.    Marland Kitchen ALPRAZolam (XANAX) 1 MG tablet Take 1 tablet (1 mg total) by mouth 2 (two) times daily. 60 tablet 2  . Calcium-Magnesium-Vitamin D (CALCIUM 500 PO) Take 1 tablet by mouth 2 (two)  times daily.    Marland Kitchen EPINEPHrine (EPI-PEN) 0.3 mg/0.3 mL DEVI Inject 0.3 mg into the muscle as needed. For bee venom    . fish oil-omega-3 fatty acids 1000 MG capsule Take 2 g by mouth every morning.    Marland Kitchen FOLIC ACID PO Take 1 mg by mouth 2 (two) times daily.     . hydrochlorothiazide (MICROZIDE) 12.5 MG capsule TAKE 1 CAPSULE BY MOUTH EVERY DAY 90 capsule 3  . methotrexate 2.5 MG tablet Take by mouth once a week. Taking 0.25mg  6 tabs Weekly    . methylphenidate (RITALIN) 10 MG tablet Take 1 tablet (10 mg total) by mouth 2 (two) times daily with breakfast and lunch. 60 tablet 0  .  methylphenidate (RITALIN) 10 MG tablet Take 1 tablet (10 mg total) by mouth 2 (two) times daily with breakfast and lunch. 60 tablet 0  . metoprolol succinate (TOPROL-XL) 50 MG 24 hr tablet Take 1 tablet (50 mg total) by mouth 2 (two) times daily. Take with or immediately following a meal. 180 tablet 3  . Multiple Vitamin (MULITIVITAMIN WITH MINERALS) TABS Take 1 tablet by mouth every morning.    Marland Kitchen NIFEdipine (PROCARDIA XL/ADALAT-CC) 90 MG 24 hr tablet TAKE 1 TABLET DAILY 90 tablet 3  . nitroGLYCERIN (NITROSTAT) 0.4 MG SL tablet Place 1 tablet (0.4 mg total) under the tongue every 5 (five) minutes x 3 doses as needed for chest pain. 25 tablet 3  . pantoprazole (PROTONIX) 40 MG tablet TAKE 1 TABLET DAILY 30 tablet 6  . PARoxetine (PAXIL) 20 MG tablet Take 1.5 tablets (30 mg total) by mouth daily. 135 tablet 2  . Probiotic Product (PHILLIPS COLON HEALTH PO) Take 1 capsule by mouth daily.    . simvastatin (ZOCOR) 40 MG tablet Take 40 mg by mouth every morning.     . sodium chloride (MURO 128) 5 % ophthalmic solution Place 1 drop into both eyes every 3 (three) hours as needed.      No current facility-administered medications for this visit.    Allergies:  Bee venom; Haloperidol lactate; Meperidine hcl; and Xifaxan [rifaximin]   Social History: The patient  reports that she has been smoking Cigarettes.  She started smoking about 43 years ago. She has a 17.50 pack-year smoking history. She has never used smokeless tobacco. She reports that she drinks alcohol. She reports that she does not use drugs.   ROS:  Please see the history of present illness. Otherwise, complete review of systems is positive for intermittent arthralgias.  All other systems are reviewed and negative.   Physical Exam: VS:  BP 122/84   Pulse 79   Ht 5\' 4"  (1.626 m)   Wt 182 lb (82.6 kg)   SpO2 97%   BMI 31.24 kg/m , BMI Body mass index is 31.24 kg/m.  Wt Readings from Last 3 Encounters:  10/08/17 182 lb (82.6 kg)    04/04/17 187 lb (84.8 kg)  10/18/16 186 lb 9.6 oz (84.6 kg)    General: Patient appears comfortable at rest. HEENT: Conjunctiva and lids normal, oropharynx clear. Neck: Supple, no elevated JVP or carotid bruits, no thyromegaly. Lungs: Clear to auscultation, nonlabored breathing at rest. Cardiac: Regular rate and rhythm, no S3, very soft systolic murmur, no pericardial rub. Abdomen: Soft, nontender, bowel sounds present. Extremities: No pitting edema, distal pulses 2+. Skin: Warm and dry. Musculoskeletal: No kyphosis. Neuropsychiatric: Alert and oriented x3, affect grossly appropriate.  ECG: I personally reviewed the tracing from 09/17/2016 which showed sinus rhythm  with low voltage and poor R-wave progression.   Recent Labwork:  April 2017: BUN 15, creatinine 0.7, potassium 4.4, hemoglobin 12.6, platelets 389  Other Studies Reviewed Today:  Echocardiogram 02/03/2015: Study Conclusions  - Left ventricle: The cavity size was normal. Wall thickness was normal. Systolic function was normal. The estimated ejection fraction was in the range of 60% to 65%. Wall motion was normal; there were no regional wall motion abnormalities. There was an increased relative contribution of atrial contraction to ventricular filling. - Mitral valve: Mildly calcified annulus. Mildly thickened leaflets . Mild posterior leaflet prolapse. There was mild regurgitation. - Left atrium: The atrium was mildly dilated. Volume/bsa, S: 29.3 ml/m^2.  Assessment and Plan:  1. Mitral valve prolapse mainly involving the posterior leaflet with previously documented mild mitral regurgitation. Plan is to obtain a follow-up echocardiogram. Soft systolic murmur on examination. No progression in palpitations on Toprol-XL.  2. Essential hypertension, blood pressure well controlled today. No changes made present regimen. Keep follow-up with Dr. Luan Pulling.  3. Hyperlipidemia on Zocor. Continues to follow with  Dr. Luan Pulling.  4. Tobacco abuse. Smoking cessation discussed over time.  Current medicines were reviewed with the patient today.   Orders Placed This Encounter  Procedures  . EKG 12-Lead  . ECHOCARDIOGRAM COMPLETE    Disposition: Follow-up in 6 months.  Signed, Satira Sark, MD, [facility] 10/08/2017 9:29 AM    [facility] [place]. [address] [place], [place] [address] Phone: [phone]; Fax: [phone]

## 2017-10-08 ENCOUNTER — Encounter: Payer: Self-pay | Admitting: Cardiology

## 2017-10-08 ENCOUNTER — Ambulatory Visit (INDEPENDENT_AMBULATORY_CARE_PROVIDER_SITE_OTHER): Payer: Commercial Managed Care - PPO | Admitting: Cardiology

## 2017-10-08 VITALS — BP 122/84 | HR 79 | Ht 64.0 in | Wt 182.0 lb

## 2017-10-08 DIAGNOSIS — Z720 Tobacco use: Secondary | ICD-10-CM | POA: Diagnosis not present

## 2017-10-08 DIAGNOSIS — I341 Nonrheumatic mitral (valve) prolapse: Secondary | ICD-10-CM

## 2017-10-08 DIAGNOSIS — E782 Mixed hyperlipidemia: Secondary | ICD-10-CM

## 2017-10-08 DIAGNOSIS — I10 Essential (primary) hypertension: Secondary | ICD-10-CM | POA: Diagnosis not present

## 2017-10-08 NOTE — Patient Instructions (Signed)
Your physician wants you to follow-up in: 6 months with Dr McDowell You will receive a reminder letter in the mail two months in advance. If you don't receive a letter, please call our office to schedule the follow-up appointment.   Your physician has requested that you have an echocardiogram. Echocardiography is a painless test that uses sound waves to create images of your heart. It provides your doctor with information about the size and shape of your heart and how well your heart's chambers and valves are working. This procedure takes approximately one hour. There are no restrictions for this procedure.    Your physician recommends that you continue on your current medications as directed. Please refer to the Current Medication list given to you today.    If you need a refill on your cardiac medications before your next appointment, please call your pharmacy.     No lab work ordered today     Thank you for choosing  [facility] !        

## 2017-10-10 ENCOUNTER — Ambulatory Visit (HOSPITAL_COMMUNITY)
Admission: RE | Admit: 2017-10-10 | Discharge: 2017-10-10 | Disposition: A | Discharge status: Home / Self Care | Payer: Commercial Managed Care - PPO | Source: Ambulatory Visit | Attending: Cardiology | Admitting: Cardiology

## 2017-10-10 DIAGNOSIS — C50911 Malignant neoplasm of unspecified site of right female breast: Secondary | ICD-10-CM | POA: Insufficient documentation

## 2017-10-10 DIAGNOSIS — I340 Nonrheumatic mitral (valve) insufficiency: Secondary | ICD-10-CM | POA: Insufficient documentation

## 2017-10-10 DIAGNOSIS — I341 Nonrheumatic mitral (valve) prolapse: Secondary | ICD-10-CM | POA: Insufficient documentation

## 2017-10-10 DIAGNOSIS — Z720 Tobacco use: Secondary | ICD-10-CM | POA: Diagnosis not present

## 2017-10-10 DIAGNOSIS — I10 Essential (primary) hypertension: Secondary | ICD-10-CM | POA: Diagnosis not present

## 2017-10-10 DIAGNOSIS — E785 Hyperlipidemia, unspecified: Secondary | ICD-10-CM | POA: Diagnosis not present

## 2017-10-10 DIAGNOSIS — R002 Palpitations: Secondary | ICD-10-CM | POA: Insufficient documentation

## 2017-10-10 DIAGNOSIS — I059 Rheumatic mitral valve disease, unspecified: Secondary | ICD-10-CM | POA: Diagnosis present

## 2017-10-10 NOTE — Progress Notes (Signed)
*  PRELIMINARY RESULTS* Echocardiogram 2D Echocardiogram has been performed.  Rachel Sutton 10/10/2017, 11:37 AM

## 2017-11-29 ENCOUNTER — Ambulatory Visit (HOSPITAL_COMMUNITY): Payer: Self-pay | Admitting: Psychiatry

## 2017-12-12 ENCOUNTER — Other Ambulatory Visit: Payer: Self-pay | Admitting: Cardiology

## 2017-12-16 ENCOUNTER — Other Ambulatory Visit (HOSPITAL_COMMUNITY): Payer: Self-pay | Admitting: Psychiatry

## 2017-12-24 ENCOUNTER — Encounter (HOSPITAL_COMMUNITY): Payer: Self-pay | Admitting: Psychiatry

## 2017-12-24 ENCOUNTER — Ambulatory Visit (INDEPENDENT_AMBULATORY_CARE_PROVIDER_SITE_OTHER): Payer: Commercial Managed Care - PPO | Admitting: Psychiatry

## 2017-12-24 VITALS — BP 107/72 | HR 76 | Ht 64.0 in | Wt 185.0 lb

## 2017-12-24 DIAGNOSIS — F17210 Nicotine dependence, cigarettes, uncomplicated: Secondary | ICD-10-CM

## 2017-12-24 DIAGNOSIS — M2550 Pain in unspecified joint: Secondary | ICD-10-CM

## 2017-12-24 DIAGNOSIS — Z818 Family history of other mental and behavioral disorders: Secondary | ICD-10-CM

## 2017-12-24 DIAGNOSIS — F419 Anxiety disorder, unspecified: Secondary | ICD-10-CM

## 2017-12-24 DIAGNOSIS — F322 Major depressive disorder, single episode, severe without psychotic features: Secondary | ICD-10-CM

## 2017-12-24 MED ORDER — METHYLPHENIDATE HCL 10 MG PO TABS: 10.0000 mg | ORAL_TABLET | Freq: Two times a day (BID) | ORAL | 0 refills | Status: DC

## 2017-12-24 MED ORDER — ALPRAZOLAM 1 MG PO TABS: 1.0000 mg | ORAL_TABLET | Freq: Two times a day (BID) | ORAL | 2 refills | Status: DC

## 2017-12-24 MED ORDER — PAROXETINE HCL 20 MG PO TABS: 30.0000 mg | ORAL_TABLET | Freq: Every day | ORAL | 2 refills | Status: DC

## 2017-12-24 NOTE — Progress Notes (Signed)
BH MD/PA/NP OP Progress Note  12/24/2017 8:49 AM Rachel Sutton  MRN:  [id number]  Chief Complaint:  Chief Complaint    Depression; Anxiety; Follow-up     HPI: this patient is a 57 year old widowed white female who is currently living alone in [place]. She works as a Engineer, production for [employer]   The patient was referred by her primary physician Dr. Marolyn Haller for further assessment and treatment of anxiety and depression.  The patient states that she does not really have any history of mental illness or treatment. She and her husband moved her parents here a few years ago because their health was declining.she took care of both of them. Her mother died in 2012-03-21 of pancreatic cancer and her father died 65 months later. During this time Dr. Luan Pulling prescribed clonazepam to help with anxiety. In January 2014 her husband was diagnosed with a myocardial infarction congestive heart failure and COPD. His health declined quickly and he died in 08/21/14.  Patient returns after 4 months.  For the most part she is doing very well.  She has had a lot of medical issues and was finally diagnosed with rheumatoid arthritis and is being treated with a new infusion drug.  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  Both the rheumatoid arthritis and the treatment for it causes fatigue and the methylphenidate does seem to help.  She does not take it every day but days that she feels particularly tired.  She still thinks the Paxil has helped her mood but sometime in the near future she would like to try to taper off of it.  The Xanax continues to help her anxiety.  She is sleeping well and is doing very well at her job Visit Diagnosis:    ICD-10-CM   1. Major depressive disorder, single episode, severe without psychotic features ([place]) F32.2     Past Psychiatric History: none  Past Medical History:  Past Medical History:  Diagnosis Date  . Anxiety   . Arthritis    . Breast cancer ([place]) 02/22/2012   Right T1c, N0  . C. difficile colitis   . Colitis, collagenous 09/22/2015  . Depression   . Essential hypertension, benign   . Fuchs' corneal dystrophy   . GERD (gastroesophageal reflux disease)   . Mitral valve prolapse    Mild mitral regurgitation  . Mixed hyperlipidemia   . Seizures ([place])    Only with dose of haldol; no more since that was stopped.    Past Surgical History:  Procedure Laterality Date  . ABDOMINAL HYSTERECTOMY  1999  . BIOPSY N/A 09/28/2015   Procedure: BIOPSY;  Surgeon: Danie Binder, MD;  Location: [facility];  Service: Endoscopy;  Laterality: N/A;  . BIOPSY  04/05/2016   Procedure: BIOPSY;  Surgeon: Danie Binder, MD;  [facility];  Service: Endoscopy;;  colon bx  . BREAST LUMPECTOMY  10/30/2002   right  . Carpal tunnel R/L  1992  . Cervical spine fusion X2 since last visit    . COLONOSCOPY WITH PROPOFOL N/A 09/28/2015   Procedure: COLONOSCOPY WITH PROPOFOL;  Surgeon: Danie Binder, MD;  Location: [facility];  Service: Endoscopy;  Laterality: N/A;  procedure 1 cecum time in 1231/03/22  time out 1246   total time 14 mintes  . COLONOSCOPY WITH PROPOFOL N/A 04/05/2016   Procedure: COLONOSCOPY WITH PROPOFOL;  Surgeon: Danie Binder, MD;  [facility];  Service: Endoscopy;  Laterality: N/A;  1200  . 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 ELBOW SURGERY Right  tennis elbow release 2012  . ESOPHAGOGASTRODUODENOSCOPY  10/26/2012   Procedure: ESOPHAGOGASTRODUODENOSCOPY (EGD);  Surgeon: Inda Castle, MD;  Location: [facility];  Service: Endoscopy;  Laterality: N/A;  . ESOPHAGOGASTRODUODENOSCOPY (EGD) WITH PROPOFOL N/A 09/28/2015   Procedure: ESOPHAGOGASTRODUODENOSCOPY (EGD) WITH PROPOFOL;  Surgeon: Danie Binder, MD;  Location: [facility];  Service: Endoscopy;  Laterality: N/A;  . KNEE ARTHROSCOPY Left   . LEFT HEART CATHETERIZATION WITH CORONARY ANGIOGRAM N/A 10/23/2012   Procedure: LEFT HEART CATHETERIZATION WITH  CORONARY ANGIOGRAM;  Surgeon: Peter M Martinique, MD;  Location: [facility] CATH LAB;  Service: Cardiovascular;  Laterality: N/A;  . OVARIAN CYST REMOVAL     prior to hysterectomy, right  . Ray cage fusion  1997  . SENTINEL LYMPH NODE BIOPSY  10/30/2002   right    Family Psychiatric History: None  Family History:  Family History  Problem Relation Age of Onset  . Diabetes Mother   . Pancreatic cancer Mother   . Lung cancer Father   . Diabetes Father   . Heart disease Father   . Esophageal cancer Father 68       Beer drinker  . Brain cancer Brother 1  . Brain cancer Maternal Grandfather   . Heart disease Paternal Grandfather   . Cancer Maternal Uncle        unknown form of cancer; cigar smoker  . Lung cancer Maternal Grandmother   . Brain cancer Cousin        dx in his late 90s-40s  . Schizophrenia Sister   . Anesthesia problems Neg Hx   . Hypotension Neg Hx   . Malignant hyperthermia Neg Hx   . Pseudochol deficiency Neg Hx   . Colon cancer Neg Hx   . Colon polyps Neg Hx   . Crohn's disease Neg Hx   . Ulcerative colitis Neg Hx     Social History:  Social History   Socioeconomic History  . Marital status: Widowed    Spouse name: None  . Number of children: 2  . Years of education: None  . Highest education level: None  Social Needs  . Financial resource strain: None  . Food insecurity - worry: None  . Food insecurity - inability: None  . Transportation needs - medical: None  . Transportation needs - non-medical: None  Occupational History  . Occupation: Aeronautical engineer: [employer]    Comment: [employer]  Tobacco Use  . Smoking status: Current Every Day Smoker    Packs/day: 0.50    Years: 35.00    Pack years: 17.50    Types: Cigarettes    Start date: 07/07/1974  . Smokeless tobacco: Never Used  Substance and Sexual Activity  . Alcohol use: Yes    Alcohol/week: 0.0 oz    Comment: Rare( stopped July 26, 2014)  . Drug use: No  . Sexual activity: No  Other Topics  Concern  . None  Social History Narrative  . None    Allergies:  Allergies  Allergen Reactions  . Bee Venom Anaphylaxis  . Haloperidol Lactate Other (See Comments)    Convulsions   . Meperidine Hcl Hives  . Xifaxan [Rifaximin] Other (See Comments)    Multiple complaints     Metabolic Disorder Labs: No results found for: HGBA1C, MPG No results found for: PROLACTIN Lab Results  Component Value Date   CHOL  207 (H) 10/23/2012   TRIG 319 (H) 10/23/2012   HDL 33 (L) 10/23/2012   CHOLHDL 6.3 10/23/2012   VLDL 64 (H) 10/23/2012   LDLCALC 110 (H) 10/23/2012   Lab Results  Component Value Date   TSH 0.706 09/22/2015    Therapeutic Level Labs: No results found for: LITHIUM No results found for: VALPROATE No components found for:  CBMZ  Current Medications: Current Outpatient Medications  Medication Sig Dispense Refill  . ALPRAZolam (XANAX) 1 MG tablet Take 1 tablet (1 mg total) by mouth 2 (two) times daily. 60 tablet 2  . Calcium-Magnesium-Vitamin D (CALCIUM 500 PO) Take 1 tablet by mouth 2 (two) times daily.    Marland Kitchen EPINEPHrine (EPI-PEN) 0.3 mg/0.3 mL DEVI Inject 0.3 mg into the muscle as needed. For bee venom    . fish oil-omega-3 fatty acids 1000 MG capsule Take 2 g by mouth every morning.    Marland Kitchen FOLIC ACID PO Take 1 mg by mouth 2 (two) times daily.     . hydrochlorothiazide (MICROZIDE) 12.5 MG capsule TAKE 1 CAPSULE BY MOUTH EVERY DAY 90 capsule 3  . methotrexate 2.5 MG tablet Take by mouth once a week. Taking 0.25mg  6 tabs Weekly    . methylphenidate (RITALIN) 10 MG tablet Take 1 tablet (10 mg total) by mouth 2 (two) times daily with breakfast and lunch. 60 tablet 0  . methylphenidate (RITALIN) 10 MG tablet Take 1 tablet (10 mg total) by mouth 2 (two) times daily with breakfast and lunch. 60 tablet 0  . metoprolol succinate (TOPROL-XL) 50 MG 24 hr tablet TAKE 1 TABLET TWICE A DAY  WITH OR IMMEDIATELY        FOLLOWING A MEAL. (DOSE    INCREASED) 180 tablet 3  . Multiple  Vitamin (MULITIVITAMIN WITH MINERALS) TABS Take 1 tablet by mouth every morning.    Marland Kitchen NIFEdipine (PROCARDIA XL/ADALAT-CC) 90 MG 24 hr tablet TAKE 1 TABLET DAILY 90 tablet 3  . nitroGLYCERIN (NITROSTAT) 0.4 MG SL tablet Place 1 tablet (0.4 mg total) under the tongue every 5 (five) minutes x 3 doses as needed for chest pain. 25 tablet 3  . pantoprazole (PROTONIX) 40 MG tablet TAKE 1 TABLET DAILY 30 tablet 6  . PARoxetine (PAXIL) 20 MG tablet Take 1.5 tablets (30 mg total) by mouth daily. 135 tablet 2  . Probiotic Product (PHILLIPS COLON HEALTH PO) Take 1 capsule by mouth daily.    . simvastatin (ZOCOR) 40 MG tablet Take 40 mg by mouth every morning.     . sodium chloride (MURO 128) 5 % ophthalmic solution Place 1 drop into both eyes every 3 (three) hours as needed.     . Tocilizumab (ACTEMRA) 200 MG/10ML SOLN Inject into the vein every 30 (thirty) days. IV Administered Every 4 Weeks    . Abatacept (ORENCIA IV) Inject into the vein.     No current facility-administered medications for this visit.      Musculoskeletal: Strength & Muscle Tone: within normal limits Gait & Station: normal Patient leans: N/A  Psychiatric Specialty Exam: Review of Systems  Constitutional: Positive for malaise/fatigue.  Musculoskeletal: Positive for joint pain and myalgias.  All other systems reviewed and are negative.   Blood pressure 107/72, pulse 76, height 5\' 4"  (1.626 m), weight 185 lb (83.9 kg), SpO2 94 %.Body mass index is 31.76 kg/m.  General Appearance: Casual and Fairly Groomed  Eye Contact:  Good  Speech:  Clear and Coherent  Volume:  Normal  Mood:  Euthymic  Affect: Bright  Thought Process:  Goal Directed  Orientation:  Full (Time, Place, and Person)  Thought Content: WDL   Suicidal Thoughts:  No  Homicidal Thoughts:  No  Memory:  Immediate;   Good Recent;   Good Remote;   Good  Judgement:  Good  Insight:  Good  Psychomotor Activity:  Decreased  Concentration:  Concentration: Fair and  Attention Span: Fair  Recall:  Good  Fund of Knowledge: Good  Language: Good  Akathisia:  No  Handed:  Right  AIMS (if indicated): not done  Assets:  Communication Skills Desire for Improvement Resilience Social Support Talents/Skills Vocational/Educational  ADL's:  Intact  Cognition: WNL  Sleep:  Good   Screenings:   Assessment and Plan: Patient is a 57 year old female with a history of depression and anxiety.  She is doing very well on her current regimen so she will continue Paxil 20 mg daily for depression, Xanax 1 mg twice a day for anxiety and methylphenidate 10 mg twice daily as needed for focus and energy.  She will return to see me in 3 months   Levonne Spiller, MD 12/24/2017, 8:49 AM

## 2017-12-25 ENCOUNTER — Telehealth (HOSPITAL_COMMUNITY): Payer: Self-pay | Admitting: *Deleted

## 2017-12-25 NOTE — Telephone Encounter (Signed)
Methylphenidate approved online with cover my meds.

## 2017-12-25 NOTE — Telephone Encounter (Signed)
Prior authorization for Methylphenidate received. Submitted online with cover my meds.

## 2017-12-25 NOTE — Telephone Encounter (Signed)
yes

## 2017-12-28 ENCOUNTER — Other Ambulatory Visit: Payer: Self-pay | Admitting: Cardiology

## 2017-12-31 ENCOUNTER — Telehealth (HOSPITAL_COMMUNITY): Payer: Self-pay | Admitting: *Deleted

## 2017-12-31 NOTE — Telephone Encounter (Signed)
Received fax from [place] stating that request for Methylphenidate has been approved from 11/25/17-12/25/18. PA #[id number].

## 2018-01-27 ENCOUNTER — Other Ambulatory Visit: Payer: Self-pay | Admitting: Cardiology

## 2018-01-27 MED ORDER — NITROGLYCERIN 0.4 MG SL SUBL: 0.4000 mg | SUBLINGUAL_TABLET | SUBLINGUAL | 3 refills | Status: DC | PRN

## 2018-01-27 NOTE — Telephone Encounter (Signed)
Refilled NTG per patient call request

## 2018-02-11 ENCOUNTER — Encounter: Payer: Self-pay | Admitting: Gastroenterology

## 2018-03-07 ENCOUNTER — Other Ambulatory Visit: Payer: Self-pay | Admitting: Cardiology

## 2018-03-25 ENCOUNTER — Ambulatory Visit (INDEPENDENT_AMBULATORY_CARE_PROVIDER_SITE_OTHER): Payer: Commercial Managed Care - PPO | Admitting: Psychiatry

## 2018-03-25 ENCOUNTER — Encounter (HOSPITAL_COMMUNITY): Payer: Self-pay | Admitting: Psychiatry

## 2018-03-25 VITALS — BP 132/78 | HR 78 | Ht 64.0 in | Wt 185.0 lb

## 2018-03-25 DIAGNOSIS — F17210 Nicotine dependence, cigarettes, uncomplicated: Secondary | ICD-10-CM | POA: Diagnosis not present

## 2018-03-25 DIAGNOSIS — F322 Major depressive disorder, single episode, severe without psychotic features: Secondary | ICD-10-CM

## 2018-03-25 DIAGNOSIS — M2550 Pain in unspecified joint: Secondary | ICD-10-CM | POA: Diagnosis not present

## 2018-03-25 MED ORDER — METHYLPHENIDATE HCL 10 MG PO TABS: 10.0000 mg | ORAL_TABLET | Freq: Two times a day (BID) | ORAL | 0 refills | Status: DC

## 2018-03-25 MED ORDER — ALPRAZOLAM 1 MG PO TABS: 1.0000 mg | ORAL_TABLET | Freq: Two times a day (BID) | ORAL | 2 refills | Status: DC

## 2018-03-25 MED ORDER — PAROXETINE HCL 20 MG PO TABS: 30.0000 mg | ORAL_TABLET | Freq: Every day | ORAL | 2 refills | Status: DC

## 2018-03-25 NOTE — Progress Notes (Signed)
BH MD/PA/NP OP Progress Note  03/25/2018 8:48 AM Rachel Sutton  MRN:  [id number]  Chief Complaint:  Chief Complaint    Depression; Anxiety; Follow-up     HPI: this patient is a 57-year-old widowed white female who is currently living alone in [place]. She works as a staff accountant for [employer]   The patient was referred by her primary physician Dr. Edward Harkins for further assessment and treatment of anxiety and depression.  The patient states that she does not really have any history of mental illness or treatment. She and her husband moved her parents here a few years ago because their health was declining.she took care of both of them. Her mother died in 2013 of pancreatic cancer and her father died 7 months later. During this time Dr. Hawkins prescribed clonazepam to help with anxiety. In January 2014 her husband was diagnosed with a myocardial infarction congestive heart failure and COPD. His health declined quickly and he died in August 2015.  The patient returns after 3 months.  Overall she is doing fairly well.  Her rheumatoid arthritis is still not yielding to any medication and she is going to try a new biologic infusion.  Her rheumatologist does not think she has lupus however.  Overall her mood is good she is sleeping well and her energy is fairly good with the methylphenidate.  Her anxiety is under good control as well.  She is actually trying to meet people through a dating site and is corresponding by email with 1 of the people she met through the site Visit Diagnosis:    ICD-10-CM   1. Major depressive disorder, single episode, severe without psychotic features ([facility]) F32.2     Past Psychiatric History: none  Past Medical History:  Past Medical History:  Diagnosis Date  . Anxiety   . Arthritis   . Breast cancer ([facility]) 02/22/2012   Right T1c, N0  . C. difficile colitis   . Colitis, collagenous 09/22/2015  . Depression   . Essential hypertension, benign   . Fuchs'  corneal dystrophy   . GERD (gastroesophageal reflux disease)   . Mitral valve prolapse    Mild mitral regurgitation  . Mixed hyperlipidemia   . Seizures ([facility])    Only with dose of haldol; no more since that was stopped.    Past Surgical History:  Procedure Laterality Date  . ABDOMINAL HYSTERECTOMY  1999  . BIOPSY N/A 09/28/2015   Procedure: BIOPSY;  Surgeon: Sandi L Fields, MD;  Location: [facility];  Service: Endoscopy;  Laterality: N/A;  . BIOPSY  04/05/2016   Procedure: BIOPSY;  Surgeon: Sandi L Fields, MD;  [facility];  Service: Endoscopy;;  colon bx  . BREAST LUMPECTOMY  10/30/2002   right  . Carpal tunnel R/L  1992  . Cervical spine fusion X2 since last visit    . COLONOSCOPY WITH PROPOFOL N/A 09/28/2015   Procedure: COLONOSCOPY WITH PROPOFOL;  Surgeon: Sandi L Fields, MD;  Location: [facility];  Service: Endoscopy;  Laterality: N/A;  procedure 1 cecum time in 1232  time out 1246   total time 14 mintes  . COLONOSCOPY WITH PROPOFOL N/A 04/05/2016   Procedure: COLONOSCOPY WITH PROPOFOL;  Surgeon: Sandi L Fields, MD;  [facility];  Service: Endoscopy;  Laterality: N/A;  1200  . 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 ELBOW SURGERY Right  tennis elbow release 2012  . ESOPHAGOGASTRODUODENOSCOPY  10/26/2012   Procedure: ESOPHAGOGASTRODUODENOSCOPY (EGD);    Surgeon: Robert D Kaplan, MD;  Location: [facility];  Service: Endoscopy;  Laterality: N/A;  . ESOPHAGOGASTRODUODENOSCOPY (EGD) WITH PROPOFOL N/A 09/28/2015   Procedure: ESOPHAGOGASTRODUODENOSCOPY (EGD) WITH PROPOFOL;  Surgeon: Sandi L Fields, MD;  Location: [facility];  Service: Endoscopy;  Laterality: N/A;  . KNEE ARTHROSCOPY Left   . LEFT HEART CATHETERIZATION WITH CORONARY ANGIOGRAM N/A 10/23/2012   Procedure: LEFT HEART CATHETERIZATION WITH CORONARY ANGIOGRAM;  Surgeon: Peter M Jordan, MD;  Location: [facility];  Service: Cardiovascular;  Laterality: N/A;  . OVARIAN CYST REMOVAL     prior to hysterectomy, right   . Ray cage fusion  1997  . SENTINEL LYMPH NODE BIOPSY  10/30/2002   right    Family Psychiatric History: See below  Family History:  Family History  Problem Relation Age of Onset  . Diabetes Mother   . Pancreatic cancer Mother   . Lung cancer Father   . Diabetes Father   . Heart disease Father   . Esophageal cancer Father 72       Beer drinker  . Brain cancer Brother 49  . Brain cancer Maternal Grandfather   . Heart disease Paternal Grandfather   . Cancer Maternal Uncle        unknown form of cancer; cigar smoker  . Lung cancer Maternal Grandmother   . Brain cancer Cousin        dx in his late 30s-40s  . Schizophrenia Sister   . Anesthesia problems Neg Hx   . Hypotension Neg Hx   . Malignant hyperthermia Neg Hx   . Pseudochol deficiency Neg Hx   . Colon cancer Neg Hx   . Colon polyps Neg Hx   . Crohn's disease Neg Hx   . Ulcerative colitis Neg Hx     Social History:  Social History   Socioeconomic History  . Marital status: Widowed    Spouse name: Not on file  . Number of children: 2  . Years of education: Not on file  . Highest education level: Not on file  Occupational History  . Occupation: Accountant    Employer: [employer]    Comment: [employer]  Social Needs  . Financial resource strain: Not on file  . Food insecurity:    Worry: Not on file    Inability: Not on file  . Transportation needs:    Medical: Not on file    Non-medical: Not on file  Tobacco Use  . Smoking status: Current Every Day Smoker    Packs/day: 0.50    Years: 35.00    Pack years: 17.50    Types: Cigarettes    Start date: 07/07/1974  . Smokeless tobacco: Never Used  Substance and Sexual Activity  . Alcohol use: Yes    Alcohol/week: 0.0 oz    Comment: Rare( stopped July 26, 2014)  . Drug use: No  . Sexual activity: Never  Lifestyle  . Physical activity:    Days per week: Not on file    Minutes per session: Not on file  . Stress: Not on file  Relationships  . Social  connections:    Talks on phone: Not on file    Gets together: Not on file    Attends religious service: Not on file    Active member of club or organization: Not on file    Attends meetings of clubs or organizations: Not on file    Relationship status: Not on file  Other Topics Concern  . Not on file  Social   History Narrative  . Not on file    Allergies:  Allergies  Allergen Reactions  . Bee Venom Anaphylaxis  . Haloperidol Lactate Other (See Comments)    Convulsions   . Meperidine Hcl Hives  . Xifaxan [Rifaximin] Other (See Comments)    Multiple complaints     Metabolic Disorder Labs: No results found for: HGBA1C, MPG No results found for: PROLACTIN Lab Results  Component Value Date   CHOL 207 (H) 10/23/2012   TRIG 319 (H) 10/23/2012   HDL 33 (L) 10/23/2012   CHOLHDL 6.3 10/23/2012   VLDL 64 (H) 10/23/2012   LDLCALC 110 (H) 10/23/2012   Lab Results  Component Value Date   TSH 0.706 09/22/2015    Therapeutic Level Labs: No results found for: LITHIUM No results found for: VALPROATE No components found for:  CBMZ  Current Medications: Current Outpatient Medications  Medication Sig Dispense Refill  . ALPRAZolam (XANAX) 1 MG tablet Take 1 tablet (1 mg total) by mouth 2 (two) times daily. 60 tablet 2  . Calcium-Magnesium-Vitamin D (CALCIUM 500 PO) Take 1 tablet by mouth 2 (two) times daily.    . EPINEPHrine (EPI-PEN) 0.3 mg/0.3 mL DEVI Inject 0.3 mg into the muscle as needed. For bee venom    . fish oil-omega-3 fatty acids 1000 MG capsule Take 2 g by mouth every morning.    . FOLIC ACID PO Take 1 mg by mouth 2 (two) times daily.     . hydrochlorothiazide (MICROZIDE) 12.5 MG capsule TAKE 1 CAPSULE DAILY 90 capsule 3  . methylphenidate (RITALIN) 10 MG tablet Take 1 tablet (10 mg total) by mouth 2 (two) times daily with breakfast and lunch. 60 tablet 0  . methylphenidate (RITALIN) 10 MG tablet Take 1 tablet (10 mg total) by mouth 2 (two) times daily with breakfast and  lunch. 60 tablet 0  . metoprolol succinate (TOPROL-XL) 50 MG 24 hr tablet TAKE 1 TABLET TWICE A DAY  WITH OR IMMEDIATELY        FOLLOWING A MEAL. (DOSE    INCREASED) 180 tablet 3  . Multiple Vitamin (MULITIVITAMIN WITH MINERALS) TABS Take 1 tablet by mouth every morning.    . NIFEdipine (PROCARDIA XL/ADALAT-CC) 90 MG 24 hr tablet TAKE 1 TABLET DAILY 90 tablet 3  . nitroGLYCERIN (NITROSTAT) 0.4 MG SL tablet Place 1 tablet (0.4 mg total) under the tongue every 5 (five) minutes x 3 doses as needed for chest pain. 25 tablet 3  . pantoprazole (PROTONIX) 40 MG tablet TAKE 1 TABLET DAILY 30 tablet 6  . PARoxetine (PAXIL) 20 MG tablet Take 1.5 tablets (30 mg total) by mouth daily. 135 tablet 2  . Probiotic Product (PHILLIPS COLON HEALTH PO) Take 1 capsule by mouth daily.    . simvastatin (ZOCOR) 40 MG tablet Take 40 mg by mouth every morning.     . sodium chloride (MURO 128) 5 % ophthalmic solution Place 1 drop into both eyes every 3 (three) hours as needed.     . methylphenidate (RITALIN) 10 MG tablet Take 1 tablet (10 mg total) by mouth 2 (two) times daily with breakfast and lunch. 60 tablet 0   No current facility-administered medications for this visit.      Musculoskeletal: Strength & Muscle Tone: within normal limits Gait & Station: normal Patient leans: N/A  Psychiatric Specialty Exam: Review of Systems  Constitutional: Positive for malaise/fatigue.  Musculoskeletal: Positive for joint pain.  All other systems reviewed and are negative.   Blood pressure 132/78, pulse   78, height 5' 4" (1.626 m), weight 185 lb (83.9 kg), SpO2 93 %.Body mass index is 31.76 kg/m.  General Appearance: Casual, Neat and Well Groomed  Eye Contact:  Good  Speech:  Clear and Coherent  Volume:  Normal  Mood:  Euthymic  Affect:  Congruent  Thought Process:  Goal Directed  Orientation:  Full (Time, Place, and Person)  Thought Content: WDL   Suicidal Thoughts:  No  Homicidal Thoughts:  No  Memory:   Immediate;   Good Recent;   Good Remote;   Good  Judgement:  Good  Insight:  Good  Psychomotor Activity:  Decreased  Concentration:  Concentration: Good and Attention Span: Good  Recall:  Good  Fund of Knowledge: Good  Language: Good  Akathisia:  No  Handed:  Right  AIMS (if indicated): not done  Assets:  Communication Skills Desire for Improvement Resilience Social Support Talents/Skills Vocational/Educational  ADL's:  Intact  Cognition: WNL  Sleep:  Good   Screenings:   Assessment and Plan: This patient is a 56-year-old female with a history of depression and anxiety.  She is now battling chronic fatigue with associated joint pain from rheumatoid arthritis.  She is doing well on her current regimen and she will continue Paxil 30 mg daily for depression and anxiety, Xanax 1 mg twice a day for anxiety and methylphenidate 10 mg twice a day for focus.  She will return to see me in 3 months   Deborah Ross, MD 03/25/2018, 8:48 AM 

## 2018-04-15 NOTE — Progress Notes (Signed)
Cardiology Office Note  Date: 04/17/2018   ID: Rachel Sutton, DOB 1961-10-28, MRN [id number]  PCP: Sinda Du, MD  Primary Cardiologist: Rozann Lesches, MD   Chief Complaint  Patient presents with  . Mitral Valve Prolapse    History of Present Illness: Rachel Sutton is a 57 y.o. female last seen in October 2018.  She presents for a follow-up visit.  Since last assessment she has had no chest pain or palpitations, no dizziness or syncope.  She continues to work full-time at [employer].  No regular exercise plan, although we did talk about walking on a path at work.  I reviewed her medications which are stable from a cardiac perspective and outlined below.  She has undergone some medication adjustments related to rheumatoid arthritis.  Follows with rheumatology.  Follow-up echocardiogram in October 2018 showed LVEF 60 to 65% with mild bileaflet mitral prolapse associated with mild mitral regurgitation.  I went over these results with her today.  She has had no major change.  Past Medical History:  Diagnosis Date  . Anxiety   . Arthritis   . Breast cancer ([place]) 02/22/2012   Right T1c, N0  . C. difficile colitis   . Colitis, collagenous 09/22/2015  . Depression   . Essential hypertension, benign   . Fuchs' corneal dystrophy   . GERD (gastroesophageal reflux disease)   . Mitral valve prolapse    Mild mitral regurgitation  . Mixed hyperlipidemia   . Seizures ([place])    Only with dose of haldol; no more since that was stopped.    Past Surgical History:  Procedure Laterality Date  . ABDOMINAL HYSTERECTOMY  1999  . BIOPSY N/A 09/28/2015   Procedure: BIOPSY;  Surgeon: Rachel Binder, MD;  Location: [facility];  Service: Endoscopy;  Laterality: N/A;  . BIOPSY  04/05/2016   Procedure: BIOPSY;  Surgeon: Rachel Binder, MD;  [facility];  Service: Endoscopy;;  colon bx  . BREAST LUMPECTOMY  10/30/2002   right  . Carpal tunnel R/L  1992  . Cervical spine fusion X2 since  last visit    . COLONOSCOPY WITH PROPOFOL N/A 09/28/2015   Procedure: COLONOSCOPY WITH PROPOFOL;  Surgeon: Rachel Binder, MD;  Location: [facility];  Service: Endoscopy;  Laterality: N/A;  procedure 1 cecum time in 1232  time out 1246   total time 14 mintes  . COLONOSCOPY WITH PROPOFOL N/A 04/05/2016   Procedure: COLONOSCOPY WITH PROPOFOL;  Surgeon: Rachel Binder, MD;  [facility];  Service: Endoscopy;  Laterality: N/A;  1200  . 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 ELBOW SURGERY Right  tennis elbow release 2012  . ESOPHAGOGASTRODUODENOSCOPY  10/26/2012   Procedure: ESOPHAGOGASTRODUODENOSCOPY (EGD);  Surgeon: Rachel Castle, MD;  Location: [facility];  Service: Endoscopy;  Laterality: N/A;  . ESOPHAGOGASTRODUODENOSCOPY (EGD) WITH PROPOFOL N/A 09/28/2015   Procedure: ESOPHAGOGASTRODUODENOSCOPY (EGD) WITH PROPOFOL;  Surgeon: Rachel Binder, MD;  [facility];  Service: Endoscopy;  Laterality: N/A;  . KNEE ARTHROSCOPY Left   . LEFT HEART CATHETERIZATION WITH CORONARY ANGIOGRAM N/A 10/23/2012   Procedure: LEFT HEART CATHETERIZATION WITH CORONARY ANGIOGRAM;  Surgeon: Rachel M Martinique, MD;  Location: [facility] CATH LAB;  Service: Cardiovascular;  Laterality: N/A;  . OVARIAN CYST REMOVAL     prior to hysterectomy, right  . Ray cage fusion  1997  . SENTINEL LYMPH NODE BIOPSY  10/30/2002   right    Current Outpatient Medications  Medication Sig Dispense Refill  . ALPRAZolam (XANAX) 1 MG tablet Take 1 tablet (1 mg total) by mouth 2 (two) times daily. 60 tablet 2  . Calcium-Magnesium-Vitamin D (CALCIUM 500 PO) Take 1 tablet by mouth 2 (two) times daily.    Marland Kitchen EPINEPHrine (EPI-PEN) 0.3 mg/0.3 mL DEVI Inject 0.3 mg into the muscle as needed. For bee venom    . fish oil-omega-3 fatty acids 1000 MG capsule Take 2 g by mouth every morning.    Marland Kitchen FOLIC ACID PO Take 1 mg by mouth 2 (two) times daily.     . hydrochlorothiazide (MICROZIDE) 12.5 MG capsule TAKE 1 CAPSULE DAILY 90 capsule 3  .  methylphenidate (RITALIN) 10 MG tablet Take 1 tablet (10 mg total) by mouth 2 (two) times daily with breakfast and lunch. 60 tablet 0  . metoprolol succinate (TOPROL-XL) 50 MG 24 hr tablet TAKE 1 TABLET TWICE A DAY  WITH OR IMMEDIATELY        FOLLOWING A MEAL. (DOSE    INCREASED) 180 tablet 3  . Multiple Vitamin (MULITIVITAMIN WITH MINERALS) TABS Take 1 tablet by mouth every morning.    Marland Kitchen NIFEdipine (PROCARDIA XL/ADALAT-CC) 90 MG 24 hr tablet TAKE 1 TABLET DAILY 90 tablet 3  . nitroGLYCERIN (NITROSTAT) 0.4 MG SL tablet Place 1 tablet (0.4 mg total) under the tongue every 5 (five) minutes x 3 doses as needed for chest pain. 25 tablet 3  . pantoprazole (PROTONIX) 40 MG tablet TAKE 1 TABLET DAILY 30 tablet 6  . PARoxetine (PAXIL) 20 MG tablet Take 1.5 tablets (30 mg total) by mouth daily. 135 tablet 2  . Probiotic Product (PHILLIPS COLON HEALTH PO) Take 1 capsule by mouth daily.    . simvastatin (ZOCOR) 40 MG tablet Take 40 mg by mouth every morning.     . sodium chloride (MURO 128) 5 % ophthalmic solution Place 1 drop into both eyes every 3 (three) hours as needed.      No current facility-administered medications for this visit.    Allergies:  Bee venom; Haloperidol lactate; Meperidine hcl; and Xifaxan [rifaximin]   Social History: The patient  reports that she has been smoking cigarettes.  She started smoking about 43 years ago. She has a 17.50 pack-year smoking history. She has never used smokeless tobacco. She reports that she drinks alcohol. She reports that she does not use drugs.   ROS:  Please see the history of present illness. Otherwise, complete review of systems is positive for arthritic pain and stiffness, intermittent.  All other systems are reviewed and negative.   Physical Exam: VS:  BP 120/68 (BP Location: Left Arm)   Pulse 73   Ht 5\' 7"  (1.702 m)   Wt 182 lb (82.6 kg)   SpO2 96%   BMI 28.51 kg/m , BMI Body mass index is 28.51 kg/m.  Wt Readings from Last 3 Encounters:    04/17/18 182 lb (82.6 kg)  10/08/17 182 lb (82.6 kg)  04/04/17 187 lb (84.8 kg)    General: Patient appears comfortable at rest. HEENT: Conjunctiva and lids normal, oropharynx clear. Neck: Supple, no elevated JVP or carotid bruits, no thyromegaly. Lungs: Clear to auscultation, nonlabored breathing at rest. Cardiac: Regular rate and rhythm, no S3, very soft apical systolic murmur. Abdomen: Soft, nontender, bowel sounds present, no guarding or rebound. Extremities: No pitting edema, distal pulses 2+. Skin: Warm and dry. Musculoskeletal: No kyphosis. Neuropsychiatric: Alert and oriented x3, affect grossly appropriate.  ECG: I personally reviewed the tracing from  10/08/2017 which showed normal sinus rhythm.  Recent Labwork:  April 2017: Potassium 4.4, BUN 15, creatinine 0.7, hemoglobin 12.6, platelets 389  Other Studies Reviewed Today:  Echocardiogram 10/10/2017: Study Conclusions  - Left ventricle: The cavity size was normal. Wall thickness was   normal. Systolic function was normal. The estimated ejection   fraction was in the range of 60% to 65%. Wall motion was normal;   there were no regional wall motion abnormalities. Doppler   parameters are consistent with abnormal left ventricular   relaxation (grade 1 diastolic dysfunction). - Mitral valve: Mild bileaflet prolapse with mild central   regurgitation.  Assessment and Plan:  1.  Stable mitral valve prolapse, mild with associated mild mitral regurgitation.  She remains asymptomatic in terms of palpitations on Toprol-XL and has had no syncope.  Continue with observation.  2.  Essential hypertension, following with Dr. Luan Pulling.  Blood pressure is well controlled today.  3.  Mixed hyperlipidemia on Zocor.  Keep follow-up with Dr. Luan Pulling.  4.  Tobacco abuse.  She has had difficulty quitting.  Smoking cessation discussed.  Current medicines were reviewed with the patient today.  Disposition: Follow-up in 1  year.  Signed, Satira Sark, MD, [facility] 04/17/2018 9:04 AM    [facility]. [address], [place], [place] [address] Phone: [phone]; Fax: [phone]

## 2018-04-16 ENCOUNTER — Other Ambulatory Visit: Payer: Self-pay | Admitting: Cardiology

## 2018-04-17 ENCOUNTER — Ambulatory Visit: Payer: Commercial Managed Care - PPO | Admitting: Cardiology

## 2018-04-17 ENCOUNTER — Encounter: Payer: Self-pay | Admitting: Cardiology

## 2018-04-17 VITALS — BP 120/68 | HR 73 | Ht 67.0 in | Wt 182.0 lb

## 2018-04-17 DIAGNOSIS — I10 Essential (primary) hypertension: Secondary | ICD-10-CM

## 2018-04-17 DIAGNOSIS — I341 Nonrheumatic mitral (valve) prolapse: Secondary | ICD-10-CM

## 2018-04-17 DIAGNOSIS — Z720 Tobacco use: Secondary | ICD-10-CM | POA: Diagnosis not present

## 2018-04-17 DIAGNOSIS — E782 Mixed hyperlipidemia: Secondary | ICD-10-CM

## 2018-04-17 NOTE — Patient Instructions (Addendum)
Your physician wants you to follow-up in: 1 year with Dr.McDowell You will receive a reminder letter in the mail two months in advance. If you don't receive a letter, please call our office to schedule the follow-up appointment.    Your physician recommends that you continue on your current medications as directed. Please refer to the Current Medication list given to you today.    If you need a refill on your cardiac medications before your next appointment, please call your pharmacy.     No lab work or tests ordered today.      Thank you for choosing [facility] !        

## 2018-06-24 ENCOUNTER — Encounter (HOSPITAL_COMMUNITY): Payer: Self-pay | Admitting: Psychiatry

## 2018-06-24 ENCOUNTER — Ambulatory Visit (HOSPITAL_COMMUNITY): Payer: Commercial Managed Care - PPO | Admitting: Psychiatry

## 2018-06-24 VITALS — BP 116/75 | HR 70 | Ht 67.0 in | Wt 181.0 lb

## 2018-06-24 DIAGNOSIS — F322 Major depressive disorder, single episode, severe without psychotic features: Secondary | ICD-10-CM | POA: Diagnosis not present

## 2018-06-24 MED ORDER — ALPRAZOLAM 1 MG PO TABS: 1.0000 mg | ORAL_TABLET | Freq: Two times a day (BID) | ORAL | 2 refills | Status: DC

## 2018-06-24 MED ORDER — PAROXETINE HCL 20 MG PO TABS
20.0000 mg | ORAL_TABLET | Freq: Every day | ORAL | 2 refills | Status: DC
Start: 2018-06-24 — End: 2018-09-24

## 2018-06-24 NOTE — Progress Notes (Signed)
BH MD/PA/NP OP Progress Note  06/24/2018 9:07 AM Rachel Sutton  MRN:  [id number]  Chief Complaint:  Chief Complaint    Depression; Anxiety; Follow-up     HPI: this patient is a 57 year old widowed white female who is currently living alone in [place]. She works as a Engineer, production for [employer]   The patient was referred by her primary physician Dr. Marolyn Haller for further assessment and treatment of anxiety and depression.  The patient states that she does not really have any history of mental illness or treatment. She and her husband moved her parents here a few years ago because their health was declining.she took care of both of them. Her mother died in Feb 21, 2012 of pancreatic cancer and her father died 90 months later. During this time Dr. Luan Pulling prescribed clonazepam to help with anxiety. In January 2014 her husband was diagnosed with a myocardial infarction congestive heart failure and COPD. His health declined quickly and he died in 20-Aug-2014.  The patient returns after 3 months.  Overall she is doing very well.  She is met a man through an online dating service and they have gotten much more serious.  She is actually going to start living with him fairly soon.  He is divorced and works full-time and states that he is very supportive.  Her job is going well and she is on a new biologic for rheumatoid arthritis and it seems to be helping a good deal.  Her health is been quite stable.  She feels so good that she would like to start to cut down the Paxil so we will go down to 20 mg daily.  On the Xanax she is on 1 mg twice a day and I suggested going down to 1 mg in the morning and half a pill at night at least for several weeks. Visit Diagnosis:    ICD-10-CM   1. 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 Anxiety   . Arthritis   .  Breast cancer ([place]) 02/22/2012   Right T1c, N0  . C. difficile colitis   . Colitis, collagenous 09/22/2015  . Depression   . Essential hypertension, benign   . Fuchs' corneal dystrophy   . GERD (gastroesophageal reflux disease)   . Mitral valve prolapse    Mild mitral regurgitation  . Mixed hyperlipidemia   . Seizures ([place])    Only with dose of haldol; no more since that was stopped.    Past Surgical History:  Procedure Laterality Date  . ABDOMINAL HYSTERECTOMY  1999  . BIOPSY N/A 09/28/2015   Procedure: BIOPSY;  Surgeon: Danie Binder, MD;  Location: [facility];  Service: Endoscopy;  Laterality: N/A;  . BIOPSY  04/05/2016   Procedure: BIOPSY;  Surgeon: Danie Binder, MD;  [facility];  Service: Endoscopy;;  colon bx  . BREAST LUMPECTOMY  10/30/2002   right  . Carpal tunnel R/L  1992  . Cervical spine fusion X2 since last visit    . COLONOSCOPY WITH PROPOFOL N/A 09/28/2015   Procedure: COLONOSCOPY WITH PROPOFOL;  Surgeon: Danie Binder, MD;  [facility];  Service: Endoscopy;  Laterality: N/A;  procedure 1 cecum time in 02/20/31  time out 1246   total time 14 mintes  . COLONOSCOPY WITH PROPOFOL N/A 04/05/2016   Procedure: COLONOSCOPY WITH PROPOFOL;  Surgeon: Danie Binder, MD;  [facility];  Service: Endoscopy;  Laterality: N/A;  1200  . 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 ELBOW SURGERY Right  tennis elbow release 2012  . ESOPHAGOGASTRODUODENOSCOPY  10/26/2012   Procedure: ESOPHAGOGASTRODUODENOSCOPY (EGD);  Surgeon: Inda Castle, MD;  Location: [facility];  Service: Endoscopy;  Laterality: N/A;  . ESOPHAGOGASTRODUODENOSCOPY (EGD) WITH PROPOFOL N/A 09/28/2015   Procedure: ESOPHAGOGASTRODUODENOSCOPY (EGD) WITH PROPOFOL;  Surgeon: Danie Binder, MD;  [facility];  Service: Endoscopy;  Laterality: N/A;  . KNEE ARTHROSCOPY Left   . LEFT HEART CATHETERIZATION WITH CORONARY ANGIOGRAM N/A 10/23/2012   Procedure: LEFT HEART CATHETERIZATION WITH  CORONARY ANGIOGRAM;  Surgeon: Peter M Martinique, MD;  Location: [facility] CATH LAB;  Service: Cardiovascular;  Laterality: N/A;  . OVARIAN CYST REMOVAL     prior to hysterectomy, right  . Ray cage fusion  1997  . SENTINEL LYMPH NODE BIOPSY  10/30/2002   right    Family Psychiatric History: none  Family History:  Family History  Problem Relation Age of Onset  . Diabetes Mother   . Pancreatic cancer Mother   . Lung cancer Father   . Diabetes Father   . Heart disease Father   . Esophageal cancer Father 42       Beer drinker  . Brain cancer Brother 42  . Brain cancer Maternal Grandfather   . Heart disease Paternal Grandfather   . Cancer Maternal Uncle        unknown form of cancer; cigar smoker  . Lung cancer Maternal Grandmother   . Brain cancer Cousin        dx in his late 26s-40s  . Schizophrenia Sister   . Anesthesia problems Neg Hx   . Hypotension Neg Hx   . Malignant hyperthermia Neg Hx   . Pseudochol deficiency Neg Hx   . Colon cancer Neg Hx   . Colon polyps Neg Hx   . Crohn's disease Neg Hx   . Ulcerative colitis Neg Hx     Social History:  Social History   Socioeconomic History  . Marital status: Widowed    Spouse name: Not on file  . Number of children: 2  . Years of education: Not on file  . Highest education level: Not on file  Occupational History  . Occupation: Aeronautical engineer: [employer]    Comment: [employer]  . Financial resource strain: Not on file  . Food insecurity:    Worry: Not on file    Inability: Not on file  . Transportation needs:    Medical: Not on file    Non-medical: Not on file  Tobacco Use  . Smoking status: Current Every Day Smoker    Packs/day: 0.50    Years: 35.00    Pack years: 17.50    Types: Cigarettes    Start date: 07/07/1974  . Smokeless tobacco: Never Used  Substance and Sexual Activity  . Alcohol use: Yes    Alcohol/week: 0.0 oz    Comment: Rare( stopped July 26, 2014)  . Drug use: No  . Sexual  activity: Never  Lifestyle  . Physical activity:    Days per week: Not on file    Minutes per session: Not on file  . Stress: Not on file  Relationships  . Social connections:    Talks on phone: Not on file    Gets together: Not on file    Attends  religious service: Not on file    Active member of club or organization: Not on file    Attends meetings of clubs or organizations: Not on file    Relationship status: Not on file  Other Topics Concern  . Not on file  Social History Narrative  . Not on file    Allergies:  Allergies  Allergen Reactions  . Bee Venom Anaphylaxis  . Haloperidol Lactate Other (See Comments)    Convulsions   . Meperidine Hcl Hives  . Xifaxan [Rifaximin] Other (See Comments)    Multiple complaints     Metabolic Disorder Labs: No results found for: HGBA1C, MPG No results found for: PROLACTIN Lab Results  Component Value Date   CHOL 207 (H) 10/23/2012   TRIG 319 (H) 10/23/2012   HDL 33 (L) 10/23/2012   CHOLHDL 6.3 10/23/2012   VLDL 64 (H) 10/23/2012   LDLCALC 110 (H) 10/23/2012   Lab Results  Component Value Date   TSH 0.706 09/22/2015    Therapeutic Level Labs: No results found for: LITHIUM No results found for: VALPROATE No components found for:  CBMZ  Current Medications: Current Outpatient Medications  Medication Sig Dispense Refill  . ALPRAZolam (XANAX) 1 MG tablet Take 1 tablet (1 mg total) by mouth 2 (two) times daily. 60 tablet 2  . Calcium-Magnesium-Vitamin D (CALCIUM 500 PO) Take 1 tablet by mouth 2 (two) times daily.    Marland Kitchen EPINEPHrine (EPI-PEN) 0.3 mg/0.3 mL DEVI Inject 0.3 mg into the muscle as needed. For bee venom    . fish oil-omega-3 fatty acids 1000 MG capsule Take 2 g by mouth every morning.    Marland Kitchen FOLIC ACID PO Take 1 mg by mouth 2 (two) times daily.     Marland Kitchen golimumab (SIMPONI ARIA) 50 MG/4ML SOLN injection Inject into the vein every 8 (eight) weeks.    . hydrochlorothiazide (MICROZIDE) 12.5 MG capsule TAKE 1 CAPSULE DAILY  90 capsule 3  . metoprolol succinate (TOPROL-XL) 50 MG 24 hr tablet TAKE 1 TABLET TWICE A DAY  WITH OR IMMEDIATELY        FOLLOWING A MEAL. (DOSE    INCREASED) 180 tablet 3  . Multiple Vitamin (MULITIVITAMIN WITH MINERALS) TABS Take 1 tablet by mouth every morning.    Marland Kitchen NIFEdipine (PROCARDIA XL/ADALAT-CC) 90 MG 24 hr tablet TAKE 1 TABLET DAILY 90 tablet 3  . nitroGLYCERIN (NITROSTAT) 0.4 MG SL tablet Place 1 tablet (0.4 mg total) under the tongue every 5 (five) minutes x 3 doses as needed for chest pain. 25 tablet 3  . pantoprazole (PROTONIX) 40 MG tablet TAKE 1 TABLET DAILY 30 tablet 6  . PARoxetine (PAXIL) 20 MG tablet Take 1 tablet (20 mg total) by mouth daily. 90 tablet 2  . Probiotic Product (PHILLIPS COLON HEALTH PO) Take 1 capsule by mouth daily.    . simvastatin (ZOCOR) 40 MG tablet Take 40 mg by mouth every morning.     . sodium chloride (MURO 128) 5 % ophthalmic solution Place 1 drop into both eyes every 3 (three) hours as needed.      No current facility-administered medications for this visit.      Musculoskeletal: Strength & Muscle Tone: within normal limits Gait & Station: normal Patient leans: N/A  Psychiatric Specialty Exam: Review of Systems  Musculoskeletal: Positive for joint pain.  All other systems reviewed and are negative.   Blood pressure 116/75, pulse 70, height 5' 7"  (1.702 m), weight 181 lb (82.1 kg), SpO2 94 %.Body mass index  is 28.35 kg/m.  General Appearance: Casual, Neat and Well Groomed  Eye Contact:  Good  Speech:  Clear and Coherent  Volume:  Normal  Mood:  Euthymic  Affect:  Congruent  Thought Process:  Goal Directed  Orientation:  Full (Time, Place, and Person)  Thought Content: WDL   Suicidal Thoughts:  No  Homicidal Thoughts:  No  Memory:  Immediate;   Good Recent;   Good Remote;   Good  Judgement:  Good  Insight:  Good  Psychomotor Activity:  Normal  Concentration:  Concentration: Good and Attention Span: Good  Recall:  Good  Fund  of Knowledge: Good  Language: Good  Akathisia:  No  Handed:  Right  AIMS (if indicated): not done  Assets:  Communication Skills Desire for Improvement Physical Health  ADL's:  Intact  Cognition: WNL  Sleep:  Good   Screenings:   Assessment and Plan: This patient is a 57 year old female with a history of depression and anxiety.  She is doing much better particular since she got into a new relationship.  She will continue Paxil but reduce the dose to 20 mg daily.  She no longer uses the methylphenidate.  She will continue Xanax and try to reduce the dose to 1 mg in the morning and 0.5 mg at bedtime.  She will return to see me in 3 months   Levonne Spiller, MD 06/24/2018, 9:07 AM

## 2018-09-24 ENCOUNTER — Ambulatory Visit (INDEPENDENT_AMBULATORY_CARE_PROVIDER_SITE_OTHER): Payer: Commercial Managed Care - PPO | Admitting: Psychiatry

## 2018-09-24 ENCOUNTER — Encounter (HOSPITAL_COMMUNITY): Payer: Self-pay | Admitting: Psychiatry

## 2018-09-24 VITALS — BP 130/82 | HR 66 | Ht 67.0 in | Wt 181.0 lb

## 2018-09-24 DIAGNOSIS — F322 Major depressive disorder, single episode, severe without psychotic features: Secondary | ICD-10-CM | POA: Diagnosis not present

## 2018-09-24 DIAGNOSIS — F17210 Nicotine dependence, cigarettes, uncomplicated: Secondary | ICD-10-CM | POA: Diagnosis not present

## 2018-09-24 MED ORDER — ALPRAZOLAM 0.5 MG PO TABS: 0.5000 mg | ORAL_TABLET | Freq: Every day | ORAL | 2 refills | Status: DC

## 2018-09-24 NOTE — Progress Notes (Signed)
[facility] MD/PA/NP OP Progress Note  09/24/2018 8:43 AM Rachel Sutton  MRN:  [id number]  Chief Complaint:  Chief Complaint    Depression; Anxiety; Follow-up     HPI:  this patient is a 57 year old recently remarried white female is currently living near [place].Marland Kitchen She works as a Engineer, production for [employer]   The patient was referred by her primary physician Dr. Marolyn Haller for further assessment and treatment of anxiety and depression.  The patient states that she does not really have any history of mental illness or treatment. She and her husband moved her parents here a few years ago because their health was declining.she took care of both of them. Her mother died in 2012-02-15 of pancreatic cancer and her father died 38 months later. During this time Dr. Luan Pulling prescribed clonazepam to help with anxiety. In January 2014 her husband was diagnosed with a myocardial infarction congestive heart failure and COPD. His health declined quickly and he died in 2014/08/14.  The patient returns after 3 months.  Last time she states that she had met someone online and they were getting along very well.  She tells me now that they got married last month.  This was rather sudden but she feels it is the right thing to do.  [place] she really [place].  They are getting along very well.  The only downside is that her daughter became angry about it but everyone else in the family is supportive.  She has cut down her Xanax to 0.5 mg/day.  She is doing well on Paxil 20 mg but was still like to get off of it.  I suggested we cut it down to 10 mg daily for 3 months and then determine if she can go off of it.  She is sleeping well her energy is fairly good.  She is on a new biologic for treatment of rheumatoid arthritis but still has flareups of joint pain at times. Visit Diagnosis:    ICD-10-CM   1. Major depressive disorder, single episode, severe without psychotic features  ([place]) F32.2     Past Psychiatric History: none  Past Medical History:  Past Medical History:  Diagnosis Date  . Anxiety   . Arthritis   . Breast cancer ([place]) 02/22/2012   Right T1c, N0  . C. difficile colitis   . Colitis, collagenous 09/22/2015  . Depression   . Essential hypertension, benign   . Fuchs' corneal dystrophy   . GERD (gastroesophageal reflux disease)   . Mitral valve prolapse    Mild mitral regurgitation  . Mixed hyperlipidemia   . Seizures ([place])    Only with dose of haldol; no more since that was stopped.    Past Surgical History:  Procedure Laterality Date  . ABDOMINAL HYSTERECTOMY  1999  . BIOPSY N/A 09/28/2015   Procedure: BIOPSY;  Surgeon: Danie Binder, MD;  Location: [facility];  Service: Endoscopy;  Laterality: N/A;  . BIOPSY  04/05/2016   Procedure: BIOPSY;  Surgeon: Danie Binder, MD;  [facility];  Service: Endoscopy;;  colon bx  . BREAST LUMPECTOMY  10/30/2002   right  . Carpal tunnel R/L  1992  . Cervical spine fusion X2 since last visit    . COLONOSCOPY WITH PROPOFOL N/A 09/28/2015   Procedure: COLONOSCOPY WITH PROPOFOL;  Surgeon: Danie Binder, MD;  Location: [facility];  Service: Endoscopy;  Laterality: N/A;  procedure 1  cecum time in 1232  time out 1246   total time 14 mintes  . COLONOSCOPY WITH PROPOFOL N/A 04/05/2016   Procedure: COLONOSCOPY WITH PROPOFOL;  Surgeon: Danie Binder, MD;  [facility];  Service: Endoscopy;  Laterality: N/A;  1200  . 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 ELBOW SURGERY Right  tennis elbow release 2012  . ESOPHAGOGASTRODUODENOSCOPY  10/26/2012   Procedure: ESOPHAGOGASTRODUODENOSCOPY (EGD);  Surgeon: Inda Castle, MD;  Location: [facility];  Service: Endoscopy;  Laterality: N/A;  . ESOPHAGOGASTRODUODENOSCOPY (EGD) WITH PROPOFOL N/A 09/28/2015   Procedure: ESOPHAGOGASTRODUODENOSCOPY (EGD) WITH PROPOFOL;  Surgeon: Danie Binder, MD;  [facility];  Service: Endoscopy;  Laterality: N/A;   . KNEE ARTHROSCOPY Left   . LEFT HEART CATHETERIZATION WITH CORONARY ANGIOGRAM N/A 10/23/2012   Procedure: LEFT HEART CATHETERIZATION WITH CORONARY ANGIOGRAM;  Surgeon: Peter M Martinique, MD;  Location: [facility] CATH LAB;  Service: Cardiovascular;  Laterality: N/A;  . OVARIAN CYST REMOVAL     prior to hysterectomy, right  . Ray cage fusion  1997  . SENTINEL LYMPH NODE BIOPSY  10/30/2002   right    Family Psychiatric History: none  Family History:  Family History  Problem Relation Age of Onset  . Diabetes Mother   . Pancreatic cancer Mother   . Lung cancer Father   . Diabetes Father   . Heart disease Father   . Esophageal cancer Father 28       Beer drinker  . Brain cancer Brother 29  . Brain cancer Maternal Grandfather   . Heart disease Paternal Grandfather   . Cancer Maternal Uncle        unknown form of cancer; cigar smoker  . Lung cancer Maternal Grandmother   . Brain cancer Cousin        dx in his late 87s-40s  . Schizophrenia Sister   . Anesthesia problems Neg Hx   . Hypotension Neg Hx   . Malignant hyperthermia Neg Hx   . Pseudochol deficiency Neg Hx   . Colon cancer Neg Hx   . Colon polyps Neg Hx   . Crohn's disease Neg Hx   . Ulcerative colitis Neg Hx     Social History:  Social History   Socioeconomic History  . Marital status: Widowed    Spouse name: Not on file  . Number of children: 2  . Years of education: Not on file  . Highest education level: Not on file  Occupational History  . Occupation: Aeronautical engineer: [employer]    Comment: [place]  . Financial resource strain: Not on file  . Food insecurity:    Worry: Not on file    Inability: Not on file  . Transportation needs:    Medical: Not on file    Non-medical: Not on file  Tobacco Use  . Smoking status: Current Every Day Smoker    Packs/day: 0.50    Years: 35.00    Pack years: 17.50    Types: Cigarettes    Start date: 07/07/1974  . Smokeless tobacco: Never Used  Substance  and Sexual Activity  . Alcohol use: Yes    Alcohol/week: 0.0 standard drinks    Comment: Rare( stopped July 26, 2014)  . Drug use: No  . Sexual activity: Never  Lifestyle  . Physical activity:    Days per week: Not on file    Minutes per session: Not on file  . Stress: Not  on file  Relationships  . Social connections:    Talks on phone: Not on file    Gets together: Not on file    Attends religious service: Not on file    Active member of club or organization: Not on file    Attends meetings of clubs or organizations: Not on file    Relationship status: Not on file  Other Topics Concern  . Not on file  Social History Narrative  . Not on file    Allergies:  Allergies  Allergen Reactions  . Bee Venom Anaphylaxis  . Haloperidol Lactate Other (See Comments)    Convulsions   . Meperidine Hcl Hives  . Xifaxan [Rifaximin] Other (See Comments)    Multiple complaints     Metabolic Disorder Labs: No results found for: HGBA1C, MPG No results found for: PROLACTIN Lab Results  Component Value Date   CHOL 207 (H) 10/23/2012   TRIG 319 (H) 10/23/2012   HDL 33 (L) 10/23/2012   CHOLHDL 6.3 10/23/2012   VLDL 64 (H) 10/23/2012   LDLCALC 110 (H) 10/23/2012   Lab Results  Component Value Date   TSH 0.706 09/22/2015    Therapeutic Level Labs: No results found for: LITHIUM No results found for: VALPROATE No components found for:  CBMZ  Current Medications: Current Outpatient Medications  Medication Sig Dispense Refill  . Calcium-Magnesium-Vitamin D (CALCIUM 500 PO) Take 1 tablet by mouth 2 (two) times daily.    Marland Kitchen EPINEPHrine (EPI-PEN) 0.3 mg/0.3 mL DEVI Inject 0.3 mg into the muscle as needed. For bee venom    . fish oil-omega-3 fatty acids 1000 MG capsule Take 2 g by mouth every morning.    Marland Kitchen FOLIC ACID PO Take 1 mg by mouth 2 (two) times daily.     Marland Kitchen golimumab (SIMPONI ARIA) 50 MG/4ML SOLN injection Inject into the vein every 8 (eight) weeks.    . hydrochlorothiazide  (MICROZIDE) 12.5 MG capsule TAKE 1 CAPSULE DAILY 90 capsule 3  . metoprolol succinate (TOPROL-XL) 50 MG 24 hr tablet TAKE 1 TABLET TWICE A DAY  WITH OR IMMEDIATELY        FOLLOWING A MEAL. (DOSE    INCREASED) 180 tablet 3  . Multiple Vitamin (MULITIVITAMIN WITH MINERALS) TABS Take 1 tablet by mouth every morning.    Marland Kitchen NIFEdipine (PROCARDIA XL/ADALAT-CC) 90 MG 24 hr tablet TAKE 1 TABLET DAILY 90 tablet 3  . nitroGLYCERIN (NITROSTAT) 0.4 MG SL tablet Place 1 tablet (0.4 mg total) under the tongue every 5 (five) minutes x 3 doses as needed for chest pain. 25 tablet 3  . pantoprazole (PROTONIX) 40 MG tablet TAKE 1 TABLET DAILY 30 tablet 6  . Probiotic Product (PHILLIPS COLON HEALTH PO) Take 1 capsule by mouth daily.    . simvastatin (ZOCOR) 40 MG tablet Take 40 mg by mouth every morning.     . sodium chloride (MURO 128) 5 % ophthalmic solution Place 1 drop into both eyes every 3 (three) hours as needed.     . ALPRAZolam (XANAX) 0.5 MG tablet Take 1 tablet (0.5 mg total) by mouth daily. 30 tablet 2   No current facility-administered medications for this visit.      Musculoskeletal: Strength & Muscle Tone: within normal limits Gait & Station: normal Patient leans: N/A  Psychiatric Specialty Exam: Review of Systems  Musculoskeletal: Positive for joint pain.  All other systems reviewed and are negative.   Blood pressure 130/82, pulse 66, height 5' 7"  (1.702 m), weight 181 lb (  82.1 kg), SpO2 96 %.Body mass index is 28.35 kg/m.  General Appearance: Casual, Neat and Well Groomed  Eye Contact:  Good  Speech:  Clear and Coherent  Volume:  Normal  Mood:  Euthymic  Affect:  Congruent  Thought Process:  Goal Directed  Orientation:  Full (Time, Place, and Person)  Thought Content: WDL   Suicidal Thoughts:  No  Homicidal Thoughts:  No  Memory:  Immediate;   Good Recent;   Good Remote;   Good  Judgement:  Good  Insight:  Good  Psychomotor Activity:  Normal  Concentration:  Concentration:  Good and Attention Span: Good  Recall:  Good  Fund of Knowledge: Good  Language: Good  Akathisia:  No  Handed:  Right  AIMS (if indicated): not done  Assets:  Communication Skills Desire for Improvement Resilience Social Support Talents/Skills Vocational/Educational  ADL's:  Intact  Cognition: WNL  Sleep:  Good   Screenings:   Assessment and Plan: This patient is a 57 year old female with a history of depression and anxiety.  At much of this was related to the death of her parents followed by the death of her first husband.  She is in much better place now and I think ultimately she can go off antidepressants.  She will decrease Paxil to 10 mg daily for 3 months and come back to the office for reevaluation.  She will continue Xanax 0.5 mg daily for anxiety.    Levonne Spiller, MD 09/24/2018, 8:43 AM

## 2018-12-22 ENCOUNTER — Other Ambulatory Visit: Payer: Self-pay | Admitting: Cardiology

## 2018-12-22 NOTE — Telephone Encounter (Signed)
Having trouble getting her meds refilled, please give pt a call [phone]

## 2018-12-23 MED ORDER — HYDROCHLOROTHIAZIDE 12.5 MG PO CAPS: ORAL_CAPSULE | ORAL | 3 refills | Status: DC

## 2018-12-23 MED ORDER — METOPROLOL SUCCINATE ER 50 MG PO TB24: ORAL_TABLET | ORAL | 3 refills | Status: DC

## 2018-12-23 NOTE — Telephone Encounter (Signed)
Refill sent to [facility].

## 2018-12-23 NOTE — Telephone Encounter (Signed)
Called pt, no answer, left msg to call back.

## 2018-12-25 ENCOUNTER — Ambulatory Visit (INDEPENDENT_AMBULATORY_CARE_PROVIDER_SITE_OTHER): Payer: Commercial Managed Care - PPO | Admitting: Psychiatry

## 2018-12-25 ENCOUNTER — Encounter (HOSPITAL_COMMUNITY): Payer: Self-pay | Admitting: Psychiatry

## 2018-12-25 VITALS — BP 134/81 | HR 109 | Ht 67.0 in | Wt 184.0 lb

## 2018-12-25 DIAGNOSIS — F322 Major depressive disorder, single episode, severe without psychotic features: Secondary | ICD-10-CM | POA: Diagnosis not present

## 2018-12-25 MED ORDER — ALPRAZOLAM 0.5 MG PO TABS: 0.5000 mg | ORAL_TABLET | Freq: Every day | ORAL | 2 refills | Status: DC

## 2018-12-25 NOTE — Progress Notes (Signed)
[place] MD/PA/NP OP Progress Note  12/25/2018 8:42 AM Rachel Sutton  MRN:  [id number]  Chief Complaint:  Chief Complaint    Anxiety; Depression; Follow-up     HPI: this patient is a 58 year old recently remarried white female is currently living near [place].Marland Kitchen She works as a Engineer, production for [employer]   The patient was referred by her primary physician Dr. Marolyn Haller for further assessment and treatment of anxiety and depression.  The patient states that she does not really have any history of mental illness or treatment. She and her husband moved her parents here a few years ago because their health was declining.she took care of both of them. Her mother died in 04/07/2012 of pancreatic cancer and her father died 2 months later. During this time Dr. Luan Pulling prescribed clonazepam to help with anxiety. In January 2014 her husband was diagnosed with a myocardial infarction congestive heart failure and COPD. His health declined quickly and he died in 07-Sep-2014.  The patient returns after 3 months.  She is now on Paxil 10 mg daily as well as Xanax 0.5 mg.  She is doing well but still would like to get off the Paxil.  I suggest that she cut it down to 5 mg for couple of weeks and go to every other day and stop.  She has no symptoms of depression in the low-dose of Xanax helps her anxiety.  She is really liking her new married life doing well at work and not having any conflicts with family right now.  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  She is on a new drug but she does not remember the name of it.  She is generally sleeping well her energy is good and she is performing well at work.  She denies suicidal ideation Visit Diagnosis:    ICD-10-CM   1. Major depressive disorder, single episode, severe without psychotic features ([place]) F32.2     Past Psychiatric History: none  Past Medical History:  Past Medical History:  Diagnosis Date  .  Anxiety   . Arthritis   . Breast cancer ([place]) 02/22/2012   Right T1c, N0  . C. difficile colitis   . Colitis, collagenous 09/22/2015  . Depression   . Essential hypertension, benign   . Fuchs' corneal dystrophy   . GERD (gastroesophageal reflux disease)   . Mitral valve prolapse    Mild mitral regurgitation  . Mixed hyperlipidemia   . Seizures ([place])    Only with dose of haldol; no more since that was stopped.    Past Surgical History:  Procedure Laterality Date  . ABDOMINAL HYSTERECTOMY  1999  . BIOPSY N/A 09/28/2015   Procedure: BIOPSY;  Surgeon: Danie Binder, MD;  Location: [facility];  Service: Endoscopy;  Laterality: N/A;  . BIOPSY  04/05/2016   Procedure: BIOPSY;  Surgeon: Danie Binder, MD;  [facility];  Service: Endoscopy;;  colon bx  . BREAST LUMPECTOMY  10/30/2002   right  . Carpal tunnel R/L  1992  . Cervical spine fusion X2 since last visit    . COLONOSCOPY WITH PROPOFOL N/A 09/28/2015   Procedure: COLONOSCOPY WITH PROPOFOL;  Surgeon: Danie Binder, MD;  Location: [facility];  Service: Endoscopy;  Laterality: N/A;  procedure 1 cecum time in Apr 08, 1231  time out 1246   total time 14 mintes  . COLONOSCOPY WITH PROPOFOL N/A 04/05/2016   Procedure: COLONOSCOPY  WITH PROPOFOL;  Surgeon: Danie Binder, MD;  [facility];  Service: Endoscopy;  Laterality: N/A;  1200  . 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 ELBOW SURGERY Right  tennis elbow release 2012  . ESOPHAGOGASTRODUODENOSCOPY  10/26/2012   Procedure: ESOPHAGOGASTRODUODENOSCOPY (EGD);  Surgeon: Inda Castle, MD;  Location: [place];  Service: Endoscopy;  Laterality: N/A;  . ESOPHAGOGASTRODUODENOSCOPY (EGD) WITH PROPOFOL N/A 09/28/2015   Procedure: ESOPHAGOGASTRODUODENOSCOPY (EGD) WITH PROPOFOL;  Surgeon: Danie Binder, MD;  Location: [facility];  Service: Endoscopy;  Laterality: N/A;  . KNEE ARTHROSCOPY Left   . LEFT HEART CATHETERIZATION WITH CORONARY ANGIOGRAM N/A 10/23/2012   Procedure: LEFT HEART  CATHETERIZATION WITH CORONARY ANGIOGRAM;  Surgeon: Peter M Martinique, MD;  Location: [facility] CATH LAB;  Service: Cardiovascular;  Laterality: N/A;  . OVARIAN CYST REMOVAL     prior to hysterectomy, right  . Ray cage fusion  1997  . SENTINEL LYMPH NODE BIOPSY  10/30/2002   right    Family Psychiatric History: See below  Family History:  Family History  Problem Relation Age of Onset  . Diabetes Mother   . Pancreatic cancer Mother   . Lung cancer Father   . Diabetes Father   . Heart disease Father   . Esophageal cancer Father 2       Beer drinker  . Brain cancer Brother 61  . Brain cancer Maternal Grandfather   . Heart disease Paternal Grandfather   . Cancer Maternal Uncle        unknown form of cancer; cigar smoker  . Lung cancer Maternal Grandmother   . Brain cancer Cousin        dx in his late 34s-40s  . Schizophrenia Sister   . Anesthesia problems Neg Hx   . Hypotension Neg Hx   . Malignant hyperthermia Neg Hx   . Pseudochol deficiency Neg Hx   . Colon cancer Neg Hx   . Colon polyps Neg Hx   . Crohn's disease Neg Hx   . Ulcerative colitis Neg Hx     Social History:  Social History   Socioeconomic History  . Marital status: Widowed    Spouse name: Not on file  . Number of children: 2  . Years of education: Not on file  . Highest education level: Not on file  Occupational History  . 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 Financial resource strain: Not on file  . Food insecurity:    Worry: Not on file    Inability: Not on file  . Transportation needs:    Medical: Not on file    Non-medical: Not on file  Tobacco Use  . Smoking status: Current Every Day Smoker    Packs/day: 0.50    Years: 35.00    Pack years: 17.50    Types: Cigarettes    Start date: 07/07/1974  . Smokeless tobacco: Never Used  Substance and Sexual Activity  . Alcohol use: Yes    Alcohol/week: 0.0 standard drinks    Comment: Rare( stopped July 26, 2014)  . Drug use: No  . Sexual activity: Never  Lifestyle  . Physical activity:    Days per week: Not on file    Minutes per session: Not on file  . Stress: Not on file  Relationships  . Social connections:    Talks on phone: Not on file    Gets together: Not on  file    Attends religious service: Not on file    Active member of club or organization: Not on file    Attends meetings of clubs or organizations: Not on file    Relationship status: Not on file  Other Topics Concern  . Not on file  Social History Narrative  . Not on file    Allergies:  Allergies  Allergen Reactions  . Bee Venom Anaphylaxis  . Haloperidol Lactate Other (See Comments)    Convulsions   . Meperidine Hcl Hives  . Xifaxan [Rifaximin] Other (See Comments)    Multiple complaints     Metabolic Disorder Labs: No results found for: HGBA1C, MPG No results found for: PROLACTIN Lab Results  Component Value Date   CHOL 207 (H) 10/23/2012   TRIG 319 (H) 10/23/2012   HDL 33 (L) 10/23/2012   CHOLHDL 6.3 10/23/2012   VLDL 64 (H) 10/23/2012   LDLCALC 110 (H) 10/23/2012   Lab Results  Component Value Date   TSH 0.706 09/22/2015    Therapeutic Level Labs: No results found for: LITHIUM No results found for: VALPROATE No components found for:  CBMZ  Current Medications: Current Outpatient Medications  Medication Sig Dispense Refill  . ALPRAZolam (XANAX) 0.5 MG tablet Take 1 tablet (0.5 mg total) by mouth daily. 90 tablet 2  . Calcium-Magnesium-Vitamin D (CALCIUM 500 PO) Take 1 tablet by mouth 2 (two) times daily.    Marland Kitchen EPINEPHrine (EPI-PEN) 0.3 mg/0.3 mL DEVI Inject 0.3 mg into the muscle as needed. For bee venom    . fish oil-omega-3 fatty acids 1000 MG capsule Take 2 g by mouth every morning.    Marland Kitchen FOLIC ACID PO Take 1 mg by mouth 2 (two) times daily.     . hydrochlorothiazide (MICROZIDE) 12.5 MG capsule TAKE 1 CAPSULE DAILY 90 capsule 3  . metoprolol succinate (TOPROL-XL) 50 MG 24 hr tablet TAKE 1  TABLET TWICE A DAY  WITH OR IMMEDIATELY        FOLLOWING A MEAL. 180 tablet 3  . Multiple Vitamin (MULITIVITAMIN WITH MINERALS) TABS Take 1 tablet by mouth every morning.    Marland Kitchen NIFEdipine (PROCARDIA XL/ADALAT-CC) 90 MG 24 hr tablet TAKE 1 TABLET DAILY 90 tablet 3  . nitroGLYCERIN (NITROSTAT) 0.4 MG SL tablet Place 1 tablet (0.4 mg total) under the tongue every 5 (five) minutes x 3 doses as needed for chest pain. 25 tablet 3  . pantoprazole (PROTONIX) 40 MG tablet TAKE 1 TABLET DAILY 30 tablet 6  . Probiotic Product (PHILLIPS COLON HEALTH PO) Take 1 capsule by mouth daily.    . simvastatin (ZOCOR) 40 MG tablet Take 40 mg by mouth every morning.     . sodium chloride (MURO 128) 5 % ophthalmic solution Place 1 drop into both eyes every 3 (three) hours as needed.      No current facility-administered medications for this visit.      Musculoskeletal: Strength & Muscle Tone: within normal limits Gait & Station: normal Patient leans: N/A  Psychiatric Specialty Exam: Review of Systems  Musculoskeletal: Positive for joint pain.  All other systems reviewed and are negative.   Blood pressure 134/81, pulse (!) 109, height 5\' 7"  (1.702 m), weight 184 lb (83.5 kg), SpO2 95 %.Body mass index is 28.82 kg/m.  General Appearance: Casual, Neat and Well Groomed  Eye Contact:  Good  Speech:  Clear and Coherent  Volume:  Normal  Mood:  Euthymic  Affect:  Congruent  Thought Process:  Goal Directed  Orientation:  Full (Time, Place, and Person)  Thought Content: WDL   Suicidal Thoughts:  No  Homicidal Thoughts:  No  Memory:  Immediate;   Good Recent;   Good Remote;   Good  Judgement:  Good  Insight:  Good  Psychomotor Activity:  Normal  Concentration:  Concentration: Good and Attention Span: Good  Recall:  Good  Fund of Knowledge: Good  Language: Good  Akathisia:  No  Handed:  Right  AIMS (if indicated): not done  Assets:  Communication Skills Desire for Improvement Physical  Health Resilience Social Support Talents/Skills Vocational/Educational  ADL's:  Intact  Cognition: WNL  Sleep:  Good   Screenings:   Assessment and Plan: This patient is a 58 year old female with a history of depression and anxiety.  She is doing quite well now.  She will taper off Paxil as directed and continue Xanax 0.5 mg daily for anxiety.  She will return to see me in 6 months or call sooner if needed   Levonne Spiller, MD 12/25/2018, 8:42 AM

## 2019-02-26 ENCOUNTER — Other Ambulatory Visit: Payer: Self-pay | Admitting: Cardiology

## 2019-04-28 ENCOUNTER — Telehealth: Payer: Self-pay | Admitting: Cardiology

## 2019-04-28 NOTE — Telephone Encounter (Signed)
Virtual Visit Pre-Appointment Phone Call  "(Name), I am calling you today to discuss your upcoming appointment. We are currently trying to limit exposure to the virus that causes COVID-19 by seeing patients at home rather than in the office."  1. "What is the BEST phone number to call the day of the visit?" - include this in appointment notes  2. Do you have or have access to (through a family member/friend) a smartphone with video capability that we can use for your visit?" a. If yes - list this number in appt notes as cell (if different from BEST phone #) and list the appointment type as a VIDEO visit in appointment notes b. If no - list the appointment type as a PHONE visit in appointment notes  Confirm consent - "In the setting of the current Covid19 crisis, you are scheduled for a (phone or video) visit with your provider on (date) at (time).  Just as we do with many in-office visits, in order for you to participate in this visit, we must obtain consent.  If you'd like, I can send this to your mychart (if signed up) or email for you to review.  Otherwise, I can obtain your verbal consent now.  All virtual visits are billed to your insurance company just like a normal visit would be.  By agreeing to a virtual visit, we'd like you to understand that the technology does not allow for your provider to perform an examination, and thus may limit your provider's ability to fully assess your condition. If your provider identifies any concerns that need to be evaluated in person, we will make arrangements to do so.  Finally, though the technology is pretty good, we cannot assure that it will always work on either your or our end, and in the setting of a video visit, we may have to convert it to a phone-only visit.  In either situation, we cannot ensure that we have a secure connection.  Are you willing to proceed?" STAFF: Did the patient verbally acknowledge consent to telehealth visit? Document  YES/NO here: Yes  3. Advise patient to be prepared - "Two hours prior to your appointment, go ahead and check your blood pressure, pulse, oxygen saturation, and your weight (if you have the equipment to check those) and write them all down. When your visit starts, your provider will ask you for this information. If you have an Apple Watch or Kardia device, please plan to have heart rate information ready on the day of your appointment. Please have a pen and paper handy nearby the day of the visit as well."  4. Give patient instructions for MyChart download to smartphone OR [email or web address] as below if video visit (depending on what platform provider is using)  5. Inform patient they will receive a phone call 15 minutes prior to their appointment time (may be from unknown caller ID) so they should be prepared to answer    TELEPHONE CALL NOTE  KILEEN LANGE has been deemed a candidate for a follow-up tele-health visit to limit community exposure during the Covid-19 pandemic. I spoke with the patient via phone to ensure availability of phone/video source, confirm preferred email & phone number, and discuss instructions and expectations.  I reminded BRIAWNA CARVER to be prepared with any vital sign and/or heart rhythm information that could potentially be obtained via home monitoring, at the time of her visit. I reminded KORALYN PRESTAGE to expect a phone call prior to her  visit.  Bertram Gala Goins 04/28/2019 10:49 AM

## 2019-05-04 NOTE — Progress Notes (Signed)
Virtual Visit via Video Note   This visit type was conducted due to national recommendations for restrictions regarding the COVID-19 Pandemic (e.g. social distancing) in an effort to limit this patient's exposure and mitigate transmission in our community.  Due to her co-morbid illnesses, this patient is at least at moderate risk for complications without adequate follow up.  This format is felt to be most appropriate for this patient at this time.  All issues noted in this document were discussed and addressed.  A limited physical exam was performed with this format.  Please refer to the patient's chart for her consent to telehealth for [facility].   Date:  05/05/2019   ID:  Rachel Sutton, DOB Aug 02, 1961, MRN [id number]  Patient Location: Home Provider Location: Office  PCP:  Rachel Morning, DO  Cardiologist:  Rozann Lesches, MD  Evaluation Performed:  Follow-Up Visit  Chief Complaint:   Cardiac follow-up  History of Present Illness:    Rachel Sutton is a 58 y.o. female last seen in May 2019.  We communicated by video conferencing today.  No major changes since I last saw her from a cardiac perspective.  Had no significant palpitations, no dizziness or syncope.  I reviewed her medications which are outlined below.  Cardiac regimen has been stable.  She remains on Toprol-XL.  She has switched PCP due to Dr. Luan Pulling' pending retirement.  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  She has been watching her diet and has actually been able to lose 10 pounds.  The patient does not have symptoms concerning for COVID-19 infection (fever, chills, cough, or new shortness of breath).    Past Medical History:  Diagnosis Date  . Anxiety   . Arthritis   . Breast cancer ([place]) 02/22/2012   Right T1c, N0  . C. difficile colitis   . Colitis, collagenous 09/22/2015  . Depression   . Essential hypertension, benign   . Fuchs' corneal  dystrophy   . GERD (gastroesophageal reflux disease)   . Mitral valve prolapse    Mild mitral regurgitation  . Mixed hyperlipidemia   . Seizures ([place])    Only with dose of haldol; no more since that was stopped.   Past Surgical History:  Procedure Laterality Date  . ABDOMINAL HYSTERECTOMY  1999  . BIOPSY N/A 09/28/2015   Procedure: BIOPSY;  Surgeon: Danie Binder, MD;  [facility];  Service: Endoscopy;  Laterality: N/A;  . BIOPSY  04/05/2016   Procedure: BIOPSY;  Surgeon: Danie Binder, MD;  [facility];  Service: Endoscopy;;  colon bx  . BREAST LUMPECTOMY  10/30/2002   right  . Carpal tunnel R/L  1992  . Cervical spine fusion X2 since last visit    . COLONOSCOPY WITH PROPOFOL N/A 09/28/2015   Procedure: COLONOSCOPY WITH PROPOFOL;  Surgeon: Danie Binder, MD;  Location: [facility];  Service: Endoscopy;  Laterality: N/A;  procedure 1 cecum time in 1232  time out 1246   total time 14 mintes  . COLONOSCOPY WITH PROPOFOL N/A 04/05/2016   Procedure: COLONOSCOPY WITH PROPOFOL;  Surgeon: Danie Binder, MD;  [facility];  Service: Endoscopy;  Laterality: N/A;  1200  . 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 ELBOW SURGERY Right  tennis elbow release 2012  . ESOPHAGOGASTRODUODENOSCOPY  10/26/2012   Procedure: ESOPHAGOGASTRODUODENOSCOPY (EGD);  Surgeon: Inda Castle, MD;  Location: [place];  Service: Endoscopy;  Laterality: N/A;  . ESOPHAGOGASTRODUODENOSCOPY (EGD) WITH PROPOFOL N/A 09/28/2015   Procedure: ESOPHAGOGASTRODUODENOSCOPY (EGD) WITH PROPOFOL;  Surgeon: Danie Binder, MD;  [facility];  Service: Endoscopy;  Laterality: N/A;  . KNEE ARTHROSCOPY Left   . LEFT HEART CATHETERIZATION WITH CORONARY ANGIOGRAM N/A 10/23/2012   Procedure: LEFT HEART CATHETERIZATION WITH CORONARY ANGIOGRAM;  Surgeon: Peter M Martinique, MD;  Location: [facility] CATH LAB;  Service: Cardiovascular;  Laterality: N/A;  . OVARIAN CYST REMOVAL     prior to hysterectomy, right  . Ray  cage fusion  1997  . SENTINEL LYMPH NODE BIOPSY  10/30/2002   right     Current Meds  Medication Sig  . ALPRAZolam (XANAX) 0.5 MG tablet Take 1 tablet (0.5 mg total) by mouth daily.  . Calcium-Magnesium-Vitamin D (CALCIUM 500 PO) Take 1 tablet by mouth 2 (two) times daily.  . Certolizumab Pegol (CIMZIA PREFILLED [place]) Inject 2 Doses into the skin every 30 (thirty) days.  . diclofenac (VOLTAREN) 75 MG EC tablet Take 1 tablet by mouth daily as needed.  . diclofenac sodium (VOLTAREN) 1 % GEL Apply 1 application topically 3 (three) times daily as needed.  Marland Kitchen EPINEPHrine (EPI-PEN) 0.3 mg/0.3 mL DEVI Inject 0.3 mg into the muscle as needed. For bee venom  . fish oil-omega-3 fatty acids 1000 MG capsule Take 2 g by mouth every Sutton.  Marland Kitchen FOLIC ACID PO Take 1 mg by mouth 2 (two) times daily.   . hydrochlorothiazide (MICROZIDE) 12.5 MG capsule TAKE 1 CAPSULE DAILY  . metoprolol succinate (TOPROL-XL) 50 MG 24 hr tablet TAKE 1 TABLET TWICE A DAY  WITH OR IMMEDIATELY        FOLLOWING A MEAL.  . Multiple Vitamin (MULITIVITAMIN WITH MINERALS) TABS Take 1 tablet by mouth every Sutton.  Marland Kitchen NIFEdipine (PROCARDIA XL/NIFEDICAL-XL) 90 MG 24 hr tablet TAKE 1 TABLET DAILY  . nitroGLYCERIN (NITROSTAT) 0.4 MG SL tablet Place 1 tablet (0.4 mg total) under the tongue every 5 (five) minutes x 3 doses as needed for chest pain.  . pantoprazole (PROTONIX) 40 MG tablet TAKE 1 TABLET DAILY  . Probiotic Product (PHILLIPS COLON HEALTH PO) Take 1 capsule by mouth daily.  . sodium chloride (MURO 128) 5 % ophthalmic solution Place 1 drop into both eyes every 3 (three) hours as needed.      Allergies:   Bee venom; Haloperidol lactate; Meperidine hcl; and Xifaxan [rifaximin]   Social History   Tobacco Use  . Smoking status: Current Every Day Smoker    Packs/day: 0.50    Years: 35.00    Pack years: 17.50    Types: Cigarettes    Start date: 07/07/1974  . Smokeless tobacco: Never Used  Substance Use Topics  . Alcohol use:  Yes    Alcohol/week: 0.0 standard drinks    Comment: Rare( stopped July 26, 2014)  . Drug use: No     Family Hx: The patient's family history includes Brain cancer in her cousin and maternal grandfather; Brain cancer (age of onset: 41) in her brother; Cancer in her maternal uncle; Diabetes in her father and mother; Esophageal cancer (age of onset: 66) in her father; Heart disease in her father and paternal grandfather; Lung cancer in her father and maternal grandmother; Pancreatic cancer in her mother; Schizophrenia in her sister. There is no history of Anesthesia problems, Hypotension, Malignant hyperthermia, Pseudochol deficiency, Colon cancer, Colon polyps, Crohn's disease, or Ulcerative colitis.  ROS:   Please see the history of present illness.  Stable arthritic symptoms. All other systems reviewed and are negative.   Prior CV studies:   The following studies were reviewed today:  Echocardiogram 10/10/2017: Study Conclusions  - Left ventricle: The cavity size was normal. Wall thickness was normal. Systolic function was normal. The estimated ejection fraction was in the range of 60% to 65%. Wall motion was normal; there were no regional wall motion abnormalities. Doppler parameters are consistent with abnormal left ventricular relaxation (grade 1 diastolic dysfunction). - Mitral valve: Mild bileaflet prolapse with mild central regurgitation.  Labs/Other Tests and Data Reviewed:    EKG:  An ECG dated 10/08/2017 was personally reviewed today and demonstrated:  Normal sinus rhythm.  Recent Labs:  April 2017: Potassium 4.4, BUN 15, creatinine 0.7, hemoglobin 12.6, platelets 389  Wt Readings from Last 3 Encounters:  05/05/19 184 lb (83.5 kg)  04/17/18 182 lb (82.6 kg)  10/08/17 182 lb (82.6 kg)     Objective:    Vital Signs:  BP 130/80   Temp 97.8 F (36.6 C)   Ht 5\' 4"  (1.626 m)   Wt 184 lb (83.5 kg)   BMI 31.58 kg/m    General: Patient appears  comfortable at rest at her home. HEENT: Conjunctiva and lids normal. Lungs: Patient spoke in full sentences, no shortness of breath.  No audible wheezing. Skin: Normal appearance of color and turgor. Neuropsychiatric: Gaze conjugate.  Speech pattern normal.  Patient moves all extremities and was observed to walk.  Affect appropriate.  ASSESSMENT & PLAN:    1.  Mitral valve prolapse, symptomatically stable, no palpitations or syncope.  Last echocardiogram was in 2018 as outlined above.  2.  Essential hypertension, recent systolic in the 240X.  She has established with a new PCP.  3.  Mixed hyperlipidemia on Zocor.  COVID-19 Education: The signs and symptoms of COVID-19 were discussed with the patient and how to seek care for testing (follow up with PCP or arrange E-visit).  The importance of social distancing was discussed today.  Time:   Today, I have spent 8 minutes with the patient with telehealth technology discussing the above problems.     Medication Adjustments/Labs and Tests Ordered: Current medicines are reviewed at length with the patient today.  Concerns regarding medicines are outlined above.   Tests Ordered: No orders of the defined types were placed in this encounter.   Medication Changes: No orders of the defined types were placed in this encounter.   Disposition:  Follow up 1 year in the [place] office.  Signed, Rozann Lesches, MD  05/05/2019 2:19 PM    [facility]

## 2019-05-05 ENCOUNTER — Encounter: Payer: Self-pay | Admitting: Cardiology

## 2019-05-05 ENCOUNTER — Telehealth (INDEPENDENT_AMBULATORY_CARE_PROVIDER_SITE_OTHER): Payer: Commercial Managed Care - PPO | Admitting: Cardiology

## 2019-05-05 VITALS — BP 130/80 | Temp 97.8°F | Ht 64.0 in | Wt 184.0 lb

## 2019-05-05 DIAGNOSIS — I341 Nonrheumatic mitral (valve) prolapse: Secondary | ICD-10-CM

## 2019-05-05 DIAGNOSIS — Z7189 Other specified counseling: Secondary | ICD-10-CM

## 2019-05-05 DIAGNOSIS — I10 Essential (primary) hypertension: Secondary | ICD-10-CM | POA: Diagnosis not present

## 2019-05-05 DIAGNOSIS — E782 Mixed hyperlipidemia: Secondary | ICD-10-CM | POA: Diagnosis not present

## 2019-05-05 NOTE — Patient Instructions (Addendum)

## 2019-06-25 ENCOUNTER — Encounter (HOSPITAL_COMMUNITY): Payer: Self-pay | Admitting: Psychiatry

## 2019-06-25 ENCOUNTER — Ambulatory Visit (INDEPENDENT_AMBULATORY_CARE_PROVIDER_SITE_OTHER): Payer: Commercial Managed Care - PPO | Admitting: Psychiatry

## 2019-06-25 ENCOUNTER — Other Ambulatory Visit: Payer: Self-pay

## 2019-06-25 DIAGNOSIS — F322 Major depressive disorder, single episode, severe without psychotic features: Secondary | ICD-10-CM | POA: Diagnosis not present

## 2019-06-25 MED ORDER — ALPRAZOLAM 0.5 MG PO TABS: 0.5000 mg | ORAL_TABLET | Freq: Every day | ORAL | 2 refills | Status: DC

## 2019-06-25 NOTE — Progress Notes (Signed)
Virtual Visit via Video Note  I connected with Rachel Sutton on 06/25/19 at  8:20 AM EDT by a video enabled telemedicine application and verified that I am speaking with the correct person using two identifiers.   I discussed the limitations of evaluation and management by telemedicine and the availability of in person appointments. The patient expressed understanding and agreed to proceed.    I discussed the assessment and treatment plan with the patient. The patient was provided an opportunity to ask questions and all were answered. The patient agreed with the plan and demonstrated an understanding of the instructions.   The patient was advised to call back or seek an in-person evaluation if the symptoms worsen or if the condition fails to improve as anticipated.  I provided 15 minutes of non-face-to-face time during this encounter.   Levonne Spiller, MD  [facility] MD/PA/NP OP Progress Note  06/25/2019 8:40 AM Rachel Sutton  MRN:  [id number]  Chief Complaint:  Chief Complaint    Depression; Anxiety; Follow-up     HPI: this patient is a 58 year oldrecently remarried white female is currently living in [place].Marland Kitchen She works as a Engineer, production for [employer]   The patient was referred by her primary physician Dr. Marolyn Haller for further assessment and treatment of anxiety and depression.  The patient states that she does not really have any history of mental illness or treatment. She and her husband moved her parents here a few years ago because their health was declining.she took care of both of them. Her mother died in 03/13/12 of pancreatic cancer and her father died 32 months later. During this time Dr. Luan Pulling prescribed clonazepam to help with anxiety. In January 2014 her husband was diagnosed with a myocardial infarction congestive heart failure and COPD. His health declined quickly and he died in 08/13/2014.  The patient returns for follow-up after 6 months.  She is still living in  [place] with her husband.  She is very happy there.  She is completely gotten off Paxil and did it slowly so she had no side effects.  She states that she feels "great."  She is still working from home due to the coronavirus pandemic.  She states that she is overall doing well.  She has reconciled with her daughter.  She still has rheumatoid arthritis flareups but other than that her health has been good.  She is only taking Xanax 0.5 mg daily and she thinks this helps with her anxiety. Visit Diagnosis:    ICD-10-CM   1. Major depressive disorder, single episode, severe without psychotic features ([place])  F32.2     Past Psychiatric History: none  Past Medical History:  Past Medical History:  Diagnosis Date  . Anxiety   . Arthritis   . Breast cancer ([place]) 02/22/2012   Right T1c, N0  . C. difficile colitis   . Colitis, collagenous 09/22/2015  . Depression   . Essential hypertension, benign   . Fuchs' corneal dystrophy   . GERD (gastroesophageal reflux disease)   . Mitral valve prolapse    Mild mitral regurgitation  . Mixed hyperlipidemia   . Seizures ([place])    Only with dose of haldol; no more since that was stopped.    Past Surgical History:  Procedure Laterality Date  . ABDOMINAL HYSTERECTOMY  1999  . BIOPSY N/A 09/28/2015   Procedure: BIOPSY;  Surgeon: Danie Binder, MD;  [facility];  Service: Endoscopy;  Laterality: N/A;  . BIOPSY  04/05/2016  Procedure: BIOPSY;  Surgeon: Danie Binder, MD;  [facility];  Service: Endoscopy;;  colon bx  . BREAST LUMPECTOMY  10/30/2002   right  . Carpal tunnel R/L  1992  . Cervical spine fusion X2 since last visit    . COLONOSCOPY WITH PROPOFOL N/A 09/28/2015   Procedure: COLONOSCOPY WITH PROPOFOL;  Surgeon: Danie Binder, MD;  Location: [facility];  Service: Endoscopy;  Laterality: N/A;  procedure 1 cecum time in 1232  time out 1246   total time 14 mintes  . COLONOSCOPY WITH PROPOFOL N/A 04/05/2016   Procedure: COLONOSCOPY WITH  PROPOFOL;  Surgeon: Danie Binder, MD;  [facility];  Service: Endoscopy;  Laterality: N/A;  1200  . 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 ELBOW SURGERY Right  tennis elbow release 2012  . ESOPHAGOGASTRODUODENOSCOPY  10/26/2012   Procedure: ESOPHAGOGASTRODUODENOSCOPY (EGD);  Surgeon: Inda Castle, MD;  Location: [place];  Service: Endoscopy;  Laterality: N/A;  . ESOPHAGOGASTRODUODENOSCOPY (EGD) WITH PROPOFOL N/A 09/28/2015   Procedure: ESOPHAGOGASTRODUODENOSCOPY (EGD) WITH PROPOFOL;  Surgeon: Danie Binder, MD;  [facility];  Service: Endoscopy;  Laterality: N/A;  . KNEE ARTHROSCOPY Left   . LEFT HEART CATHETERIZATION WITH CORONARY ANGIOGRAM N/A 10/23/2012   Procedure: LEFT HEART CATHETERIZATION WITH CORONARY ANGIOGRAM;  Surgeon: Peter M Martinique, MD;  Location: [facility] CATH LAB;  Service: Cardiovascular;  Laterality: N/A;  . OVARIAN CYST REMOVAL     prior to hysterectomy, right  . Ray cage fusion  1997  . SENTINEL LYMPH NODE BIOPSY  10/30/2002   right    Family Psychiatric History: see below  Family History:  Family History  Problem Relation Age of Onset  . Diabetes Mother   . Pancreatic cancer Mother   . Lung cancer Father   . Diabetes Father   . Heart disease Father   . Esophageal cancer Father 75       Beer drinker  . Brain cancer Brother 56  . Brain cancer Maternal Grandfather   . Heart disease Paternal Grandfather   . Cancer Maternal Uncle        unknown form of cancer; cigar smoker  . Lung cancer Maternal Grandmother   . Brain cancer Cousin        dx in his late 56s-40s  . Schizophrenia Sister   . Anesthesia problems Neg Hx   . Hypotension Neg Hx   . Malignant hyperthermia Neg Hx   . Pseudochol deficiency Neg Hx   . Colon cancer Neg Hx   . Colon polyps Neg Hx   . Crohn's disease Neg Hx   . Ulcerative colitis Neg Hx     Social History:  Social History   Socioeconomic History  . Marital status: Widowed    Spouse name: Not on file  .  Number of children: 2  . Years of education: Not on file  . Highest education level: Not on file  Occupational History  . Occupation: Aeronautical engineer: [employer]    Comment: [place]  . Financial resource strain: Not on file  . Food insecurity    Worry: Not on file    Inability: Not on file  . Transportation needs    Medical: Not on file    Non-medical: Not on file  Tobacco Use  . Smoking status: Current Every Day Smoker    Packs/day: 0.50    Years: 35.00    Pack years: 17.50  Types: Cigarettes    Start date: 07/07/1974  . Smokeless tobacco: Never Used  Substance and Sexual Activity  . Alcohol use: Yes    Alcohol/week: 0.0 standard drinks    Comment: Rare( stopped July 26, 2014)  . Drug use: No  . Sexual activity: Never  Lifestyle  . Physical activity    Days per week: Not on file    Minutes per session: Not on file  . Stress: Not on file  Relationships  . Social Herbalist on phone: Not on file    Gets together: Not on file    Attends religious service: Not on file    Active member of club or organization: Not on file    Attends meetings of clubs or organizations: Not on file    Relationship status: Not on file  Other Topics Concern  . Not on file  Social History Narrative  . Not on file    Allergies:  Allergies  Allergen Reactions  . Bee Venom Anaphylaxis  . Haloperidol Lactate Other (See Comments)    Convulsions   . Meperidine Hcl Hives  . Xifaxan [Rifaximin] Other (See Comments)    Multiple complaints     Metabolic Disorder Labs: No results found for: HGBA1C, MPG No results found for: PROLACTIN Lab Results  Component Value Date   CHOL 207 (H) 10/23/2012   TRIG 319 (H) 10/23/2012   HDL 33 (L) 10/23/2012   CHOLHDL 6.3 10/23/2012   VLDL 64 (H) 10/23/2012   LDLCALC 110 (H) 10/23/2012   Lab Results  Component Value Date   TSH 0.706 09/22/2015    Therapeutic Level Labs: No results found for: LITHIUM No results  found for: VALPROATE No components found for:  CBMZ  Current Medications: Current Outpatient Medications  Medication Sig Dispense Refill  . ALPRAZolam (XANAX) 0.5 MG tablet Take 1 tablet (0.5 mg total) by mouth daily. 90 tablet 2  . Calcium-Magnesium-Vitamin D (CALCIUM 500 PO) Take 1 tablet by mouth 2 (two) times daily.    . Certolizumab Pegol (CIMZIA PREFILLED [place]) Inject 2 Doses into the skin every 30 (thirty) days.    . diclofenac (VOLTAREN) 75 MG EC tablet Take 1 tablet by mouth daily as needed.    . diclofenac sodium (VOLTAREN) 1 % GEL Apply 1 application topically 3 (three) times daily as needed.    Marland Kitchen EPINEPHrine (EPI-PEN) 0.3 mg/0.3 mL DEVI Inject 0.3 mg into the muscle as needed. For bee venom    . fish oil-omega-3 fatty acids 1000 MG capsule Take 2 g by mouth every morning.    Marland Kitchen FOLIC ACID PO Take 1 mg by mouth 2 (two) times daily.     . hydrochlorothiazide (MICROZIDE) 12.5 MG capsule TAKE 1 CAPSULE DAILY 90 capsule 3  . methotrexate (RHEUMATREX) 2.5 MG tablet Take 10 mg by mouth once a week.    . metoprolol succinate (TOPROL-XL) 50 MG 24 hr tablet TAKE 1 TABLET TWICE A DAY  WITH OR IMMEDIATELY        FOLLOWING A MEAL. 180 tablet 3  . Multiple Vitamin (MULITIVITAMIN WITH MINERALS) TABS Take 1 tablet by mouth every morning.    Marland Kitchen NIFEdipine (PROCARDIA XL/NIFEDICAL-XL) 90 MG 24 hr tablet TAKE 1 TABLET DAILY 90 tablet 3  . nitroGLYCERIN (NITROSTAT) 0.4 MG SL tablet Place 1 tablet (0.4 mg total) under the tongue every 5 (five) minutes x 3 doses as needed for chest pain. 25 tablet 3  . pantoprazole (PROTONIX) 40 MG tablet TAKE 1  TABLET DAILY 30 tablet 6  . Probiotic Product (PHILLIPS COLON HEALTH PO) Take 1 capsule by mouth daily.    . sodium chloride (MURO 128) 5 % ophthalmic solution Place 1 drop into both eyes every 3 (three) hours as needed.      No current facility-administered medications for this visit.      Musculoskeletal: Strength & Muscle Tone: within normal limits Gait &  Station: normal Patient leans: N/A  Psychiatric Specialty Exam: Review of Systems  Musculoskeletal: Positive for back pain and joint pain.  All other systems reviewed and are negative.   There were no vitals taken for this visit.There is no height or weight on file to calculate BMI.  General Appearance: Casual, Neat and Well Groomed  Eye Contact:  Good  Speech:  Clear and Coherent  Volume:  Normal  Mood:  Euthymic  Affect:  Appropriate and Congruent  Thought Process:  Goal Directed  Orientation:  Full (Time, Place, and Person)  Thought Content: WDL   Suicidal Thoughts:  No  Homicidal Thoughts:  No  Memory:  Immediate;   Good Recent;   Good Remote;   Good  Judgement:  Good  Insight:  Good  Psychomotor Activity:  Normal  Concentration:  Concentration: Good and Attention Span: Good  Recall:  Good  Fund of Knowledge: Good  Language: Good  Akathisia:  No  Handed:  Right  AIMS (if indicated): not done  Assets:  Communication Skills Desire for Improvement Resilience Social Support Talents/Skills Vocational/Educational  ADL's:  Intact  Cognition: WNL  Sleep:  Good   Screenings:   Assessment and Plan: This patient is a 58 year old female who had gone through a serious depressive episode after her prior husband died.  She is doing very well right now and only taking Xanax 0.5 mg daily.  She will continue this dosage and return to see me in 6 months   Levonne Spiller, MD 06/25/2019, 8:40 AM

## 2019-06-30 ENCOUNTER — Other Ambulatory Visit: Payer: Self-pay | Admitting: Family Medicine

## 2019-06-30 DIAGNOSIS — N6310 Unspecified lump in the right breast, unspecified quadrant: Secondary | ICD-10-CM

## 2019-07-01 ENCOUNTER — Ambulatory Visit
Admission: RE | Admit: 2019-07-01 | Discharge: 2019-07-01 | Disposition: A | Discharge status: Home / Self Care | Payer: Commercial Managed Care - PPO | Source: Ambulatory Visit | Attending: Family Medicine | Admitting: Family Medicine

## 2019-07-01 ENCOUNTER — Other Ambulatory Visit: Payer: Self-pay | Admitting: Family Medicine

## 2019-07-01 DIAGNOSIS — N6310 Unspecified lump in the right breast, unspecified quadrant: Secondary | ICD-10-CM

## 2019-07-01 DIAGNOSIS — R599 Enlarged lymph nodes, unspecified: Secondary | ICD-10-CM

## 2019-07-02 ENCOUNTER — Ambulatory Visit
Admission: RE | Admit: 2019-07-02 | Discharge: 2019-07-02 | Disposition: A | Discharge status: Home / Self Care | Payer: Commercial Managed Care - PPO | Source: Ambulatory Visit | Attending: Family Medicine | Admitting: Family Medicine

## 2019-07-02 ENCOUNTER — Other Ambulatory Visit: Payer: Self-pay

## 2019-07-02 ENCOUNTER — Other Ambulatory Visit: Payer: Self-pay | Admitting: Family Medicine

## 2019-07-02 DIAGNOSIS — N6310 Unspecified lump in the right breast, unspecified quadrant: Secondary | ICD-10-CM

## 2019-07-02 DIAGNOSIS — R599 Enlarged lymph nodes, unspecified: Secondary | ICD-10-CM

## 2019-07-03 ENCOUNTER — Telehealth: Payer: Self-pay | Admitting: Hematology

## 2019-07-03 NOTE — Telephone Encounter (Signed)
LVM for patient about afternoon [facility] appointment for 7/22, advised packet will be sent and to return call to confirm appointment

## 2019-07-06 ENCOUNTER — Encounter: Payer: Self-pay | Admitting: *Deleted

## 2019-07-06 ENCOUNTER — Other Ambulatory Visit: Payer: Self-pay | Admitting: *Deleted

## 2019-07-06 DIAGNOSIS — C50411 Malignant neoplasm of upper-outer quadrant of right female breast: Secondary | ICD-10-CM | POA: Insufficient documentation

## 2019-07-06 DIAGNOSIS — Z170 Estrogen receptor positive status [ER+]: Secondary | ICD-10-CM

## 2019-07-08 ENCOUNTER — Encounter: Payer: Self-pay | Admitting: Genetic Counselor

## 2019-07-08 ENCOUNTER — Ambulatory Visit (HOSPITAL_BASED_OUTPATIENT_CLINIC_OR_DEPARTMENT_OTHER): Payer: Commercial Managed Care - PPO | Admitting: Genetic Counselor

## 2019-07-08 ENCOUNTER — Ambulatory Visit: Payer: Self-pay | Admitting: General Surgery

## 2019-07-08 ENCOUNTER — Inpatient Hospital Stay: Payer: Commercial Managed Care - PPO

## 2019-07-08 ENCOUNTER — Encounter: Payer: Self-pay | Admitting: Hematology

## 2019-07-08 ENCOUNTER — Inpatient Hospital Stay: Payer: Commercial Managed Care - PPO | Attending: Hematology | Admitting: Hematology

## 2019-07-08 ENCOUNTER — Ambulatory Visit
Admission: RE | Admit: 2019-07-08 | Discharge: 2019-07-08 | Disposition: A | Discharge status: Home / Self Care | Payer: Commercial Managed Care - PPO | Source: Ambulatory Visit | Attending: Radiation Oncology | Admitting: Radiation Oncology

## 2019-07-08 ENCOUNTER — Other Ambulatory Visit: Payer: Self-pay

## 2019-07-08 ENCOUNTER — Encounter: Payer: Self-pay | Admitting: Radiation Oncology

## 2019-07-08 VITALS — BP 125/73 | HR 78 | Temp 99.1°F | Resp 16 | Ht 64.0 in | Wt 171.9 lb

## 2019-07-08 DIAGNOSIS — Z808 Family history of malignant neoplasm of other organs or systems: Secondary | ICD-10-CM | POA: Diagnosis not present

## 2019-07-08 DIAGNOSIS — C50411 Malignant neoplasm of upper-outer quadrant of right female breast: Secondary | ICD-10-CM

## 2019-07-08 DIAGNOSIS — Z800 Family history of malignant neoplasm of digestive organs: Secondary | ICD-10-CM | POA: Insufficient documentation

## 2019-07-08 DIAGNOSIS — Z79899 Other long term (current) drug therapy: Secondary | ICD-10-CM | POA: Diagnosis not present

## 2019-07-08 DIAGNOSIS — Z807 Family history of other malignant neoplasms of lymphoid, hematopoietic and related tissues: Secondary | ICD-10-CM | POA: Insufficient documentation

## 2019-07-08 DIAGNOSIS — I10 Essential (primary) hypertension: Secondary | ICD-10-CM | POA: Insufficient documentation

## 2019-07-08 DIAGNOSIS — Z170 Estrogen receptor positive status [ER+]: Secondary | ICD-10-CM | POA: Insufficient documentation

## 2019-07-08 DIAGNOSIS — M069 Rheumatoid arthritis, unspecified: Secondary | ICD-10-CM | POA: Insufficient documentation

## 2019-07-08 DIAGNOSIS — E782 Mixed hyperlipidemia: Secondary | ICD-10-CM | POA: Insufficient documentation

## 2019-07-08 DIAGNOSIS — Z801 Family history of malignant neoplasm of trachea, bronchus and lung: Secondary | ICD-10-CM | POA: Diagnosis not present

## 2019-07-08 DIAGNOSIS — Z79811 Long term (current) use of aromatase inhibitors: Secondary | ICD-10-CM | POA: Insufficient documentation

## 2019-07-08 DIAGNOSIS — F17210 Nicotine dependence, cigarettes, uncomplicated: Secondary | ICD-10-CM | POA: Diagnosis not present

## 2019-07-08 LAB — CMP (CANCER CENTER ONLY)
ALT: 26 U/L (ref 0–44)
AST: 27 U/L (ref 15–41)
Albumin: 4 g/dL (ref 3.5–5.0)
Alkaline Phosphatase: 103 U/L (ref 38–126)
Anion gap: 10 (ref 5–15)
BUN: 13 mg/dL (ref 6–20)
CO2: 26 mmol/L (ref 22–32)
Calcium: 9.6 mg/dL (ref 8.9–10.3)
Chloride: 104 mmol/L (ref 98–111)
Creatinine: 0.78 mg/dL (ref 0.44–1.00)
GFR, Est AFR Am: >60 mL/min (ref >60)
GFR, Estimated: >60 mL/min (ref >60)
Glucose, Bld: 106 mg/dL — ABNORMAL HIGH (ref 70–99)
Potassium: 4 mmol/L (ref 3.5–5.1)
Sodium: 140 mmol/L (ref 135–145)
Total Bilirubin: 0.9 mg/dL (ref 0.3–1.2)
Total Protein: 7.5 g/dL (ref 6.5–8.1)

## 2019-07-08 LAB — CBC WITH DIFFERENTIAL (CANCER CENTER ONLY)
Abs Immature Granulocytes: 0.03 10*3/uL (ref 0.00–0.07)
Basophils Absolute: 0.1 10*3/uL (ref 0.0–0.1)
Basophils Relative: 1 %
Eosinophils Absolute: 0.3 10*3/uL (ref 0.0–0.5)
Eosinophils Relative: 3 %
HCT: 40.8 % (ref 36.0–46.0)
Hemoglobin: 14 g/dL (ref 12.0–15.0)
Immature Granulocytes: 0 %
Lymphocytes Relative: 40 %
Lymphs Abs: 4.2 10*3/uL — ABNORMAL HIGH (ref 0.7–4.0)
MCH: 31.3 pg (ref 26.0–34.0)
MCHC: 34.3 g/dL (ref 30.0–36.0)
MCV: 91.3 fL (ref 80.0–100.0)
Monocytes Absolute: 0.5 10*3/uL (ref 0.1–1.0)
Monocytes Relative: 5 %
Neutro Abs: 5.3 10*3/uL (ref 1.7–7.7)
Neutrophils Relative %: 51 %
Platelet Count: 378 10*3/uL (ref 150–400)
RBC: 4.47 MIL/uL (ref 3.87–5.11)
RDW: 13.3 % (ref 11.5–15.5)
Smear Review: 1
WBC Count: 10.4 10*3/uL (ref 4.0–10.5)
nRBC: 0 % (ref 0.0–0.2)

## 2019-07-08 MED ORDER — ANASTROZOLE 1 MG PO TABS: 1.0000 mg | ORAL_TABLET | Freq: Every day | ORAL | 2 refills | Status: DC

## 2019-07-08 NOTE — Progress Notes (Signed)
[place]   [phone] [phone] [place] Note   Patient Care Team: Janie Morning, DO as PCP - General (Family Medicine) Satira Sark, MD as PCP - Cardiology (Cardiology) Lendon Colonel, NP as Nurse Practitioner (Nurse Practitioner) Mauro Kaufmann, RN as Oncology Nurse Navigator Rockwell Germany, RN as Oncology Nurse Navigator Truitt Merle, MD as Consulting Physician (Hematology) Jovita Kussmaul, MD as Consulting Physician (General Surgery) Gery Pray, MD as Consulting Physician (Radiation Oncology)  Date of Service:  07/08/2019   CHIEF COMPLAINTS/PURPOSE OF CONSULTATION:  Malignant neoplasm of upper-outer quadrant of right breast    Oncology History  Breast cancer ([place])  10/16/2002 Initial Biopsy   10/16/2002   BREAST, RIGHT, 4 O'CLOCK, NEEDLE BIOPSIES: INVASIVE AND   INTRADUCTAL CARCINOMA.  Results   IMMUNOHISTOCHEMICAL AND MORPHOMETRIC ANALYSIS BY IMAGE CYTOMETRY   (The tumor is immunocytochemically stained for estrogen and   progesterone receptors and Mib-1, and quantitated   morphometrically.)   Estrogen Receptor (Negative, <1%): NEGATIVE   Progesterone Receptor (Negative, <1%): NEGATIVE   Proliferation Marker Ki67 by MIB-1 (Low <30%): 47%  HERCEPTEST   Her 2 neu by immunocytochemical stain (Negative): 3+;   POSITIVE    10/30/2002 Surgery   Surgery 10/30/02 FINAL DIAGNOSIS    1. SENTINEL LYMPH NODE BIOPSY, RIGHT AXILLARY NODE: ONE LYMPH   NODE, NEGATIVE FOR METASTATIC CARCINOMA (0/1), SEE COMMENT.    2. LUMPECTOMY, RIGHT BREAST:   - INVASIVE MAMMARY CARCINOMA, NO SPECIAL TYPE (DUCTAL), 1.3   CM, MSBR GRADE 3 OF 3.   - DUCTAL CARCINOMA IN SITU, HIGH-GRADE.   - EXTENSIVE INTRADUCTAL COMPONENT NEGATIVE (JCRT).   - NO LYMPHOVASCULAR INVASION IDENTIFIED.   - MARGINS FREE OF TUMOR.   - SEE COMMENT AND ONCOLOGY TABLE.    2003 - 07/2003 Radiation Therapy   Underwent adjuvant radiation to 50.4 gray with  an additional 10 gray boost to the surgical cavity completed 07/19/2003     - 02/2004 Chemotherapy   AC for 4 cycles and decetaxel and Herceptin for 4 cycles and then continued herceptin for totla 1 year treatment.      02/22/2012 Initial Diagnosis   Breast cancer ([place])   Malignant neoplasm of upper-outer quadrant of right breast in female, estrogen receptor positive ([place])  07/01/2019 Mammogram   Diagnostic mammogram 07/01/19  IMPRESSION: Suspicious mass in the 9:30 region of the right breast 9 cm from the nipple measuring 1.3 x 1.3 x 1.1 cm. Suspicious mass in the right axilla measuring 5 x 4 x 4 mm.   07/02/2019 Cancer Staging   Staging form: Breast, AJCC 8th Edition - Clinical stage from 07/02/2019: Stage IA (cT1c, cN0, cM0, G2, ER+, PR+, HER2: Equivocal) - Signed by Truitt Merle, MD on 07/07/2019   07/02/2019 Initial Biopsy   Diagnosis 07/02/19 1. Breast, right, needle core biopsy, 9:30 o'clock - INVASIVE DUCTAL CARCINOMA, GRADE 1-2. SEE NOTE - DUCTAL CARCINOMA IN SITU, INTERMEDIATE GRADE 2. Lymph node, needle/core biopsy, right axilla - LYMPH NODE, NEGATIVE FOR CARCINOMA   07/02/2019 Receptors her2   By immunohistochemistry, the tumor cells are EQUIVOCAL for Her2 (2+). HER2 by [facility] will be PERFORMED and the RESULTS REPORTED SEPARATELY Estrogen Receptor: 100%, POSITIVE, STRONG STAINING INTENSITY Progesterone Receptor: 90%, POSITIVE, STRONG STAINING INTENSITY Proliferation Marker Ki67: 10%   07/06/2019 Initial Diagnosis   Malignant neoplasm of upper-outer quadrant of right breast in female, estrogen receptor positive ([place])   07/08/2019 -  Anti-estrogen oral therapy   Anastrozole 27m  once daily starting 07/08/19 before surgery.       HISTORY OF PRESENTING ILLNESS:  Rachel Sutton 58 y.o. female is a here because of newly diagnosed right breast cancer. The patient presents to the Breast clinic today alone.  She had right breast cancer in 2003. Her surgeon was with Dr. Annamaria Boots and  Medication Onc was Dr. Truddie Coco. She tolerated chemo well overall. She had no major complications and recovered well from treatment.  She has been doing yearly mammogram and self exams. She noticed her lump 8 days ago and then went for mammogram which showed right breast mass. Biopsy confirmed invasive cancer.  Today the patient notes having RA diagnosed in 2018. She is seen by Rheumatologist. She is on lowe dose weekly methotrexate and Cimzia self-injections q2weeks. She stopped injections after breast cancer diagnosis.    Socially she is married with 2 adult children. She has a PMHx of RA, HTN, Anxiety. She is on Xanax but is getting weaned off currently. She does not feel worse with cancer diagnosis. She had colitis, mitral valve prolapse, C.diff in the past. She had hysterectomy for endometriosis and several orthopedic surgeries. She went through menopause during her last breast cancer and had testing to see if she was post menopausal 3 years ago.  Father with Esophogeal cancer at 65yo, Mother with pancreatic caner at 50 yo, Brother with brain cancer at 70yo, MGF with brain cancer at 58 yo.    GYN HISTORY  Menarchal: 12 LMP: 10/1998 Contraceptive: yes from [phone] HRT: No G2P2: First at age 2    REVIEW OF SYSTEMS:    Constitutional: Denies fevers, chills or abnormal night sweats Eyes: Denies blurriness of vision, double vision or watery eyes Ears, nose, mouth, throat, and face: Denies mucositis or sore throat Respiratory: Denies cough, dyspnea or wheezes Cardiovascular: Denies palpitation, chest discomfort or lower extremity swelling Gastrointestinal:  Denies nausea, heartburn or change in bowel habits Skin: Denies abnormal skin rashes MSK: (+) RA  Lymphatics: Denies new lymphadenopathy or easy bruising Neurological:Denies numbness, tingling or new weaknesses Behavioral/Psych: Mood is stable, no new changes  Breast: (+) Palpable right breast mass  All other systems were reviewed  with the patient and are negative.   MEDICAL HISTORY:  Past Medical History:  Diagnosis Date   Anxiety    Arthritis    Breast cancer ([place]) 02/22/2012   Right T1c, N0   C. difficile colitis    Colitis, collagenous 09/22/2015   Depression    Essential hypertension, benign    Family history of brain cancer    Family history of esophageal cancer    Family history of lung cancer    Family history of lymphoma    Family history of pancreatic cancer    Fuchs' corneal dystrophy    GERD (gastroesophageal reflux disease)    Hypertension    Mitral valve prolapse    Mild mitral regurgitation   Mixed hyperlipidemia    Seizures ([place])    Only with dose of haldol; no more since that was stopped.    SURGICAL HISTORY: Past Surgical History:  Procedure Laterality Date   ABDOMINAL HYSTERECTOMY  1999   BIOPSY N/A 09/28/2015   Procedure: BIOPSY;  Surgeon: Danie Binder, MD;  Location: [facility];  Service: Endoscopy;  Laterality: N/A;   BIOPSY  04/05/2016   Procedure: BIOPSY;  Surgeon: Danie Binder, MD;  Location: [facility];  Service: Endoscopy;;  colon bx   BREAST LUMPECTOMY  10/30/2002   right  Carpal tunnel R/L  1992   Cervical spine fusion X2 since last visit     COLONOSCOPY WITH PROPOFOL N/A 09/28/2015   Procedure: COLONOSCOPY WITH PROPOFOL;  Surgeon: Danie Binder, MD;  Location: [facility];  Service: Endoscopy;  Laterality: N/A;  procedure 1 cecum time in 1232  time out 1246   total time 14 mintes   COLONOSCOPY WITH PROPOFOL N/A 04/05/2016   Procedure: COLONOSCOPY WITH PROPOFOL;  Surgeon: Danie Binder, MD;  Location: [facility];  Service: Endoscopy;  Laterality: N/A;  1200   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   ELBOW SURGERY Right  tennis elbow release 2012   ESOPHAGOGASTRODUODENOSCOPY  10/26/2012   Procedure: ESOPHAGOGASTRODUODENOSCOPY (EGD);  Surgeon: Inda Castle, MD;  Location: [facility];  Service: Endoscopy;  Laterality: N/A;    ESOPHAGOGASTRODUODENOSCOPY (EGD) WITH PROPOFOL N/A 09/28/2015   Procedure: ESOPHAGOGASTRODUODENOSCOPY (EGD) WITH PROPOFOL;  Surgeon: Danie Binder, MD;  Location: [facility];  Service: Endoscopy;  Laterality: N/A;   KNEE ARTHROSCOPY Left    LEFT HEART CATHETERIZATION WITH CORONARY ANGIOGRAM N/A 10/23/2012   Procedure: LEFT HEART CATHETERIZATION WITH CORONARY ANGIOGRAM;  Surgeon: Peter M Martinique, MD;  Location: [facility];  Service: Cardiovascular;  Laterality: N/A;   OVARIAN CYST REMOVAL     prior to hysterectomy, right   Ray cage fusion  [place]  10/30/2002   right    SOCIAL HISTORY: Social History   Socioeconomic History   Marital status: Married    Spouse name: Not on file   Number of children: 2   Years of education: Not on file   Highest education level: Not on file  Occupational History   Occupation: Aeronautical engineer: [employer]    Comment: [employer]  Social Designer, fashion/clothing strain: Not on file   Food insecurity    Worry: Not on file    Inability: Not on file   Transportation needs    Medical: Not on file    Non-medical: Not on file  Tobacco Use   Smoking status: Current Every Day Smoker    Packs/day: 0.50    Years: 35.00    Pack years: 17.50    Types: Cigarettes    Start date: 07/07/1974   Smokeless tobacco: Never Used  Substance and Sexual Activity   Alcohol use: Yes    Alcohol/week: 0.0 standard drinks    Comment: Rare( stopped July 26, 2014)   Drug use: No   Sexual activity: Not Currently    Birth control/protection: Surgical  Lifestyle   Physical activity    Days per week: Not on file    Minutes per session: Not on file   Stress: Not on file  Relationships   Social connections    Talks on phone: Not on file    Gets together: Not on file    Attends religious service: Not on file    Active member of club or organization: Not on file    Attends meetings of clubs or organizations: Not on file     Relationship status: Not on file   Intimate partner violence    Fear of current or ex partner: Not on file    Emotionally abused: Not on file    Physically abused: Not on file    Forced sexual activity: Not on file  Other Topics Concern   Not on file  Social History Narrative   Not on file  FAMILY HISTORY: Family History  Problem Relation Age of Onset   Diabetes Mother    Pancreatic cancer Mother 7   Lung cancer Father    Diabetes Father    Heart disease Father    Esophageal cancer Father 75       Beer drinker   Brain cancer Brother 22   Brain cancer Maternal Grandfather    Heart disease Paternal Grandfather    Cancer Maternal Uncle        unknown form of cancer; cigar smoker   Lung cancer Maternal Grandmother    Brain cancer Cousin        dx in his late 19s-40s   Schizophrenia Sister    Lymphoma Niece 60   Anesthesia problems Neg Hx    Hypotension Neg Hx    Malignant hyperthermia Neg Hx    Pseudochol deficiency Neg Hx    Colon cancer Neg Hx    Colon polyps Neg Hx    Crohn's disease Neg Hx    Ulcerative colitis Neg Hx     ALLERGIES:  is allergic to bee venom; haloperidol lactate; meperidine hcl; and xifaxan [rifaximin].  MEDICATIONS:  Current Outpatient Medications  Medication Sig Dispense Refill   ALPRAZolam (XANAX) 0.5 MG tablet Take 1 tablet (0.5 mg total) by mouth daily. 90 tablet 2   Calcium-Magnesium-Vitamin D (CALCIUM 500 PO) Take 1 tablet by mouth 2 (two) times daily.     diclofenac sodium (VOLTAREN) 1 % GEL Apply 1 application topically 3 (three) times daily as needed.     EPINEPHrine (EPI-PEN) 0.3 mg/0.3 mL DEVI Inject 0.3 mg into the muscle as needed. For bee venom     fish oil-omega-3 fatty acids 1000 MG capsule Take 2 g by mouth every morning.     FOLIC ACID PO Take 1 mg by mouth 2 (two) times daily.      hydrochlorothiazide (MICROZIDE) 12.5 MG capsule TAKE 1 CAPSULE DAILY 90 capsule 3   methotrexate (RHEUMATREX)  2.5 MG tablet Take 10 mg by mouth once a week.     metoprolol succinate (TOPROL-XL) 50 MG 24 hr tablet TAKE 1 TABLET TWICE A DAY  WITH OR IMMEDIATELY        FOLLOWING A MEAL. 180 tablet 3   Multiple Vitamin (MULITIVITAMIN WITH MINERALS) TABS Take 1 tablet by mouth every morning.     NIFEdipine (PROCARDIA XL/NIFEDICAL-XL) 90 MG 24 hr tablet TAKE 1 TABLET DAILY 90 tablet 3   nitroGLYCERIN (NITROSTAT) 0.4 MG SL tablet Place 1 tablet (0.4 mg total) under the tongue every 5 (five) minutes x 3 doses as needed for chest pain. 25 tablet 3   pantoprazole (PROTONIX) 40 MG tablet TAKE 1 TABLET DAILY 30 tablet 6   Probiotic Product (PHILLIPS COLON HEALTH PO) Take 1 capsule by mouth daily.     sodium chloride (MURO 128) 5 % ophthalmic solution Place 1 drop into both eyes every 3 (three) hours as needed.      anastrozole (ARIMIDEX) 1 MG tablet Take 1 tablet (1 mg total) by mouth daily. 30 tablet 2   No current facility-administered medications for this visit.     PHYSICAL EXAMINATION: ECOG PERFORMANCE STATUS: 0 - Asymptomatic  Vitals:   07/08/19 1253  BP: 125/73  Pulse: 78  Resp: 16  Temp: 99.1 F (37.3 C)  SpO2: 98%   Filed Weights   07/08/19 1253  Weight: 171 lb 14.4 oz (78 kg)    GENERAL:alert, no distress and comfortable SKIN: skin color, texture, turgor are  normal, no rashes or significant lesions EYES: normal, Conjunctiva are pink and non-injected, sclera clear  NECK: supple, thyroid normal size, non-tender, without nodularity LYMPH:  no palpable lymphadenopathy in the cervical, axillary  LUNGS: clear to auscultation and percussion with normal breathing effort HEART: regular rate & rhythm and no murmurs and no lower extremity edema ABDOMEN:abdomen soft, non-tender and normal bowel sounds Musculoskeletal:no cyanosis of digits and no clubbing  NEURO: alert & oriented x 3 with fluent speech, no focal motor/sensory deficits BREAST: (+) H/o right breast lumpectomy: surgical  incision healed well (+) Diffuse yellowing of skin at biopsy site with tenderness (+) subtle 1cm mass in 8-9:00 position of right breast. No adenopathy bilaterally. Left breast exam benign.    LABORATORY DATA:  I have reviewed the data as listed CBC Latest Ref Rng & Units 07/08/2019 04/03/2016 09/26/2015  WBC 4.0 - 10.5 K/uL 10.4 9.3 8.2  Hemoglobin 12.0 - 15.0 g/dL 14.0 12.6 13.9  Hematocrit 36.0 - 46.0 % 40.8 36.9 39.4  Platelets 150 - 400 K/uL 378 389 353    CMP Latest Ref Rng & Units 07/08/2019 04/03/2016 01/23/2016  Glucose 70 - 99 mg/dL 106(H) 118(H) 98  BUN 6 - 20 mg/dL _0 Creatinine 0.44 - 1.00 mg/dL 0.78 0.70 0.64  Sodium 135 - 145 mmol/L 140 140 137  Potassium 3.5 - 5.1 mmol/L 4.0 4.4 4.1  Chloride 98 - 111 mmol/L 104 108 102  CO2 22 - 32 mmol/L _1 Calcium 8.9 - 10.3 mg/dL 9.6 8.4(L) 9.4  Total Protein 6.5 - 8.1 g/dL 7.5 - -  Total Bilirubin 0.3 - 1.2 mg/dL 0.9 - -  Alkaline Phos 38 - 126 U/L 103 - -  AST 15 - 41 U/L 27 - -  ALT 0 - 44 U/L 26 - -     RADIOGRAPHIC STUDIES: I have personally reviewed the radiological images as listed and agreed with the findings in the report. US Breast Ltd Uni Right Inc Axilla  Result Date: 07/06/2019 CLINICAL DATA:  58 year old female complaining of a palpable abnormality in the right breast. History of right breast cancer status post lumpectomy in 2003. EXAM: DIGITAL DIAGNOSTIC BILATERAL MAMMOGRAM WITH CAD AND TOMO ULTRASOUND RIGHT BREAST COMPARISON:  Previous exam(s). ACR Breast Density Category c: The breast tissue is heterogeneously dense, which may obscure small masses. FINDINGS: No suspicious mass or malignant type microcalcifications identified in the left breast. In the posterior aspect of the upper-outer quadrant of the right breast is a spiculated mass measuring 1.8 cm associated with punctate calcifications. Surgical clips are seen in the right axilla and there is a developing 6 mm mass adjacent to the clips. Mammographic  images were processed with CAD. On physical exam, I palpate a discrete area of thickening in the right breast at 9:30 9 cm from the nipple. Targeted ultrasound is performed, showing an irregular hypoechoic mass in the right breast at 9:30 9 cm from the nipple measuring 1.3 x 1.3 x 1.1 cm. Sonographic evaluation of the right axilla shows a hypoechoic mass measuring 5 x 4 x 4 mm. IMPRESSION: Suspicious mass in the 9:30 region of the right breast and suspicious mass in the right axilla. RECOMMENDATION: Ultrasound-guided core biopsies of the mass in the right breast and the mass in the right axilla is recommended. I have discussed the findings and recommendations with the patient. Results were also provided in writing at the conclusion of the visit. If applicable, a reminder letter will be sent to  the patient regarding the next appointment. BI-RADS CATEGORY  5: Highly suggestive of malignancy. Electronically Signed   By: Lillia Mountain M.D.   On: 07/01/2019 12:00   Mm Diag Breast Tomo Bilateral  Result Date: 07/01/2019 CLINICAL DATA:  58 year old female complaining of a palpable abnormality in the right breast. History of right breast cancer status post lumpectomy in 2003. EXAM: DIGITAL DIAGNOSTIC BILATERAL MAMMOGRAM WITH CAD AND TOMO ULTRASOUND RIGHT BREAST COMPARISON:  Previous exam(s). ACR Breast Density Category c: The breast tissue is heterogeneously dense, which may obscure small masses. FINDINGS: No suspicious mass or malignant type microcalcifications identified in the left breast. In the posterior aspect of the upper-outer quadrant of the right breast is a spiculated mass measuring 1.8 cm associated with punctate calcifications. Surgical clips are seen in the right axilla and there is a developing 6 mm mass adjacent to the clips. Mammographic images were processed with CAD. On physical exam, I palpate a discrete area of thickening in the right breast at 9:30 9 cm from the nipple. Targeted ultrasound is  performed, showing an irregular hypoechoic mass in the right breast at 9:30 9 cm from the nipple measuring 1.3 x 1.3 x 1.1 cm. Sonographic evaluation of the right axilla shows a hypoechoic mass measuring 5 x 4 x 4 mm. IMPRESSION: Suspicious mass in the 9:30 region of the right breast and suspicious mass in the right axilla. RECOMMENDATION: Ultrasound-guided core biopsies of the mass in the right breast and the mass in the right axilla is recommended. I have discussed the findings and recommendations with the patient. Results were also provided in writing at the conclusion of the visit. If applicable, a reminder letter will be sent to the patient regarding the next appointment. BI-RADS CATEGORY  5: Highly suggestive of malignancy. Electronically Signed   By: Lillia Mountain M.D.   On: 07/01/2019 12:00   [place] Axillary Node Core Biopsy Right  Addendum Date: 07/06/2019   ADDENDUM REPORT: 07/06/2019 11:47 ADDENDUM: Pathology revealed GRADE I-II INVASIVE DUCTAL CARCINOMA, INTERMEDIATE GRADE DUCTAL CARCINOMA IN SITU of the 9:30 o'clock position. This was found to be concordant by Dr. Claudie Revering. Pathology revealed LYMPH NODE, NEGATIVE FOR CARCINOMA of the RIGHT axilla. This was found to be concordant by Dr. Claudie Revering. Pathology results were discussed with the patient by telephone. The patient reported doing well after the biopsy with tenderness at the site. Post biopsy instructions and care were reviewed and questions were answered. The patient was encouraged to call [facility] for any additional concerns. The patient was referred to [facility] at [facility] on July 08, 2019. Pathology results reported by Stacie Acres, RN on 07/06/2019. Electronically Signed   By: Claudie Revering M.D.   On: 07/06/2019 11:47   Result Date: 07/06/2019 CLINICAL DATA:  1.3 cm mass highly suspicious for malignancy in the 9:30 o'clock position of the right  breast and 5 mm right axillary mass suspicious for malignancy at recent mammography and ultrasound. Previous right lumpectomy for breast cancer in 2003 with radiation therapy and chemotherapy. EXAM: ULTRASOUND GUIDED RIGHT BREAST CORE NEEDLE BIOPSY ULTRASOUND-GUIDED RIGHT AXILLARY CORE NEEDLE BIOPSY COMPARISON:  Previous exam(s). FINDINGS: I met with the patient and we discussed the procedure of ultrasound-guided biopsy, including benefits and alternatives. We discussed the high likelihood of successful procedures. We discussed the risks of the procedure, including infection, bleeding, tissue injury, clip migration, and inadequate sampling. Informed written consent was given. The  usual time-out protocol was performed immediately prior to the procedure. LESION #1: 1.3 CM MASS 9:30 O'CLOCK RIGHT BREAST Lesion quadrant: Upper outer quadrant Using sterile technique and 1% Lidocaine as local anesthetic, under direct ultrasound visualization, a 12 gauge spring-loaded device was used to perform biopsy of the recently demonstrated 1.3 cm mass in the 9:30 o'clock position of the right breast using a caudal approach. At the conclusion of the procedure a ribbon shaped tissue marker clip was deployed into the biopsy cavity. Follow up 2 view mammogram was performed and dictated separately. LESION #2: 5 MM RIGHT AXILLARY MASS Using sterile technique and 1% Lidocaine as local anesthetic, under direct ultrasound visualization, a 12 gauge spring-loaded device was used to perform biopsy of the recently demonstrated 5 mm mass in the right axilla using a caudal approach. At the conclusion of the procedure a spiral shaped HydroMARK tissue marker clip was deployed into the biopsy cavity. Follow up 2 view mammogram was performed and dictated separately. IMPRESSION: Ultrasound guided biopsy of a 1.3 cm mass in the 9:30 o'clock position of the right breast and a 5 mm mass in the right axilla. No apparent complications. Electronically  Signed: By: Claudie Revering M.D. On: 07/02/2019 09:44   Mm Clip Placement Right  Result Date: 07/02/2019 CLINICAL DATA:  Status post ultrasound-guided core needle biopsies of a 1.3 cm mass in the 9:30 o'clock position of the right breast and a 5 mm mass in the right axilla. EXAM: DIAGNOSTIC RIGHT MAMMOGRAM POST ULTRASOUND BIOPSY X 2 COMPARISON:  Previous exam(s). FINDINGS: Mammographic images were obtained following ultrasound guided biopsy of the recently demonstrated 1.3 cm mass in the 9:30 o'clock position of the right breast and the 5 mm mass in the right axilla. These demonstrate a ribbon shaped biopsy marker clip in the posterior aspect of the biopsied mass in the 9:30 o'clock position of the breast. There is also a spiral shaped biopsy marker clip at the expected location of the mass biopsied in the right axilla. IMPRESSION: Appropriate deployment of the ribbon shaped and spiral shaped HydroMARK biopsy marker clips following right breast and right axillary ultrasound-guided core needle biopsies. Final Assessment: Post Procedure Mammograms for Marker Placement Electronically Signed   By: Claudie Revering M.D.   On: 07/02/2019 09:53   [place] Rt Breast Bx W Loc Dev 1st Lesion Img Bx Spec US Guide  Addendum Date: 07/06/2019   ADDENDUM REPORT: 07/06/2019 11:47 ADDENDUM: Pathology revealed GRADE I-II INVASIVE DUCTAL CARCINOMA, INTERMEDIATE GRADE DUCTAL CARCINOMA IN SITU of the 9:30 o'clock position. This was found to be concordant by Dr. Claudie Revering. Pathology revealed LYMPH NODE, NEGATIVE FOR CARCINOMA of the RIGHT axilla. This was found to be concordant by Dr. Claudie Revering. Pathology results were discussed with the patient by telephone. The patient reported doing well after the biopsy with tenderness at the site. Post biopsy instructions and care were reviewed and questions were answered. The patient was encouraged to call [facility] for any additional concerns. The patient was referred  to [facility] at [facility] on July 08, 2019. Pathology results reported by Stacie Acres, RN on 07/06/2019. Electronically Signed   By: Claudie Revering M.D.   On: 07/06/2019 11:47   Result Date: 07/06/2019 CLINICAL DATA:  1.3 cm mass highly suspicious for malignancy in the 9:30 o'clock position of the right breast and 5 mm right axillary mass suspicious for malignancy at recent mammography and ultrasound. Previous right  lumpectomy for breast cancer in 2003 with radiation therapy and chemotherapy. EXAM: ULTRASOUND GUIDED RIGHT BREAST CORE NEEDLE BIOPSY ULTRASOUND-GUIDED RIGHT AXILLARY CORE NEEDLE BIOPSY COMPARISON:  Previous exam(s). FINDINGS: I met with the patient and we discussed the procedure of ultrasound-guided biopsy, including benefits and alternatives. We discussed the high likelihood of successful procedures. We discussed the risks of the procedure, including infection, bleeding, tissue injury, clip migration, and inadequate sampling. Informed written consent was given. The usual time-out protocol was performed immediately prior to the procedure. LESION #1: 1.3 CM MASS 9:30 O'CLOCK RIGHT BREAST Lesion quadrant: Upper outer quadrant Using sterile technique and 1% Lidocaine as local anesthetic, under direct ultrasound visualization, a 12 gauge spring-loaded device was used to perform biopsy of the recently demonstrated 1.3 cm mass in the 9:30 o'clock position of the right breast using a caudal approach. At the conclusion of the procedure a ribbon shaped tissue marker clip was deployed into the biopsy cavity. Follow up 2 view mammogram was performed and dictated separately. LESION #2: 5 MM RIGHT AXILLARY MASS Using sterile technique and 1% Lidocaine as local anesthetic, under direct ultrasound visualization, a 12 gauge spring-loaded device was used to perform biopsy of the recently demonstrated 5 mm mass in the right axilla using a caudal approach. At  the conclusion of the procedure a spiral shaped HydroMARK tissue marker clip was deployed into the biopsy cavity. Follow up 2 view mammogram was performed and dictated separately. IMPRESSION: Ultrasound guided biopsy of a 1.3 cm mass in the 9:30 o'clock position of the right breast and a 5 mm mass in the right axilla. No apparent complications. Electronically Signed: By: Claudie Revering M.D. On: 07/02/2019 09:44    ASSESSMENT & PLAN:  ADORIA KAWAMOTO is a 58 y.o. Caucasian female with a history of RA, HTN, Anxiety, H/o colitis, mitral valve prolapse, C.diff.   1 Malignant neoplasm of upper-outer quadrant of right breast, Stage IA, c(T1cN0M0), ER+/PP+, HER2-, Grade 1-2  -We discussed her image findings and the biopsy results in great details. 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 Her right LN biopsy was negative for carcinoma.  -Given prior right lumpectomy and RT for breast cancer, she will proceed with right mastectomy and SLNB biopsy followed by reconstruction. She is agreeable with that. She was seen by Dr. Marlou Starks and has been scheduled to see Dr. Marla Roe.  -I recommend a Oncotype Dx test on the surgical sample and we'll make a decision about adjuvant chemotherapy based on the Oncotype result. She is young and fit, would be a good candidate for chemotherapy if her Oncotype recurrence score is high. She previously had Adriamycin for breast cancer, so she would likely get docetaxel and Cytoxan if she has high risk disease. -If her surgical sentinel lymph node positive, I recommend mammaprint for further risk stratification and guide adjuvant chemotherapy. -The risk of recurrence depends on the stage and biology of the tumor. She is early stage, with ER/PR positive and HER2 negative markers and low Ki67. I discussed this is the more common type of slow growing tumor.  -She was also seen by radiation oncologist Dr. Sondra Come today. Given she had RT of right breast before, she will  proceed mastectomy and she will not need postmastectomy radiation if node negative. -Given the strong ER and PR expression in her postmenopausal status, I recommend adjuvant endocrine therapy with aromatase inhibitor such as Anastrozole or Tamoxifen for a total of 5-10 years to reduce the risk of cancer recurrence. She was previously tested  by her gynecologist 3 years ago and she is postmenopausal now. --The potential benefit and side effects of AI, which includes but not limited to, hot flash, skin and vaginal dryness, metabolic changes ( increased blood glucose, cholesterol, weight, etc.), slightly in increased risk of cardiovascular disease, cataracts, muscular and joint discomfort, osteopenia and osteoporosis, etc, were discussed with her in great details. She is interested.  -Given her surgery will take time to set up, she can start anastrozole in interim. She is agreeable, I called in today (07/08/19) -We also discussed the breast cancer surveillance after her surgery. She will continue annual screening mammogram, self exam, and a routine office visit with lab and exam with [place]. -I encouraged her to have healthy diet and exercise regularly -Labs reviewed, CBC and CMP WNL except BG 106. Physical exam shows 1cm mass of right breast, otherwise unremarkable.  -F/u after surgery os sooner if she develops concerning symptoms from anastrozole.    2. H/o Right breast ductal carcinoma, pT1cN0M0 stage IA, ER-/PR-, HER2 +, Grade 3 -Diagnosed on 10/16/02 managed by Dr. Eston Esters  -Treated with right lumpectomy and SNLB, Adjuvant chemo AC-Docetaxol/Herceptin and RT   3. Genetics -Given prior breast cancer and family history of cancer she is eligible for genetics. She is interested.  -Will proceed with genetics today    4. Rheumatoid arthritis, diagnosed in 2018.  -She is seen by Rheumatologist.  -She is currently on low dose weekly methotrexate and Cimzia self-injections q2weeks. She stopped  injections after breast cancer diagnosis.    5. H/o Mitral Valve, HTN, GERD -Had cardiac cath in 2013, no stent placed.  -On Metoprolol, nitroglycerin, Protonix    6. Smoking Cessation, Anxiety   -She used to smokes 2 ppd now smokes 1/2 ppd. She has a 30 year history  -I discussed smoking cessation as this can lead to cancer and overall negatively impact her health and the healing of her upcoming surgery.  -She is on Xanax for anxiety but is currently being weaned off. Mood has been stable with cancer diagnosis, per patient. She was on Paxil before. May restart SSRI in the future to help with anti-estrogen side effects.     PLAN:  -I called in anastrozole today to start while she is waiting for right mastectomy  -Oncotype on her surgical sample or Mammaprint if node positive  -F/u with her 1 month after surgery     No orders of the defined types were placed in this encounter.   All questions were answered. The patient knows to call the clinic with any problems, questions or concerns. I spent 45 minutes counseling the patient face to face. The total time spent in the appointment was 60 minutes and more than 50% was on counseling.     Truitt Merle, MD 07/08/2019 4:50 PM  I, Joslyn Devon, am acting as scribe for Truitt Merle, MD.   I have reviewed the above documentation for accuracy and completeness, and I agree with the above.

## 2019-07-08 NOTE — Progress Notes (Signed)
Radiation Oncology         [phone] ________________________________  Multidisciplinary Breast Oncology Clinic [facility]) Initial Outpatient Consultation  Name: Rachel Sutton MRN: [id number]  Date: 07/08/2019  DOB: 1961/02/04  TD:DUKGURK, Hinton Dyer, DO  Jovita Kussmaul, MD   REFERRING PHYSICIAN: Autumn Messing III, MD  DIAGNOSIS: The encounter diagnosis was Malignant neoplasm of upper-outer quadrant of right breast in female, estrogen receptor positive ([place]).  Stage IA (cT1c, cN0, cM0) Right Breast UOQ, Invasive Ductal Carcinoma with DCIS, ER+ / PR+ / Her2-, Grade 2    ICD-10-CM   1. Malignant neoplasm of upper-outer quadrant of right breast in female, estrogen receptor positive ([place])  C50.411    Z17.0     HISTORY OF PRESENT ILLNESS::Rachel Sutton is a 58 y.o. female who is presenting to the office today for evaluation of her newly diagnosed breast cancer. She has a history of right breast cancer, s/p lumpectomy in 2003.  Nishka presented with a palpable abnormality in her right breast. She underwent bilateral diagnostic mammography with tomography and right breast ultrasonography at [facility] on 07/01/2019 showing: suspicious mass in the 9:30 region of the right breast; suspicious mass in the right axilla.  Biopsy on 07/02/2019 showed: invasive ductal carcinoma, grade 1-2; DCIS, intermediate grade. Prognostic indicators significant for: estrogen receptor, 100% positive and progesterone receptor, 90% positive, both with strong staining intensity. Proliferation marker Ki67 at 10%. HER2 equivocal by immunohistochemistry, negative by FISH.  She will undergo genetic testing today.  Menarche: 58 years old Age at first live birth: 58 years old GP: 2 LMP: 10/1998 (s/p hysterectomy) Contraceptive: [place] HRT: no   The patient was referred today for presentation in the multidisciplinary conference.  Radiology studies and pathology slides were presented there for review and  discussion of treatment options.  A consensus was discussed regarding potential next steps.  PREVIOUS RADIATION THERAPY: Yes  2003: Right Breast was treated to 50.4 Gy in 28 fractions of 1.8 Gy, and the surgical cavity was boosted to 60.4 Gy with 5 additional fractions of 2 Gy (Dr. Tammi Klippel)  PAST MEDICAL HISTORY:  Past Medical History:  Diagnosis Date   Anxiety    Arthritis    Breast cancer ([place]) 02/22/2012   Right T1c, N0   C. difficile colitis    Colitis, collagenous 09/22/2015   Depression    Essential hypertension, benign    Family history of brain cancer    Family history of esophageal cancer    Family history of lung cancer    Family history of lymphoma    Family history of pancreatic cancer    Fuchs' corneal dystrophy    GERD (gastroesophageal reflux disease)    Hypertension    Mitral valve prolapse    Mild mitral regurgitation   Mixed hyperlipidemia    Seizures ([place])    Only with dose of haldol; no more since that was stopped.    PAST SURGICAL HISTORY: Past Surgical History:  Procedure Laterality Date   ABDOMINAL HYSTERECTOMY  1999   BIOPSY N/A 09/28/2015   Procedure: BIOPSY;  Surgeon: Danie Binder, MD;  Location: [facility];  Service: Endoscopy;  Laterality: N/A;   BIOPSY  04/05/2016   Procedure: BIOPSY;  Surgeon: Danie Binder, MD;  Location: [facility];  Service: Endoscopy;;  colon bx   BREAST LUMPECTOMY  10/30/2002   right   Carpal tunnel R/L  1992   Cervical spine fusion X2 since last visit     COLONOSCOPY  WITH PROPOFOL N/A 09/28/2015   Procedure: COLONOSCOPY WITH PROPOFOL;  Surgeon: Danie Binder, MD;  Location: [facility];  Service: Endoscopy;  Laterality: N/A;  procedure 1 cecum time in 1232  time out 1246   total time 14 mintes   COLONOSCOPY WITH PROPOFOL N/A 04/05/2016   Procedure: COLONOSCOPY WITH PROPOFOL;  Surgeon: Danie Binder, MD;  Location: [facility];  Service: Endoscopy;  Laterality: N/A;  1200   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   ELBOW SURGERY Right  tennis elbow release 2012   ESOPHAGOGASTRODUODENOSCOPY  10/26/2012   Procedure: ESOPHAGOGASTRODUODENOSCOPY (EGD);  Surgeon: Inda Castle, MD;  Location: [facility];  Service: Endoscopy;  Laterality: N/A;   ESOPHAGOGASTRODUODENOSCOPY (EGD) WITH PROPOFOL N/A 09/28/2015   Procedure: ESOPHAGOGASTRODUODENOSCOPY (EGD) WITH PROPOFOL;  Surgeon: Danie Binder, MD;  Location: [facility];  Service: Endoscopy;  Laterality: N/A;   KNEE ARTHROSCOPY Left    LEFT HEART CATHETERIZATION WITH CORONARY ANGIOGRAM N/A 10/23/2012   Procedure: LEFT HEART CATHETERIZATION WITH CORONARY ANGIOGRAM;  Surgeon: Peter M Martinique, MD;  Location: [facility] CATH LAB;  Service: Cardiovascular;  Laterality: N/A;   OVARIAN CYST REMOVAL     prior to hysterectomy, right   Ray cage fusion  [place]  10/30/2002   right    FAMILY HISTORY:  Family History  Problem Relation Age of Onset   Diabetes Mother    Pancreatic cancer Mother 55   Lung cancer Father    Diabetes Father    Heart disease Father    Esophageal cancer Father 57       Beer drinker   Brain cancer Brother 11   Brain cancer Maternal Grandfather    Heart disease Paternal Grandfather    Cancer Maternal Uncle        unknown form of cancer; cigar smoker   Lung cancer Maternal Grandmother    Brain cancer Cousin        dx in his late 60s-40s   Schizophrenia Sister    Lymphoma Niece 36   Anesthesia problems Neg Hx    Hypotension Neg Hx    Malignant hyperthermia Neg Hx    Pseudochol deficiency Neg Hx    Colon cancer Neg Hx    Colon polyps Neg Hx    Crohn's disease Neg Hx    Ulcerative colitis Neg Hx     SOCIAL HISTORY:  Social History   Socioeconomic History   Marital status: Married    Spouse name: Not on file   Number of children: 2   Years of education: Not on file   Highest education level: Not on file  Occupational History   Occupation: Water engineer: [employer]    Comment: [employer]  Social Designer, fashion/clothing strain: Not on file   Food insecurity    Worry: Not on file    Inability: Not on file   Transportation needs    Medical: Not on file    Non-medical: Not on file  Tobacco Use   Smoking status: Current Every Day Smoker    Packs/day: 0.50    Years: 35.00    Pack years: 17.50    Types: Cigarettes    Start date: 07/07/1974   Smokeless tobacco: Never Used  Substance and Sexual Activity   Alcohol use: Yes    Alcohol/week: 0.0 standard drinks    Comment: Rare( stopped July 26, 2014)   Drug use: No   Sexual activity:  Not Currently    Birth control/protection: Surgical  Lifestyle   Physical activity    Days per week: Not on file    Minutes per session: Not on file   Stress: Not on file  Relationships   Social connections    Talks on phone: Not on file    Gets together: Not on file    Attends religious service: Not on file    Active member of club or organization: Not on file    Attends meetings of clubs or organizations: Not on file    Relationship status: Not on file  Other Topics Concern   Not on file  Social History Narrative   Not on file    ALLERGIES:  Allergies  Allergen Reactions   Bee Venom Anaphylaxis   Haloperidol Lactate Other (See Comments)    Convulsions    Meperidine Hcl Hives   Xifaxan [Rifaximin] Other (See Comments)    Multiple complaints     MEDICATIONS:  Current Outpatient Medications  Medication Sig Dispense Refill   ALPRAZolam (XANAX) 0.5 MG tablet Take 1 tablet (0.5 mg total) by mouth daily. 90 tablet 2   anastrozole (ARIMIDEX) 1 MG tablet Take 1 tablet (1 mg total) by mouth daily. 30 tablet 2   Calcium-Magnesium-Vitamin D (CALCIUM 500 PO) Take 1 tablet by mouth 2 (two) times daily.     diclofenac sodium (VOLTAREN) 1 % GEL Apply 1 application topically 3 (three) times daily as needed.     EPINEPHrine (EPI-PEN) 0.3 mg/0.3 mL DEVI Inject 0.3 mg into  the muscle as needed. For bee venom     fish oil-omega-3 fatty acids 1000 MG capsule Take 2 g by mouth every morning.     FOLIC ACID PO Take 1 mg by mouth 2 (two) times daily.      hydrochlorothiazide (MICROZIDE) 12.5 MG capsule TAKE 1 CAPSULE DAILY 90 capsule 3   methotrexate (RHEUMATREX) 2.5 MG tablet Take 10 mg by mouth once a week.     metoprolol succinate (TOPROL-XL) 50 MG 24 hr tablet TAKE 1 TABLET TWICE A DAY  WITH OR IMMEDIATELY        FOLLOWING A MEAL. 180 tablet 3   Multiple Vitamin (MULITIVITAMIN WITH MINERALS) TABS Take 1 tablet by mouth every morning.     NIFEdipine (PROCARDIA XL/NIFEDICAL-XL) 90 MG 24 hr tablet TAKE 1 TABLET DAILY 90 tablet 3   nitroGLYCERIN (NITROSTAT) 0.4 MG SL tablet Place 1 tablet (0.4 mg total) under the tongue every 5 (five) minutes x 3 doses as needed for chest pain. 25 tablet 3   pantoprazole (PROTONIX) 40 MG tablet TAKE 1 TABLET DAILY 30 tablet 6   Probiotic Product (PHILLIPS COLON HEALTH PO) Take 1 capsule by mouth daily.     sodium chloride (MURO 128) 5 % ophthalmic solution Place 1 drop into both eyes every 3 (three) hours as needed.      No current facility-administered medications for this encounter.     REVIEW OF SYSTEMS: A 10+ POINT REVIEW OF SYSTEMS WAS OBTAINED including neurology, dermatology, psychiatry, cardiac, respiratory, lymph, extremities, GI, GU, musculoskeletal, constitutional, reproductive, HEENT. On the provided form, she reports wearing glasses, breast lump, joint pain, arthritis, and anxiety. She denies unusual headaches, visual changes, nausea, vomiting, stiff neck, dizziness, or gait imbalance. There has been no cough, phlegm production, or pleurisy, no chest pain or pressure, and no change in bowel or bladder habits. The patient denies fever, rash, bleeding, unexplained fatigue or unexplained weight loss. A detailed review of  systems was otherwise entirely negative.  and any other symptoms.    PHYSICAL EXAM:  Vitals with  BMI 07/08/2019  Height 5' 4"   Weight 171 lbs 14 oz  BMI 82.42  Systolic 353  Diastolic 73  Pulse 78  Respirations 16  Lungs are clear to auscultation bilaterally. Heart has regular rate and rhythm. No palpable cervical, supraclavicular, or axillary adenopathy. Abdomen soft, non-tender, normal bowel sounds. Breast: left breast with no palpable mass, nipple discharge, or bleeding. Right breast patient has an area of palpable induration in the 10 o'clock position approximately 1.5-2 cm in size, bruising noted from her recent biopsy.   ECOG = 1  0 - Asymptomatic (Fully active, able to carry on all predisease activities without restriction)  1 - Symptomatic but completely ambulatory (Restricted in physically strenuous activity but ambulatory and able to carry out work of a light or sedentary nature. For example, light housework, office work)  2 - Symptomatic, <50% in bed during the day (Ambulatory and capable of all self care but unable to carry out any work activities. Up and about more than 50% of waking hours)  3 - Symptomatic, >50% in bed, but not bedbound (Capable of only limited self-care, confined to bed or chair 50% or more of waking hours)  4 - Bedbound (Completely disabled. Cannot carry on any self-care. Totally confined to bed or chair)  5 - Death   Eustace Pen MM, Creech RH, Tormey DC, et al. [phone]). "Toxicity and response criteria of the [facility]". [place] Oncol. 5 (6): 649-55  LABORATORY DATA:  Lab Results  Component Value Date   WBC 10.4 07/08/2019   HGB 14.0 07/08/2019   HCT 40.8 07/08/2019   MCV 91.3 07/08/2019   PLT 378 07/08/2019   Lab Results  Component Value Date   NA 140 07/08/2019   K 4.0 07/08/2019   CL 104 07/08/2019   CO2 26 07/08/2019   Lab Results  Component Value Date   ALT 26 07/08/2019   AST 27 07/08/2019   ALKPHOS 103 07/08/2019   BILITOT 0.9 07/08/2019    PULMONARY FUNCTION TEST:   Recent Review Flowsheet Data     There is no flowsheet data to display.      RADIOGRAPHY: US Breast Ltd Uni Right Inc Axilla  Result Date: 07/06/2019 CLINICAL DATA:  58 year old female complaining of a palpable abnormality in the right breast. History of right breast cancer status post lumpectomy in 2003. EXAM: DIGITAL DIAGNOSTIC BILATERAL MAMMOGRAM WITH CAD AND TOMO ULTRASOUND RIGHT BREAST COMPARISON:  Previous exam(s). ACR Breast Density Category c: The breast tissue is heterogeneously dense, which may obscure small masses. FINDINGS: No suspicious mass or malignant type microcalcifications identified in the left breast. In the posterior aspect of the upper-outer quadrant of the right breast is a spiculated mass measuring 1.8 cm associated with punctate calcifications. Surgical clips are seen in the right axilla and there is a developing 6 mm mass adjacent to the clips. Mammographic images were processed with CAD. On physical exam, I palpate a discrete area of thickening in the right breast at 9:30 9 cm from the nipple. Targeted ultrasound is performed, showing an irregular hypoechoic mass in the right breast at 9:30 9 cm from the nipple measuring 1.3 x 1.3 x 1.1 cm. Sonographic evaluation of the right axilla shows a hypoechoic mass measuring 5 x 4 x 4 mm. IMPRESSION: Suspicious mass in the 9:30 region of the right breast and suspicious mass in the right  axilla. RECOMMENDATION: Ultrasound-guided core biopsies of the mass in the right breast and the mass in the right axilla is recommended. I have discussed the findings and recommendations with the patient. Results were also provided in writing at the conclusion of the visit. If applicable, a reminder letter will be sent to the patient regarding the next appointment. BI-RADS CATEGORY  5: Highly suggestive of malignancy. Electronically Signed   By: Lillia Mountain M.D.   On: 07/01/2019 12:00   Mm Diag Breast Tomo Bilateral  Result Date: 07/01/2019 CLINICAL DATA:  58 year old female complaining  of a palpable abnormality in the right breast. History of right breast cancer status post lumpectomy in 2003. EXAM: DIGITAL DIAGNOSTIC BILATERAL MAMMOGRAM WITH CAD AND TOMO ULTRASOUND RIGHT BREAST COMPARISON:  Previous exam(s). ACR Breast Density Category c: The breast tissue is heterogeneously dense, which may obscure small masses. FINDINGS: No suspicious mass or malignant type microcalcifications identified in the left breast. In the posterior aspect of the upper-outer quadrant of the right breast is a spiculated mass measuring 1.8 cm associated with punctate calcifications. Surgical clips are seen in the right axilla and there is a developing 6 mm mass adjacent to the clips. Mammographic images were processed with CAD. On physical exam, I palpate a discrete area of thickening in the right breast at 9:30 9 cm from the nipple. Targeted ultrasound is performed, showing an irregular hypoechoic mass in the right breast at 9:30 9 cm from the nipple measuring 1.3 x 1.3 x 1.1 cm. Sonographic evaluation of the right axilla shows a hypoechoic mass measuring 5 x 4 x 4 mm. IMPRESSION: Suspicious mass in the 9:30 region of the right breast and suspicious mass in the right axilla. RECOMMENDATION: Ultrasound-guided core biopsies of the mass in the right breast and the mass in the right axilla is recommended. I have discussed the findings and recommendations with the patient. Results were also provided in writing at the conclusion of the visit. If applicable, a reminder letter will be sent to the patient regarding the next appointment. BI-RADS CATEGORY  5: Highly suggestive of malignancy. Electronically Signed   By: Lillia Mountain M.D.   On: 07/01/2019 12:00   [place] Axillary Node Core Biopsy Right  Addendum Date: 07/06/2019   ADDENDUM REPORT: 07/06/2019 11:47 ADDENDUM: Pathology revealed GRADE I-II INVASIVE DUCTAL CARCINOMA, INTERMEDIATE GRADE DUCTAL CARCINOMA IN SITU of the 9:30 o'clock position. This was found to be concordant  by Dr. Claudie Revering. Pathology revealed LYMPH NODE, NEGATIVE FOR CARCINOMA of the RIGHT axilla. This was found to be concordant by Dr. Claudie Revering. Pathology results were discussed with the patient by telephone. The patient reported doing well after the biopsy with tenderness at the site. Post biopsy instructions and care were reviewed and questions were answered. The patient was encouraged to call [facility] for any additional concerns. The patient was referred to [facility] on July 08, 2019. Pathology results reported by Stacie Acres, RN on 07/06/2019. Electronically Signed   By: Claudie Revering M.D.   On: 07/06/2019 11:47   Result Date: 07/06/2019 CLINICAL DATA:  1.3 cm mass highly suspicious for malignancy in the 9:30 o'clock position of the right breast and 5 mm right axillary mass suspicious for malignancy at recent mammography and ultrasound. Previous right lumpectomy for breast cancer in 2003 with radiation therapy and chemotherapy. EXAM: ULTRASOUND GUIDED RIGHT BREAST CORE NEEDLE BIOPSY ULTRASOUND-GUIDED RIGHT AXILLARY CORE NEEDLE BIOPSY COMPARISON:  Previous exam(s). FINDINGS: I met with the patient and we discussed the procedure of ultrasound-guided biopsy, including benefits and alternatives. We discussed the high likelihood of successful procedures. We discussed the risks of the procedure, including infection, bleeding, tissue injury, clip migration, and inadequate sampling. Informed written consent was given. The usual time-out protocol was performed immediately prior to the procedure. LESION #1: 1.3 CM MASS 9:30 O'CLOCK RIGHT BREAST Lesion quadrant: Upper outer quadrant Using sterile technique and 1% Lidocaine as local anesthetic, under direct ultrasound visualization, a 12 gauge spring-loaded device was used to perform biopsy of the recently demonstrated 1.3 cm mass in the 9:30 o'clock position of  the right breast using a caudal approach. At the conclusion of the procedure a ribbon shaped tissue marker clip was deployed into the biopsy cavity. Follow up 2 view mammogram was performed and dictated separately. LESION #2: 5 MM RIGHT AXILLARY MASS Using sterile technique and 1% Lidocaine as local anesthetic, under direct ultrasound visualization, a 12 gauge spring-loaded device was used to perform biopsy of the recently demonstrated 5 mm mass in the right axilla using a caudal approach. At the conclusion of the procedure a spiral shaped HydroMARK tissue marker clip was deployed into the biopsy cavity. Follow up 2 view mammogram was performed and dictated separately. IMPRESSION: Ultrasound guided biopsy of a 1.3 cm mass in the 9:30 o'clock position of the right breast and a 5 mm mass in the right axilla. No apparent complications. Electronically Signed: By: Claudie Revering M.D. On: 07/02/2019 09:44   Mm Clip Placement Right  Result Date: 07/02/2019 CLINICAL DATA:  Status post ultrasound-guided core needle biopsies of a 1.3 cm mass in the 9:30 o'clock position of the right breast and a 5 mm mass in the right axilla. EXAM: DIAGNOSTIC RIGHT MAMMOGRAM POST ULTRASOUND BIOPSY X 2 COMPARISON:  Previous exam(s). FINDINGS: Mammographic images were obtained following ultrasound guided biopsy of the recently demonstrated 1.3 cm mass in the 9:30 o'clock position of the right breast and the 5 mm mass in the right axilla. These demonstrate a ribbon shaped biopsy marker clip in the posterior aspect of the biopsied mass in the 9:30 o'clock position of the breast. There is also a spiral shaped biopsy marker clip at the expected location of the mass biopsied in the right axilla. IMPRESSION: Appropriate deployment of the ribbon shaped and spiral shaped HydroMARK biopsy marker clips following right breast and right axillary ultrasound-guided core needle biopsies. Final Assessment: Post Procedure Mammograms for Marker Placement  Electronically Signed   By: Claudie Revering M.D.   On: 07/02/2019 09:53   [place] Rt Breast Bx W Loc Dev 1st Lesion Img Bx Spec US Guide  Addendum Date: 07/06/2019   ADDENDUM REPORT: 07/06/2019 11:47 ADDENDUM: Pathology revealed GRADE I-II INVASIVE DUCTAL CARCINOMA, INTERMEDIATE GRADE DUCTAL CARCINOMA IN SITU of the 9:30 o'clock position. This was found to be concordant by Dr. Claudie Revering. Pathology revealed LYMPH NODE, NEGATIVE FOR CARCINOMA of the RIGHT axilla. This was found to be concordant by Dr. Claudie Revering. Pathology results were discussed with the patient by telephone. The patient reported doing well after the biopsy with tenderness at the site. Post biopsy instructions and care were reviewed and questions were answered. The patient was encouraged to call [facility] for any additional concerns. The patient was referred to [facility] on July 08, 2019. Pathology results reported by Stacie Acres, RN on 07/06/2019. Electronically Signed  By: Claudie Revering M.D.   On: 07/06/2019 11:47   Result Date: 07/06/2019 CLINICAL DATA:  1.3 cm mass highly suspicious for malignancy in the 9:30 o'clock position of the right breast and 5 mm right axillary mass suspicious for malignancy at recent mammography and ultrasound. Previous right lumpectomy for breast cancer in 2003 with radiation therapy and chemotherapy. EXAM: ULTRASOUND GUIDED RIGHT BREAST CORE NEEDLE BIOPSY ULTRASOUND-GUIDED RIGHT AXILLARY CORE NEEDLE BIOPSY COMPARISON:  Previous exam(s). FINDINGS: I met with the patient and we discussed the procedure of ultrasound-guided biopsy, including benefits and alternatives. We discussed the high likelihood of successful procedures. We discussed the risks of the procedure, including infection, bleeding, tissue injury, clip migration, and inadequate sampling. Informed written consent was given. The usual time-out protocol was  performed immediately prior to the procedure. LESION #1: 1.3 CM MASS 9:30 O'CLOCK RIGHT BREAST Lesion quadrant: Upper outer quadrant Using sterile technique and 1% Lidocaine as local anesthetic, under direct ultrasound visualization, a 12 gauge spring-loaded device was used to perform biopsy of the recently demonstrated 1.3 cm mass in the 9:30 o'clock position of the right breast using a caudal approach. At the conclusion of the procedure a ribbon shaped tissue marker clip was deployed into the biopsy cavity. Follow up 2 view mammogram was performed and dictated separately. LESION #2: 5 MM RIGHT AXILLARY MASS Using sterile technique and 1% Lidocaine as local anesthetic, under direct ultrasound visualization, a 12 gauge spring-loaded device was used to perform biopsy of the recently demonstrated 5 mm mass in the right axilla using a caudal approach. At the conclusion of the procedure a spiral shaped HydroMARK tissue marker clip was deployed into the biopsy cavity. Follow up 2 view mammogram was performed and dictated separately. IMPRESSION: Ultrasound guided biopsy of a 1.3 cm mass in the 9:30 o'clock position of the right breast and a 5 mm mass in the right axilla. No apparent complications. Electronically Signed: By: Claudie Revering M.D. On: 07/02/2019 09:44      IMPRESSION: Stage IA (cT1c, cN0, cM0) Right Breast UOQ, Invasive Ductal Carcinoma with DCIS, ER+ / PR+ / Her2-, Grade 2   Patient would normally be a good candidate for breast conservation with radiotherapy to right breast, but given her previous radiation therapy, this is not the case. Standard of care would be for her to proceed with a mastectomy. She understands this recommendation given her prior right breast radiation treatment.    PLAN:  1. Patient will proceed with right breast mastectomy with sentinel lymph node biopsy if technically possible. Patient is also interested in immediate reconstruction and will be seeing Dr. Marla Roe for  consultation.  2. I discussed with the patient that she will need to stop smoking as soon as possible. She has stopped smoking for past surgeries and feels this would be a possibility for her. 3. Patient will proceed with mammaprint for further risk stratification and adjuvant chemotherpay treatment guiding. 4. Adjuvant hormonal therapy with anastrozole starting today.   ------------------------------------------------  Blair Promise, PhD, MD  This document serves as a record of services personally performed by Gery Pray, MD. It was created on his behalf by Wilburn Mylar, a trained medical scribe. The creation of this record is based on the scribe's personal observations and the provider's statements to them. This document has been checked and approved by the attending provider.

## 2019-07-08 NOTE — Progress Notes (Signed)
REFERRING PROVIDER: Truitt Merle, MD [place],  [place] [address]  PRIMARY PROVIDER:  Janie Morning, DO  PRIMARY REASON FOR VISIT:  1. Malignant neoplasm of upper-outer quadrant of right breast in female, estrogen receptor positive ([place])   2. Family history of brain cancer   3. Family history of pancreatic cancer   4. Family history of lymphoma   5. Family history of esophageal cancer   6. Family history of lung cancer    I connected with Ms. Tolson on 07/08/2019 at 2:15 pm EDT by Webex video conference and verified that I am speaking with the correct person using two identifiers.   Patient location: clinic Provider location: clinic   HISTORY OF PRESENT ILLNESS:   Ms. Peckman, a 58 y.o. female, was seen for a [facility] cancer genetics consultation at the request of Dr. Burr Medico due to a personal history of breast cancer and a family history of pancreatic, brain, lung, esophageal cancer and lymphoma.  Ms. Lewey presents to clinic today to discuss the possibility of a hereditary predisposition to cancer, genetic testing, and to further clarify her future cancer risks, as well as potential cancer risks for family members.   In 2003, at the age of 29, Ms. 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 The treatment plan at the time included surgery, radiation, and chemotherapy.   In 2020, at the age of 67, Ms. 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 The treatment plan includes surgery and antiestrogen therapy.  CANCER HISTORY:  Oncology History  Breast cancer ([place])  10/16/2002 Initial Biopsy   10/16/2002   BREAST, RIGHT, 4 O'CLOCK, NEEDLE BIOPSIES: INVASIVE AND   INTRADUCTAL CARCINOMA.  Results   IMMUNOHISTOCHEMICAL AND MORPHOMETRIC ANALYSIS BY IMAGE CYTOMETRY   (The tumor is immunocytochemically stained for estrogen and   progesterone receptors and Mib-1, and quantitated   morphometrically.)   Estrogen  Receptor (Negative, <1%): NEGATIVE   Progesterone Receptor (Negative, <1%): NEGATIVE   Proliferation Marker Ki67 by MIB-1 (Low <30%): 47%  HERCEPTEST   Her 2 neu by immunocytochemical stain (Negative): 3+;   POSITIVE    10/30/2002 Surgery   Surgery 10/30/02 FINAL DIAGNOSIS    **MICROSCOPIC EXAMINATION AND DIAGNOSIS**    1. SENTINEL LYMPH NODE BIOPSY, RIGHT AXILLARY NODE: ONE LYMPH   NODE, NEGATIVE FOR METASTATIC CARCINOMA (0/1), SEE COMMENT.    2. LUMPECTOMY, RIGHT BREAST:   - INVASIVE MAMMARY CARCINOMA, NO SPECIAL TYPE (DUCTAL), 1.3   CM, MSBR GRADE 3 OF 3.   - DUCTAL CARCINOMA IN SITU, HIGH-GRADE.   - EXTENSIVE INTRADUCTAL COMPONENT NEGATIVE (JCRT).   - NO LYMPHOVASCULAR INVASION IDENTIFIED.   - MARGINS FREE OF TUMOR.   - SEE COMMENT AND ONCOLOGY TABLE.    2003 - 07/2003 Radiation Therapy   Underwent adjuvant radiation to 50.4 gray with an additional 10 gray boost to the surgical cavity completed 07/19/2003     - 02/2004 Chemotherapy   AC for 4 cycles and decetaxel and Herceptin for 4 cycles and then continued herceptin for totla 1 year treatment.      02/22/2012 Initial Diagnosis   Breast cancer ([place] [facility])   Malignant neoplasm of upper-outer quadrant of right breast in female, estrogen receptor positive ([place])  07/01/2019 Mammogram   Diagnostic mammogram 07/01/19  IMPRESSION: Suspicious mass in the 9:30 region of the right breast 9 cm from the nipple measuring 1.3 x 1.3 x 1.1 cm. Suspicious mass in the right axilla measuring 5  x 4 x 4 mm.   07/02/2019 Cancer Staging   Staging form: Breast, AJCC 8th Edition - Clinical stage from 07/02/2019: Stage IA (cT1c, cN0, cM0, G2, ER+, PR+, HER2: Equivocal) - Signed by Truitt Merle, MD on 07/07/2019   07/02/2019 Initial Biopsy   Diagnosis 07/02/19 1. Breast, right, needle core biopsy, 9:30 o'clock - INVASIVE DUCTAL CARCINOMA, GRADE 1-2. SEE NOTE - DUCTAL CARCINOMA IN SITU, INTERMEDIATE GRADE 2. Lymph node, needle/core biopsy, right axilla -  LYMPH NODE, NEGATIVE FOR CARCINOMA   07/02/2019 Receptors her2   By immunohistochemistry, the tumor cells are EQUIVOCAL for Her2 (2+). HER2 by FISH will be PERFORMED and the RESULTS REPORTED SEPARATELY Estrogen Receptor: 100%, POSITIVE, STRONG STAINING INTENSITY Progesterone Receptor: 90%, POSITIVE, STRONG STAINING INTENSITY Proliferation Marker Ki67: 10%   07/06/2019 Initial Diagnosis   Malignant neoplasm of upper-outer quadrant of right breast in female, estrogen receptor positive ([place])       RISK FACTORS:  Menarche was at age 89.  First live birth at age 34.  OCP use for approximately 19 years.  Ovaries intact: yes.  Hysterectomy: yes.  Menopausal status: postmenopausal.  HRT use: 0 years.   Past Medical History:  Diagnosis Date   Anxiety    Arthritis    Breast cancer ([place]) 02/22/2012   Right T1c, N0   C. difficile colitis    Colitis, collagenous 09/22/2015   Depression    Essential hypertension, benign    Family history of brain cancer    Family history of esophageal cancer    Family history of lung cancer    Family history of lymphoma    Family history of pancreatic cancer    Fuchs' corneal dystrophy    GERD (gastroesophageal reflux disease)    Hypertension    Mitral valve prolapse    Mild mitral regurgitation   Mixed hyperlipidemia    Seizures ([place])    Only with dose of haldol; no more since that was stopped.    Past Surgical History:  Procedure Laterality Date   ABDOMINAL HYSTERECTOMY  1999   BIOPSY N/A 09/28/2015   Procedure: BIOPSY;  Surgeon: Danie Binder, MD;  Location: [facility];  Service: Endoscopy;  Laterality: N/A;   BIOPSY  04/05/2016   Procedure: BIOPSY;  Surgeon: Danie Binder, MD;  Location: [facility];  Service: Endoscopy;;  colon bx   BREAST LUMPECTOMY  10/30/2002   right   Carpal tunnel R/L  1992   Cervical spine fusion X2 since last visit     COLONOSCOPY WITH PROPOFOL N/A 09/28/2015   Procedure: COLONOSCOPY  WITH PROPOFOL;  Surgeon: Danie Binder, MD;  Location: [facility];  Service: Endoscopy;  Laterality: N/A;  procedure 1 cecum time in 1232  time out 1246   total time 14 mintes   COLONOSCOPY WITH PROPOFOL N/A 04/05/2016   Procedure: COLONOSCOPY WITH PROPOFOL;  Surgeon: Danie Binder, MD;  Location: [facility];  Service: Endoscopy;  Laterality: N/A;  1200   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   ELBOW SURGERY Right  tennis elbow release 2012   ESOPHAGOGASTRODUODENOSCOPY  10/26/2012   Procedure: ESOPHAGOGASTRODUODENOSCOPY (EGD);  Surgeon: Inda [place], MD;  Location: [facility];  Service: Endoscopy;  Laterality: N/A;   ESOPHAGOGASTRODUODENOSCOPY (EGD) WITH PROPOFOL N/A 09/28/2015   Procedure: ESOPHAGOGASTRODUODENOSCOPY (EGD) WITH PROPOFOL;  Surgeon: Danie Binder, MD;  Location: [facility];  Service: Endoscopy;  Laterality: N/A;   KNEE ARTHROSCOPY Left    LEFT HEART CATHETERIZATION WITH CORONARY ANGIOGRAM  N/A 10/23/2012   Procedure: LEFT HEART CATHETERIZATION WITH CORONARY ANGIOGRAM;  Surgeon: Peter M Martinique, MD;  Location: [facility] CATH LAB;  Service: Cardiovascular;  Laterality: N/A;   OVARIAN CYST REMOVAL     prior to hysterectomy, right   Ray cage fusion  [place]  10/30/2002   right    Social History   Socioeconomic History   Marital status: Married    Spouse name: Not on file   Number of children: 2   Years of education: Not on file   Highest education level: Not on file  Occupational History   Occupation: Aeronautical engineer: [employer]    Comment: [employer]  Social Designer, fashion/clothing strain: Not on file   Food insecurity    Worry: Not on file    Inability: Not on file   Transportation needs    Medical: Not on file    Non-medical: Not on file  Tobacco Use   Smoking status: Current Every Day Smoker    Packs/day: 0.50    Years: 35.00    Pack years: 17.50    Types: Cigarettes    Start date: 07/07/1974   Smokeless tobacco:  Never Used  Substance and Sexual Activity   Alcohol use: Yes    Alcohol/week: 0.0 standard drinks    Comment: Rare( stopped July 26, 2014)   Drug use: No   Sexual activity: Not Currently    Birth control/protection: Surgical  Lifestyle   Physical activity    Days per week: Not on file    Minutes per session: Not on file   Stress: Not on file  Relationships   Social connections    Talks on phone: Not on file    Gets together: Not on file    Attends religious service: Not on file    Active member of club or organization: Not on file    Attends meetings of clubs or organizations: Not on file    Relationship status: Not on file  Other Topics Concern   Not on file  Social History Narrative   Not on file     FAMILY HISTORY:  We obtained a detailed, 4-generation family history.  Significant diagnoses are listed below: Family History  Problem Relation Age of Onset   Diabetes Mother    Pancreatic cancer Mother 49   Lung cancer Father    Diabetes Father    Heart disease Father    Esophageal cancer Father 81       Beer drinker   Brain cancer Brother 72   Brain cancer Maternal Grandfather    Heart disease Paternal Grandfather    Cancer Maternal Uncle        unknown form of cancer; cigar smoker   Lung cancer Maternal Grandmother    Brain cancer Cousin        dx in his late 23s-40s   Schizophrenia Sister    Lymphoma Niece 81   Anesthesia problems Neg Hx    Hypotension Neg Hx    Malignant hyperthermia Neg Hx    Pseudochol deficiency Neg Hx    Colon cancer Neg Hx    Colon polyps Neg Hx    Crohn's disease Neg Hx    Ulcerative colitis Neg Hx     Ms. Cinelli has one son and one daughter. She has one brother who was diagnosed with brain cancer at age 84. She also has a niece who was diagnosed with lymphoma when she  was 31. There have been no other cancer diagnoses among her other siblings or nieces and nephews.  Ms. Welge mother died from  pancreatic cancer at the age of 35. She had a maternal uncle who was diagnosed with an unknown cancer in his late 68s, and a female maternal first cousin who had brain cancer in his 26s. Ms. Zellman maternal grandfather had brain cancer and died in his 52s, and her maternal grandmother had lung cancer.  Ms. Mandrell father had esophageal cancer and died when he was 55. She also has a paternal aunt who had an unknown cancer and died when she was in her 60s.  Ms. Marcotte is unaware of previous family history of genetic testing for hereditary cancer risks. Patient's maternal ancestors are of [place] descent, and paternal ancestors are of [place], [place], and [place] descent. There is no reported Ashkenazi Jewish ancestry. There is no known consanguinity.  GENETIC COUNSELING ASSESSMENT: Ms. Strader is a 58 y.o. female with a personal history of breast cancer and a family history of pancreatic, brain, esophageal, lung cancer, and lymphoma, which is somewhat suggestive of a hereditary cancer syndrome and predisposition to cancer. We, therefore, discussed and recommended the following at today's visit.   DISCUSSION: We discussed that 5 - 10% of pancreatic cancer is hereditary.  While Ms. Bowron has had genetic testing in the past, her testing only covered cancer genes that are related to breast and ovarian cancer.  We know that there are genes associated with pancreatic cancer that were not included in her previous genetic testing, such as CDKN2A and APC.  Additionally, Ms. Jobst has a maternal family history of brain cancer that might indicate a hereditary cause.  We discussed that testing is beneficial for several reasons including knowing about other cancer risks, identifying potential screening and risk-reduction options that may be appropriate, and to understand if other family members could be at risk for cancer and allow them to undergo genetic testing  We reviewed the characteristics, features and  inheritance patterns of hereditary cancer syndromes. We also discussed genetic testing, including the appropriate family members to test, the process of testing, insurance coverage and turn-around-time for results. We discussed the implications of a negative, positive and/or variant of uncertain significant result. We recommended Ms. Abramo pursue genetic testing for the [facility] Multi-Cancer gene panel.   The Multi-Gene Panel offered by [facility] includes sequencing and/or deletion duplication testing of the following 85 genes: AIP, ALK, APC, ATM, AXIN2,BAP1,  BARD1, BLM, BMPR1A, BRCA1, BRCA2, BRIP1, CASR, CDC73, CDH1, CDK4, CDKN1B, CDKN1C, CDKN2A (p14ARF), CDKN2A (p16INK4a), CEBPA, CHEK2, CTNNA1, DICER1, DIS3L2, EGFR (c.2369C>T, p.Thr790Met variant only), EPCAM (Deletion/duplication testing only), FH, FLCN, GATA2, GPC3, GREM1 (Promoter region deletion/duplication testing only), HOXB13 (c.251G>A, p.Gly84Glu), HRAS, KIT, MAX, MEN1, MET, MITF (c.952G>A, p.Glu318Lys variant only), MLH1, MSH2, MSH3, MSH6, MUTYH, NBN, NF1, NF2, NTHL1, PALB2, PDGFRA, PHOX2B, PMS2, POLD1, POLE, POT1, PRKAR1A, PTCH1, PTEN, RAD50, RAD51C, RAD51D, RB1, RECQL4, RET, RNF43, RUNX1, SDHAF2, SDHA (sequence changes only), SDHB, SDHC, SDHD, SMAD4, SMARCA4, SMARCB1, SMARCE1, STK11, SUFU, TERC, TERT, TMEM127, TP53, TSC1, TSC2, VHL, WRN and WT1.    Based on Ms. Steptoe personal and family history of cancer, she meets medical criteria for genetic testing in addition to the genetic testing she had completed previously.  Despite that she meets criteria, she may still have an out of pocket cost.  We discussed that if her out of pocket cost for testing is over $100, the laboratory will call and confirm whether she wants to proceed  with testing.  If the out of pocket cost of testing is less than $100 she will be billed by the genetic testing laboratory.   PLAN: After considering the risks, benefits, and limitations, Ms. Kreitz provided informed  consent to pursue genetic testing and the blood sample was sent to [facility] for analysis of the Multi-Cancer panel. Results should be available within approximately two-three weeks' time, at which point they will be disclosed by telephone to Ms. Krontz, as will any additional recommendations warranted by these results. Ms. Wunschel will receive a summary of her genetic counseling visit and a copy of her results once available. This information will also be available in Epic.   Ms. Winburn questions were answered to her satisfaction today. Our contact information was provided should additional questions or concerns arise. Thank you for the referral and allowing [place] to share in the care of your patient.   Clint Guy, MS Genetic Counselor [email or web address] Phone: [phone]   The patient was seen for a total of 15 minutes in face-to-face genetic counseling.  This patient was discussed with Drs. Magrinat, Lindi Adie and/or Burr Medico who agrees with the above.    _______________________________________________________________________ For Office Staff:  Number of people involved in session: 1 Was an Intern/ student involved with case: no

## 2019-07-09 ENCOUNTER — Telehealth: Payer: Self-pay | Admitting: Hematology

## 2019-07-09 NOTE — Telephone Encounter (Signed)
No los per 7/22.

## 2019-07-13 ENCOUNTER — Telehealth: Payer: Self-pay | Admitting: Plastic Surgery

## 2019-07-13 NOTE — Telephone Encounter (Signed)

## 2019-07-14 ENCOUNTER — Ambulatory Visit: Payer: Commercial Managed Care - PPO | Admitting: Plastic Surgery

## 2019-07-14 ENCOUNTER — Other Ambulatory Visit: Payer: Self-pay

## 2019-07-14 ENCOUNTER — Encounter: Payer: Self-pay | Admitting: Plastic Surgery

## 2019-07-14 VITALS — BP 127/79 | HR 82 | Temp 97.8°F | Ht 64.0 in | Wt 174.2 lb

## 2019-07-14 DIAGNOSIS — Z170 Estrogen receptor positive status [ER+]: Secondary | ICD-10-CM | POA: Diagnosis not present

## 2019-07-14 DIAGNOSIS — Z720 Tobacco use: Secondary | ICD-10-CM | POA: Diagnosis not present

## 2019-07-14 DIAGNOSIS — C50411 Malignant neoplasm of upper-outer quadrant of right female breast: Secondary | ICD-10-CM | POA: Diagnosis not present

## 2019-07-14 NOTE — Progress Notes (Addendum)
Patient ID: Rachel Sutton, female    DOB: 06-Feb-1961, 58 y.o.   MRN: [id number]   Chief Complaint  Patient presents with  . Advice Only    for (R) mastectomy w/ reconstruction  . Breast Problem    The patient is a 58 yrs old wf here for consultation for breast reconstruction.  She was diagnosed with right breast invasive carcinoma.  She was treated for right breast cancer in 2003 with a lumpectomy, chemotherapy and radiation.  She is being treated with methotrexate and Cimzia for RA.  She has held the medications since July.  She had a hysterectomy and several orthopedic surgeries.  She has hypertension, hyperlipidemia and depression.  She has a strong family history of cancers.  She is 5 feet 4 inches tall and weighs 174 pounds.   Review of Systems  Constitutional: Negative.  Negative for activity change and appetite change.  HENT: Negative for congestion.   Eyes: Negative.  Negative for discharge.  Respiratory: Negative for chest tightness and shortness of breath.   Cardiovascular: Negative for leg swelling.  Gastrointestinal: Negative for abdominal pain.  Endocrine: Negative.   Genitourinary: Negative.   Musculoskeletal: Negative for joint swelling.  Skin: Negative for color change and wound.  Hematological: Negative.   Psychiatric/Behavioral: Negative.     Past Medical History:  Diagnosis Date  . Anxiety   . Arthritis   . Breast cancer ([place]) 02/22/2012   Right T1c, N0  . C. difficile colitis   . Colitis, collagenous 09/22/2015  . Depression   . Essential hypertension, benign   . Family history of brain cancer   . Family history of esophageal cancer   . Family history of lung cancer   . Family history of lymphoma   . Family history of pancreatic cancer   . Fuchs' corneal dystrophy   . GERD (gastroesophageal reflux disease)   . Hypertension   . Mitral valve prolapse    Mild mitral regurgitation  . Mixed hyperlipidemia   . Seizures ([place])    Only with dose of  haldol; no more since that was stopped.    Past Surgical History:  Procedure Laterality Date  . ABDOMINAL HYSTERECTOMY  1999  . BIOPSY N/A 09/28/2015   Procedure: BIOPSY;  Surgeon: Danie Binder, MD;  Location: [facility];  Service: Endoscopy;  Laterality: N/A;  . BIOPSY  04/05/2016   Procedure: BIOPSY;  Surgeon: Danie Binder, MD;  Location: [facility];  Service: Endoscopy;;  colon bx  . BREAST LUMPECTOMY  10/30/2002   right  . Carpal tunnel R/L  1992  . Cervical spine fusion X2 since last visit    . COLONOSCOPY WITH PROPOFOL N/A 09/28/2015   Procedure: COLONOSCOPY WITH PROPOFOL;  Surgeon: Danie Binder, MD;  Location: [facility];  Service: Endoscopy;  Laterality: N/A;  procedure 1 cecum time in 1232  time out 1246   total time 14 mintes  . COLONOSCOPY WITH PROPOFOL N/A 04/05/2016   Procedure: COLONOSCOPY WITH PROPOFOL;  Surgeon: Danie Binder, MD;  [facility]: [facility];  Service: Endoscopy;  Laterality: N/A;  1200  . 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 ELBOW SURGERY Right  tennis elbow release 2012  . ESOPHAGOGASTRODUODENOSCOPY  10/26/2012   Procedure: ESOPHAGOGASTRODUODENOSCOPY (EGD);  Surgeon: Inda Castle, MD;  Location: [facility];  Service: Endoscopy;  Laterality: N/A;  . ESOPHAGOGASTRODUODENOSCOPY (EGD) WITH PROPOFOL N/A 09/28/2015   Procedure: ESOPHAGOGASTRODUODENOSCOPY (EGD) WITH PROPOFOL;  Surgeon: Carlyon Prows  Rexene Edison, MD;  Location: [facility];  Service: Endoscopy;  Laterality: N/A;  . KNEE ARTHROSCOPY Left   . LEFT HEART CATHETERIZATION WITH CORONARY ANGIOGRAM N/A 10/23/2012   Procedure: LEFT HEART CATHETERIZATION WITH CORONARY ANGIOGRAM;  Surgeon: Peter M Martinique, MD;  Location: [facility] CATH LAB;  Service: Cardiovascular;  Laterality: N/A;  . OVARIAN CYST REMOVAL     prior to hysterectomy, right  . Ray cage fusion  1997  . SENTINEL LYMPH NODE BIOPSY  10/30/2002   right      Current Outpatient Medications:  .  ALPRAZolam (XANAX) 0.5 MG tablet, Take 1 tablet (0.5 mg total)  by mouth daily., Disp: 90 tablet, Rfl: 2 .  anastrozole (ARIMIDEX) 1 MG tablet, Take 1 tablet (1 mg total) by mouth daily., Disp: 30 tablet, Rfl: 2 .  Calcium-Magnesium-Vitamin D (CALCIUM 500 PO), Take 1 tablet by mouth 2 (two) times daily., Disp: , Rfl:  .  diclofenac sodium (VOLTAREN) 1 % GEL, Apply 1 application topically 3 (three) times daily as needed., Disp: , Rfl:  .  EPINEPHrine (EPI-PEN) 0.3 mg/0.3 mL DEVI, Inject 0.3 mg into the muscle as needed. For bee venom, Disp: , Rfl:  .  fish oil-omega-3 fatty acids 1000 MG capsule, Take 2 g by mouth every morning., Disp: , Rfl:  .  FOLIC ACID PO, Take 1 mg by mouth 2 (two) times daily. , Disp: , Rfl:  .  hydrochlorothiazide (MICROZIDE) 12.5 MG capsule, TAKE 1 CAPSULE DAILY, Disp: 90 capsule, Rfl: 3 .  methotrexate (RHEUMATREX) 2.5 MG tablet, Take 10 mg by mouth once a week., Disp: , Rfl:  .  metoprolol succinate (TOPROL-XL) 50 MG 24 hr tablet, TAKE 1 TABLET TWICE A DAY  WITH OR IMMEDIATELY        FOLLOWING A MEAL., Disp: 180 tablet, Rfl: 3 .  Multiple Vitamin (MULITIVITAMIN WITH MINERALS) TABS, Take 1 tablet by mouth every morning., Disp: , Rfl:  .  NIFEdipine (PROCARDIA XL/NIFEDICAL-XL) 90 MG 24 hr tablet, TAKE 1 TABLET DAILY, Disp: 90 tablet, Rfl: 3 .  nitroGLYCERIN (NITROSTAT) 0.4 MG SL tablet, Place 1 tablet (0.4 mg total) under the tongue every 5 (five) minutes x 3 doses as needed for chest pain., Disp: 25 tablet, Rfl: 3 .  pantoprazole (PROTONIX) 40 MG tablet, TAKE 1 TABLET DAILY, Disp: 30 tablet, Rfl: 6 .  Probiotic Product (PHILLIPS COLON HEALTH PO), Take 1 capsule by mouth daily., Disp: , Rfl:  .  sodium chloride (MURO 128) 5 % ophthalmic solution, Place 1 drop into both eyes every 3 (three) hours as needed. , Disp: , Rfl:    Objective:   Vitals:   07/14/19 1348  BP: 127/79  Pulse: 82  Temp: 97.8 F (36.6 C)  SpO2: 96%    Physical Exam Vitals signs reviewed.  Constitutional:      Appearance: Normal appearance.  HENT:      Head: Normocephalic and atraumatic.  Eyes:     Extraocular Movements: Extraocular movements intact.  Neck:     Musculoskeletal: Normal range of motion.  Cardiovascular:     Rate and Rhythm: Normal rate.     Pulses: Normal pulses.  Pulmonary:     Effort: Pulmonary effort is normal. No respiratory distress.  Abdominal:     General: Abdomen is flat. There is no distension.     Tenderness: There is no abdominal tenderness.  Skin:    General: Skin is warm.  Neurological:     General: No focal deficit present.  Mental Status: She is alert and oriented to person, place, and time.  Psychiatric:        Mood and Affect: Mood normal.        Behavior: Behavior normal.        Thought Content: Thought content normal.     Assessment & Plan:     ICD-10-CM   1. Tobacco abuse  Z72.0   2. Malignant neoplasm of upper-outer quadrant of right breast in female, estrogen receptor positive ([place])  C50.411    Z17.0        She needs to stop smoking before we can do a muscle flap. Pictures were obtained of the patient and placed in the chart with the patient's or guardian's permission. Assessment and Plan:  A long, detailed conversation was had regarding the patient's options for breast reconstruction. Five main points, which are explained to all breast reconstruction patients, were discussed.  1. Breast reconstruction is an optional process.  2. Breast reconstruction is a multi-stage process which involves multiple surgeries spaced several months apart. The entire process can take over one year.  3. The major goal of breast reconstruction is to have the patient look normal in clothing. When naked, there will always be scars.  4. Asymmetries are often present during the reconstruction process. Several operations may be needed, including surgery to the non-cancerous breast, to achieve satisfactory results.  5. No matter the reconstructive method, there are ways that the reconstruction can fail and a  secondary reconstructive plan would need to be created.   A general discussion regarding all available methods of breast reconstruction were discussed. The types of reconstructions described included.  1. Tissue expander and implant based reconstruction, both single and multi-stage approaches.  2. Autologous only reconstructions, including free abdominal-tissue based reconstructions.  3. Combination procedures, particularly latissismus dorsi flaps combined with either expanders or implants.  For each of the reconstruction methods mentioned above, the risks, benefits, alternatives, scarring, and recovery time were discussed in great detail. Specific risks detailed included bleeding, infection, hematoma, seroma, scarring, pain, wound healing complications, flap loss, fat necrosis, capsular contracture, need for implant removal, donor site complications, bulge, hernia, umbilical necrosis, need for urgent reoperation, and need for dressing changes were discussed.   Assessment  Once all reconstruction options were presented, a focused discussion was had regarding the patient's suitability for each of these procedures.  A total of 50 minutes of face-to-face time was spent in this encounter, of which >50% was spent in counseling.  Due to the previous radiation she will autologous reconstruction.  We discussed the options as described above and she has decided on a latissimus muscle flap with expander.  We discussed timing delayed and immediate.  She would like to try for immediate reconstruction.  She is aware that if she still has nicotine in her system that we will not be able to do the flap at the time of the mastectomy and would only do expander placement.  We will plan on checking a nicotine a few days prior to surgery.  The backup plan would be expander placement only.  The reconstruction would then be done when she is tobacco free.  She understands is a staged procedure the expansion will be done and  then the exchange.  She can have the left mastopexy at the same time as the exchange.  I spoke with Dr. Marlou Starks about the above information.  Laurie, DO

## 2019-07-15 ENCOUNTER — Encounter: Payer: Self-pay | Admitting: General Practice

## 2019-07-15 NOTE — Progress Notes (Signed)
[place] Psychosocial Distress Screening Spiritual Care  Met with Rachel Sutton by phone following Breast Multidisciplinary Clinic to introduce [facility] team/resources, reviewing distress screen per protocol.  The patient scored a 4 on the Psychosocial Distress Thermometer which indicates moderate distress. Also assessed for distress and other psychosocial needs.   ONCBCN DISTRESS SCREENING 07/15/2019  Screening Type Initial Screening  Distress experienced in past week (1-10) 4  Emotional problem type Nervousness/Anxiety  Information Concerns Type Lack of info about diagnosis  Physical Problem type Pain  Referral to support programs Yes   Rachel Sutton has "transferrable skills" for coping after her first cancer experience 17y ago. Per pt, she was not surprised by mastectomy recommendation, which also helps her emotional processing. Work is also supportive.  Follow up needed: No. Per pt, no other needs at this time. She is aware of ongoing [facility] team/programming availability, but please also page if needs arise or circumstances change. Thank you.   [place], [place], [facility] Pager [phone] Voicemail [phone]

## 2019-07-16 ENCOUNTER — Telehealth: Payer: Self-pay | Admitting: *Deleted

## 2019-07-16 NOTE — Telephone Encounter (Signed)
Spoke to pt concerning [place] from 7.22.20. Denies questions or concerns regarding dx or treatment care plan. Relate reconstruction consults went well and pt must stop smoking x6wks. Informed she has not smoked in several days. Gave emotional support. Encourage pt to call with needs. Received verbal understanding.

## 2019-07-20 ENCOUNTER — Encounter: Payer: Self-pay | Admitting: Genetic Counselor

## 2019-07-20 ENCOUNTER — Telehealth: Payer: Self-pay | Admitting: Genetic Counselor

## 2019-07-20 ENCOUNTER — Ambulatory Visit: Payer: Self-pay | Admitting: Genetic Counselor

## 2019-07-20 DIAGNOSIS — Z1379 Encounter for other screening for genetic and chromosomal anomalies: Secondary | ICD-10-CM

## 2019-07-20 NOTE — Progress Notes (Signed)
HPI:  Ms. Barcenas was previously seen in the Breast Multidisciplinary clinic due to a personal history of breast cancer and a family history of pancreatic, brain, esophageal cancer and lymphoma, and concerns regarding a hereditary predisposition to cancer. Please refer to our prior cancer genetics clinic note for more information regarding our discussion, assessment and recommendations, at the time. Ms. Sahagian recent genetic test results were disclosed to her, as were recommendations warranted by these results. These results and recommendations are discussed in more detail below.  CANCER HISTORY:  Oncology History  Breast cancer ([place])  10/16/2002 Initial Biopsy   10/16/2002   BREAST, RIGHT, 4 O'CLOCK, NEEDLE BIOPSIES: INVASIVE AND   INTRADUCTAL CARCINOMA.  Results   IMMUNOHISTOCHEMICAL AND MORPHOMETRIC ANALYSIS BY IMAGE CYTOMETRY   (The tumor is immunocytochemically stained for estrogen and   progesterone receptors and Mib-1, and quantitated   morphometrically.)   Estrogen Receptor (Negative, <1%): NEGATIVE   Progesterone Receptor (Negative, <1%): NEGATIVE   Proliferation Marker Ki67 by MIB-1 (Low <30%): 47%  HERCEPTEST   Her 2 neu by immunocytochemical stain (Negative): 3+;   POSITIVE    10/30/2002 Surgery   Surgery 10/30/02 FINAL DIAGNOSIS    1. SENTINEL LYMPH NODE BIOPSY, RIGHT AXILLARY NODE: ONE LYMPH   NODE, NEGATIVE FOR METASTATIC CARCINOMA (0/1), SEE COMMENT.    2. LUMPECTOMY, RIGHT BREAST:   - INVASIVE MAMMARY CARCINOMA, NO SPECIAL TYPE (DUCTAL), 1.3   CM, MSBR GRADE 3 OF 3.   - DUCTAL CARCINOMA IN SITU, HIGH-GRADE.   - EXTENSIVE INTRADUCTAL COMPONENT NEGATIVE (JCRT).   - NO LYMPHOVASCULAR INVASION IDENTIFIED.   - MARGINS FREE OF TUMOR.   - SEE COMMENT AND ONCOLOGY TABLE.    2003 - 07/2003 Radiation Therapy   Underwent adjuvant radiation to 50.4 gray with an additional 10 gray boost to the surgical cavity completed 07/19/2003     - 02/2004 Chemotherapy   AC for 4  cycles and decetaxel and Herceptin for 4 cycles and then continued herceptin for totla 1 year treatment.      02/22/2012 Initial Diagnosis   Breast cancer ([place])   Malignant neoplasm of upper-outer quadrant of right breast in female, estrogen receptor positive ([place])  07/01/2019 Mammogram   Diagnostic mammogram 07/01/19  IMPRESSION: Suspicious mass in the 9:30 region of the right breast 9 cm from the nipple measuring 1.3 x 1.3 x 1.1 cm. Suspicious mass in the right axilla measuring 5 x 4 x 4 mm.   07/02/2019 Cancer Staging   Staging form: Breast, AJCC 8th Edition - Clinical stage from 07/02/2019: Stage IA (cT1c, cN0, cM0, G2, ER+, PR+, HER2: Equivocal) - Signed by Truitt Merle, MD on 07/07/2019   07/02/2019 Initial Biopsy   Diagnosis 07/02/19 1. Breast, right, needle core biopsy, 9:30 o'clock - INVASIVE DUCTAL CARCINOMA, GRADE 1-2. SEE NOTE - DUCTAL CARCINOMA IN SITU, INTERMEDIATE GRADE 2. Lymph node, needle/core biopsy, right axilla - LYMPH NODE, NEGATIVE FOR CARCINOMA   07/02/2019 Receptors her2   By immunohistochemistry, the tumor cells are EQUIVOCAL for Her2 (2+). HER2 by [place] will be PERFORMED and the RESULTS REPORTED SEPARATELY Estrogen Receptor: 100%, POSITIVE, STRONG STAINING INTENSITY Progesterone Receptor: 90%, POSITIVE, STRONG STAINING INTENSITY Proliferation Marker Ki67: 10%   07/06/2019 Initial Diagnosis   Malignant neoplasm of upper-outer quadrant of right breast in female, estrogen receptor positive ([place])   07/08/2019 -  Anti-estrogen oral therapy   Anastrozole 59m once daily starting 07/08/19 before surgery.    07/20/2019 Genetic Testing   [facility]. The report date is 07/20/2019.  The Multi-Gene Panel offered by [facility] includes sequencing and/or deletion duplication testing of the following 85 genes: AIP, ALK, APC, ATM, AXIN2,BAP1,  BARD1, BLM, BMPR1A, BRCA1, BRCA2, BRIP1, CASR, CDC73, CDH1, CDK4, CDKN1B, CDKN1C, CDKN2A (p14ARF),  CDKN2A (p16INK4a), CEBPA, CHEK2, CTNNA1, DICER1, DIS3L2, EGFR (c.2369C>T, p.Thr790Met variant only), EPCAM (Deletion/duplication testing only), FH, FLCN, GATA2, GPC3, GREM1 (Promoter region deletion/duplication testing only), HOXB13 (c.251G>A, p.Gly84Glu), HRAS, KIT, MAX, MEN1, MET, MITF (c.952G>A, p.Glu318Lys variant only), MLH1, MSH2, MSH3, MSH6, MUTYH, NBN, NF1, NF2, NTHL1, PALB2, PDGFRA, PHOX2B, PMS2, POLD1, POLE, POT1, PRKAR1A, PTCH1, PTEN, RAD50, RAD51C, RAD51D, RB1, RECQL4, RET, RNF43, RUNX1, SDHAF2, SDHA (sequence changes only), SDHB, SDHC, SDHD, SMAD4, SMARCA4, SMARCB1, SMARCE1, STK11, SUFU, TERC, TERT, TMEM127, TP53, TSC1, TSC2, VHL, WRN and WT1.       FAMILY HISTORY:  We obtained a detailed, 4-generation family history.  Significant diagnoses are listed below: Family History  Problem Relation Age of Onset   Diabetes Mother    Pancreatic cancer Mother 5   Lung cancer Father    Diabetes Father    Heart disease Father    Esophageal cancer Father 13       Beer drinker   Brain cancer Brother 37   Brain cancer Maternal Grandfather    Heart disease Paternal Grandfather    Cancer Maternal Uncle        unknown form of cancer; cigar smoker   Lung cancer Maternal Grandmother    Brain cancer Cousin        dx in his late 69s-40s   Schizophrenia Sister    Lymphoma Niece 97   Anesthesia problems Neg Hx    Hypotension Neg Hx    Malignant hyperthermia Neg Hx    Pseudochol deficiency Neg Hx    Colon cancer Neg Hx    Colon polyps Neg Hx    Crohn's disease Neg Hx    Ulcerative colitis Neg Hx      Ms. Branden has one son and one daughter. She has one brother who was diagnosed with brain cancer at age 36. She also has a niece who was diagnosed with lymphoma when she was 30. There have been no other cancer diagnoses among her other siblings or nieces and nephews.  Ms. Liwanag mother died from pancreatic cancer at the age of 23. She had a maternal uncle who was  diagnosed with an unknown cancer in his late 43s, and a female maternal first cousin who had brain cancer in his 74s. Ms. Blue maternal grandfather had brain cancer and died in his 60s, and her maternal grandmother had lung cancer.  Ms. Kimmey father had esophageal cancer and died when he was 39. She also has a paternal aunt who had an unknown cancer and died when she was in her 3s.  Ms. Barraclough is unaware of previous family history of genetic testing for hereditary cancer risks. Patient's maternal ancestors are of [place] descent, and paternal ancestors are of [place], [place], and [place] descent. There is no reported Ashkenazi Jewish ancestry. There is no known consanguinity.  GENETIC TEST RESULTS: Genetic testing reported out on 07/20/2019 through the [facility] Multi-Cancer panel found no pathogenic variants. The Multi-Gene Panel offered by [facility] includes sequencing and/or deletion duplication testing of the following 85 genes: AIP, ALK, APC, ATM, AXIN2,BAP1,  BARD1, BLM, BMPR1A, BRCA1, BRCA2, BRIP1, CASR, CDC73, CDH1, CDK4, CDKN1B, CDKN1C, CDKN2A (p14ARF), CDKN2A (p16INK4a), CEBPA, CHEK2, CTNNA1, DICER1, DIS3L2, EGFR (c.2369C>T, p.Thr790Met variant only), EPCAM (Deletion/duplication testing only), FH,  FLCN, GATA2, GPC3, GREM1 (Promoter region deletion/duplication testing only), HOXB13 (c.251G>A, p.Gly84Glu), HRAS, KIT, MAX, MEN1, MET, MITF (c.952G>A, p.Glu318Lys variant only), MLH1, MSH2, MSH3, MSH6, MUTYH, NBN, NF1, NF2, NTHL1, PALB2, PDGFRA, PHOX2B, PMS2, POLD1, POLE, POT1, PRKAR1A, PTCH1, PTEN, RAD50, RAD51C, RAD51D, RB1, RECQL4, RET, RNF43, RUNX1, SDHAF2, SDHA (sequence changes only), SDHB, SDHC, SDHD, SMAD4, SMARCA4, SMARCB1, SMARCE1, STK11, SUFU, TERC, TERT, TMEM127, TP53, TSC1, TSC2, VHL, WRN and WT1. The test report will be scanned into EPIC and is located under the Molecular Pathology section of the Results Review tab.  A portion of the result report is included below for reference.     We  discussed with Ms. Flamm that because current genetic testing is not perfect, it is possible there may be a gene mutation in one of these genes that current testing cannot detect, but that chance is small.  We also discussed, that there could be another gene that has not yet been discovered, or that we have not yet tested, that is responsible for the cancer diagnoses in the family. It is also possible there is a hereditary cause for the cancer in the family that Ms. Krenzer did not inherit and therefore was not identified in her testing.  Therefore, it is important to remain in touch with cancer genetics in the future so that we can continue to offer Ms. Texeira the most up to date genetic testing.    ADDITIONAL GENETIC TESTING: We discussed with Ms. Mcanelly that her genetic testing was fairly extensive.  If there are genes identified to increase cancer risk that can be analyzed in the future, we would be happy to discuss and coordinate this testing at that time.    CANCER SCREENING RECOMMENDATIONS: Ms. Curd test result is considered negative (normal).  This means that we have not identified a hereditary cause for her personal and family history of cancer at this time. Most cancers happen by chance and this negative test suggests that her cancer may fall into this category.    While reassuring, this does not definitively rule out a hereditary predisposition to cancer. It is still possible that there could be genetic mutations that are undetectable by current technology. There could be genetic mutations in genes that have not been tested or identified to increase cancer risk.  Therefore, it is recommended she continue to follow the cancer management and screening guidelines provided by her oncology and primary healthcare provider.   An individual's cancer risk and medical management are not determined by genetic test results alone. Overall cancer risk assessment incorporates additional factors,  including personal medical history, family history, and any available genetic information that may result in a personalized plan for cancer prevention and surveillance  RECOMMENDATIONS FOR FAMILY MEMBERS:  Individuals in this family might be at some increased risk of developing cancer, over the general population risk, simply due to the family history of cancer.  We recommended women in this family have a yearly mammogram beginning at age 13, or 44 years younger than the earliest onset of cancer, an annual clinical breast exam, and perform monthly breast self-exams. Women in this family should also have a gynecological exam as recommended by their primary provider. All family members should have a colonoscopy by age 43.  It is also possible there is a hereditary cause for the cancer in Ms. Wogan family that she did not inherit and therefore was not identified in her.  Based on Ms. Mccrumb family history, we recommended her siblings have genetic counseling  and testing. Ms. Rosner will let us know if we can be of any assistance in coordinating genetic counseling and/or testing for this family member.   FOLLOW-UP: Lastly, we discussed with Ms. Husser that cancer genetics is a rapidly advancing field and it is possible that new genetic tests will be appropriate for her and/or her family members in the future. We encouraged her to remain in contact with cancer genetics on an annual basis so we can update her personal and family histories and let her know of advances in cancer genetics that may benefit this family.   Our contact number was provided. Ms. Bueno questions were answered to her satisfaction, and she knows she is welcome to call us at anytime with additional questions or concerns.   Clint Guy, MS Genetic Counselor [email or web address] Phone: [phone]

## 2019-07-20 NOTE — Telephone Encounter (Signed)
Revealed negative genetic testing.  Discussed that we do not know why she has breast cancer or why there is cancer in the family. It could be due to a different gene that we are not testing, or maybe our current technology may not be able to pick something up.  It will be important for her to keep in contact with genetics to keep up with whether additional testing may be needed. 

## 2019-08-10 ENCOUNTER — Encounter: Payer: Self-pay | Admitting: *Deleted

## 2019-08-12 ENCOUNTER — Other Ambulatory Visit: Payer: Self-pay | Admitting: Cardiology

## 2019-08-20 ENCOUNTER — Telehealth: Payer: Self-pay

## 2019-08-20 NOTE — Telephone Encounter (Signed)

## 2019-08-21 ENCOUNTER — Ambulatory Visit (INDEPENDENT_AMBULATORY_CARE_PROVIDER_SITE_OTHER): Payer: Commercial Managed Care - PPO | Admitting: Surgical

## 2019-08-21 ENCOUNTER — Encounter: Payer: Self-pay | Admitting: Surgical

## 2019-08-21 ENCOUNTER — Other Ambulatory Visit: Payer: Self-pay

## 2019-08-21 VITALS — BP 128/77 | HR 83 | Temp 97.5°F | Ht 64.0 in | Wt 178.6 lb

## 2019-08-21 DIAGNOSIS — C50411 Malignant neoplasm of upper-outer quadrant of right female breast: Secondary | ICD-10-CM

## 2019-08-21 DIAGNOSIS — Z170 Estrogen receptor positive status [ER+]: Secondary | ICD-10-CM

## 2019-08-21 MED ORDER — ONDANSETRON HCL 4 MG PO TABS: 4.0000 mg | ORAL_TABLET | Freq: Three times a day (TID) | ORAL | 0 refills | Status: DC | PRN

## 2019-08-21 MED ORDER — CEPHALEXIN 500 MG PO CAPS: 500.0000 mg | ORAL_CAPSULE | Freq: Two times a day (BID) | ORAL | 0 refills | Status: AC

## 2019-08-21 MED ORDER — DIAZEPAM 2 MG PO TABS: 2.0000 mg | ORAL_TABLET | Freq: Two times a day (BID) | ORAL | 0 refills | Status: DC | PRN

## 2019-08-21 MED ORDER — HYDROCODONE-ACETAMINOPHEN 5-325 MG PO TABS: 1.0000 | ORAL_TABLET | Freq: Four times a day (QID) | ORAL | 0 refills | Status: AC | PRN

## 2019-08-21 NOTE — H&P (View-Only) (Signed)
Patient ID: Rachel Sutton, female    DOB: 1961-04-04, 58 y.o.   MRN: [id number]  Chief Complaint  Patient presents with  . Pre-op Exam    for (R) breast reconstruction with expander and lat flap      ICD-10-CM   1. Malignant neoplasm of upper-outer quadrant of right breast in female, estrogen receptor positive ([place])  C50.411    Z17.0     History of Present Illness: Rachel Sutton is a 58 y.o.  female  with a history of right sided recurrent breast cancer. Previously diagnosed in 2003 and underwent lumpectomy, chemotherapy and radiation on the right. She noticed a mass in July of this year.  She presents for preoperative evaluation for upcoming procedure, Right mastectomy with Dr. Marlou Starks followed by immediate right breast reconstruction with placement of tissue expander and flex HD with possible right latissimus flap to breast depending on patient's tobacco use/nicotine level, scheduled for 09/07/2019 with Dr. Marla Roe and Dr. Marlou Starks.  Her preop bra size is a 36C per pt.  Mrs. Bozard reports that she has been healthy recently and has no recent illnesses.  She stopped taking her DMARDs for rheumatoid arthritis just after her breast cancer diagnosis in July.  She is currently being weaned off of Xanax and is at a current dose of 0.5 mg.  She has no history of DVT/PE.  She has a history significant for mitral valve prolapse, rheumatoid arthritis, history of tobacco use, hypertension, hyperlipidemia.  She is currently taking anastrozole. She does not take any blood thinners and does not currently have any symptoms from her MVP.  She has not smoked since speaking to Dr. Marla Roe on the phone in July about the risks of the flap not surviving due to smoking.   The patient has not had problems with anesthesia. No recent illnesses.   Past Medical History: Allergies: Allergies  Allergen Reactions  . Bee Venom Anaphylaxis  . Haloperidol Lactate Other (See Comments)    Convulsions   .  Meperidine Hcl Hives  . Xifaxan [Rifaximin] Other (See Comments)    Multiple complaints     Current Medications:  Current Outpatient Medications:  .  ALPRAZolam (XANAX) 0.5 MG tablet, Take 1 tablet (0.5 mg total) by mouth daily., Disp: 90 tablet, Rfl: 2 .  anastrozole (ARIMIDEX) 1 MG tablet, Take 1 tablet (1 mg total) by mouth daily., Disp: 30 tablet, Rfl: 2 .  Calcium-Magnesium-Vitamin D (CALCIUM 500 PO), Take 1 tablet by mouth 2 (two) times daily., Disp: , Rfl:  .  diclofenac sodium (VOLTAREN) 1 % GEL, Apply 1 application topically 3 (three) times daily as needed., Disp: , Rfl:  .  EPINEPHrine (EPI-PEN) 0.3 mg/0.3 mL DEVI, Inject 0.3 mg into the muscle as needed. For bee venom, Disp: , Rfl:  .  fish oil-omega-3 fatty acids 1000 MG capsule, Take 2 g by mouth every morning., Disp: , Rfl:  .  FOLIC ACID PO, Take 1 mg by mouth 2 (two) times daily. , Disp: , Rfl:  .  hydrochlorothiazide (MICROZIDE) 12.5 MG capsule, TAKE 1 CAPSULE DAILY, Disp: 90 capsule, Rfl: 3 .  methotrexate (RHEUMATREX) 2.5 MG tablet, Take 10 mg by mouth once a week., Disp: , Rfl:  .  metoprolol succinate (TOPROL-XL) 50 MG 24 hr tablet, TAKE 1 TABLET TWICE A DAY  WITH OR IMMEDIATELY        FOLLOWING A MEAL., Disp: 180 tablet, Rfl: 3 .  Multiple Vitamin (MULITIVITAMIN WITH MINERALS) TABS, Take 1  tablet by mouth every morning., Disp: , Rfl:  .  NIFEdipine (PROCARDIA XL/NIFEDICAL-XL) 90 MG 24 hr tablet, TAKE 1 TABLET DAILY, Disp: 90 tablet, Rfl: 3 .  nitroGLYCERIN (NITROSTAT) 0.4 MG SL tablet, Place 1 tablet (0.4 mg total) under the tongue every 5 (five) minutes x 3 doses as needed for chest pain., Disp: 25 tablet, Rfl: 3 .  pantoprazole (PROTONIX) 40 MG tablet, TAKE 1 TABLET DAILY, Disp: 30 tablet, Rfl: 6 .  Probiotic Product (PHILLIPS COLON HEALTH PO), Take 1 capsule by mouth daily., Disp: , Rfl:  .  sodium chloride (MURO 128) 5 % ophthalmic solution, Place 1 drop into both eyes every 3 (three) hours as needed. , Disp: , Rfl:    Past Medical Problems: Past Medical History:  Diagnosis Date  . Anxiety   . Arthritis   . Breast cancer ([place]) 02/22/2012   Right T1c, N0  . C. difficile colitis   . Colitis, collagenous 09/22/2015  . Depression   . Essential hypertension, benign   . Family history of brain cancer   . Family history of esophageal cancer   . Family history of lung cancer   . Family history of lymphoma   . Family history of pancreatic cancer   . Fuchs' corneal dystrophy   . GERD (gastroesophageal reflux disease)   . Hypertension   . Mitral valve prolapse    Mild mitral regurgitation  . Mixed hyperlipidemia   . Seizures ([place])    Only with dose of haldol; no more since that was stopped.    Past Surgical History: Past Surgical History:  Procedure Laterality Date  . ABDOMINAL HYSTERECTOMY  1999  . BIOPSY N/A 09/28/2015   Procedure: BIOPSY;  Surgeon: Danie Binder, MD;  Location: [facility];  Service: Endoscopy;  Laterality: N/A;  . BIOPSY  04/05/2016   Procedure: BIOPSY;  Surgeon: Danie Binder, MD;  Location: [facility];  Service: Endoscopy;;  colon bx  . BREAST LUMPECTOMY  10/30/2002   right  . Carpal tunnel R/L  1992  . Cervical spine fusion X2 since last visit    . COLONOSCOPY WITH PROPOFOL N/A 09/28/2015   Procedure: COLONOSCOPY WITH PROPOFOL;  Surgeon: Danie Binder, MD;  Location: [facility];  Service: Endoscopy;  Laterality: N/A;  procedure 1 cecum time in 1232  time out 1246   total time 14 mintes  . COLONOSCOPY WITH PROPOFOL N/A 04/05/2016   Procedure: COLONOSCOPY WITH PROPOFOL;  Surgeon: Danie Binder, MD;  Location: [facility];  Service: Endoscopy;  Laterality: N/A;  1200  . 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 ELBOW SURGERY Right  tennis elbow release 2012  . ESOPHAGOGASTRODUODENOSCOPY  10/26/2012   Procedure: ESOPHAGOGASTRODUODENOSCOPY (EGD);  Surgeon: Inda Castle, MD;  Location: [facility];  Service: Endoscopy;  Laterality: N/A;  . ESOPHAGOGASTRODUODENOSCOPY (EGD)  WITH PROPOFOL N/A 09/28/2015   Procedure: ESOPHAGOGASTRODUODENOSCOPY (EGD) WITH PROPOFOL;  Surgeon: Danie Binder, MD;  Location: [facility];  Service: Endoscopy;  Laterality: N/A;  . KNEE ARTHROSCOPY Left   . LEFT HEART CATHETERIZATION WITH CORONARY ANGIOGRAM N/A 10/23/2012   Procedure: LEFT HEART CATHETERIZATION WITH CORONARY ANGIOGRAM;  Surgeon: Peter M Martinique, MD;  Location: [facility] CATH LAB;  Service: Cardiovascular;  Laterality: N/A;  . OVARIAN CYST REMOVAL     prior to hysterectomy, right  . Ray cage fusion  1997  . SENTINEL LYMPH NODE BIOPSY  10/30/2002   right    Social History: Social History   Socioeconomic  History  . Marital status: Married    Spouse name: Not on file  . Number of children: 2  . Years of education: Not on file  . Highest education level: Not on file  Occupational History  . Occupation: Aeronautical engineer: [employer]    Comment: [place]  . Financial resource strain: Not on file  . Food insecurity    Worry: Not on file    Inability: Not on file  . Transportation needs    Medical: Not on file    Non-medical: Not on file  Tobacco Use  . Smoking status: Former Smoker    Packs/day: 0.50    Years: 35.00    Pack years: 17.50    Types: Cigarettes    Start date: 07/07/1974    Quit date: 07/14/2019    Years since quitting: 0.1  . Smokeless tobacco: Never Used  Substance and Sexual Activity  . Alcohol use: Yes    Alcohol/week: 0.0 standard drinks    Comment: Rare( stopped July 26, 2014)  . Drug use: No  . Sexual activity: Not Currently    Birth control/protection: Surgical  Lifestyle  . Physical activity    Days per week: Not on file    Minutes per session: Not on file  . Stress: Not on file  Relationships  . Social Herbalist on phone: Not on file    Gets together: Not on file    Attends religious service: Not on file    Active member of club or organization: Not on file    Attends meetings of clubs or organizations: Not on  file    Relationship status: Not on file  . Intimate partner violence    Fear of current or ex partner: Not on file    Emotionally abused: Not on file    Physically abused: Not on file    Forced sexual activity: Not on file  Other Topics Concern  . Not on file  Social History Narrative  . Not on file    Family History: Family History  Problem Relation Age of Onset  . Diabetes Mother   . Pancreatic cancer Mother 79  . Lung cancer Father   . Diabetes Father   . Heart disease Father   . Esophageal cancer Father 73       Beer drinker  . Brain cancer Brother 21  . Brain cancer Maternal Grandfather   . Heart disease Paternal Grandfather   . Cancer Maternal Uncle        unknown form of cancer; cigar smoker  . Lung cancer Maternal Grandmother   . Brain cancer Cousin        dx in his late 68s-40s  . Schizophrenia Sister   . Lymphoma Niece 28  . Anesthesia problems Neg Hx   . Hypotension Neg Hx   . Malignant hyperthermia Neg Hx   . Pseudochol deficiency Neg Hx   . Colon cancer Neg Hx   . Colon polyps Neg Hx   . Crohn's disease Neg Hx   . Ulcerative colitis Neg Hx     Review of Systems: Review of Systems  Constitutional: Negative for chills, diaphoresis, fever, malaise/fatigue and weight loss.  Respiratory: Negative.   Cardiovascular: Negative.   Genitourinary: Negative.   Musculoskeletal: Negative.   Skin: Negative for itching and rash.  Neurological: Negative.   Psychiatric/Behavioral: Negative.     Physical Exam: Vital Signs BP 128/77 (BP Location: Left Arm, Patient  Position: Sitting, Cuff Size: Normal)   Pulse 83   Temp (!) 97.5 F (36.4 C) (Temporal)   Ht 5\' 4"  (1.626 m)   Wt 178 lb 9.6 oz (81 kg)   SpO2 96%   BMI 30.66 kg/m   Physical Exam Exam conducted with a chaperone present.  Constitutional:      General: She is not in acute distress.    Appearance: Normal appearance. She is not ill-appearing.  HENT:     Head: Normocephalic and atraumatic.   Eyes:     Pupils: Pupils are equal, round Neck:     Musculoskeletal: Normal range of motion.   Cardiovascular:     Rate and Rhythm: Normal rate and regular rhythm.     Pulses: Normal pulses.     Heart sounds: Murmur heard, faint.   No edema present. Pulmonary:     Effort: Pulmonary effort is normal. No respiratory distress.     Breath sounds: Normal breath sounds. No wheezing.  Abdominal:     General: Abdomen is flat. There is no distension.     Palpations: Abdomen is soft.     Tenderness: There is no abdominal tenderness.  Musculoskeletal: Normal range of motion.  Skin:    General: Skin is warm and dry.     Findings: No erythema or rash.  Neurological:     General: No focal deficit present.     Mental Status: She is alert and oriented to person, place, and time. Mental status is at baseline.     Motor: No weakness.  Psychiatric:        Mood and Affect: Mood normal.        Behavior: Behavior normal.    Assessment/Plan: Mrs. Ziebell is scheduled for right-sided mastectomy followed by immediate breast reconstruction on the right with placement of tissue expander and Flex HD with possible right latissimus flap breast depending on patient's tobacco use/nicotine level prior to surgical intervention With Dr. Marla Roe.  Risks, benefits, and alternatives of procedure discussed, questions answered and consent obtained.    The risks that can be encountered with and after placement of a breast expander placement were discussed and include the following but not limited to these: bleeding, infection, delayed healing, anesthesia risks, skin sensation changes, injury to structures including nerves, blood vessels, and muscles which may be temporary or permanent, allergies to tape, suture materials and glues, blood products, topical preparations or injected agents, skin contour irregularities, skin discoloration and swelling, deep vein thrombosis, cardiac and pulmonary complications, pain, which  may persist, fluid accumulation, wrinkling of the skin over the expander, changes in nipple or breast sensation, expander leakage or rupture, faulty position of the expander, persistent pain, formation of tight scar tissue around the expander (capsular contracture), possible need for revisional surgery or staged procedures.  Plan for patient to stay at least 2 days in hospital postoperatively. Prescription sent to pharmacy for home medication.  Mrs. Benenhaley understands that the use of nicotine will automatically negate her from receiving the right latissimus flap.  Plan for preoperative nicotine level.  The risk of the flap failing if nicotine is involved is high.  Patient has been compliant and has been doing great with avoiding cigarettes/nicotine.  Mrs. Ottaviani is generally healthy other than her MVP/essential hypertension.  She has not had any symptoms with her MVP in regards to chest pain she reports that her essential hypertension is under control.  She has a Cab. pRini score of 7 which indicates a approximate risk of  6% of DVT/PE if no prophylaxis is provided.  She will have mechanical prophylaxis during her operation and postoperatively in the hospital.  Will consult with Dr. Marla Roe to determine further prophylaxis is necessary.   Electronically signed by: Carola Rhine Maurie Musco, PA-C 08/21/2019 10:48 AM

## 2019-08-21 NOTE — Progress Notes (Signed)
Patient ID: Rachel Sutton, female    DOB: 09/03/1961, 58 y.o.   MRN: [id number]  Chief Complaint  Patient presents with  . Pre-op Exam    for (R) breast reconstruction with expander and lat flap      ICD-10-CM   1. Malignant neoplasm of upper-outer quadrant of right breast in female, estrogen receptor positive ([place])  C50.411    Z17.0     History of Present Illness: Rachel Sutton is a 58 y.o.  female  with a history of right sided recurrent breast cancer. Previously diagnosed in 2003 and underwent lumpectomy, chemotherapy and radiation on the right. She noticed a mass in July of this year.  She presents for preoperative evaluation for upcoming procedure, Right mastectomy with Dr. Marlou Starks followed by immediate right breast reconstruction with placement of tissue expander and flex HD with possible right latissimus flap to breast depending on patient's tobacco use/nicotine level, scheduled for 09/07/2019 with Dr. Marla Roe and Dr. Marlou Starks.  Her preop bra size is a 36C per pt.  Rachel Sutton reports that she has been healthy recently and has no recent illnesses.  She stopped taking her DMARDs for rheumatoid arthritis just after her breast cancer diagnosis in July.  She is currently being weaned off of Xanax and is at a current dose of 0.5 mg.  She has no history of DVT/PE.  She has a history significant for mitral valve prolapse, rheumatoid arthritis, history of tobacco use, hypertension, hyperlipidemia.  She is currently taking anastrozole. She does not take any blood thinners and does not currently have any symptoms from her MVP.  She has not smoked since speaking to Dr. Marla Roe on the phone in July about the risks of the flap not surviving due to smoking.   The patient has not had problems with anesthesia. No recent illnesses.   Past Medical History: Allergies: Allergies  Allergen Reactions  . Bee Venom Anaphylaxis  . Haloperidol Lactate Other (See Comments)    Convulsions   .  Meperidine Hcl Hives  . Xifaxan [Rifaximin] Other (See Comments)    Multiple complaints     Current Medications:  Current Outpatient Medications:  .  ALPRAZolam (XANAX) 0.5 MG tablet, Take 1 tablet (0.5 mg total) by mouth daily., Disp: 90 tablet, Rfl: 2 .  anastrozole (ARIMIDEX) 1 MG tablet, Take 1 tablet (1 mg total) by mouth daily., Disp: 30 tablet, Rfl: 2 .  Calcium-Magnesium-Vitamin D (CALCIUM 500 PO), Take 1 tablet by mouth 2 (two) times daily., Disp: , Rfl:  .  diclofenac sodium (VOLTAREN) 1 % GEL, Apply 1 application topically 3 (three) times daily as needed., Disp: , Rfl:  .  EPINEPHrine (EPI-PEN) 0.3 mg/0.3 mL DEVI, Inject 0.3 mg into the muscle as needed. For bee venom, Disp: , Rfl:  .  fish oil-omega-3 fatty acids 1000 MG capsule, Take 2 g by mouth every morning., Disp: , Rfl:  .  FOLIC ACID PO, Take 1 mg by mouth 2 (two) times daily. , Disp: , Rfl:  .  hydrochlorothiazide (MICROZIDE) 12.5 MG capsule, TAKE 1 CAPSULE DAILY, Disp: 90 capsule, Rfl: 3 .  methotrexate (RHEUMATREX) 2.5 MG tablet, Take 10 mg by mouth once a week., Disp: , Rfl:  .  metoprolol succinate (TOPROL-XL) 50 MG 24 hr tablet, TAKE 1 TABLET TWICE A DAY  WITH OR IMMEDIATELY        FOLLOWING A MEAL., Disp: 180 tablet, Rfl: 3 .  Multiple Vitamin (MULITIVITAMIN WITH MINERALS) TABS, Take 1  tablet by mouth every morning., Disp: , Rfl:  .  NIFEdipine (PROCARDIA XL/NIFEDICAL-XL) 90 MG 24 hr tablet, TAKE 1 TABLET DAILY, Disp: 90 tablet, Rfl: 3 .  nitroGLYCERIN (NITROSTAT) 0.4 MG SL tablet, Place 1 tablet (0.4 mg total) under the tongue every 5 (five) minutes x 3 doses as needed for chest pain., Disp: 25 tablet, Rfl: 3 .  pantoprazole (PROTONIX) 40 MG tablet, TAKE 1 TABLET DAILY, Disp: 30 tablet, Rfl: 6 .  Probiotic Product (PHILLIPS COLON HEALTH PO), Take 1 capsule by mouth daily., Disp: , Rfl:  .  sodium chloride (MURO 128) 5 % ophthalmic solution, Place 1 drop into both eyes every 3 (three) hours as needed. , Disp: , Rfl:    Past Medical Problems: Past Medical History:  Diagnosis Date  . Anxiety   . Arthritis   . Breast cancer ([place]) 02/22/2012   Right T1c, N0  . C. difficile colitis   . Colitis, collagenous 09/22/2015  . Depression   . Essential hypertension, benign   . Family history of brain cancer   . Family history of esophageal cancer   . Family history of lung cancer   . Family history of lymphoma   . Family history of pancreatic cancer   . Fuchs' corneal dystrophy   . GERD (gastroesophageal reflux disease)   . Hypertension   . Mitral valve prolapse    Mild mitral regurgitation  . Mixed hyperlipidemia   . Seizures ([place])    Only with dose of haldol; no more since that was stopped.    Past Surgical History: Past Surgical History:  Procedure Laterality Date  . ABDOMINAL HYSTERECTOMY  1999  . BIOPSY N/A 09/28/2015   Procedure: BIOPSY;  Surgeon: Danie Binder, MD;  Location: [facility];  Service: Endoscopy;  Laterality: N/A;  . BIOPSY  04/05/2016   Procedure: BIOPSY;  Surgeon: Danie Binder, MD;  Location: [facility];  Service: Endoscopy;;  colon bx  . BREAST LUMPECTOMY  10/30/2002   right  . Carpal tunnel R/L  1992  . Cervical spine fusion X2 since last visit    . COLONOSCOPY WITH PROPOFOL N/A 09/28/2015   Procedure: COLONOSCOPY WITH PROPOFOL;  Surgeon: Danie Binder, MD;  Location: [facility];  Service: Endoscopy;  Laterality: N/A;  procedure 1 cecum time in 1232  time out 1246   total time 14 mintes  . COLONOSCOPY WITH PROPOFOL N/A 04/05/2016   Procedure: COLONOSCOPY WITH PROPOFOL;  Surgeon: Danie Binder, MD;  Location: [facility];  Service: Endoscopy;  Laterality: N/A;  1200  . 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 ELBOW SURGERY Right  tennis elbow release 2012  . ESOPHAGOGASTRODUODENOSCOPY  10/26/2012   Procedure: ESOPHAGOGASTRODUODENOSCOPY (EGD);  Surgeon: Inda Castle, MD;  Location: [facility];  Service: Endoscopy;  Laterality: N/A;  . ESOPHAGOGASTRODUODENOSCOPY (EGD)  WITH PROPOFOL N/A 09/28/2015   Procedure: ESOPHAGOGASTRODUODENOSCOPY (EGD) WITH PROPOFOL;  Surgeon: Danie Binder, MD;  Location: [facility];  Service: Endoscopy;  Laterality: N/A;  . KNEE ARTHROSCOPY Left   . LEFT HEART CATHETERIZATION WITH CORONARY ANGIOGRAM N/A 10/23/2012   Procedure: LEFT HEART CATHETERIZATION WITH CORONARY ANGIOGRAM;  Surgeon: Peter M Martinique, MD;  Location: [facility] CATH LAB;  Service: Cardiovascular;  Laterality: N/A;  . OVARIAN CYST REMOVAL     prior to hysterectomy, right  . Ray cage fusion  1997  . SENTINEL LYMPH NODE BIOPSY  10/30/2002   right    Social History: Social History   Socioeconomic  History  . Marital status: Married    Spouse name: Not on file  . Number of children: 2  . Years of education: Not on file  . Highest education level: Not on file  Occupational History  . Occupation: Aeronautical engineer: [employer]    Comment: [employer]  . Financial resource strain: Not on file  . Food insecurity    Worry: Not on file    Inability: Not on file  . Transportation needs    Medical: Not on file    Non-medical: Not on file  Tobacco Use  . Smoking status: Former Smoker    Packs/day: 0.50    Years: 35.00    Pack years: 17.50    Types: Cigarettes    Start date: 07/07/1974    Quit date: 07/14/2019    Years since quitting: 0.1  . Smokeless tobacco: Never Used  Substance and Sexual Activity  . Alcohol use: Yes    Alcohol/week: 0.0 standard drinks    Comment: Rare( stopped July 26, 2014)  . Drug use: No  . Sexual activity: Not Currently    Birth control/protection: Surgical  Lifestyle  . Physical activity    Days per week: Not on file    Minutes per session: Not on file  . Stress: Not on file  Relationships  . Social Herbalist on phone: Not on file    Gets together: Not on file    Attends religious service: Not on file    Active member of club or organization: Not on file    Attends meetings of clubs or organizations: Not on  file    Relationship status: Not on file  . Intimate partner violence    Fear of current or ex partner: Not on file    Emotionally abused: Not on file    Physically abused: Not on file    Forced sexual activity: Not on file  Other Topics Concern  . Not on file  Social History Narrative  . Not on file    Family History: Family History  Problem Relation Age of Onset  . Diabetes Mother   . Pancreatic cancer Mother 82  . Lung cancer Father   . Diabetes Father   . Heart disease Father   . Esophageal cancer Father 39       Beer drinker  . Brain cancer Brother 39  . Brain cancer Maternal Grandfather   . Heart disease Paternal Grandfather   . Cancer Maternal Uncle        unknown form of cancer; cigar smoker  . Lung cancer Maternal Grandmother   . Brain cancer Cousin        dx in his late 45s-40s  . Schizophrenia Sister   . Lymphoma Niece 8  . Anesthesia problems Neg Hx   . Hypotension Neg Hx   . Malignant hyperthermia Neg Hx   . Pseudochol deficiency Neg Hx   . Colon cancer Neg Hx   . Colon polyps Neg Hx   . Crohn's disease Neg Hx   . Ulcerative colitis Neg Hx     Review of Systems: Review of Systems  Constitutional: Negative for chills, diaphoresis, fever, malaise/fatigue and weight loss.  Respiratory: Negative.   Cardiovascular: Negative.   Genitourinary: Negative.   Musculoskeletal: Negative.   Skin: Negative for itching and rash.  Neurological: Negative.   Psychiatric/Behavioral: Negative.     Physical Exam: Vital Signs BP 128/77 (BP Location: Left Arm, Patient  Position: Sitting, Cuff Size: Normal)   Pulse 83   Temp (!) 97.5 F (36.4 C) (Temporal)   Ht 5\' 4"  (1.626 m)   Wt 178 lb 9.6 oz (81 kg)   SpO2 96%   BMI 30.66 kg/m   Physical Exam Exam conducted with a chaperone present.  Constitutional:      General: She is not in acute distress.    Appearance: Normal appearance. She is not ill-appearing.  HENT:     Head: Normocephalic and atraumatic.   Eyes:     Pupils: Pupils are equal, round Neck:     Musculoskeletal: Normal range of motion.   Cardiovascular:     Rate and Rhythm: Normal rate and regular rhythm.     Pulses: Normal pulses.     Heart sounds: Murmur heard, faint.   No edema present. Pulmonary:     Effort: Pulmonary effort is normal. No respiratory distress.     Breath sounds: Normal breath sounds. No wheezing.  Abdominal:     General: Abdomen is flat. There is no distension.     Palpations: Abdomen is soft.     Tenderness: There is no abdominal tenderness.  Musculoskeletal: Normal range of motion.  Skin:    General: Skin is warm and dry.     Findings: No erythema or rash.  Neurological:     General: No focal deficit present.     Mental Status: She is alert and oriented to person, place, and time. Mental status is at baseline.     Motor: No weakness.  Psychiatric:        Mood and Affect: Mood normal.        Behavior: Behavior normal.    Assessment/Plan: Mrs. Ramil is scheduled for right-sided mastectomy followed by immediate breast reconstruction on the right with placement of tissue expander and Flex HD with possible right latissimus flap breast depending on patient's tobacco use/nicotine level prior to surgical intervention With Dr. Marla Roe.  Risks, benefits, and alternatives of procedure discussed, questions answered and consent obtained.    The risks that can be encountered with and after placement of a breast expander placement were discussed and include the following but not limited to these: bleeding, infection, delayed healing, anesthesia risks, skin sensation changes, injury to structures including nerves, blood vessels, and muscles which may be temporary or permanent, allergies to tape, suture materials and glues, blood products, topical preparations or injected agents, skin contour irregularities, skin discoloration and swelling, deep vein thrombosis, cardiac and pulmonary complications, pain, which  may persist, fluid accumulation, wrinkling of the skin over the expander, changes in nipple or breast sensation, expander leakage or rupture, faulty position of the expander, persistent pain, formation of tight scar tissue around the expander (capsular contracture), possible need for revisional surgery or staged procedures.  Plan for patient to stay at least 2 days in hospital postoperatively. Prescription sent to pharmacy for home medication.  Mrs. Petroff understands that the use of nicotine will automatically negate her from receiving the right latissimus flap.  Plan for preoperative nicotine level.  The risk of the flap failing if nicotine is involved is high.  Patient has been compliant and has been doing great with avoiding cigarettes/nicotine.  Mrs. Enea is generally healthy other than her MVP/essential hypertension.  She has not had any symptoms with her MVP in regards to chest pain she reports that her essential hypertension is under control.  She has a Cab. pRini score of 7 which indicates a approximate risk of  6% of DVT/PE if no prophylaxis is provided.  She will have mechanical prophylaxis during her operation and postoperatively in the hospital.  Will consult with Dr. Marla Roe to determine further prophylaxis is necessary.   Electronically signed by: Carola Rhine Kayla Weekes, PA-C 08/21/2019 10:48 AM

## 2019-09-02 NOTE — Pre-Procedure Instructions (Signed)
AZHA SCHERMERHORN  09/02/2019      [facility] [id number], [place] [address] Phone: [phone] Fax: [phone]    Your procedure is scheduled on September 07, 2019.  Report to [facility] Admitting at 630 AM.  Call this number if you have problems the morning of surgery:  [phone]   Remember:  Do not eat after midnight.  You may drink clear liquids until 630 AM.  Clear liquids allowed are:                    Water, Juice (non-citric and without pulp), Clear Tea, Black Coffee only and Gatorade    Take these medicines the morning of surgery with A SIP OF WATER  Tylenol-if needed Metoprolol succinate (Toprol XL) Nifepidine (Procardia XL) Alprazolam (xanax) Nitrostat-if needed for chest pain Epi-pen-if needed for bee sting  7 days prior to surgery STOP taking any Diclofenac (Voltaren) gel, Aspirin (unless otherwise instructed by your surgeon), Aleve, Naproxen, Ibuprofen, Motrin, Advil, Goody's, BC's, all herbal medications, fish oil, and all vitamins   Day of surgery:  Do not wear jewelry, make-up or nail polish.  Do not wear lotions, powders, or perfumes, or deodorant.  Do not shave 48 hours prior to surgery.    Do not bring valuables to the hospital.  [facility] is not responsible for any belongings or valuables.  Contacts, dentures or bridgework may not be worn into surgery.  Leave your suitcase in the car.  After surgery it may be brought to your room.  For patients admitted to the hospital, discharge time will be determined by your treatment team.  Patients discharged the day of surgery will not be allowed to drive home.    [place]- Preparing For Surgery  Before surgery, you can play an important role. Because skin is not sterile, your skin needs to be as free of germs as possible. You can reduce the number of germs on your  skin by washing with CHG (chlorahexidine gluconate) Soap before surgery.  CHG is an antiseptic cleaner which kills germs and bonds with the skin to continue killing germs even after washing.    Oral Hygiene is also important to reduce your risk of infection.  Remember - BRUSH YOUR TEETH THE MORNING OF SURGERY WITH YOUR REGULAR TOOTHPASTE  Please do not use if you have an allergy to CHG or antibacterial soaps. If your skin becomes reddened/irritated stop using the CHG.  Do not shave (including legs and underarms) for at least 48 hours prior to first CHG shower. It is OK to shave your face.  Please follow these instructions carefully.   1. Shower the NIGHT BEFORE SURGERY and the MORNING OF SURGERY with CHG.   2. If you chose to wash your hair, wash your hair first as usual with your normal shampoo.  3. After you shampoo, rinse your hair and body thoroughly to remove the shampoo.  4. Use CHG as you would any other liquid soap. You can apply CHG directly to the skin and wash gently with a scrungie or a clean washcloth.   5. Apply the CHG Soap to your body ONLY FROM THE NECK DOWN.  Do not use on open wounds or open sores. Avoid contact with your eyes, ears, mouth and genitals (private parts). Wash Face and genitals (private parts)  with your normal soap.  6. Wash thoroughly, paying special attention to the area where your surgery will be performed.  7. Thoroughly rinse your body with warm water from the neck down.  8. DO NOT shower/wash with your normal soap after using and rinsing off the CHG Soap.  9. Pat yourself dry with a CLEAN TOWEL.  10. Wear CLEAN PAJAMAS to bed the night before surgery, wear comfortable clothes the morning of surgery  11. Place CLEAN SHEETS on your bed the night of your first shower and DO NOT SLEEP WITH PETS.  Day of Surgery: Shower as above Do not apply any deodorants/lotions.  Please wear clean clothes to the hospital/surgery center.   Remember to brush your  teeth WITH YOUR REGULAR TOOTHPASTE.  Please read over the following fact sheets that you were given.

## 2019-09-03 ENCOUNTER — Encounter (HOSPITAL_COMMUNITY): Payer: Self-pay

## 2019-09-03 ENCOUNTER — Other Ambulatory Visit (HOSPITAL_COMMUNITY)
Admission: RE | Admit: 2019-09-03 | Discharge: 2019-09-03 | Disposition: A | Discharge status: Home / Self Care | Payer: Commercial Managed Care - PPO | Source: Ambulatory Visit | Attending: General Surgery | Admitting: General Surgery

## 2019-09-03 ENCOUNTER — Encounter (HOSPITAL_COMMUNITY)
Admission: RE | Admit: 2019-09-03 | Discharge: 2019-09-03 | Disposition: A | Discharge status: Home / Self Care | Payer: Commercial Managed Care - PPO | Source: Ambulatory Visit | Attending: General Surgery | Admitting: General Surgery

## 2019-09-03 ENCOUNTER — Other Ambulatory Visit: Payer: Self-pay

## 2019-09-03 DIAGNOSIS — Z01818 Encounter for other preprocedural examination: Secondary | ICD-10-CM | POA: Diagnosis present

## 2019-09-03 DIAGNOSIS — Z20828 Contact with and (suspected) exposure to other viral communicable diseases: Secondary | ICD-10-CM | POA: Diagnosis not present

## 2019-09-03 DIAGNOSIS — I10 Essential (primary) hypertension: Secondary | ICD-10-CM | POA: Diagnosis not present

## 2019-09-03 LAB — CBC
HCT: 42 % (ref 36.0–46.0)
Hemoglobin: 14.6 g/dL (ref 12.0–15.0)
MCH: 31.9 pg (ref 26.0–34.0)
MCHC: 34.8 g/dL (ref 30.0–36.0)
MCV: 91.9 fL (ref 80.0–100.0)
Platelets: 375 10*3/uL (ref 150–400)
RBC: 4.57 MIL/uL (ref 3.87–5.11)
RDW: 12.6 % (ref 11.5–15.5)
WBC: 10.1 10*3/uL (ref 4.0–10.5)
nRBC: 0 % (ref 0.0–0.2)

## 2019-09-03 LAB — BASIC METABOLIC PANEL
Anion gap: 12 (ref 5–15)
BUN: 10 mg/dL (ref 6–20)
CO2: 23 mmol/L (ref 22–32)
Calcium: 9.5 mg/dL (ref 8.9–10.3)
Chloride: 103 mmol/L (ref 98–111)
Creatinine, Ser: 0.78 mg/dL (ref 0.44–1.00)
GFR calc Af Amer: >60 mL/min (ref >60)
GFR calc non Af Amer: >60 mL/min (ref >60)
Glucose, Bld: 166 mg/dL — ABNORMAL HIGH (ref 70–99)
Potassium: 3.8 mmol/L (ref 3.5–5.1)
Sodium: 138 mmol/L (ref 135–145)

## 2019-09-03 NOTE — Progress Notes (Signed)
PCP - Janie Morning, DO Cardiologist - Rozann Lesches,  MD  Chest x-ray - N/A EKG - 09/03/2019 Stress Test - denies ECHO - 10/10/2017 Cardiac Cath - 2013  Sleep Study - denies CPAP - N/A  Blood Thinner Instructions: N/A Aspirin Instructions: N/A  COVID TEST- 09/03/2019; aware of need for self-quarantine post-testing  Coronavirus Screening  Have you experienced the following symptoms:  Cough yes/no: No Fever (>100.83F)  yes/no: No Runny nose yes/no: No Sore throat yes/no: No Difficulty breathing/shortness of breath  yes/no: No  Have you or a family member traveled in the last 14 days and where? yes/no: No   If the patient indicates "YES" to the above questions, their PAT will be rescheduled to limit the exposure to others and, the surgeon will be notified. THE PATIENT WILL NEED TO BE ASYMPTOMATIC FOR 14 DAYS.   If the patient is not experiencing any of these symptoms, the PAT nurse will instruct them to NOT bring anyone with them to their appointment since they may have these symptoms or traveled as well.   Please remind your patients and families that hospital visitation restrictions are in effect and the importance of the restrictions.   Anesthesia review: no  Patient denies shortness of breath, fever, cough and chest pain at PAT appointment   Patient verbalized understanding of instructions that were given to them at the PAT appointment. Patient was also instructed that they will need to review over the PAT instructions again at home before surgery.

## 2019-09-03 NOTE — Addendum Note (Signed)
Addended by: Wallace Going on: 09/03/2019 10:03 AM   Modules accepted: Orders

## 2019-09-04 LAB — NOVEL CORONAVIRUS, NAA (HOSP ORDER, SEND-OUT TO REF LAB; TAT 18-24 HRS): SARS-CoV-2, NAA: NOT DETECTED

## 2019-09-07 ENCOUNTER — Observation Stay (HOSPITAL_COMMUNITY)
Admission: RE | Admit: 2019-09-07 | Discharge: 2019-09-08 | Disposition: A | Discharge status: Home / Self Care | Payer: Commercial Managed Care - PPO | Source: Ambulatory Visit | Attending: Plastic Surgery | Admitting: Plastic Surgery

## 2019-09-07 ENCOUNTER — Other Ambulatory Visit: Payer: Self-pay

## 2019-09-07 ENCOUNTER — Ambulatory Visit (HOSPITAL_COMMUNITY): Payer: Commercial Managed Care - PPO

## 2019-09-07 ENCOUNTER — Inpatient Hospital Stay (HOSPITAL_COMMUNITY): Payer: Commercial Managed Care - PPO | Admitting: Certified Registered"

## 2019-09-07 ENCOUNTER — Encounter (HOSPITAL_COMMUNITY): Payer: Self-pay | Admitting: *Deleted

## 2019-09-07 ENCOUNTER — Encounter (HOSPITAL_COMMUNITY)
Admission: RE | Disposition: A | Discharge status: Home / Self Care | Payer: Self-pay | Source: Ambulatory Visit | Attending: Plastic Surgery

## 2019-09-07 ENCOUNTER — Ambulatory Visit (HOSPITAL_COMMUNITY)
Admission: RE | Admit: 2019-09-07 | Discharge: 2019-09-07 | Disposition: A | Discharge status: Home / Self Care | Payer: Commercial Managed Care - PPO | Source: Ambulatory Visit | Attending: General Surgery | Admitting: General Surgery

## 2019-09-07 DIAGNOSIS — K219 Gastro-esophageal reflux disease without esophagitis: Secondary | ICD-10-CM | POA: Diagnosis not present

## 2019-09-07 DIAGNOSIS — F419 Anxiety disorder, unspecified: Secondary | ICD-10-CM | POA: Diagnosis not present

## 2019-09-07 DIAGNOSIS — Z800 Family history of malignant neoplasm of digestive organs: Secondary | ICD-10-CM | POA: Insufficient documentation

## 2019-09-07 DIAGNOSIS — Z801 Family history of malignant neoplasm of trachea, bronchus and lung: Secondary | ICD-10-CM | POA: Insufficient documentation

## 2019-09-07 DIAGNOSIS — M1990 Unspecified osteoarthritis, unspecified site: Secondary | ICD-10-CM | POA: Insufficient documentation

## 2019-09-07 DIAGNOSIS — I341 Nonrheumatic mitral (valve) prolapse: Secondary | ICD-10-CM | POA: Diagnosis not present

## 2019-09-07 DIAGNOSIS — Z170 Estrogen receptor positive status [ER+]: Secondary | ICD-10-CM | POA: Diagnosis not present

## 2019-09-07 DIAGNOSIS — Z807 Family history of other malignant neoplasms of lymphoid, hematopoietic and related tissues: Secondary | ICD-10-CM | POA: Diagnosis not present

## 2019-09-07 DIAGNOSIS — C50411 Malignant neoplasm of upper-outer quadrant of right female breast: Principal | ICD-10-CM | POA: Insufficient documentation

## 2019-09-07 DIAGNOSIS — Z808 Family history of malignant neoplasm of other organs or systems: Secondary | ICD-10-CM | POA: Insufficient documentation

## 2019-09-07 DIAGNOSIS — I10 Essential (primary) hypertension: Secondary | ICD-10-CM | POA: Insufficient documentation

## 2019-09-07 DIAGNOSIS — C50911 Malignant neoplasm of unspecified site of right female breast: Secondary | ICD-10-CM

## 2019-09-07 DIAGNOSIS — Z79899 Other long term (current) drug therapy: Secondary | ICD-10-CM | POA: Insufficient documentation

## 2019-09-07 DIAGNOSIS — C50919 Malignant neoplasm of unspecified site of unspecified female breast: Secondary | ICD-10-CM | POA: Diagnosis present

## 2019-09-07 DIAGNOSIS — M069 Rheumatoid arthritis, unspecified: Secondary | ICD-10-CM | POA: Diagnosis not present

## 2019-09-07 DIAGNOSIS — E782 Mixed hyperlipidemia: Secondary | ICD-10-CM | POA: Insufficient documentation

## 2019-09-07 HISTORY — PX: BREAST RECONSTRUCTION WITH PLACEMENT OF TISSUE EXPANDER AND FLEX HD (ACELLULAR HYDRATED DERMIS): SHX6295

## 2019-09-07 HISTORY — PX: MASTECTOMY W/ SENTINEL NODE BIOPSY: SHX2001

## 2019-09-07 SURGERY — MASTECTOMY WITH SENTINEL LYMPH NODE BIOPSY
Anesthesia: Regional | Site: Breast | Laterality: Right

## 2019-09-07 MED ORDER — SODIUM CHLORIDE 0.9 % IV SOLN
INTRAVENOUS | Status: AC
  Filled 2019-09-07 (×2): qty 500000

## 2019-09-07 MED ORDER — DIAZEPAM 2 MG PO TABS
2.0000 mg | ORAL_TABLET | Freq: Two times a day (BID) | ORAL | Status: DC | PRN
  Administered 2019-09-07 – 2019-09-08 (×2): 2 mg via ORAL
  Filled 2019-09-07 (×2): qty 1

## 2019-09-07 MED ORDER — CEFAZOLIN SODIUM-DEXTROSE 2-4 GM/100ML-% IV SOLN: 2.0000 g | INTRAVENOUS | Status: DC

## 2019-09-07 MED ORDER — DIPHENHYDRAMINE HCL 50 MG/ML IJ SOLN: 12.5000 mg | Freq: Four times a day (QID) | INTRAMUSCULAR | Status: DC | PRN

## 2019-09-07 MED ORDER — VISTASEAL 10 ML SINGLE DOSE KIT
10.0000 mL | PACK | Freq: Once | CUTANEOUS | Status: DC
  Filled 2019-09-07: qty 10

## 2019-09-07 MED ORDER — PROPOFOL 10 MG/ML IV BOLUS
INTRAVENOUS | Status: AC
  Filled 2019-09-07: qty 20

## 2019-09-07 MED ORDER — METHYLENE BLUE 0.5 % INJ SOLN
INTRAVENOUS | Status: AC
  Filled 2019-09-07: qty 10

## 2019-09-07 MED ORDER — CHLORHEXIDINE GLUCONATE CLOTH 2 % EX PADS: 6.0000 | MEDICATED_PAD | Freq: Once | CUTANEOUS | Status: DC

## 2019-09-07 MED ORDER — ACETAMINOPHEN 500 MG PO TABS
1000.0000 mg | ORAL_TABLET | ORAL | Status: AC
  Administered 2019-09-07: 500 mg via ORAL
  Filled 2019-09-07: qty 2

## 2019-09-07 MED ORDER — 0.9 % SODIUM CHLORIDE (POUR BTL) OPTIME
TOPICAL | Status: DC | PRN
  Administered 2019-09-07: 2000 mL

## 2019-09-07 MED ORDER — HYDROMORPHONE HCL 1 MG/ML IJ SOLN
INTRAMUSCULAR | Status: AC
  Filled 2019-09-07: qty 1

## 2019-09-07 MED ORDER — ONDANSETRON 4 MG PO TBDP: 4.0000 mg | ORAL_TABLET | Freq: Four times a day (QID) | ORAL | Status: DC | PRN

## 2019-09-07 MED ORDER — SODIUM CHLORIDE 0.9 % IV SOLN
INTRAVENOUS | Status: DC | PRN
  Administered 2019-09-07: 500 mL

## 2019-09-07 MED ORDER — NAPROXEN 250 MG PO TABS: 500.0000 mg | ORAL_TABLET | Freq: Two times a day (BID) | ORAL | Status: DC | PRN

## 2019-09-07 MED ORDER — LIDOCAINE 2% (20 MG/ML) 5 ML SYRINGE
INTRAMUSCULAR | Status: DC | PRN
  Administered 2019-09-07: 60 mg via INTRAVENOUS

## 2019-09-07 MED ORDER — FENTANYL CITRATE (PF) 250 MCG/5ML IJ SOLN
INTRAMUSCULAR | Status: AC
  Filled 2019-09-07: qty 5

## 2019-09-07 MED ORDER — MIDAZOLAM HCL 2 MG/2ML IJ SOLN
INTRAMUSCULAR | Status: AC
  Administered 2019-09-07: 2 mg via INTRAVENOUS
  Filled 2019-09-07: qty 2

## 2019-09-07 MED ORDER — ROCURONIUM BROMIDE 100 MG/10ML IV SOLN
INTRAVENOUS | Status: DC | PRN
  Administered 2019-09-07: 60 mg via INTRAVENOUS
  Administered 2019-09-07: 10 mg via INTRAVENOUS

## 2019-09-07 MED ORDER — DEXAMETHASONE SODIUM PHOSPHATE 10 MG/ML IJ SOLN
INTRAMUSCULAR | Status: DC | PRN
  Administered 2019-09-07: 5 mg
  Administered 2019-09-07: 10 mg via INTRAVENOUS

## 2019-09-07 MED ORDER — ACETAMINOPHEN 325 MG PO TABS
325.0000 mg | ORAL_TABLET | Freq: Four times a day (QID) | ORAL | Status: DC
  Administered 2019-09-07: 325 mg via ORAL
  Filled 2019-09-07: qty 1

## 2019-09-07 MED ORDER — TECHNETIUM TC 99M SULFUR COLLOID FILTERED
1.0000 | Freq: Once | INTRAVENOUS | Status: AC | PRN
  Administered 2019-09-07: 09:00:00 1 via INTRADERMAL

## 2019-09-07 MED ORDER — POLYETHYLENE GLYCOL 3350 17 G PO PACK: 17.0000 g | PACK | Freq: Every day | ORAL | Status: DC | PRN

## 2019-09-07 MED ORDER — HYDROMORPHONE HCL 1 MG/ML IJ SOLN
0.5000 mg | INTRAMUSCULAR | Status: AC | PRN
  Administered 2019-09-07: 0.5 mg via INTRAVENOUS
  Administered 2019-09-07: 0.25 mg via INTRAVENOUS
  Administered 2019-09-07 (×2): 0.5 mg via INTRAVENOUS

## 2019-09-07 MED ORDER — DEXAMETHASONE SODIUM PHOSPHATE 10 MG/ML IJ SOLN
INTRAMUSCULAR | Status: AC
  Filled 2019-09-07: qty 1

## 2019-09-07 MED ORDER — DIPHENHYDRAMINE HCL 12.5 MG/5ML PO ELIX: 12.5000 mg | ORAL_SOLUTION | Freq: Four times a day (QID) | ORAL | Status: DC | PRN

## 2019-09-07 MED ORDER — KCL IN DEXTROSE-NACL 20-5-0.45 MEQ/L-%-% IV SOLN
INTRAVENOUS | Status: DC
  Administered 2019-09-07: 19:00:00 via INTRAVENOUS
  Filled 2019-09-07: qty 1000

## 2019-09-07 MED ORDER — CELECOXIB 200 MG PO CAPS
200.0000 mg | ORAL_CAPSULE | ORAL | Status: AC
  Administered 2019-09-07: 200 mg via ORAL
  Filled 2019-09-07: qty 1

## 2019-09-07 MED ORDER — BUPIVACAINE-EPINEPHRINE 0.25% -1:200000 IJ SOLN: INTRAMUSCULAR | Status: DC | PRN

## 2019-09-07 MED ORDER — MIDAZOLAM HCL 2 MG/2ML IJ SOLN
2.0000 mg | Freq: Once | INTRAMUSCULAR | Status: AC
  Administered 2019-09-07: 09:00:00 2 mg via INTRAVENOUS

## 2019-09-07 MED ORDER — SUGAMMADEX SODIUM 200 MG/2ML IV SOLN
INTRAVENOUS | Status: DC | PRN
  Administered 2019-09-07: 200 mg via INTRAVENOUS

## 2019-09-07 MED ORDER — LACTATED RINGERS IV SOLN
Freq: Once | INTRAVENOUS | Status: AC
  Administered 2019-09-07: 08:00:00 via INTRAVENOUS

## 2019-09-07 MED ORDER — PROPOFOL 10 MG/ML IV BOLUS
INTRAVENOUS | Status: DC | PRN
  Administered 2019-09-07: 140 mg via INTRAVENOUS

## 2019-09-07 MED ORDER — BUPIVACAINE-EPINEPHRINE (PF) 0.25% -1:200000 IJ SOLN
INTRAMUSCULAR | Status: AC
  Filled 2019-09-07: qty 30

## 2019-09-07 MED ORDER — SENNA 8.6 MG PO TABS
1.0000 | ORAL_TABLET | Freq: Two times a day (BID) | ORAL | Status: DC
  Administered 2019-09-08: 8.6 mg via ORAL
  Filled 2019-09-07 (×2): qty 1

## 2019-09-07 MED ORDER — EPHEDRINE 5 MG/ML INJ
INTRAVENOUS | Status: AC
  Filled 2019-09-07: qty 10

## 2019-09-07 MED ORDER — ROPIVACAINE HCL 5 MG/ML IJ SOLN
INTRAMUSCULAR | Status: DC | PRN
  Administered 2019-09-07: 30 mL via PERINEURAL

## 2019-09-07 MED ORDER — LACTATED RINGERS IV SOLN
INTRAVENOUS | Status: DC | PRN
  Administered 2019-09-07: 08:00:00 via INTRAVENOUS

## 2019-09-07 MED ORDER — CEFAZOLIN SODIUM-DEXTROSE 2-4 GM/100ML-% IV SOLN
2.0000 g | INTRAVENOUS | Status: AC
  Administered 2019-09-07: 2 g via INTRAVENOUS
  Filled 2019-09-07: qty 100

## 2019-09-07 MED ORDER — EPHEDRINE SULFATE-NACL 50-0.9 MG/10ML-% IV SOSY
PREFILLED_SYRINGE | INTRAVENOUS | Status: DC | PRN
  Administered 2019-09-07: 5 mg via INTRAVENOUS

## 2019-09-07 MED ORDER — FENTANYL CITRATE (PF) 100 MCG/2ML IJ SOLN
INTRAMUSCULAR | Status: AC
  Administered 2019-09-07: 50 ug via INTRAVENOUS
  Filled 2019-09-07: qty 2

## 2019-09-07 MED ORDER — ONDANSETRON HCL 4 MG/2ML IJ SOLN
INTRAMUSCULAR | Status: AC
  Filled 2019-09-07: qty 2

## 2019-09-07 MED ORDER — GABAPENTIN 300 MG PO CAPS
300.0000 mg | ORAL_CAPSULE | ORAL | Status: AC
  Administered 2019-09-07: 300 mg via ORAL
  Filled 2019-09-07: qty 1

## 2019-09-07 MED ORDER — ONDANSETRON HCL 4 MG/2ML IJ SOLN
INTRAMUSCULAR | Status: DC | PRN
  Administered 2019-09-07 (×2): 4 mg via INTRAVENOUS

## 2019-09-07 MED ORDER — HYDROCODONE-ACETAMINOPHEN 5-325 MG PO TABS
ORAL_TABLET | ORAL | Status: AC
  Filled 2019-09-07: qty 2

## 2019-09-07 MED ORDER — HYDROMORPHONE HCL 1 MG/ML IJ SOLN
INTRAMUSCULAR | Status: AC
  Administered 2019-09-07: 0.25 mg
  Filled 2019-09-07: qty 1

## 2019-09-07 MED ORDER — HYDROMORPHONE HCL 1 MG/ML IJ SOLN
1.0000 mg | INTRAMUSCULAR | Status: DC | PRN
  Administered 2019-09-07: 1 mg via INTRAVENOUS

## 2019-09-07 MED ORDER — PHENYLEPHRINE 40 MCG/ML (10ML) SYRINGE FOR IV PUSH (FOR BLOOD PRESSURE SUPPORT)
PREFILLED_SYRINGE | INTRAVENOUS | Status: AC
  Filled 2019-09-07: qty 10

## 2019-09-07 MED ORDER — ONDANSETRON HCL 4 MG/2ML IJ SOLN: 4.0000 mg | Freq: Four times a day (QID) | INTRAMUSCULAR | Status: DC | PRN

## 2019-09-07 MED ORDER — FENTANYL CITRATE (PF) 100 MCG/2ML IJ SOLN
50.0000 ug | Freq: Once | INTRAMUSCULAR | Status: AC
  Administered 2019-09-07: 09:00:00 50 ug via INTRAVENOUS

## 2019-09-07 MED ORDER — CEFAZOLIN SODIUM-DEXTROSE 2-4 GM/100ML-% IV SOLN
2.0000 g | Freq: Three times a day (TID) | INTRAVENOUS | Status: DC
  Administered 2019-09-07 – 2019-09-08 (×2): 2 g via INTRAVENOUS
  Filled 2019-09-07 (×3): qty 100

## 2019-09-07 MED ORDER — ROCURONIUM BROMIDE 10 MG/ML (PF) SYRINGE
PREFILLED_SYRINGE | INTRAVENOUS | Status: AC
  Filled 2019-09-07: qty 10

## 2019-09-07 MED ORDER — LIDOCAINE 2% (20 MG/ML) 5 ML SYRINGE
INTRAMUSCULAR | Status: AC
  Filled 2019-09-07: qty 5

## 2019-09-07 MED ORDER — SODIUM CHLORIDE (PF) 0.9 % IJ SOLN
INTRAMUSCULAR | Status: AC
  Filled 2019-09-07: qty 10

## 2019-09-07 MED ORDER — PHENYLEPHRINE 40 MCG/ML (10ML) SYRINGE FOR IV PUSH (FOR BLOOD PRESSURE SUPPORT)
PREFILLED_SYRINGE | INTRAVENOUS | Status: DC | PRN
  Administered 2019-09-07: 80 ug via INTRAVENOUS
  Administered 2019-09-07: 40 ug via INTRAVENOUS
  Administered 2019-09-07: 80 ug via INTRAVENOUS
  Administered 2019-09-07: 40 ug via INTRAVENOUS
  Administered 2019-09-07 (×3): 80 ug via INTRAVENOUS
  Administered 2019-09-07: 40 ug via INTRAVENOUS

## 2019-09-07 MED ORDER — HYDROCODONE-ACETAMINOPHEN 5-325 MG PO TABS
1.0000 | ORAL_TABLET | ORAL | Status: DC | PRN
  Administered 2019-09-07 – 2019-09-08 (×3): 2 via ORAL
  Filled 2019-09-07 (×3): qty 2

## 2019-09-07 MED ORDER — FENTANYL CITRATE (PF) 250 MCG/5ML IJ SOLN
INTRAMUSCULAR | Status: DC | PRN
  Administered 2019-09-07 (×2): 50 ug via INTRAVENOUS
  Administered 2019-09-07: 25 ug via INTRAVENOUS
  Administered 2019-09-07: 50 ug via INTRAVENOUS
  Administered 2019-09-07: 25 ug via INTRAVENOUS

## 2019-09-07 SURGICAL SUPPLY — 105 items
ADH SKN CLS APL DERMABOND .7 (GAUZE/BANDAGES/DRESSINGS) ×2
APL PRP STRL LF DISP 70% ISPRP (MISCELLANEOUS) ×2
APPLIER CLIP 9.375 MED OPEN (MISCELLANEOUS) ×3
APR CLP MED 9.3 20 MLT OPN (MISCELLANEOUS) ×2
BAG DECANTER FOR FLEXI CONT (MISCELLANEOUS) ×3 IMPLANT
BINDER BREAST LRG (GAUZE/BANDAGES/DRESSINGS) IMPLANT
BINDER BREAST XLRG (GAUZE/BANDAGES/DRESSINGS) IMPLANT
BIOPATCH RED 1 DISK 7.0 (GAUZE/BANDAGES/DRESSINGS) ×7 IMPLANT
BLADE SURG 10 STRL SS (BLADE) ×3 IMPLANT
BLADE SURG 15 STRL LF DISP TIS (BLADE) ×2 IMPLANT
BLADE SURG 15 STRL SS (BLADE) ×3
CANISTER SUCT 3000ML PPV (MISCELLANEOUS) ×5 IMPLANT
CHLORAPREP W/TINT 26 (MISCELLANEOUS) ×5 IMPLANT
CLIP APPLIE 9.375 MED OPEN (MISCELLANEOUS) ×4 IMPLANT
CONNECTOR 5 IN 1 STRAIGHT STRL (MISCELLANEOUS) ×3 IMPLANT
CONT SPEC 4OZ CLIKSEAL STRL BL (MISCELLANEOUS) ×3 IMPLANT
COVER PROBE W GEL 5X96 (DRAPES) ×3 IMPLANT
COVER SURGICAL LIGHT HANDLE (MISCELLANEOUS) ×5 IMPLANT
COVER WAND RF STERILE (DRAPES) ×4 IMPLANT
DECANTER SPIKE VIAL GLASS SM (MISCELLANEOUS) ×3 IMPLANT
DERMABOND ADVANCED (GAUZE/BANDAGES/DRESSINGS) ×1
DERMABOND ADVANCED .7 DNX12 (GAUZE/BANDAGES/DRESSINGS) ×6 IMPLANT
DEVICE DISSECT PLASMABLAD 3.0S (MISCELLANEOUS) IMPLANT
DRAIN CHANNEL 19F RND (DRAIN) ×7 IMPLANT
DRAPE CHEST BREAST 15X10 FENES (DRAPES) ×3 IMPLANT
DRAPE HALF SHEET 40X57 (DRAPES) ×6 IMPLANT
DRAPE INCISE 23X17 IOBAN STRL (DRAPES) ×1
DRAPE INCISE 23X17 STRL (DRAPES) ×2 IMPLANT
DRAPE INCISE IOBAN 23X17 STRL (DRAPES) ×2 IMPLANT
DRAPE INCISE IOBAN 85X60 (DRAPES) IMPLANT
DRAPE ORTHO SPLIT 77X108 STRL (DRAPES) ×6
DRAPE SURG 17X23 STRL (DRAPES) ×12 IMPLANT
DRAPE SURG ORHT 6 SPLT 77X108 (DRAPES) ×4 IMPLANT
DRAPE WARM FLUID 44X44 (DRAPES) ×3 IMPLANT
DRSG MEPILEX BORDER 4X8 (GAUZE/BANDAGES/DRESSINGS) ×2 IMPLANT
DRSG PAD ABDOMINAL 8X10 ST (GAUZE/BANDAGES/DRESSINGS) ×8 IMPLANT
DRSG TEGADERM 4X4.75 (GAUZE/BANDAGES/DRESSINGS) ×3 IMPLANT
ELECT BLADE 4.0 EZ CLEAN MEGAD (MISCELLANEOUS) ×3
ELECT BLADE 6.5 EXT (BLADE) IMPLANT
ELECT CAUTERY BLADE 6.4 (BLADE) ×6 IMPLANT
ELECT REM PT RETURN 9FT ADLT (ELECTROSURGICAL) ×3
ELECTRODE BLDE 4.0 EZ CLN MEGD (MISCELLANEOUS) ×2 IMPLANT
ELECTRODE REM PT RTRN 9FT ADLT (ELECTROSURGICAL) ×4 IMPLANT
EVACUATOR SILICONE 100CC (DRAIN) ×7 IMPLANT
FILTER IN LINE W/DETACHED HOSE (FILTER) ×2 IMPLANT
GAUZE SPONGE 4X4 12PLY STRL (GAUZE/BANDAGES/DRESSINGS) ×5 IMPLANT
GLOVE BIO SURGEON STRL SZ 6.5 (GLOVE) ×8 IMPLANT
GLOVE BIO SURGEON STRL SZ7 (GLOVE) ×1 IMPLANT
GLOVE BIO SURGEON STRL SZ7.5 (GLOVE) ×3 IMPLANT
GLOVE SURG SS PI 6.5 STRL IVOR (GLOVE) ×1 IMPLANT
GOWN STRL REUS W/ TWL LRG LVL3 (GOWN DISPOSABLE) ×8 IMPLANT
GOWN STRL REUS W/TWL LRG LVL3 (GOWN DISPOSABLE) ×15
GRAFT FLEX HD 4X16 THICK (Tissue Mesh) ×1 IMPLANT
IMPL EXPANDER BREAST 535CC (Breast) IMPLANT
IMPLANT BREAST 535CC (Breast) ×1 IMPLANT
IMPLANT EXPANDER BREAST 535CC (Breast) ×2 IMPLANT
KIT BASIN OR (CUSTOM PROCEDURE TRAY) ×5 IMPLANT
KIT TURNOVER KIT B (KITS) ×5 IMPLANT
LIGHT WAVEGUIDE WIDE FLAT (MISCELLANEOUS) ×1 IMPLANT
NDL 18GX1X1/2 (RX/OR ONLY) (NEEDLE) IMPLANT
NDL 21 GA WING INFUSION (NEEDLE) IMPLANT
NDL FILTER BLUNT 18X1 1/2 (NEEDLE) IMPLANT
NDL HYPO 25GX1X1/2 BEV (NEEDLE) ×2 IMPLANT
NEEDLE 18GX1X1/2 (RX/OR ONLY) (NEEDLE) IMPLANT
NEEDLE 21 GA WING INFUSION (NEEDLE) ×3 IMPLANT
NEEDLE FILTER BLUNT 18X 1/2SAF (NEEDLE)
NEEDLE FILTER BLUNT 18X1 1/2 (NEEDLE) IMPLANT
NEEDLE HYPO 25GX1X1/2 BEV (NEEDLE) ×3 IMPLANT
NS IRRIG 1000ML POUR BTL (IV SOLUTION) ×8 IMPLANT
PACK GENERAL/GYN (CUSTOM PROCEDURE TRAY) ×5 IMPLANT
PAD ARMBOARD 7.5X6 YLW CONV (MISCELLANEOUS) ×5 IMPLANT
PENCIL BUTTON HOLSTER BLD 10FT (ELECTRODE) ×2 IMPLANT
PENCIL SMOKE EVACUATOR (MISCELLANEOUS) ×3 IMPLANT
PIN SAFETY STERILE (MISCELLANEOUS) ×5 IMPLANT
PLASMABLADE 3.0S (MISCELLANEOUS) ×3
SET ASEPTIC TRANSFER (MISCELLANEOUS) ×1 IMPLANT
SPECIMEN JAR X LARGE (MISCELLANEOUS) ×3 IMPLANT
SPONGE LAP 18X18 RF (DISPOSABLE) ×2 IMPLANT
STAPLER VISISTAT 35W (STAPLE) ×3 IMPLANT
STRIP CLOSURE SKIN 1/2X4 (GAUZE/BANDAGES/DRESSINGS) IMPLANT
SUT ETHILON 3 0 FSL (SUTURE) ×4 IMPLANT
SUT MNCRL AB 3-0 PS2 18 (SUTURE) ×6 IMPLANT
SUT MNCRL AB 4-0 PS2 18 (SUTURE) ×8 IMPLANT
SUT MON AB 5-0 PS2 18 (SUTURE) ×6 IMPLANT
SUT PDS AB 2-0 CT1 27 (SUTURE) ×11 IMPLANT
SUT PDS AB 3-0 SH 27 (SUTURE) IMPLANT
SUT SILK 3 0 (SUTURE) ×3
SUT SILK 3 0 FS 1X18 (SUTURE) ×1 IMPLANT
SUT SILK 3 0 SH 30 (SUTURE) ×2 IMPLANT
SUT SILK 3-0 FS1 18XBRD (SUTURE) ×4 IMPLANT
SUT VIC AB 0 CT1 27 (SUTURE)
SUT VIC AB 0 CT1 27XBRD ANBCTR (SUTURE) ×4 IMPLANT
SUT VIC AB 3-0 54X BRD REEL (SUTURE) IMPLANT
SUT VIC AB 3-0 BRD 54 (SUTURE)
SUT VIC AB 3-0 SH 18 (SUTURE) ×4 IMPLANT
SUT VIC AB 3-0 SH 27 (SUTURE)
SUT VIC AB 3-0 SH 27X BRD (SUTURE) ×6 IMPLANT
SUT VICRYL 4-0 PS2 18IN ABS (SUTURE) ×4 IMPLANT
SYR BULB IRRIGATION 50ML (SYRINGE) ×3 IMPLANT
SYR CONTROL 10ML LL (SYRINGE) ×2 IMPLANT
TOWEL GREEN STERILE (TOWEL DISPOSABLE) ×6 IMPLANT
TOWEL GREEN STERILE FF (TOWEL DISPOSABLE) ×6 IMPLANT
TRAY FOLEY MTR SLVR 14FR STAT (SET/KITS/TRAYS/PACK) ×1 IMPLANT
TUBE CONNECTING 12X1/4 (SUCTIONS) ×5 IMPLANT
YANKAUER SUCT BULB TIP NO VENT (SUCTIONS) ×3 IMPLANT

## 2019-09-07 NOTE — Anesthesia Preprocedure Evaluation (Addendum)
Anesthesia Evaluation  Patient identified by MRN, date of birth, ID band Patient awake    Reviewed: Allergy & Precautions, NPO status , Patient's Chart, lab work & pertinent test results, reviewed documented beta blocker date and time   Airway Mallampati: III  TM Distance: >3 FB Neck ROM: Full    Dental no notable dental hx. (+) Teeth Intact, Dental Advisory Given   Pulmonary neg pulmonary ROS, Patient abstained from smoking., former smoker,    Pulmonary exam normal breath sounds clear to auscultation       Cardiovascular hypertension, Pt. on home beta blockers and Pt. on medications Normal cardiovascular exam+ Valvular Problems/Murmurs MVP  Rhythm:Regular Rate:Normal  HLD  TTE 2018 - Left ventricle: The cavity size was normal. Wall thickness was normal. Systolic function was normal. The estimated ejection fraction was in the range of 60% to 65%. Wall motion was normal; there were no regional wall motion abnormalities. Doppler parameters are consistent with abnormal left ventricular relaxation (grade 1 diastolic dysfunction). - Mitral valve: Mild bileaflet prolapse with mild central   regurgitation.   Neuro/Psych PSYCHIATRIC DISORDERS Anxiety Depression    GI/Hepatic Neg liver ROS, GERD  Controlled and Medicated,  Endo/Other  negative endocrine ROS  Renal/GU negative Renal ROS  negative genitourinary   Musculoskeletal  (+) Arthritis ,   Abdominal   Peds  Hematology negative hematology ROS (+)   Anesthesia Other Findings   Reproductive/Obstetrics                            Anesthesia Physical Anesthesia Plan  ASA: III  Anesthesia Plan: General and Regional   Post-op Pain Management:  Regional for Post-op pain   Induction: Intravenous  PONV Risk Score and Plan: 3 and Midazolam, Dexamethasone and Ondansetron  Airway Management Planned: Oral ETT and LMA  Additional Equipment:    Intra-op Plan:   Post-operative Plan: Extubation in OR  Informed Consent: I have reviewed the patients History and Physical, chart, labs and discussed the procedure including the risks, benefits and alternatives for the proposed anesthesia with the patient or authorized representative who has indicated his/her understanding and acceptance.     Dental advisory given  Plan Discussed with: CRNA  Anesthesia Plan Comments:         Anesthesia Quick Evaluation

## 2019-09-07 NOTE — Interval H&P Note (Signed)
History and Physical Interval Note:  09/07/2019 7:57 AM  [name]  has presented today for surgery, with the diagnosis of RIGHT BREAST CANCER.  The various methods of treatment have been discussed with the patient and family. After consideration of risks, benefits and other options for treatment, the patient has consented to  Procedure(s): RIGHT MASTECTOMY WITH SENTINEL LYMPH NODE BIOPSY (Right) BREAST RECONSTRUCTION WITH PLACEMENT OF TISSUE EXPANDER AND FLEX HD (ACELLULAR HYDRATED DERMIS) (Right) POSSIBLE LATISSIMUS FLAP TO BREAST (Right) as a surgical intervention.  The patient's history has been reviewed, patient examined, no change in status, stable for surgery.  I have reviewed the patient's chart and labs.  Questions were answered to the patient's satisfaction.     Loel Lofty Jyair Kiraly

## 2019-09-07 NOTE — Anesthesia Procedure Notes (Signed)
Procedure Name: Intubation Date/Time: 09/07/2019 9:14 AM Performed by: Janene Harvey, CRNA Pre-anesthesia Checklist: Patient identified, Emergency Drugs available, Suction available and Patient being monitored Patient Re-evaluated:Patient Re-evaluated prior to induction Oxygen Delivery Method: Circle system utilized Preoxygenation: Pre-oxygenation with 100% oxygen Induction Type: IV induction Ventilation: Mask ventilation without difficulty Laryngoscope Size: Mac and 4 Grade View: Grade II Tube type: Oral Tube size: 7.0 mm Number of attempts: 1 Airway Equipment and Method: Stylet and Oral airway Placement Confirmation: ETT inserted through vocal cords under direct vision,  positive ETCO2 and breath sounds checked- equal and bilateral Secured at: 22 cm Tube secured with: Tape Dental Injury: Teeth and Oropharynx as per pre-operative assessment

## 2019-09-07 NOTE — Discharge Instructions (Signed)
INSTRUCTIONS FOR AFTER BREAST SURGERY   You are getting ready to undergo breast surgery.  You will likely have some questions about what to expect following your operation.  The following information will help you and your family understand what to expect when you are discharged from the hospital.  Following these guidelines will help ensure a smooth recovery and reduce risks of complications.   Postoperative instructions include information on: diet, wound care, medications and physical activity.  AFTER SURGERY Expect to go home after the procedure.  In some cases, you may need to spend one night in the hospital for observation.  DIET Breast surgery does not require a specific diet.  However, I have to mention that the healthier you eat the better your body can start healing. It is important to increasing your protein intake.  This means limiting the foods with sugar and carbohydrates.  Focus on vegetables and some meat.  If you have any liposuction during your procedure be sure to drink water.  If your urine is bright yellow, then it is concentrated, and you need to drink more water.  As a general rule after surgery, you should have 8 ounces of water every hour while awake.  If you find you are persistently nauseated or unable to take in liquids let us know.  NO TOBACCO USE or EXPOSURE.  This will slow your healing process and increase the risk of a wound.  WOUND CARE If you have a drain: You can shower five days after surgery.  Clean with baby wipes until the drain is removed.   If you have steri-strips / tape directly attached to your skin leave them in place. It is OK to get these wet.  No baths, pools or hot tubs for two weeks. We close your incision to leave the smallest and best-looking scar. No ointment or creams on your incisions until given the go ahead.  Especially not Neosporin (Too many skin reactions with this one).  A few weeks after surgery you can use Mederma and start massaging the  scar. We ask you to wear your binder or sports bra for the first 6 weeks around the clock, including while sleeping. This provides added comfort and helps reduce the fluid accumulation at the surgery site.  ACTIVITY No heavy lifting until cleared by the doctor.  This usually means no more than a half-gallon of milk.  It is OK to walk and climb stairs. In fact, moving your legs is very important to decrease your risk of a blood clot.  It will also help keep you from getting deconditioned.  Every 1 to 2 hours get up and walk for 5 minutes. This will help with a quicker recovery back to normal.  Let pain be your guide so you don't do too much.  NO, you cannot do the spring cleaning and don't plan on taking care of anyone else.  This is your time for TLC.  You will be more comfortable if you sleep and rest with your head elevated either with a few pillows under you or in a recliner.  No stomach sleeping for a few months.  WORK Everyone returns to work at different times. As a rough guide, most people take at least 1 - 2 weeks off prior to returning to work. If you need documentation for your job, bring the forms to your postoperative follow up visit.  DRIVING Arrange for someone to bring you home from the hospital.  You may be able to  drive a few days after surgery but not while taking any narcotics or valium.  BOWEL MOVEMENTS Constipation can occur after anesthesia and while taking pain medication.  It is important to stay ahead for your comfort.  We recommend taking Milk of Magnesia (2 tablespoons; twice a day) while taking the pain pills.  SEROMA This is fluid your body tried to put in the surgical site.  This is normal but if it creates tight skinny skin let us know.  It usually decreases in a few weeks.  WHEN TO CALL Call your surgeon's office if any of the following occur:  Fever 101 degrees F or greater  Excessive bleeding or fluid from the incision site.  Pain that increases over time  without aid from the medications  Redness, warmth, or pus draining from incision sites  Persistent nausea or inability to take in liquids  Severe misshapen area that underwent the operation.  Here are some resources:  1. Plastic surgery website: https://www.plasticsurgery.org/for-medical-professionals/education-and-resources/publications/breast-reconstruction-magazine 2. Breast Reconstruction Awareness Campaign:  [employer]co[employer]nz 3. Plastic surgery Implant information:  https://www.plasticsurgery.org/patient-safety/breast-implant-safety  [facility] Plastic Surgery Specialist  What is the benefit of having a drain?  During surgery your tissue layers are separated.  This raw surface stimulates your body to fill the space with serous fluid.  This is normal but you don't want that fluid to collect and prevent healing.  A fluid collection can also become infected.  The Custodio-Pratt (JP) drain is used to eliminate this collection of fluid and allow the tissue to heal together.    Mcconnon-Pratt (JP) bulb    How to care for your drainage and suction unit at home Your drainage catheter will be connected to a collection device. The vacuum caused when the device is compressed allows drainage to collect in the device.     Wash your hands with soap and water before and after touching the system.  Empty the JP drain every 12 hours once you get home from your procedure.  Record the fluid amount on the record sheet included.  Start with stripping the drain tube to push the clots or excess fluid to the bulb.  Do this by pinching the tube with one hand near your skin.  Then with the other hand squeeze the tubing and work it toward the bulb.  This should be done several times a day.  This may collapse the tube which will correct on its own.    Use a safety pin to attach your collection device to your clothing so there is no tension on the insertion site.    If you have drainage at the skin  insertion site, you can apply a gauze dressing and secure it with tape.  If the drain falls out, apply a gauze dressing over the drain insertion site and secure with tape.   To empty the collection device:    Release the stopper on the top of the collection unit (bulb).   Pour contents into a measuring container such as a plastic medicine cup.   Record the day and amount of drainage on the attached sheet.  This should be done at least twice a day.    To compress the Swarthout-Pratt Bulb:   Release the stopper at the top of the bulb.  Squeeze the bulb tightly in your fist, squeezing air out of the bulb.   Replace the stopper while the bulb is compressed.   Be careful not to spill the contents when squeezing the bulb.  The drainage will  start bright red and turn to pink and then yellow with time.  IMPORTANT: If the bulb is not squeezed before adding the stopper it will not draw out the fluid.  Care for the JP drain site and your skin daily:   You may shower three days after surgery.  Secure the drain to a ribbon or cloth around your waist while showering so it does not pull out while showering.  Be sure your hands are cleaned with soap and water.  Use a clean wet cotton swab to clean the skin around the drain site.   Use another cotton swab to place Vaseline or antibiotic ointment on the skin around the drain.     Contact your physician if any of the following occur:   The fluid in the bulb becomes cloudy.  Your temperature is greater than 101.4.   The incision opens.  If you have drainage at the skin insertion site, you can apply a gauze dressing and secure it with tape.  If the drain falls out, apply a gauze dressing over the drain insertion site and secure with tape.   You will usually have more drainage when you are active than while you rest or are asleep. If the drainage increases significantly or is bloody call the physician                             Bring  this record with you to each office visit Date  Drainage Volume  Date   Drainage volume

## 2019-09-07 NOTE — Transfer of Care (Signed)
Immediate Anesthesia Transfer of Care Note  Patient: Rachel Sutton  Procedure(s) Performed: RIGHT MASTECTOMY WITH SENTINEL LYMPH NODE BIOPSY (Right Breast) RIGHT BREAST RECONSTRUCTION WITH PLACEMENT OF TISSUE EXPANDER AND FLEX HD (ACELLULAR HYDRATED DERMIS) (Right Breast)  Patient Location: PACU  Anesthesia Type:General  Level of Consciousness: drowsy  Airway & Oxygen Therapy: Patient Spontanous Breathing and Patient connected to face mask oxygen  Post-op Assessment: Report given to RN and Post -op Vital signs reviewed and stable  Post vital signs: Reviewed  Last Vitals:  Vitals Value Taken Time  BP 115/53 09/07/19 1113  Temp    Pulse 77 09/07/19 1117  Resp 17 09/07/19 1117  SpO2 100 % 09/07/19 1117  Vitals shown include unvalidated device data.  Last Pain:  Vitals:   09/07/19 0845  TempSrc:   PainSc: 0-No pain      Patients Stated Pain Goal: 3 ([id number])  Complications: No apparent anesthesia complications

## 2019-09-07 NOTE — Anesthesia Procedure Notes (Signed)
Anesthesia Regional Block: Pectoralis block   Pre-Anesthetic Checklist: ,, timeout performed, Correct Patient, Correct Site, Correct Laterality, Correct Procedure, Correct Position, site marked, Risks and benefits discussed,  Surgical consent,  Pre-op evaluation,  At surgeon's request and post-op pain management  Laterality: Right  Prep: Maximum Sterile Barrier Precautions used, chloraprep       Needles:  Injection technique: Single-shot  Needle Type: Echogenic Stimulator Needle     Needle Length: 9cm  Needle Gauge: 22     Additional Needles:   Procedures:,,,, ultrasound used (permanent image in chart),,,,  Narrative:  Start time: 09/07/2019 8:31 AM End time: 09/07/2019 8:41 AM Injection made incrementally with aspirations every 5 mL.  Performed by: Personally  Anesthesiologist: Freddrick March, MD  Additional Notes: Monitors applied. No increased pain on injection. No increased resistance to injection. Injection made in 5cc increments. Good needle visualization. Patient tolerated procedure well.

## 2019-09-07 NOTE — Interval H&P Note (Signed)
History and Physical Interval Note:  09/07/2019 8:20 AM  [name]  has presented today for surgery, with the diagnosis of RIGHT BREAST CANCER.  The various methods of treatment have been discussed with the patient and family. After consideration of risks, benefits and other options for treatment, the patient has consented to  Procedure(s): RIGHT MASTECTOMY WITH SENTINEL LYMPH NODE BIOPSY (Right) BREAST RECONSTRUCTION WITH PLACEMENT OF TISSUE EXPANDER AND FLEX HD (ACELLULAR HYDRATED DERMIS) (Right) POSSIBLE LATISSIMUS FLAP TO BREAST (Right) as a surgical intervention.  The patient's history has been reviewed, patient examined, no change in status, stable for surgery.  I have reviewed the patient's chart and labs.  Questions were answered to the patient's satisfaction.     Autumn Messing III

## 2019-09-07 NOTE — H&P (Signed)
Rachel Sutton  Location: [facility] Surgery Patient #: [id number] DOB: 02-27-1961 Undefined / Language: [name] / Race: White Female   History of Present Illness  The patient is a 58 year old female who presents with breast cancer.We are asked to see the patient in consultation by Dr. Sondra Come to evaluate her for a new right breast cancer. The patient is a 58 year old white female who presents with a palpable mass in the outer right breast for the last week. She denies any pain or d/c. The mass measured 1.3cm and the axilla looked neg. 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 She has a h/o right breast cancer in 2003 that was ER and PR- and Her2+ and was treated with lumpectomy, chemo, and radiation. She also smokes 1/2 pk of cigs per day   Past Surgical History  Breast Mass; Local Excision  Right. Hysterectomy (not due to cancer) - Partial  Knee Surgery  Right. Oral Surgery  Spinal Surgery - Lower Back  Spinal Surgery - Neck  Tonsillectomy   Diagnostic Studies History  Colonoscopy  1-5 years ago Mammogram  within last year Pap Smear  1-5 years ago  Medication History Medications Reconciled  Social History  Caffeine use  Carbonated beverages, Coffee, Tea. No alcohol use  No drug use  Tobacco use  Current every day smoker.  Family History  Arthritis  Mother. Cancer  Brother, Father. Cerebrovascular Accident  Mother. Depression  Sister. Diabetes Mellitus  Father, Mother. Heart Disease  Father. Heart disease in female family member before age 51  Hypertension  Father. Malignant Neoplasm Of Pancreas  Mother. Respiratory Condition  Mother. Thyroid problems  Sister.  Pregnancy / Birth History  Age at menarche  61 years. Age of menopause  <45 Gravida  9 Irregular periods  Maternal age  74-20 Para  2  Other Problems Arthritis  Breast Cancer  High blood pressure  Other  disease, cancer, significant illness     Review of Systems  General Not Present- Appetite Loss, Chills, Fatigue, Fever, Night Sweats, Weight Gain and Weight Loss. Skin Present- New Lesions. Not Present- Change in Wart/Mole, Dryness, Hives, Jaundice, Non-Healing Wounds, Rash and Ulcer. HEENT Not Present- Earache, Hearing Loss, Hoarseness, Nose Bleed, Oral Ulcers, Ringing in the Ears, Seasonal Allergies, Sinus Pain, Sore Throat, Visual Disturbances, Wears glasses/contact lenses and Yellow Eyes. Respiratory Not Present- Bloody sputum, Chronic Cough, Difficulty Breathing, Snoring and Wheezing. Breast Not Present- Breast Mass, Breast Pain, Nipple Discharge and Skin Changes. Cardiovascular Not Present- Chest Pain, Difficulty Breathing Lying Down, Leg Cramps, Palpitations, Rapid Heart Rate, Shortness of Breath and Swelling of Extremities. Gastrointestinal Not Present- Abdominal Pain, Bloating, Bloody Stool, Change in Bowel Habits, Chronic diarrhea, Constipation, Difficulty Swallowing, Excessive gas, Gets full quickly at meals, Hemorrhoids, Indigestion, Nausea, Rectal Pain and Vomiting. Female Genitourinary Not Present- Frequency, Nocturia, Painful Urination, Pelvic Pain and Urgency. Musculoskeletal Not Present- Back Pain, Joint Pain, Joint Stiffness, Muscle Pain, Muscle Weakness and Swelling of Extremities. Neurological Not Present- Decreased Memory, Fainting, Headaches, Numbness, Seizures, Tingling, Tremor, Trouble walking and Weakness. Psychiatric Present- Anxiety. Not Present- Bipolar, Change in Sleep Pattern, Depression, Fearful and Frequent crying. Endocrine Not Present- Cold Intolerance, Excessive Hunger, Hair Changes, Heat Intolerance, Hot flashes and New Diabetes. Hematology Not Present- Blood Thinners, Easy Bruising, Excessive bleeding, Gland problems, HIV and Persistent Infections.   Physical Exam  General Mental Status-Alert. General Appearance-Consistent with stated  age. Hydration-Well  hydrated. Voice-Normal.  Head and Neck Head-normocephalic, atraumatic with no lesions or palpable masses. Trachea-midline. Thyroid Gland Characteristics - normal size and consistency.  Eye Eyeball - Bilateral-Extraocular movements intact. Sclera/Conjunctiva - Bilateral-No scleral icterus.  Chest and Lung Exam Chest and lung exam reveals -quiet, even and easy respiratory effort with no use of accessory muscles and on auscultation, normal breath sounds, no adventitious sounds and normal vocal resonance. Inspection Chest Wall - Normal. Back - normal.  Breast Note: there is a 1cm palpable mass in the outer right breast and some firm palpable scar in the inner right breast. there is no other palpable mass in either breast. there is no palpable axillary, supraclavicular, or cervical lymphadenopathy   Cardiovascular Cardiovascular examination reveals -normal heart sounds, regular rate and rhythm with no murmurs and normal pedal pulses bilaterally.  Abdomen Inspection Inspection of the abdomen reveals - No Hernias. Skin - Scar - no surgical scars. Palpation/Percussion Palpation and Percussion of the abdomen reveal - Soft, Non Tender, No Rebound tenderness, No Rigidity (guarding) and No hepatosplenomegaly. Auscultation Auscultation of the abdomen reveals - Bowel sounds normal.  Neurologic Neurologic evaluation reveals -alert and oriented x 3 with no impairment of recent or remote memory. Mental Status-Normal.  Musculoskeletal Normal Exam - Left-Upper Extremity Strength Normal and Lower Extremity Strength Normal. Normal Exam - Right-Upper Extremity Strength Normal and Lower Extremity Strength Normal.  Lymphatic Head & Neck  General Head & Neck Lymphatics: Bilateral - Description - Normal. Axillary  General Axillary Region: Bilateral - Description - Normal. Tenderness - Non Tender. Femoral & Inguinal  Generalized Femoral & Inguinal  Lymphatics: Bilateral - Description - Normal. Tenderness - Non Tender.    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 Because of this I would recommend mastectomy. she is still a candidate for sentinel node mapping and she is also interested in reconstruction. I have discussed with her in detail the risks and benefits of the surgery as well as some of the technical aspects and she understands and wishes to proceed. I will refer her to Plastic Surgery with Dr. Marla Roe and we can coordinate.  Current Plans Referred to Surgery - Plastic, for evaluation and follow up (Plastic Surgery). Routine.

## 2019-09-07 NOTE — Op Note (Addendum)
Op report    DATE OF OPERATION:  09/07/2019  LOCATION: [facility]  SURGICAL DIVISION: Plastic Surgery  PREOPERATIVE DIAGNOSES:  1. Right Breast cancer.    POSTOPERATIVE DIAGNOSES:  1. Right Breast cancer.   PROCEDURE:  1. Right immediate breast reconstruction with placement of Acellular Dermal Matrix and tissue expanders.  SURGEON: Daven Montz Sanger Sherina Stammer, DO  ASSISTANT: Roetta Sessions, PA  ANESTHESIA:  General.   COMPLICATIONS: None.   IMPLANTS: Right - Mentor 535 cc. Ref [id number].  150 cc of injectable saline placed in the expander. Acellular Dermal Matrix - Flex HD 4 x 16 cm   INDICATIONS FOR PROCEDURE:  The patient, Rachel Sutton, is a 58 y.o. female born on [birth year], is here for  immediate first stage breast reconstruction with placement of right tissue expander and Acellular dermal matrix.  She had radiation and therefore we may need to do a latissimus flap in the future. MRN: [id number]  CONSENT:  Informed consent was obtained directly from the patient. Risks, benefits and alternatives were fully discussed. Specific risks including but not limited to bleeding, infection, hematoma, seroma, scarring, pain, implant infection, implant extrusion, capsular contracture, asymmetry, wound healing problems, and need for further surgery were all discussed. The patient did have an ample opportunity to have her questions answered to her satisfaction.   DESCRIPTION OF PROCEDURE:  The patient was taken to the operating room by the general surgery team. SCDs were placed and IV antibiotics were given. The patient's chest was prepped and draped in a sterile fashion. A time out was performed and the implants to be used were identified.  right mastectomy was performed.  Once the general surgery team had completed their portion of the case the patient was rendered to the plastic and reconstructive surgery team.  Right:  The pectoralis major muscle was  lifted from the chest wall with release of the lateral edge and lateral inframammary fold.  The pocket was irrigated with antibiotic solution and hemostasis was achieved with electrocautery.  The ADM was then prepared according to the manufacture guidelines and slits placed to help with postoperative fluid management.  The ADM was then sutured to the inferior and lateral edge of the inframammary fold with 2-0 PDS starting with an interrupted stitch and then a running stitch.  The lateral portion was sutured to with interrupted sutures after the expander was placed.  The expander was prepared according to the manufacture guidelines, the air evacuated and then it was placed under the ADM and pectoralis major muscle.  The inferior and lateral tabs were used to secure the expander to the chest wall with 2-0 PDS.  The drain was placed at the inframammary fold over the ADM and secured to the skin with 3-0 Silk.    The deep layers were closed with 3-0 Monocryl followed by 4-0 Monocryl.  The skin was closed with 5-0 Monocryl and then dermabond was applied.  The ABDs and breast binder were placed.  The patient tolerated the procedure well and there were no complications.  The patient was allowed to wake from anesthesia and taken to the recovery room in satisfactory condition.   The advanced practice practitioner (APP) assisted throughout the case.  The APP was essential in retraction and counter traction when needed to make the case progress smoothly.  This retraction and assistance made it possible to see the tissue plans for the procedure.  The assistance was needed for blood control, tissue re-approximation and assisted with closure of  the incision site.

## 2019-09-07 NOTE — Op Note (Signed)
09/07/2019  10:21 AM  PATIENT:  Rachel Sutton  57 y.o. female  PRE-OPERATIVE DIAGNOSIS:  RIGHT BREAST CANCER  POST-OPERATIVE DIAGNOSIS:  RIGHT BREAST CANCER  PROCEDURE:  Procedure(s): RIGHT MASTECTOMY WITH DEEP RIGHT AXILLARY SENTINEL LYMPH NODE BIOPSY   SURGEON:  Surgeon(s) and Role: Panel 1:    * Jovita Kussmaul, MD - Primary    * Gosai, Puja, PA-C - Assisting  PHYSICIAN ASSISTANT:   ASSISTANTS: none   ANESTHESIA:   general  EBL:  20 mL   BLOOD ADMINISTERED:none  DRAINS: none   LOCAL MEDICATIONS USED:  NONE  SPECIMEN:  Source of Specimen:  right mastectomy with sentinel nodes x 2  DISPOSITION OF SPECIMEN:  PATHOLOGY  COUNTS:  YES  TOURNIQUET:  * No tourniquets in log *  DICTATION: .Dragon Dictation   After informed consent was obtained the patient was brought to the operating room and placed in the supine position on the operating table.  After adequate induction of general anesthesia the patient's right chest, breast, and axillary area were prepped with ChloraPrep, allowed to dry, and draped in usual sterile manner.  An appropriate timeout was performed.  Earlier in the day the patient underwent injection of 1 mCi of technetium sulfur colloid in the subareolar position on the right.  The neoprobe was set to technetium and area of weak radioactivity was readily identified in the right axilla.  Next an elliptical incision was made with a 10 blade knife around the nipple and areole in order to spare the skin as much as possible.  The incision was carried through the skin and subcutaneous tissue sharply with the plasma blade.  Breast hooks were used to elevate the skin flaps anteriorly towards the ceiling.  Thin skin flaps were then created circumferentially by dissecting with the plasma blade between the breast tissue and the subcutaneous fat.  This dissection was carried circumferentially all the way to the chest wall.  Next the breast was removed from the pectoralis  muscle with the pectoralis fascia.  Once the breast was completely detached then it was oriented with a stitch on the lateral skin and sent to pathology for further evaluation.  I was able to identify 2 spots in the right axilla with radioactivity.  These 2 areas were removed sharply with the plasma blade.  Ex vivo counts on these 2 nodes were approximately 50.  No other hot or palpable nodes were identified in the right axilla.  These were sent as sentinel nodes numbers 1 and 2.  Hemostasis was achieved using the plasma blade.  At this point the operation was turned over to Dr. Marla Roe for the reconstruction.  The patient was tolerating the surgery well.  All needle sponge and instrument counts were correct.  PLAN OF CARE: Admit for overnight observation  PATIENT DISPOSITION:  PACU - hemodynamically stable.   Delay start of Pharmacological VTE agent (>24hrs) due to surgical blood loss or risk of bleeding: not applicable

## 2019-09-08 ENCOUNTER — Encounter (HOSPITAL_COMMUNITY): Payer: Self-pay | Admitting: General Surgery

## 2019-09-08 DIAGNOSIS — C50411 Malignant neoplasm of upper-outer quadrant of right female breast: Secondary | ICD-10-CM | POA: Diagnosis not present

## 2019-09-08 LAB — HIV ANTIBODY (ROUTINE TESTING W REFLEX): HIV Screen 4th Generation wRfx: NONREACTIVE

## 2019-09-08 LAB — NICOTINE/COTININE METABOLITES
Cotinine: 1.8 ng/mL
Nicotine: <1 ng/mL

## 2019-09-08 NOTE — Discharge Summary (Signed)
Physician Discharge Summary  Patient ID: Rachel Sutton MRN: [id number] DOB/AGE: 58-Jan-1962 58 y.o.  Admit date: 09/07/2019 Discharge date: 09/08/2019  Admission Diagnoses:  Discharge Diagnoses:  Active Problems:   Breast cancer [facility])   Discharged Condition: good  Hospital Course: Rachel Sutton is a 58 year old female who presented to the [facility] main operating room on 09/07/2019 for surgical intervention for right breast cancer.  She underwent right mastectomy with deep right axillary sentinel lymph node biopsy by Dr. Marlou Starks followed by right immediate breast reconstruction and placement of acellular dermal matrix and tissue expander by Dr. Marla Roe.   On exam this morning patient is doing really well.  She does not have any complaints.  Her pain was tolerable last night.  No fevers, chills, nausea, vomiting.  JP drain in place with serosanguineous output.  She has positive flatus but no BM yet.  She reports that she is ready to go home.  Consults: None  Significant Diagnostic Studies: None  Treatments: IV hydration, antibiotics: Ancef, surgery: see above and pain control with valium  Discharge Exam: Blood pressure 116/63, pulse 76, temperature 98 F (36.7 C), temperature source Oral, resp. rate 18, height _0  (1.626 m), weight 81 kg, SpO2 97 %. General appearance: alert, cooperative, no distress and resting in bed eating breakfast Head: Normocephalic, without obvious abnormality, atraumatic Resp: clear to auscultation bilaterally Cardio: regular rate and rhythm, S1, S2 normal, no murmur, click, rub or gallop Extremities: extremities normal, atraumatic, no cyanosis or edema Pulses: 2+ and symmetric Skin: Skin color, texture, turgor normal. No rashes or lesions Neurologic: Grossly normal Incision/Wound: Incision on right breast c/d/i. Dermabond in place. JP drain on right side, serosanguinous fluid  Disposition: Discharge disposition: 01-Home or Self  Care       Discharge Instructions    Call MD for:  difficulty breathing, headache or visual disturbances   Complete by: As directed    Call MD for:  extreme fatigue   Complete by: As directed    Call MD for:  hives   Complete by: As directed    Call MD for:  persistant dizziness or light-headedness   Complete by: As directed    Call MD for:  persistant nausea and vomiting   Complete by: As directed    Call MD for:  redness, tenderness, or signs of infection (pain, swelling, redness, odor or green/yellow discharge around incision site)   Complete by: As directed    Call MD for:  severe uncontrolled pain   Complete by: As directed    Call MD for:  temperature >100.4   Complete by: As directed    Diet - low sodium heart healthy   Complete by: As directed    Increase activity slowly   Complete by: As directed      Allergies as of 09/08/2019      Reactions   Bee Venom Anaphylaxis   Haloperidol Lactate Other (See Comments)   Convulsions    Meperidine Hcl Hives   Xifaxan [rifaximin] Diarrhea, Other (See Comments)   Constantly felt the need to use the bathroom      Medication List    TAKE these medications   acetaminophen 500 MG tablet Commonly known as: TYLENOL Take 1,000 mg by mouth every 6 (six) hours as needed for moderate pain or headache.   ALPRAZolam 0.5 MG tablet Commonly known as: Xanax Take 1 tablet (0.5 mg total) by mouth daily.   anastrozole 1 MG tablet Commonly known as: ARIMIDEX Take 1  tablet (1 mg total) by mouth daily. What changed: when to take this   CALCIUM 500 PO Take 1 tablet by mouth daily.   cephALEXin 500 MG capsule Commonly known as: KEFLEX Take 500 mg by mouth 2 (two) times daily.   Cimzia Prefilled 2 X 200 MG/ML Kit Generic drug: Certolizumab Pegol Inject 200 mg as directed every 14 (fourteen) days.   cyclobenzaprine 10 MG tablet Commonly known as: FLEXERIL Take 10 mg by mouth 3 (three) times daily as needed for muscle spasms.    diazepam 2 MG tablet Commonly known as: Valium Take 1 tablet (2 mg total) by mouth every 12 (twelve) hours as needed for anxiety.   diclofenac 75 MG EC tablet Commonly known as: VOLTAREN Take 75 mg by mouth 2 (two) times daily as needed for mild pain.   diclofenac sodium 1 % Gel Commonly known as: VOLTAREN Apply 1 application topically 3 (three) times daily as needed (pain).   EPINEPHrine 0.3 mg/0.3 mL Devi Commonly known as: EPI-PEN Inject 0.3 mg into the muscle as needed (bee stings).   fish oil-omega-3 fatty acids 1000 MG capsule Take 1 g by mouth every morning.   folic acid 1 MG tablet Commonly known as: FOLVITE Take 1 mg by mouth every evening.   hydrochlorothiazide 12.5 MG capsule Commonly known as: MICROZIDE TAKE 1 CAPSULE DAILY What changed:   how much to take  how to take this  when to take this  additional instructions   HYDROcodone-acetaminophen 5-325 MG tablet Commonly known as: NORCO/VICODIN Take 1 tablet by mouth every 6 (six) hours as needed for moderate pain.   methotrexate 2.5 MG tablet Commonly known as: RHEUMATREX Take 10 mg by mouth every Thursday.   metoprolol succinate 50 MG 24 hr tablet Commonly known as: TOPROL-XL TAKE 1 TABLET TWICE A DAY  WITH OR IMMEDIATELY        FOLLOWING A MEAL. What changed:   how much to take  how to take this  when to take this  additional instructions   multivitamin with minerals Tabs tablet Take 1 tablet by mouth every morning.   NIFEdipine 90 MG 24 hr tablet Commonly known as: PROCARDIA XL/NIFEDICAL-XL TAKE 1 TABLET DAILY   nitroGLYCERIN 0.4 MG SL tablet Commonly known as: NITROSTAT Place 1 tablet (0.4 mg total) under the tongue every 5 (five) minutes x 3 doses as needed for chest pain.   ondansetron 4 MG tablet Commonly known as: Zofran Take 1 tablet (4 mg total) by mouth every 8 (eight) hours as needed for nausea or vomiting.   pantoprazole 40 MG tablet Commonly known as: PROTONIX TAKE 1  TABLET DAILY What changed: when to take this      Follow-up Information    Dillingham, Loel Lofty, DO In 1 week.   Specialty: Plastic Surgery Contact information: [address] [place] [address] [phone]           Reasons to call provided.  Patient is stable for discharge and is ready to go home.  Follow-up in 1 week postop.  Patient knows to call with any questions or concerns prior to then.  Signed: Carola Rhine Shiela Bruns 09/08/2019, 8:16 AM

## 2019-09-08 NOTE — Progress Notes (Signed)
Patient discharged to home with instructions. 

## 2019-09-09 LAB — SURGICAL PATHOLOGY

## 2019-09-09 NOTE — Anesthesia Postprocedure Evaluation (Signed)
Anesthesia Post Note  Patient: Rachel Sutton  Procedure(s) Performed: RIGHT MASTECTOMY WITH SENTINEL LYMPH NODE BIOPSY (Right Breast) RIGHT BREAST RECONSTRUCTION WITH PLACEMENT OF TISSUE EXPANDER AND FLEX HD (ACELLULAR HYDRATED DERMIS) (Right Breast)     Patient location during evaluation: PACU Anesthesia Type: Regional and General Level of consciousness: awake and alert Pain management: pain level controlled Vital Signs Assessment: post-procedure vital signs reviewed and stable Respiratory status: spontaneous breathing, nonlabored ventilation, respiratory function stable and patient connected to nasal cannula oxygen Cardiovascular status: stable and blood pressure returned to baseline Postop Assessment: no apparent nausea or vomiting Anesthetic complications: no    Last Vitals:  Vitals:   09/08/19 0228 09/08/19 0453  BP: 115/64 116/63  Pulse: 83 76  Resp:  18  Temp: (!) 36.4 C 36.7 C  SpO2: 96% 97%    Last Pain:  Vitals:   09/08/19 1009  TempSrc:   PainSc: 2                  [name]

## 2019-09-10 ENCOUNTER — Telehealth: Payer: Self-pay | Admitting: *Deleted

## 2019-09-10 NOTE — Telephone Encounter (Signed)
Ordered oncotype per Dr. Burr Medico.  Requisition faxed to pathology.

## 2019-09-11 ENCOUNTER — Encounter: Payer: Self-pay | Admitting: *Deleted

## 2019-09-14 ENCOUNTER — Telehealth: Payer: Self-pay

## 2019-09-14 NOTE — Telephone Encounter (Signed)

## 2019-09-15 ENCOUNTER — Other Ambulatory Visit: Payer: Self-pay

## 2019-09-15 ENCOUNTER — Ambulatory Visit (INDEPENDENT_AMBULATORY_CARE_PROVIDER_SITE_OTHER): Payer: Commercial Managed Care - PPO | Admitting: Surgical

## 2019-09-15 ENCOUNTER — Encounter: Payer: Self-pay | Admitting: Surgical

## 2019-09-15 ENCOUNTER — Telehealth: Payer: Self-pay

## 2019-09-15 VITALS — BP 134/88 | HR 85 | Temp 97.7°F | Ht 63.0 in | Wt 174.0 lb

## 2019-09-15 DIAGNOSIS — C50411 Malignant neoplasm of upper-outer quadrant of right female breast: Secondary | ICD-10-CM

## 2019-09-15 DIAGNOSIS — Z170 Estrogen receptor positive status [ER+]: Secondary | ICD-10-CM

## 2019-09-15 NOTE — Progress Notes (Addendum)
   Subjective:     Patient ID: Rachel Sutton, female    DOB: November 22, 1961, 58 y.o.   MRN: [id number]  Chief Complaint  Patient presents with  . Follow-up    HPI: The patient is a 58 y.o. female here for follow-up after right immediate breast reconstruction with placement of acellular dermal matrix and tissue expander.  She is 1 week postop.  She reports that she has been doing well.  She is here with her husband today.  She has not been smoking  No fever, chills, nausea, vomiting.  Incisions are C/D/I and healing well.  Dermabond in place.  JP drain in place along the right lateral breast.  She had approximately 100 cc output yesterday, which was mostly serosanguineous.  Today on exam the fluid is serous in nature.  She reports she is having minimal pain but is having sharp electric-like pain along the right superior aspect of her right breast.  She reports that she will not have to undergo any chemotherapy or radiation at this time.  Review of Systems  Constitutional: Negative for chills, diaphoresis, fever, malaise/fatigue and weight loss.       + Activity change  Respiratory: Negative.   Cardiovascular: Negative.   Gastrointestinal: Negative.   Genitourinary: Negative.   Musculoskeletal: Positive for myalgias (Right breast pain).  Skin: Negative for itching and rash.  Neurological: Positive for sensory change. Negative for dizziness, tingling, seizures, weakness and headaches.     Objective:   Vital Signs BP 134/88 (BP Location: Left Arm, Patient Position: Sitting, Cuff Size: Large)   Pulse 85   Temp 97.7 F (36.5 C) (Temporal)   Ht 5\' 3"  (1.6 m)   Wt 174 lb (78.9 kg)   SpO2 98%   BMI 30.82 kg/m  Vital Signs and Nursing Note Reviewed  Physical Exam  Constitutional: She is oriented to person, place, and time and well-developed, well-nourished, and in no distress.  HENT:  Head: Normocephalic and atraumatic.  Cardiovascular: Normal rate.  Pulmonary/Chest: Effort  normal.    Musculoskeletal: Normal range of motion.  Neurological: She is alert and oriented to person, place, and time. Gait normal.  Skin: Skin is warm and dry. No rash noted. She is not diaphoretic. No erythema. No pallor.  Psychiatric: Mood and affect normal.      Assessment/Plan:     ICD-10-CM   1. Malignant neoplasm of upper-outer quadrant of right breast in female, estrogen receptor positive ([place])  C50.411    Z17.0    [name] is doing really well.  She reports that she is having some right breast pain, which is mild in nature with occasional sharp stabbing pains.  No fever, chills, nausea, vomiting.  No sign of infection.   The plan is to continue to fill right breast expander, size is limited due to previous radiation.  She understands that due to radiation it is possible that the incision does not heal well and latissimus flap may be necessary in the future.  Optimize nutritional status, high-protein low carbs/sugar.  Take multivitamin.  JP drain left in - follow up in 1 week for removal pending amount of fluid.  Avoid showering until removal of drain.  We placed injectable saline in the Expander using a sterile technique: Right: 50 cc for a total of 200 / 535 cc   Charlies Constable, PA-C 09/15/2019, 9:55 AM

## 2019-09-15 NOTE — Telephone Encounter (Signed)
Received a call from [facility] representative saying they had not received Oncotype sample and wanted to make sure the form wasn't incorrectly marked when it showed we would be sending them the sample.   Reached out to [employer] and [place] ([employer]) to see if they knew anything about the situation. They spoke with pathology and informed me that the patient's sample was sent out today so [employer] should have the sample in the next couple of days.   Called representative back [phone] with update. No other needs identified at this time.

## 2019-09-22 ENCOUNTER — Encounter: Payer: Self-pay | Admitting: Plastic Surgery

## 2019-09-22 ENCOUNTER — Other Ambulatory Visit: Payer: Self-pay

## 2019-09-22 ENCOUNTER — Ambulatory Visit (INDEPENDENT_AMBULATORY_CARE_PROVIDER_SITE_OTHER): Payer: Commercial Managed Care - PPO | Admitting: Plastic Surgery

## 2019-09-22 VITALS — BP 123/78 | HR 84 | Temp 97.5°F | Ht 63.0 in | Wt 177.8 lb

## 2019-09-22 DIAGNOSIS — C50411 Malignant neoplasm of upper-outer quadrant of right female breast: Secondary | ICD-10-CM

## 2019-09-22 DIAGNOSIS — Z170 Estrogen receptor positive status [ER+]: Secondary | ICD-10-CM

## 2019-09-22 NOTE — Progress Notes (Signed)
   Subjective:    Patient ID: Rachel Sutton, female    DOB: [birth year], 58 y.o.   MRN: [id number]  The patient is a 58 yrs old wf here for follow up on her right breast reconstruction.  She has a right breast expander.  She is pleased with her progress.  There is minimal drainage from the tube.  As I lightly massaged the breast there was moderate fluid removed.  We will wait to remove the drain.  The incision is healing nicely.  No redness or sign of infection.     Review of Systems  Constitutional: Negative for activity change.  Eyes: Negative.   Respiratory: Negative.  Negative for chest tightness.   Cardiovascular: Negative.   Gastrointestinal: Negative.   Genitourinary: Negative.   Musculoskeletal: Negative.        Objective:   Physical Exam Vitals signs and nursing note reviewed.  Constitutional:      Appearance: Normal appearance.  HENT:     Head: Normocephalic and atraumatic.  Cardiovascular:     Rate and Rhythm: Normal rate.  Pulmonary:     Effort: Pulmonary effort is normal.  Neurological:     General: No focal deficit present.     Mental Status: She is alert.  Psychiatric:        Mood and Affect: Mood normal.        Behavior: Behavior normal.        Thought Content: Thought content normal.        Assessment & Plan:     ICD-10-CM   1. Malignant neoplasm of upper-outer quadrant of right breast in female, estrogen receptor positive ([place])  C50.411    Z17.0     Drain not removed today.  Should be able to remove this Friday.  We placed injectable saline in the Expander using a sterile technique: Right: 50 cc for a total of 250 / 535 cc

## 2019-09-23 ENCOUNTER — Telehealth: Payer: Self-pay | Admitting: *Deleted

## 2019-09-23 NOTE — Telephone Encounter (Signed)
Received oncotype score of 17/5%. Physician team notified. Called pt with results and discussed does not need chemo. Pt still taking anastrozole. Scheduled and confirmed appt to see Dr. Burr Medico on 11/9 at 8am.

## 2019-09-28 ENCOUNTER — Ambulatory Visit (INDEPENDENT_AMBULATORY_CARE_PROVIDER_SITE_OTHER): Payer: Commercial Managed Care - PPO | Admitting: Nurse Practitioner

## 2019-09-28 ENCOUNTER — Other Ambulatory Visit: Payer: Self-pay

## 2019-09-28 ENCOUNTER — Encounter: Payer: Self-pay | Admitting: Nurse Practitioner

## 2019-09-28 ENCOUNTER — Other Ambulatory Visit: Payer: Self-pay | Admitting: Surgical

## 2019-09-28 VITALS — BP 133/84 | HR 84 | Temp 97.3°F | Ht 63.0 in | Wt 178.8 lb

## 2019-09-28 DIAGNOSIS — Z170 Estrogen receptor positive status [ER+]: Secondary | ICD-10-CM

## 2019-09-28 DIAGNOSIS — C50411 Malignant neoplasm of upper-outer quadrant of right female breast: Secondary | ICD-10-CM

## 2019-09-28 DIAGNOSIS — Z9889 Other specified postprocedural states: Secondary | ICD-10-CM

## 2019-09-28 NOTE — Progress Notes (Signed)
Patient ID: Rachel Sutton, female    DOB: [birth year], 57 y.o.   MRN: [id number]   Chief Complaint  Patient presents with  . Follow-up    1 week for (R) breast reconstruction   Rachel Sutton is a 58 yo female who presents for follow up on her right breast reconstruction on 09/07/19. Patient's tissue expander was filled last week. Patient still has her JP drain in place. Drain output on 10/6=35 ml, 10/7=40 ml, 10/8=40 ml, 10/9=30 ml, 10/10=20 ml, 10/11=15 ml. Patient states she has been massaging the right breast "some" to increase drainage.   Review of Systems  Constitutional: Positive for activity change.  HENT: Negative.   Respiratory: Negative.   Cardiovascular: Negative.   Gastrointestinal: Negative.   Genitourinary: Negative.   Musculoskeletal: Negative.   Skin:       Incisions and drain site  Neurological: Positive for numbness.       Partial numbness at right breast    Past Medical History:  Diagnosis Date  . Anxiety   . Arthritis   . Breast cancer ([place]) 02/22/2012   Right T1c, N0  . C. difficile colitis   . Colitis, collagenous 09/22/2015  . Depression   . Essential hypertension, benign   . Family history of brain cancer   . Family history of esophageal cancer   . Family history of lung cancer   . Family history of lymphoma   . Family history of pancreatic cancer   . Fuchs' corneal dystrophy   . GERD (gastroesophageal reflux disease)   . Hypertension   . Mitral valve prolapse    Mild mitral regurgitation  . Mixed hyperlipidemia   . Seizures ([place])    Only with dose of haldol; no more since that was stopped.    Past Surgical History:  Procedure Laterality Date  . ABDOMINAL HYSTERECTOMY  1999  . BACK SURGERY    . BIOPSY N/A 09/28/2015   Procedure: BIOPSY;  Surgeon: Danie Binder, MD;  Location: [facility];  Service: Endoscopy;  Laterality: N/A;  . BIOPSY  04/05/2016   Procedure: BIOPSY;  Surgeon: Danie Binder, MD;  Location: [facility];  Service:  Endoscopy;;  colon bx  . BREAST LUMPECTOMY  10/30/2002   right  . BREAST RECONSTRUCTION WITH PLACEMENT OF TISSUE EXPANDER AND FLEX HD (ACELLULAR HYDRATED DERMIS) Right 09/07/2019   Procedure: RIGHT BREAST RECONSTRUCTION WITH PLACEMENT OF TISSUE EXPANDER AND FLEX HD (ACELLULAR HYDRATED DERMIS);  Surgeon: Wallace Going, DO;  Location: [place];  Service: Plastics;  Laterality: Right;  . CARDIAC CATHETERIZATION    . Carpal tunnel R/L  1992  . Cervical spine fusion X2 since last visit    . COLONOSCOPY WITH PROPOFOL N/A 09/28/2015   Procedure: COLONOSCOPY WITH PROPOFOL;  Surgeon: Danie Binder, MD;  Location: [facility];  Service: Endoscopy;  Laterality: N/A;  procedure 1 cecum time in 1232  time out 1246   total time 14 mintes  . COLONOSCOPY WITH PROPOFOL N/A 04/05/2016   Procedure: COLONOSCOPY WITH PROPOFOL;  Surgeon: Danie Binder, MD;  [facility];  Service: Endoscopy;  Laterality: N/A;  1200  . 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 ELBOW SURGERY Right  tennis elbow release 2012  . ESOPHAGOGASTRODUODENOSCOPY  10/26/2012   Procedure: ESOPHAGOGASTRODUODENOSCOPY (EGD);  Surgeon: Inda Castle, MD;  Location: [place];  Service: Endoscopy;  Laterality: N/A;  . ESOPHAGOGASTRODUODENOSCOPY (EGD) WITH PROPOFOL N/A 09/28/2015   Procedure: ESOPHAGOGASTRODUODENOSCOPY (EGD)  WITH PROPOFOL;  Surgeon: Danie Binder, MD;  Location: [facility];  Service: Endoscopy;  Laterality: N/A;  . EYE SURGERY  2018   corneal transplants bilateral  . KNEE ARTHROSCOPY Left   . LEFT HEART CATHETERIZATION WITH CORONARY ANGIOGRAM N/A 10/23/2012   Procedure: LEFT HEART CATHETERIZATION WITH CORONARY ANGIOGRAM;  Surgeon: Peter M Martinique, MD;  Location: [facility];  Service: Cardiovascular;  Laterality: N/A;  . MASTECTOMY W/ SENTINEL NODE BIOPSY Right 09/07/2019   Procedure: RIGHT MASTECTOMY WITH SENTINEL LYMPH NODE BIOPSY;  Surgeon: Jovita Kussmaul, MD;  Location: [place];  Service: General;  Laterality: Right;   . OVARIAN CYST REMOVAL     prior to hysterectomy, right  . Ray cage fusion  1997  . SENTINEL LYMPH NODE BIOPSY  10/30/2002   right  . TONSILLECTOMY     at six months old      Current Outpatient Medications:  .  acetaminophen (TYLENOL) 500 MG tablet, Take 1,000 mg by mouth every 6 (six) hours as needed for moderate pain or headache., Disp: , Rfl:  .  ALPRAZolam (XANAX) 0.5 MG tablet, Take 1 tablet (0.5 mg total) by mouth daily., Disp: 90 tablet, Rfl: 2 .  anastrozole (ARIMIDEX) 1 MG tablet, Take 1 tablet (1 mg total) by mouth daily. (Patient taking differently: Take 1 mg by mouth every evening. ), Disp: 30 tablet, Rfl: 2 .  Calcium-Magnesium-Vitamin D (CALCIUM 500 PO), Take 1 tablet by mouth daily. , Disp: , Rfl:  .  CIMZIA PREFILLED 2 X 200 MG/ML KIT, Inject 200 mg as directed every 14 (fourteen) days., Disp: , Rfl:  .  cyclobenzaprine (FLEXERIL) 10 MG tablet, Take 10 mg by mouth 3 (three) times daily as needed for muscle spasms., Disp: , Rfl:  .  diclofenac (VOLTAREN) 75 MG EC tablet, Take 75 mg by mouth 2 (two) times daily as needed for mild pain., Disp: , Rfl:  .  diclofenac sodium (VOLTAREN) 1 % GEL, Apply 1 application topically 3 (three) times daily as needed (pain). , Disp: , Rfl:  .  EPINEPHrine (EPI-PEN) 0.3 mg/0.3 mL DEVI, Inject 0.3 mg into the muscle as needed (bee stings). , Disp: , Rfl:  .  fish oil-omega-3 fatty acids 1000 MG capsule, Take 1 g by mouth every morning. , Disp: , Rfl:  .  folic acid (FOLVITE) 1 MG tablet, Take 1 mg by mouth every evening., Disp: , Rfl:  .  hydrochlorothiazide (MICROZIDE) 12.5 MG capsule, TAKE 1 CAPSULE DAILY (Patient taking differently: Take 12.5 mg by mouth daily. ), Disp: 90 capsule, Rfl: 3 .  methotrexate (RHEUMATREX) 2.5 MG tablet, Take 10 mg by mouth every Thursday. , Disp: , Rfl:  .  metoprolol succinate (TOPROL-XL) 50 MG 24 hr tablet, TAKE 1 TABLET TWICE A DAY  WITH OR IMMEDIATELY        FOLLOWING A MEAL. (Patient taking differently: Take  50 mg by mouth 2 (two) times daily. ), Disp: 180 tablet, Rfl: 3 .  Multiple Vitamin (MULITIVITAMIN WITH MINERALS) TABS, Take 1 tablet by mouth every morning., Disp: , Rfl:  .  NIFEdipine (PROCARDIA XL/NIFEDICAL-XL) 90 MG 24 hr tablet, TAKE 1 TABLET DAILY (Patient taking differently: Take 90 mg by mouth daily. ), Disp: 90 tablet, Rfl: 3 .  nitroGLYCERIN (NITROSTAT) 0.4 MG SL tablet, Place 1 tablet (0.4 mg total) under the tongue every 5 (five) minutes x 3 doses as needed for chest pain., Disp: 25 tablet, Rfl: 3 .  ondansetron (ZOFRAN) 4 MG tablet, Take  1 tablet (4 mg total) by mouth every 8 (eight) hours as needed for nausea or vomiting., Disp: 20 tablet, Rfl: 0 .  pantoprazole (PROTONIX) 40 MG tablet, TAKE 1 TABLET DAILY (Patient taking differently: Take 40 mg by mouth every evening. ), Disp: 30 tablet, Rfl: 6 .  diazepam (VALIUM) 2 MG tablet, Take 1 tablet (2 mg total) by mouth every 12 (twelve) hours as needed for anxiety. (Patient not taking: Reported on 09/28/2019), Disp: 20 tablet, Rfl: 0 .  HYDROcodone-acetaminophen (NORCO/VICODIN) 5-325 MG tablet, Take 1 tablet by mouth every 6 (six) hours as needed for moderate pain., Disp: , Rfl:    Objective:   Vitals:   09/28/19 0821  BP: 133/84  Pulse: 84  Temp: (!) 97.3 F (36.3 C)  SpO2: 94%    Physical Exam  General: alert, calm, no acute distress HEENT: normocephalic Neck: supple, full ROM Chest: symmetrical rise and fall, JP drain sutured inferior to right axilla with 15 ml serous drainage in charged bulb Lungs: unlabored breathing Breast: right breast mastectomy with reconstruction, incision clean, dry, intact; mild tenderness Cardiac: +2 bilateral radial pulse, cap refill <3 sec Abdomen: soft, non-distended Musculoskeletal: MAEx4 Neuro: A&O x3, calm, cooperative, steady gait Skin: warm, dry   Assessment & Plan:  Malignant neoplasm of upper-outer quadrant of right breast in female, estrogen receptor positive ([place])  Status post  right breast reconstruction  Penelope Fittro is a 15 to female POD #21 s/p right breast reconstruction with tissue expander placement. She has had minimal drain output for the past 3 days. The right breast was gently massaged to express any additional fluid. No additional fluid was expressed. The drain was removed today. Patient may shower this afternoon. Return in 1 week for follow up and expander fill.   We did NOT fill the expander today. Right: total of 250 / 535 cc   Jamiah Recore Dozier-Lineberger, NP

## 2019-09-29 ENCOUNTER — Other Ambulatory Visit: Payer: Self-pay | Admitting: Hematology

## 2019-09-30 ENCOUNTER — Other Ambulatory Visit: Payer: Self-pay | Admitting: Cardiology

## 2019-09-30 NOTE — Telephone Encounter (Signed)
Prescription approved for valium 2mg  for muscle spasms post-expander fill. Sent to pharmacy via e-scribe.

## 2019-10-05 ENCOUNTER — Telehealth: Payer: Self-pay

## 2019-10-05 NOTE — Telephone Encounter (Signed)

## 2019-10-06 ENCOUNTER — Encounter: Payer: Self-pay | Admitting: Surgical

## 2019-10-06 ENCOUNTER — Ambulatory Visit (INDEPENDENT_AMBULATORY_CARE_PROVIDER_SITE_OTHER): Payer: Commercial Managed Care - PPO | Admitting: Surgical

## 2019-10-06 ENCOUNTER — Other Ambulatory Visit: Payer: Self-pay

## 2019-10-06 VITALS — BP 138/87 | HR 94 | Temp 97.5°F | Ht 63.0 in | Wt 175.6 lb

## 2019-10-06 DIAGNOSIS — Z720 Tobacco use: Secondary | ICD-10-CM

## 2019-10-06 DIAGNOSIS — Z9889 Other specified postprocedural states: Secondary | ICD-10-CM

## 2019-10-06 DIAGNOSIS — Z170 Estrogen receptor positive status [ER+]: Secondary | ICD-10-CM

## 2019-10-06 DIAGNOSIS — C50411 Malignant neoplasm of upper-outer quadrant of right female breast: Secondary | ICD-10-CM

## 2019-10-06 NOTE — Progress Notes (Signed)
   Subjective:     Patient ID: Rachel Sutton, female    DOB: 1961/01/24, 58 y.o.   MRN: [id number]  Chief Complaint  Patient presents with  . Follow-up    2 weeks on (R) breast reconstruction    HPI: The patient is a 58 y.o. female here for follow-up after right breast reconstruction on 09/07/19.  She reports that she is doing well overall, but has been having skin sensitivity, drainage on her right bra, and some swelling of right breast.  On exam, a fluid wave is evident laterally in the axilla. She did have some axillary dissection, so it is possibly lymph. Two small scabbing wounds noted, no open wounds at this time. No erythema, drainage or overt swelling noted. No sign of infection. She has a hx of radiation.  No fever, chills, n/v. No other complaints.  Review of Systems  Constitutional: Negative for chills, diaphoresis, fever and malaise/fatigue.  Respiratory: Negative for cough, shortness of breath and wheezing.   Cardiovascular: Negative for chest pain, palpitations, leg swelling and PND.  Gastrointestinal: Negative for abdominal pain, diarrhea, nausea and vomiting.  Musculoskeletal: Positive for myalgias. Negative for back pain and neck pain.  Skin: Negative for itching and rash.       Sensation change   Neurological: Negative for dizziness, weakness and headaches.     Objective:   Vital Signs BP 138/87 (BP Location: Left Arm, Patient Position: Sitting, Cuff Size: Normal)   Pulse 94   Temp (!) 97.5 F (36.4 C) (Temporal)   Ht 5\' 3"  (1.6 m)   Wt 175 lb 9.6 oz (79.7 kg)   SpO2 95%   BMI 31.11 kg/m  Vital Signs and Nursing Note Reviewed Chaperone present Physical Exam  Constitutional: She is oriented to person, place, and time and well-developed, well-nourished, and in no distress.  HENT:  Head: Normocephalic and atraumatic.  Cardiovascular: Normal rate.  Pulmonary/Chest: Effort normal.    Musculoskeletal: Normal range of motion.  Neurological: She is  alert and oriented to person, place, and time. Gait normal.  Skin: Skin is warm and dry. No rash noted. She is not diaphoretic. No erythema. No pallor.  Psychiatric: Mood and affect normal.     Assessment/Plan:     ICD-10-CM   1. Malignant neoplasm of upper-outer quadrant of right breast in female, estrogen receptor positive ([place])  C50.411    Z17.0   2. Status post right breast reconstruction  Z98.890   3. Tobacco abuse  Z72.0    Rachel Sutton is doing well overall. She does have a hx of right breast radiation which can cause complications at any time due to skin/vessel damage. At this time, there is no concern, but we will be conservative with expander fills as we do not want a wound to develop or for her to dehisce the right breast incisions.  No sign of infection at this time.  100 cc of straw colored fluid aspirated in a sterile manner. Fluid wave no longer evident.  We placed injectable saline in the Expander using a sterile technique: Right: 20 cc for atotal of 270/ 535cc  F/u in 2 weeks. We are nearing maximum for expander due to radiation and risk of dehiscence. She is comfortable with current size, will re-evaluate in 2 week for possible fill.   Carola Rhine Sylvio Weatherall, PA-C 10/06/2019, 11:19 AM

## 2019-10-19 ENCOUNTER — Telehealth: Payer: Self-pay | Admitting: Surgical

## 2019-10-19 NOTE — Telephone Encounter (Signed)

## 2019-10-20 ENCOUNTER — Other Ambulatory Visit: Payer: Self-pay

## 2019-10-20 ENCOUNTER — Encounter: Payer: Self-pay | Admitting: Surgical

## 2019-10-20 ENCOUNTER — Encounter: Payer: Self-pay | Admitting: Plastic Surgery

## 2019-10-20 ENCOUNTER — Ambulatory Visit (INDEPENDENT_AMBULATORY_CARE_PROVIDER_SITE_OTHER): Payer: Commercial Managed Care - PPO | Admitting: Surgical

## 2019-10-20 VITALS — BP 127/80 | HR 83 | Temp 96.9°F | Ht 63.0 in | Wt 177.2 lb

## 2019-10-20 DIAGNOSIS — Z9889 Other specified postprocedural states: Secondary | ICD-10-CM

## 2019-10-20 DIAGNOSIS — Z720 Tobacco use: Secondary | ICD-10-CM

## 2019-10-20 DIAGNOSIS — Z170 Estrogen receptor positive status [ER+]: Secondary | ICD-10-CM

## 2019-10-20 DIAGNOSIS — C50411 Malignant neoplasm of upper-outer quadrant of right female breast: Secondary | ICD-10-CM

## 2019-10-20 NOTE — Progress Notes (Addendum)
   Subjective:     Patient ID: Rachel Sutton, female    DOB: [birth year], 58 y.o.   MRN: [id number]  Chief Complaint  Patient presents with  . Follow-up    4 weeks    HPI: The patient is a 58 y.o. female here for follow-up after right breast reconstruction on 09/07/19.  Rachel Sutton was last seen on 10/06/2019.  Today she reports she is feeling well overall.  She has noticed fluid accumulation in the right breast axillary region, it has been gradual.  On exam there is a positive fluid wave.  Her incisions are healing well, C/D/I.  Dermabond flaking.  There is no erythema, drainage, wounds at this time.  She has a small circular lesion at her right medial inframammary fold. No surrounding erythema, swelling noted. No open wound. She reports she gets these occasionally.   No fever, chills, nausea, vomiting.  Review of Systems  Constitutional: Positive for activity change. Negative for appetite change, chills, diaphoresis, fatigue and fever.  Respiratory: Negative.   Cardiovascular: Negative.   Musculoskeletal: Positive for myalgias.  Skin: Positive for rash. Negative for color change and wound.  Neurological: Negative for dizziness.     Objective:   Vital Signs BP 127/80 (BP Location: Left Arm, Patient Position: Sitting, Cuff Size: Normal)   Pulse 83   Temp (!) 96.9 F (36.1 C) (Temporal)   Ht 5\' 3"  (1.6 m)   Wt 177 lb 3.2 oz (80.4 kg)   SpO2 97%   BMI 31.39 kg/m  Vital Signs and Nursing Note Reviewed Chaperone present Physical Exam  Constitutional: She is oriented to person, place, and time and well-developed, well-nourished, and in no distress. No distress.  HENT:  Head: Normocephalic and atraumatic.  Cardiovascular: Normal rate.  Pulmonary/Chest: Effort normal.    Musculoskeletal: Normal range of motion.  Neurological: She is alert and oriented to person, place, and time. Gait normal.  Skin: Skin is warm and dry. No rash noted. She is not diaphoretic. No erythema.  No pallor.  Psychiatric: Mood and affect normal.    Assessment/Plan:     ICD-10-CM   1. Malignant neoplasm of upper-outer quadrant of right breast in female, estrogen receptor positive ([place])  C50.411    Z17.0   2. Status post right breast reconstruction  Z98.890   3. Tobacco abuse  Z72.0     Rachel Sutton is healing well.  Incisions are all C/D/I, no sign of infection, dehiscence, erythema.  250 cc of straw-colored fluid aspirated in a sterile manner. Dr. Marla Roe assisted me with this.  Careful manner was taken to not puncture the expander.  She is going to follow-up in 1 week for evaluation, possible fill.  Patient reports she does not want anything to be prescribed for her left breast rash/lesion.  We placed injectable saline in the Expander using a sterile technique: Right: 50 cc for atotal of 320/ 535cc  Carola Rhine Jalisia Puchalski, PA-C 10/20/2019, 8:24 AM

## 2019-10-22 NOTE — Progress Notes (Signed)
Patient ID: Rachel Sutton, female    DOB: 10/27/1961, 58 y.o.   MRN: [id number]   C.C.: post-op check  Rachel Sutton is a 58 yo female who presents for follow up after right breast reconstruction with tissue expander and flex HD placement on 09/07/19. She has had fluid accumulation after JP drain removal. She had 250 ml of straw-colored fluid aspirated from the right axilla area last week. She believes there is more fluid that needs to be removed today. Patient states she is "a little uncomfortable, but not too bad."   Review of Systems  Constitutional: Positive for activity change.  HENT: Negative.   Respiratory: Negative.   Cardiovascular: Negative.   Gastrointestinal: Negative.   Genitourinary: Negative.   Musculoskeletal: Negative.   Skin: Negative.   Neurological: Negative.     Past Medical History:  Diagnosis Date  . Anxiety   . Arthritis   . Breast cancer ([place]) 02/22/2012   Right T1c, N0  . C. difficile colitis   . Colitis, collagenous 09/22/2015  . Depression   . Essential hypertension, benign   . Family history of brain cancer   . Family history of esophageal cancer   . Family history of lung cancer   . Family history of lymphoma   . Family history of pancreatic cancer   . Fuchs' corneal dystrophy   . GERD (gastroesophageal reflux disease)   . Hypertension   . Mitral valve prolapse    Mild mitral regurgitation  . Mixed hyperlipidemia   . Seizures ([place])    Only with dose of haldol; no more since that was stopped.    Past Surgical History:  Procedure Laterality Date  . ABDOMINAL HYSTERECTOMY  1999  . BACK SURGERY    . BIOPSY N/A 09/28/2015   Procedure: BIOPSY;  Surgeon: Danie Binder, MD;  Location: [facility];  Service: Endoscopy;  Laterality: N/A;  . BIOPSY  04/05/2016   Procedure: BIOPSY;  Surgeon: Danie Binder, MD;  Location: [facility];  Service: Endoscopy;;  colon bx  . BREAST LUMPECTOMY  10/30/2002   right  . BREAST RECONSTRUCTION WITH  PLACEMENT OF TISSUE EXPANDER AND FLEX HD (ACELLULAR HYDRATED DERMIS) Right 09/07/2019   Procedure: RIGHT BREAST RECONSTRUCTION WITH PLACEMENT OF TISSUE EXPANDER AND FLEX HD (ACELLULAR HYDRATED DERMIS);  Surgeon: Wallace Going, DO;  Location: [place];  Service: Plastics;  Laterality: Right;  . CARDIAC CATHETERIZATION    . Carpal tunnel R/L  1992  . Cervical spine fusion X2 since last visit    . COLONOSCOPY WITH PROPOFOL N/A 09/28/2015   Procedure: COLONOSCOPY WITH PROPOFOL;  Surgeon: Danie Binder, MD;  Location: [facility];  Service: Endoscopy;  Laterality: N/A;  procedure 1 cecum time in 1232  time out 1246   total time 14 mintes  . COLONOSCOPY WITH PROPOFOL N/A 04/05/2016   Procedure: COLONOSCOPY WITH PROPOFOL;  Surgeon: Danie Binder, MD;  [facility]: [facility];  Service: Endoscopy;  Laterality: N/A;  1200  . 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 ELBOW SURGERY Right  tennis elbow release 2012  . ESOPHAGOGASTRODUODENOSCOPY  10/26/2012   Procedure: ESOPHAGOGASTRODUODENOSCOPY (EGD);  Surgeon: Inda Castle, MD;  Location: [place];  Service: Endoscopy;  Laterality: N/A;  . ESOPHAGOGASTRODUODENOSCOPY (EGD) WITH PROPOFOL N/A 09/28/2015   Procedure: ESOPHAGOGASTRODUODENOSCOPY (EGD) WITH PROPOFOL;  Surgeon: Danie Binder, MD;  Location: [facility];  Service: Endoscopy;  Laterality: N/A;  . EYE SURGERY  2018  corneal transplants bilateral  . KNEE ARTHROSCOPY Left   . LEFT HEART CATHETERIZATION WITH CORONARY ANGIOGRAM N/A 10/23/2012   Procedure: LEFT HEART CATHETERIZATION WITH CORONARY ANGIOGRAM;  Surgeon: Peter M Martinique, MD;  Location: [facility] CATH LAB;  Service: Cardiovascular;  Laterality: N/A;  . MASTECTOMY W/ SENTINEL NODE BIOPSY Right 09/07/2019   Procedure: RIGHT MASTECTOMY WITH SENTINEL LYMPH NODE BIOPSY;  Surgeon: Jovita Kussmaul, MD;  Location: [place];  Service: General;  Laterality: Right;  . OVARIAN CYST REMOVAL     prior to hysterectomy, right  . Ray cage fusion  1997  . SENTINEL  LYMPH NODE BIOPSY  10/30/2002   right  . TONSILLECTOMY     at six months old      Current Outpatient Medications:  .  acetaminophen (TYLENOL) 500 MG tablet, Take 1,000 mg by mouth every 6 (six) hours as needed for moderate pain or headache., Disp: , Rfl:  .  ALPRAZolam (XANAX) 0.5 MG tablet, Take 1 tablet (0.5 mg total) by mouth daily., Disp: 90 tablet, Rfl: 2 .  anastrozole (ARIMIDEX) 1 MG tablet, TAKE 1 TABLET(1 MG) BY MOUTH DAILY, Disp: 30 tablet, Rfl: 2 .  Calcium-Magnesium-Vitamin D (CALCIUM 500 PO), Take 1 tablet by mouth daily. , Disp: , Rfl:  .  CIMZIA PREFILLED 2 X 200 MG/ML KIT, Inject 200 mg as directed every 14 (fourteen) days., Disp: , Rfl:  .  cyclobenzaprine (FLEXERIL) 10 MG tablet, Take 10 mg by mouth 3 (three) times daily as needed for muscle spasms., Disp: , Rfl:  .  diazepam (VALIUM) 2 MG tablet, Take 1 tablet (2 mg total) by mouth every 12 (twelve) hours as needed for muscle spasms., Disp: 20 tablet, Rfl: 0 .  diclofenac (VOLTAREN) 75 MG EC tablet, Take 75 mg by mouth 2 (two) times daily as needed for mild pain., Disp: , Rfl:  .  diclofenac sodium (VOLTAREN) 1 % GEL, Apply 1 application topically 3 (three) times daily as needed (pain). , Disp: , Rfl:  .  EPINEPHrine (EPI-PEN) 0.3 mg/0.3 mL DEVI, Inject 0.3 mg into the muscle as needed (bee stings). , Disp: , Rfl:  .  fish oil-omega-3 fatty acids 1000 MG capsule, Take 1 g by mouth every morning. , Disp: , Rfl:  .  folic acid (FOLVITE) 1 MG tablet, Take 1 mg by mouth every evening., Disp: , Rfl:  .  hydrochlorothiazide (MICROZIDE) 12.5 MG capsule, TAKE 1 CAPSULE DAILY (Patient taking differently: Take 12.5 mg by mouth daily. ), Disp: 90 capsule, Rfl: 3 .  HYDROcodone-acetaminophen (NORCO/VICODIN) 5-325 MG tablet, Take 1 tablet by mouth every 6 (six) hours as needed for moderate pain., Disp: , Rfl:  .  methotrexate (RHEUMATREX) 2.5 MG tablet, Take 10 mg by mouth every Thursday. , Disp: , Rfl:  .  metoprolol succinate  (TOPROL-XL) 50 MG 24 hr tablet, TAKE 1 TABLET TWICE A DAY  WITH OR IMMEDIATELY        FOLLOWING A MEAL. (Patient taking differently: Take 50 mg by mouth 2 (two) times daily. ), Disp: 180 tablet, Rfl: 3 .  Multiple Vitamin (MULITIVITAMIN WITH MINERALS) TABS, Take 1 tablet by mouth every morning., Disp: , Rfl:  .  NIFEdipine (PROCARDIA XL/NIFEDICAL-XL) 90 MG 24 hr tablet, TAKE 1 TABLET DAILY (Patient taking differently: Take 90 mg by mouth daily. ), Disp: 90 tablet, Rfl: 3 .  nitroGLYCERIN (NITROSTAT) 0.4 MG SL tablet, Place 1 tablet (0.4 mg total) under the tongue every 5 (five) minutes x 3 doses as needed for  chest pain., Disp: 25 tablet, Rfl: 3 .  ondansetron (ZOFRAN) 4 MG tablet, Take 1 tablet (4 mg total) by mouth every 8 (eight) hours as needed for nausea or vomiting., Disp: 20 tablet, Rfl: 0 .  pantoprazole (PROTONIX) 40 MG tablet, TAKE 1 TABLET DAILY, Disp: 30 tablet, Rfl: 6   Objective:   Vitals:   10/26/19 1120  BP: 127/80  Pulse: 88  Temp: (!) 97.3 F (36.3 C)  SpO2: 98%    Physical Exam  General: alert, calm, no acute distress HEENT: normocephalic Neck: supple, full ROM Lungs: unlabored breathing Breast: right mastectomy with positive fluid wave from right axilla to right upper breast area, left breast with moderate ptosis Cardiac: +2 bilateral radial pulse Abdomen: soft, non-distended Musculoskeletal: MAEx4 Neuro: A&O x3, calm, cooperative, steady gait Skin: warm, dry, intact  Assessment & Plan:  Status post right breast reconstruction  Malignant neoplasm of upper-outer quadrant of right breast in female, estrogen receptor positive ([facility])  Rachel Sutton is a 58 yo female s/p right breast reconstruction with expander placement. She had an obvious fluid wave within the right breast. With extreme caution to the tissue expander, a needle was placed in the right axillary region and 90 ml of serous fluid was aspirated with sterile technique. No resistance or structure was  felt during insertion of the needle.   We did NOT inject into the expander today. Right: total of 320 / 535 cc  Return in 1 week.    Alfredo Batty, NP

## 2019-10-23 NOTE — Progress Notes (Addendum)
Rachel Sutton   Telephone:([phone]) [phone] [phone]   Clinic Follow up Note   Patient Care Team: Janie Morning, DO as PCP - General (Family Medicine) Satira Sark, MD as PCP - Cardiology (Cardiology) Lendon Colonel, NP as Nurse Practitioner (Nurse Practitioner) Mauro Kaufmann, RN as Oncology Nurse Navigator Rockwell Germany, RN as Oncology Nurse Navigator Truitt Merle, MD as Consulting Physician (Hematology) Jovita Kussmaul, MD as Consulting Physician (General Surgery) Gery Pray, MD as Consulting Physician (Radiation Oncology) Lahoma Rocker, MD as Consulting Physician (Rheumatology)  Date of Service:  10/26/2019  CHIEF COMPLAINT: F/u of right breast cancer  SUMMARY OF ONCOLOGIC HISTORY: Oncology History  Breast cancer ([place])  10/16/2002 Initial Biopsy   10/16/2002   BREAST, RIGHT, 4 O'CLOCK, NEEDLE BIOPSIES: INVASIVE AND   INTRADUCTAL CARCINOMA.  Results   IMMUNOHISTOCHEMICAL AND MORPHOMETRIC ANALYSIS BY IMAGE CYTOMETRY   (The tumor is immunocytochemically stained for estrogen and   progesterone receptors and Mib-1, and quantitated   morphometrically.)   Estrogen Receptor (Negative, <1%): NEGATIVE   Progesterone Receptor (Negative, <1%): NEGATIVE   Proliferation Marker Ki67 by MIB-1 (Low <30%): 47%  HERCEPTEST   Her 2 neu by immunocytochemical stain (Negative): 3+;   POSITIVE    10/30/2002 Surgery   Surgery 10/30/02 FINAL DIAGNOSIS    1. SENTINEL LYMPH NODE BIOPSY, RIGHT AXILLARY NODE: ONE LYMPH   NODE, NEGATIVE FOR METASTATIC CARCINOMA (0/1), SEE COMMENT.    2. LUMPECTOMY, RIGHT BREAST:   - INVASIVE MAMMARY CARCINOMA, NO SPECIAL TYPE (DUCTAL), 1.3   CM, MSBR GRADE 3 OF 3.   - DUCTAL CARCINOMA IN SITU, HIGH-GRADE.   - EXTENSIVE INTRADUCTAL COMPONENT NEGATIVE (JCRT).   - NO LYMPHOVASCULAR INVASION IDENTIFIED.   - MARGINS FREE OF TUMOR.   - SEE COMMENT AND ONCOLOGY TABLE.    2003 - 07/2003 Radiation Therapy   Underwent adjuvant  radiation to 50.4 gray with an additional 10 gray boost to the surgical cavity completed 07/19/2003     - 02/2004 Chemotherapy   AC for 4 cycles and decetaxel and Herceptin for 4 cycles and then continued herceptin for totla 1 year treatment.      02/22/2012 Initial Diagnosis   Breast cancer ([place])   Malignant neoplasm of upper-outer quadrant of right breast in female, estrogen receptor positive ([place])  07/01/2019 Mammogram   Diagnostic mammogram 07/01/19  IMPRESSION: Suspicious mass in the 9:30 region of the right breast 9 cm from the nipple measuring 1.3 x 1.3 x 1.1 cm. Suspicious mass in the right axilla measuring 5 x 4 x 4 mm.   07/02/2019 Cancer Staging   Staging form: Breast, AJCC 8th Edition - Clinical stage from 07/02/2019: Stage IA (cT1c, cN0, cM0, G2, ER+, PR+, HER2: Equivocal) - Signed by Truitt Merle, MD on 07/07/2019   07/02/2019 Initial Biopsy   Diagnosis 07/02/19 1. Breast, right, needle core biopsy, 9:30 o'clock - INVASIVE DUCTAL CARCINOMA, GRADE 1-2. SEE NOTE - DUCTAL CARCINOMA IN SITU, INTERMEDIATE GRADE 2. Lymph node, needle/core biopsy, right axilla - LYMPH NODE, NEGATIVE FOR CARCINOMA   07/02/2019 Receptors her2   By immunohistochemistry, the tumor cells are EQUIVOCAL for Her2 (2+). HER2 by [place] will be PERFORMED and the RESULTS REPORTED SEPARATELY Estrogen Receptor: 100%, POSITIVE, STRONG STAINING INTENSITY Progesterone Receptor: 90%, POSITIVE, STRONG STAINING INTENSITY Proliferation Marker Ki67: 10%   07/06/2019 Initial Diagnosis   Malignant neoplasm of upper-outer quadrant of right breast in female, estrogen receptor positive ([place])   07/08/2019 -  Anti-estrogen oral therapy  Anastrozole 13m once daily starting 07/08/19 before surgery.    07/20/2019 Genetic Testing   Negative genetic testing on the [facility] Multi-Cancer Panel. The report date is 07/20/2019.  The Multi-Gene Panel offered by [facility] includes sequencing and/or deletion duplication testing of the following  85 genes: AIP, ALK, APC, ATM, AXIN2,BAP1,  BARD1, BLM, BMPR1A, BRCA1, BRCA2, BRIP1, CASR, CDC73, CDH1, CDK4, CDKN1B, CDKN1C, CDKN2A (p14ARF), CDKN2A (p16INK4a), CEBPA, CHEK2, CTNNA1, DICER1, DIS3L2, EGFR (c.2369C>T, p.Thr790Met variant only), EPCAM (Deletion/duplication testing only), FH, FLCN, GATA2, GPC3, GREM1 (Promoter region deletion/duplication testing only), HOXB13 (c.251G>A, p.Gly84Glu), HRAS, KIT, MAX, MEN1, MET, MITF (c.952G>A, p.Glu318Lys variant only), MLH1, MSH2, MSH3, MSH6, MUTYH, NBN, NF1, NF2, NTHL1, PALB2, PDGFRA, PHOX2B, PMS2, POLD1, POLE, POT1, PRKAR1A, PTCH1, PTEN, RAD50, RAD51C, RAD51D, RB1, RECQL4, RET, RNF43, RUNX1, SDHAF2, SDHA (sequence changes only), SDHB, SDHC, SDHD, SMAD4, SMARCA4, SMARCB1, SMARCE1, STK11, SUFU, TERC, TERT, TMEM127, TP53, TSC1, TSC2, VHL, WRN and WT1.     08/29/2019 Surgery   RIGHT MASTECTOMY WITH SENTINEL LYMPH NODE BIOPSY and RECONSTRUCTION WITH PLACEMENT OF TISSUE EXPANDER AND FLEX HD (ACELLULAR HYDRATED DERMIS) by Dr. TMarlou Starksand Dr DMarla Roe 09/07/19   09/07/2019 Pathology Results   DIAGNOSIS: 09/07/19  A. SENTINEL LYMPH NODE, RIGHT # 1, BIOPSY:  - One lymph node, negative for carcinoma (0/1).   B. SENTINEL LYMPH NODE, RIGHT # 2, BIOPSY:  - One lymph node, negative for carcinoma (0/1).   C. BREAST, RIGHT, MASTECTOMY:  - Invasive ductal carcinoma, 1.8 cm, Nottingham grade 1 of 3.  - Margins of resection are not involved (Closest margin: 20 mm, deep).  - Ductal carcinoma in situ, focal.  - Biopsy site changes.  - See oncology table.       CURRENT THERAPY:  Anastrozole 147mdaily starting 10/28/2019  INTERVAL HISTORY:  Rachel MOGERs here for a follow up after surgery. He presents to the clinic alone. She underwent right mastectomy and sentinel lymph node biopsy on September 07, 2019, she is recovering well from her surgery.  She has some pain at the surgical site and tissue expander, the range of motion of the right shoulder is normal, no  lymphedema or other complaints.  Review of system otherwise negative.   MEDICAL HISTORY:  Past Medical History:  Diagnosis Date  . Anxiety   . Arthritis   . [place]   Right T1c, N0  . C. difficile colitis   . Colitis, collagenous 09/22/2015  . Depression   . Essential hypertension, benign   . Family history of brain cancer   . Family history of esophageal cancer   . Family history of lung cancer   . Family history of lymphoma   . Family history of pancreatic cancer   . Fuchs' corneal dystrophy   . GERD (gastroesophageal reflux disease)   . Hypertension   . Mitral valve prolapse    Mild mitral regurgitation  . Mixed hyperlipidemia   . Seizures ([place]   Only with dose of haldol; no more since that was stopped.    SURGICAL HISTORY: Past Surgical History:  Procedure Laterality Date  . ABDOMINAL HYSTERECTOMY  1999  . BACK SURGERY    . BIOPSY N/A 09/28/2015   Procedure: BIOPSY;  Surgeon: SaDanie BinderMD;  Location: [facility];  Service: Endoscopy;  Laterality: N/A;  . BIOPSY  04/05/2016   Procedure: BIOPSY;  Surgeon: SaDanie BinderMD;  [facility];  Service: Endoscopy;;  colon bx  . BREAST LUMPECTOMY  10/30/2002  right  . BREAST RECONSTRUCTION WITH PLACEMENT OF TISSUE EXPANDER AND FLEX HD (ACELLULAR HYDRATED DERMIS) Right 09/07/2019   Procedure: RIGHT BREAST RECONSTRUCTION WITH PLACEMENT OF TISSUE EXPANDER AND FLEX HD (ACELLULAR HYDRATED DERMIS);  Surgeon: Wallace Going, DO;  Location: [facility];  Service: Plastics;  Laterality: Right;  . CARDIAC CATHETERIZATION    . Carpal tunnel R/L  1992  . Cervical spine fusion X2 since last visit    . COLONOSCOPY WITH PROPOFOL N/A 09/28/2015   Procedure: COLONOSCOPY WITH PROPOFOL;  Surgeon: Danie Binder, MD;  Location: [facility];  Service: Endoscopy;  Laterality: N/A;  procedure 1 cecum time in 1232  time out 1246   total time 14 mintes  . COLONOSCOPY WITH PROPOFOL N/A 04/05/2016   Procedure:  COLONOSCOPY WITH PROPOFOL;  Surgeon: Danie Binder, MD;  [facility];  Service: Endoscopy;  Laterality: N/A;  1200  . 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 ELBOW SURGERY Right  tennis elbow release 2012  . ESOPHAGOGASTRODUODENOSCOPY  10/26/2012   Procedure: ESOPHAGOGASTRODUODENOSCOPY (EGD);  Surgeon: Inda Castle, MD;  Location: [facility];  Service: Endoscopy;  Laterality: N/A;  . ESOPHAGOGASTRODUODENOSCOPY (EGD) WITH PROPOFOL N/A 09/28/2015   Procedure: ESOPHAGOGASTRODUODENOSCOPY (EGD) WITH PROPOFOL;  Surgeon: Danie Binder, MD;  Location: [facility];  Service: Endoscopy;  Laterality: N/A;  . EYE SURGERY  2018   corneal transplants bilateral  . KNEE ARTHROSCOPY Left   . LEFT HEART CATHETERIZATION WITH CORONARY ANGIOGRAM N/A 10/23/2012   Procedure: LEFT HEART CATHETERIZATION WITH CORONARY ANGIOGRAM;  Surgeon: Peter M Martinique, MD;  Location: [facility] CATH LAB;  Service: Cardiovascular;  Laterality: N/A;  . MASTECTOMY W/ SENTINEL NODE BIOPSY Right 09/07/2019   Procedure: RIGHT MASTECTOMY WITH SENTINEL LYMPH NODE BIOPSY;  Surgeon: Jovita Kussmaul, MD;  Location: [facility];  Service: General;  Laterality: Right;  . OVARIAN CYST REMOVAL     prior to hysterectomy, right  . Ray cage fusion  1997  . SENTINEL LYMPH NODE BIOPSY  10/30/2002   right  . TONSILLECTOMY     at 53 months old    I have reviewed the social history and family history with the patient and they are unchanged from previous note.  ALLERGIES:  is allergic to bee venom; haloperidol lactate; meperidine hcl; and xifaxan [rifaximin].  MEDICATIONS:  Current Outpatient Medications  Medication Sig Dispense Refill  . acetaminophen (TYLENOL) 500 MG tablet Take 1,000 mg by mouth every 6 (six) hours as needed for moderate pain or headache.    . ALPRAZolam (XANAX) 0.5 MG tablet Take 1 tablet (0.5 mg total) by mouth daily. 90 tablet 2  . anastrozole (ARIMIDEX) 1 MG tablet TAKE 1 TABLET(1 MG) BY MOUTH DAILY 30 tablet 2  .  Calcium-Magnesium-Vitamin D (CALCIUM 500 PO) Take 1 tablet by mouth daily.     . cyclobenzaprine (FLEXERIL) 10 MG tablet Take 10 mg by mouth 3 (three) times daily as needed for muscle spasms.    . diazepam (VALIUM) 2 MG tablet Take 1 tablet (2 mg total) by mouth every 12 (twelve) hours as needed for muscle spasms. 20 tablet 0  . diclofenac (VOLTAREN) 75 MG EC tablet Take 75 mg by mouth 2 (two) times daily as needed for mild pain.    Marland Kitchen diclofenac sodium (VOLTAREN) 1 % GEL Apply 1 application topically 3 (three) times daily as needed (pain).     Marland Kitchen EPINEPHrine (EPI-PEN) 0.3 mg/0.3 mL DEVI Inject 0.3 mg into the muscle as needed (bee  stings).     . fish oil-omega-3 fatty acids 1000 MG capsule Take 1 g by mouth every morning.     . hydrochlorothiazide (MICROZIDE) 12.5 MG capsule TAKE 1 CAPSULE DAILY (Patient taking differently: Take 12.5 mg by mouth daily. ) 90 capsule 3  . metoprolol succinate (TOPROL-XL) 50 MG 24 hr tablet TAKE 1 TABLET TWICE A DAY  WITH OR IMMEDIATELY        FOLLOWING A MEAL. (Patient taking differently: Take 50 mg by mouth 2 (two) times daily. ) 180 tablet 3  . Multiple Vitamin (MULITIVITAMIN WITH MINERALS) TABS Take 1 tablet by mouth every morning.    Marland Kitchen NIFEdipine (PROCARDIA XL/NIFEDICAL-XL) 90 MG 24 hr tablet TAKE 1 TABLET DAILY (Patient taking differently: Take 90 mg by mouth daily. ) 90 tablet 3  . nitroGLYCERIN (NITROSTAT) 0.4 MG SL tablet Place 1 tablet (0.4 mg total) under the tongue every 5 (five) minutes x 3 doses as needed for chest pain. 25 tablet 3  . ondansetron (ZOFRAN) 4 MG tablet Take 1 tablet (4 mg total) by mouth every 8 (eight) hours as needed for nausea or vomiting. 20 tablet 0  . pantoprazole (PROTONIX) 40 MG tablet TAKE 1 TABLET DAILY 30 tablet 6  . CIMZIA PREFILLED 2 X 200 MG/ML KIT Inject 200 mg as directed every 14 (fourteen) days.    . folic acid (FOLVITE) 1 MG tablet Take 1 mg by mouth every evening.    Marland Kitchen HYDROcodone-acetaminophen (NORCO/VICODIN) 5-325 MG  tablet Take 1 tablet by mouth every 6 (six) hours as needed for moderate pain.    . methotrexate (RHEUMATREX) 2.5 MG tablet Take 10 mg by mouth every Thursday.      No current facility-administered medications for this visit.     PHYSICAL EXAMINATION: ECOG PERFORMANCE STATUS: 0 - Asymptomatic  Vitals:   10/26/19 0807  BP: 137/73  Pulse: 90  Resp: 18  Temp: 98 F (36.7 C)  SpO2: 96%   Filed Weights   10/26/19 0807  Weight: 177 lb 3.2 oz (80.4 kg)    GENERAL:alert, no distress and comfortable SKIN: skin color, texture, turgor are normal, no rashes or significant lesions EYES: normal, Conjunctiva are pink and non-injected, sclera clear NECK: supple, thyroid normal size, non-tender, without nodularity LYMPH:  no palpable lymphadenopathy in the cervical, axillary  LUNGS: clear to auscultation and percussion with normal breathing effort HEART: regular rate & rhythm and no murmurs and no lower extremity edema ABDOMEN:abdomen soft, non-tender and normal bowel sounds Musculoskeletal:no cyanosis of digits and no clubbing  NEURO: alert & oriented x 3 with fluent speech, no focal motor/sensory deficits Breasts: Breast inspection showed status post right mastectomy and tissue expander placement, surgical incision is healing well, no discharge or skin erythema. Palpation of the left breasts and axilla revealed no obvious mass that I could appreciate.   LABORATORY DATA:  I have reviewed the data as listed CBC Latest Ref Rng & Units 09/03/2019 07/08/2019 04/03/2016  WBC 4.0 - 10.5 K/uL 10.1 10.4 9.3  Hemoglobin 12.0 - 15.0 g/dL 14.6 14.0 12.6  Hematocrit 36.0 - 46.0 % 42.0 40.8 36.9  Platelets 150 - 400 K/uL 375 378 389     CMP Latest Ref Rng & Units 09/03/2019 07/08/2019 04/03/2016  Glucose 70 - 99 mg/dL 166(H) 106(H) 118(H)  BUN 6 - 20 mg/dL 10 13 15   Creatinine 0.44 - 1.00 mg/dL 0.78 0.78 0.70  Sodium 135 - 145 mmol/L 138 140 140  Potassium 3.5 - 5.1 mmol/L 3.8 4.0 4.4  Chloride 98 -  111 mmol/L 103 104 108  CO2 22 - 32 mmol/L 23 26 25   Calcium 8.9 - 10.3 mg/dL 9.5 9.6 8.4(L)  Total Protein 6.5 - 8.1 g/dL - 7.5 -  Total Bilirubin 0.3 - 1.2 mg/dL - 0.9 -  Alkaline Phos 38 - 126 U/L - 103 -  AST 15 - 41 U/L - 27 -  ALT 0 - 44 U/L - 26 -      RADIOGRAPHIC STUDIES: I have personally reviewed the radiological images as listed and agreed with the findings in the report. No results found.   ASSESSMENT & PLAN:  NAVPREET Sutton is a 58 y.o. female with   1 Malignant neoplasm of upper-outer quadrant of right breast, Stage IA, pT1cN0M0, ER+/PP+, HER2-, Grade 1 -She was diagnosed in 08/2019. Given prior right lumpectomy and RT she underwent right mastectomy on 09/07/19.  -We discussed her pathology which shows a 1.8 cm invasive ductal carcinoma, in the DCIS, margins were negative, 2 lymph nodes were all negative. -We discussed her risk of recurrence.  We reviewed the Oncotype result, recurrence score was 17, which predicts 5% risk of distant recurrence in the next 9 years if she takes tamoxifen.  Due to low risk, adjuvant chemotherapy is not recommended. -She has had mastectomy, no adjuvant radiation is needed due to the early stage disease. --Given the strong ER and PR positivity, I do recommend adjuvant aromatase inhibitor to reduce her risk of cancer recurrence,  The potential benefit and side effects, which includes but not limited to, hot flash, skin and vaginal dryness, metabolic changes ( increased blood glucose, cholesterol, weight, etc.), slightly in increased risk of cardiovascular disease, cataracts, muscular and joint discomfort, osteopenia and osteoporosis, etc, were discussed with her again in great details. She is interested. She started anastrozole before surgery and has been tolerating well, will continue for 5-7 years  -Weight again discussed breast cancer surveillance, she will continue screening mammogram annually, will see her every 3 to 6 months for lab and exam    2. H/o Right breast ductal carcinoma, pT1cN0M0 stage IA, ER-/PR-, HER2 +, Grade 3 -Diagnosed on 10/16/02 managed by Dr. Eston Esters  -Treated with right lumpectomy and SNLB, Adjuvant chemo AC-Docetaxol/Herceptin and RT   3. Genetic testing was negative for pathogentic mutations.    4. Rheumatoid arthritis, diagnosed in 2018.  -She is seen by Rheumatologist.  -She is currently on low dose weekly methotrexate and Cimzia self-injections q2weeks. She stopped injections after breast cancer diagnosis in 08/2019 -Her rheumatologist Dr. Kathlene November recommended her to Leflunomide, which is fine with [place].    5. H/o Mitral Valve, HTN, GERD -Had cardiac cath in 2013, no stent placed.  -On Metoprolol, nitroglycerin, Protonix    6. Smoking Cessation, Anxiety   -She used to smokes 2 ppd now smokes 1/2 ppd. She has a 30 year history. She has been counseled on smoking cessation.  -She was on Paxil before and she was on Xanax for anxiety but is currently being weaned off. Mood has been stable with cancer diagnosis, per patient.    PLAN:  -she will continue Anastrozole  -I called her husband after her visit, and answered his questions also  -OK to use Leflunomide from oncology standpoint  -Survivorship clinic in 3 months -Lab and follow-up in 6 months   No problem-specific Assessment & Plan notes found for this encounter.   No orders of the defined types were placed in this encounter.  All questions were answered.  The patient knows to call the clinic with any problems, questions or concerns. No barriers to learning was detected. I spent 20 minutes counseling the patient face to face. The total time spent in the appointment was 25 minutes and more than 50% was on counseling and review of test results     Truitt Merle, MD 10/28/2019   I, Joslyn Devon, am acting as scribe for Truitt Merle, MD.   I have reviewed the above documentation for accuracy and completeness, and I agree with the above.

## 2019-10-26 ENCOUNTER — Telehealth: Payer: Self-pay | Admitting: Hematology

## 2019-10-26 ENCOUNTER — Ambulatory Visit (INDEPENDENT_AMBULATORY_CARE_PROVIDER_SITE_OTHER): Payer: Commercial Managed Care - PPO | Admitting: Nurse Practitioner

## 2019-10-26 ENCOUNTER — Inpatient Hospital Stay: Payer: Commercial Managed Care - PPO | Attending: Hematology | Admitting: Hematology

## 2019-10-26 ENCOUNTER — Other Ambulatory Visit: Payer: Self-pay

## 2019-10-26 ENCOUNTER — Encounter: Payer: Self-pay | Admitting: *Deleted

## 2019-10-26 ENCOUNTER — Encounter: Payer: Self-pay | Admitting: Nurse Practitioner

## 2019-10-26 VITALS — BP 137/73 | HR 90 | Temp 98.0°F | Resp 18 | Ht 63.0 in | Wt 177.2 lb

## 2019-10-26 VITALS — BP 127/80 | HR 88 | Temp 97.3°F | Wt 176.4 lb

## 2019-10-26 DIAGNOSIS — Z9889 Other specified postprocedural states: Secondary | ICD-10-CM

## 2019-10-26 DIAGNOSIS — C50411 Malignant neoplasm of upper-outer quadrant of right female breast: Secondary | ICD-10-CM | POA: Insufficient documentation

## 2019-10-26 DIAGNOSIS — Z23 Encounter for immunization: Secondary | ICD-10-CM | POA: Diagnosis not present

## 2019-10-26 DIAGNOSIS — Z170 Estrogen receptor positive status [ER+]: Secondary | ICD-10-CM | POA: Insufficient documentation

## 2019-10-26 MED ORDER — INFLUENZA VAC SPLIT QUAD 0.5 ML IM SUSY
PREFILLED_SYRINGE | INTRAMUSCULAR | Status: AC
  Filled 2019-10-26: qty 0.5

## 2019-10-26 MED ORDER — INFLUENZA VAC SPLIT QUAD 0.5 ML IM SUSY
0.5000 mL | PREFILLED_SYRINGE | Freq: Once | INTRAMUSCULAR | Status: AC
  Administered 2019-10-26: 0.5 mL via INTRAMUSCULAR

## 2019-10-26 NOTE — Telephone Encounter (Signed)
Scheduled appt per 11/9 los.  Spoke with pt and she is aware of her appt date and time.

## 2019-10-28 ENCOUNTER — Encounter: Payer: Self-pay | Admitting: Hematology

## 2019-11-03 ENCOUNTER — Other Ambulatory Visit: Payer: Self-pay

## 2019-11-03 ENCOUNTER — Encounter: Payer: Self-pay | Admitting: Surgical

## 2019-11-03 ENCOUNTER — Ambulatory Visit (INDEPENDENT_AMBULATORY_CARE_PROVIDER_SITE_OTHER): Payer: Commercial Managed Care - PPO | Admitting: Surgical

## 2019-11-03 VITALS — BP 135/77 | HR 82 | Temp 97.1°F | Ht 63.0 in | Wt 177.0 lb

## 2019-11-03 DIAGNOSIS — Z170 Estrogen receptor positive status [ER+]: Secondary | ICD-10-CM

## 2019-11-03 DIAGNOSIS — C50411 Malignant neoplasm of upper-outer quadrant of right female breast: Secondary | ICD-10-CM

## 2019-11-03 DIAGNOSIS — Z9889 Other specified postprocedural states: Secondary | ICD-10-CM

## 2019-11-03 NOTE — Progress Notes (Signed)
   Subjective:     Patient ID: [name], female    DOB: 11-Feb-1961, 58 y.o.   MRN: [id number]  Chief Complaint  Patient presents with  . Follow-up    HPI: The patient is a 58 y.o. female here for follow-up on her right breast reconstruction. She is overall doing well.  At her last visit she had 90 cc of fluid aspirated from her right axillary region.   Today she has a little bit of swelling, but minimal.  No gross swelling in the axillary region, skin is soft, nonerythematous, normal temperature to touch.  No fever, chills, nausea, vomiting.  No complaints.  Incisions are C/D/I.   Review of Systems  Constitutional: Positive for activity change. Negative for appetite change, chills, diaphoresis, fatigue and fever.  Respiratory: Negative.   Cardiovascular: Negative.   Gastrointestinal: Negative for diarrhea, nausea and vomiting.  Musculoskeletal: Negative for myalgias.  Skin: Negative for color change, pallor, rash and wound.  Neurological: Negative for dizziness and weakness.    Objective:   Vital Signs BP 135/77 (BP Location: Left Arm, Patient Position: Sitting, Cuff Size: Normal)   Pulse 82   Temp (!) 97.1 F (36.2 C) (Temporal)   Ht 5\' 3"  (1.6 m)   Wt 177 lb (80.3 kg)   SpO2 97%   BMI 31.35 kg/m  Vital Signs and Nursing Note Reviewed Chaperone present Physical Exam  Constitutional: She is oriented to person, place, and time and well-developed, well-nourished, and in no distress.  HENT:  Head: Normocephalic and atraumatic.  Cardiovascular: Normal rate.  Pulmonary/Chest: Effort normal.  Right mastectomy incisions, slight fluid wave on exam. No overt swelling, no erythema.  Musculoskeletal: Normal range of motion.  Neurological: She is alert and oriented to person, place, and time. Gait normal.  Skin: Skin is warm and dry. No rash noted. She is not diaphoretic. No erythema. No pallor.  Psychiatric: Mood and affect normal.   Assessment/Plan:     ICD-10-CM    1. Status post right breast reconstruction  Z98.890   2. Malignant neoplasm of upper-outer quadrant of right breast in female, estrogen receptor positive ([place])  C50.411    Z17.0    Mrs. Spratt is doing well.  She had a little bit of a fluid wave on her right axillary region, but not overtly concerning or large.  The risk versus benefit of drainage was explained to the patient and due to the low volume, wait until follow-up visit in 2 weeks.  If she develops any increased swelling or pain prior to next week's visit call and we can drain if necessary.  We expanded her right breast expander with 40 cc of injectable saline using a sterile technique.  We placed injectable saline in the Expander using a sterile technique: Right: 40 cc for a total of 360 / 535 cc  Follow-up in 2 weeks for reevaluation and possible fill.  She feels that she is very close to her desired size.  Her expansion is limited due to her history of radiation.  Carola Rhine Divine Imber, PA-C 11/03/2019, 10:34 AM

## 2019-11-17 ENCOUNTER — Ambulatory Visit (INDEPENDENT_AMBULATORY_CARE_PROVIDER_SITE_OTHER): Payer: Commercial Managed Care - PPO | Admitting: Surgical

## 2019-11-17 ENCOUNTER — Encounter: Payer: Self-pay | Admitting: Surgical

## 2019-11-17 ENCOUNTER — Other Ambulatory Visit: Payer: Self-pay

## 2019-11-17 VITALS — BP 152/79 | HR 119 | Temp 97.7°F | Ht 63.0 in | Wt 172.8 lb

## 2019-11-17 DIAGNOSIS — C50411 Malignant neoplasm of upper-outer quadrant of right female breast: Secondary | ICD-10-CM

## 2019-11-17 DIAGNOSIS — Z170 Estrogen receptor positive status [ER+]: Secondary | ICD-10-CM

## 2019-11-17 DIAGNOSIS — Z9889 Other specified postprocedural states: Secondary | ICD-10-CM

## 2019-11-17 NOTE — Progress Notes (Addendum)
   Subjective:     Patient ID: Rachel Sutton, female    DOB: 06/02/1961, 58 y.o.   MRN: [id number]  Chief Complaint  Patient presents with  . Follow-up    2 weeks    HPI: The patient is a 58 y.o. female here for follow-up on right breast reconstruction. She is doing well. Incisions healing well. She is happy with current size of expander. She would like to plan for exchange surgery.   No fever, chills, n/v. No complaints. No pain. ROM is doing well.  Review of Systems  Constitutional: Negative for activity change, appetite change, chills, diaphoresis, fatigue and fever.  Respiratory: Negative.   Cardiovascular: Negative.   Gastrointestinal: Negative.   Musculoskeletal: Negative.   Skin: Negative for color change, pallor, rash and wound.  Neurological: Negative.    Objective:   Vital Signs BP (!) 152/79 (BP Location: Left Arm, Patient Position: Sitting, Cuff Size: Large)   Pulse (!) 119   Temp 97.7 F (36.5 C) (Temporal)   Ht 5\' 3"  (1.6 m)   Wt 172 lb 12.8 oz (78.4 kg)   SpO2 96%   BMI 30.61 kg/m  Vital Signs and Nursing Note Reviewed Chaperone present Physical Exam  Constitutional: She is oriented to person, place, and time and well-developed, well-nourished, and in no distress.  HENT:  Head: Normocephalic and atraumatic.  Cardiovascular: Normal rate.  Pulmonary/Chest: Effort normal.    Musculoskeletal: Normal range of motion.  Neurological: She is alert and oriented to person, place, and time. Gait normal.  Skin: Skin is warm and dry. No rash noted. She is not diaphoretic. No erythema. No pallor.  Psychiatric: Mood and affect normal.    Assessment/Plan:     ICD-10-CM   1. Status post right breast reconstruction  Z98.890   2. Malignant neoplasm of upper-outer quadrant of right breast in female, estrogen receptor positive (Rachel Sutton)  C50.411    Z17.0     Rachel Sutton is doing really well. She is happy with current size of her expander, would like to plan for  exchange surgery.  Incisions healing well.   Plan for reduction/mastopexy left to match right breast implant. Plan for procedure mid-late January. Follow up/pre-op early January.  Right: 0 cc for a total of 360 / 535 cc  Call with questions or concerns prior to next visit. Pictures were obtained of the patient and placed in the chart with the patient's or guardian's permission.   Carola Rhine Rachel Conrey, PA-C 11/18/2019, 9:20 AM

## 2019-12-15 ENCOUNTER — Encounter: Payer: Self-pay | Admitting: *Deleted

## 2019-12-16 ENCOUNTER — Other Ambulatory Visit: Payer: Self-pay

## 2019-12-16 DIAGNOSIS — C50411 Malignant neoplasm of upper-outer quadrant of right female breast: Secondary | ICD-10-CM

## 2019-12-16 DIAGNOSIS — Z170 Estrogen receptor positive status [ER+]: Secondary | ICD-10-CM

## 2019-12-16 MED ORDER — ANASTROZOLE 1 MG PO TABS: ORAL_TABLET | ORAL | 2 refills | Status: DC

## 2019-12-16 NOTE — Telephone Encounter (Signed)
Patient calls requesting 90 day supply of anastrozole be sent into [facility] on file, this was done and patient was notified.

## 2019-12-21 ENCOUNTER — Telehealth: Payer: Self-pay | Admitting: Plastic Surgery

## 2019-12-21 NOTE — Telephone Encounter (Signed)

## 2019-12-22 ENCOUNTER — Ambulatory Visit (INDEPENDENT_AMBULATORY_CARE_PROVIDER_SITE_OTHER): Payer: Commercial Managed Care - PPO | Admitting: Plastic Surgery

## 2019-12-22 ENCOUNTER — Other Ambulatory Visit: Payer: Self-pay

## 2019-12-22 ENCOUNTER — Encounter: Payer: Self-pay | Admitting: Plastic Surgery

## 2019-12-22 DIAGNOSIS — Z9010 Acquired absence of unspecified breast and nipple: Secondary | ICD-10-CM | POA: Diagnosis not present

## 2019-12-22 NOTE — H&P (View-Only) (Signed)
   Subjective:    Patient ID: Rachel Sutton, female    DOB: 1961/10/17, 59 y.o.   MRN: [id number]  The patient is a 59 year old female here for follow-up on her right breast reconstruction.  In September she underwent a right mastectomy with immediate reconstruction.  An expander and Flex HD was placed.  She is being expanded and has done well with tolerating the expansions.  She does not have any sign of infection or seroma.  Her skin has responded in the radiated area with redness.  It has not improved like we were hoping.  The patient states she has thought long and hard and wants to have the expander removed without any further reconstruction.  She does not want the left side lifted at this time either.  The patient states she is talked to her husband about it and he is very supportive for what ever she wants to do.  I think this is reasonable and a very personal decision.  Her radiation was from 2003 when she had a lumpectomy for right breast cancer.  She is also on the methotrexate for her rheumatoid arthritis so this probably slowed her healing down some.      Review of Systems  Constitutional: Negative.   HENT: Negative.   Eyes: Negative.   Respiratory: Negative.   Cardiovascular: Negative.   Gastrointestinal: Negative.   Genitourinary: Negative.   Musculoskeletal: Negative.   Hematological: Negative.   Psychiatric/Behavioral: Negative.        Objective:   Physical Exam Vitals and nursing note reviewed.  Constitutional:      Appearance: Normal appearance.  HENT:     Head: Normocephalic and atraumatic.  Cardiovascular:     Rate and Rhythm: Normal rate.  Pulmonary:     Effort: Pulmonary effort is normal.  Abdominal:     General: Abdomen is flat.  Skin:    General: Skin is warm.  Neurological:     General: No focal deficit present.     Mental Status: She is alert and oriented to person, place, and time.  Psychiatric:        Mood and Affect: Mood normal.      Behavior: Behavior normal.          Assessment & Plan:     ICD-10-CM   1. Acquired absence of breast, unspecified laterality  Z90.10     Plan for removal of right breast expander and excision of excess skin.  We will plan for no replacement with an implant.  The patient was given a prescription for second to nature and was excited about going to see what they have available. Right:  360 / 535 cc   Pictures were obtained of the patient and placed in the chart with the patient's or guardian's permission.

## 2019-12-22 NOTE — Patient Instructions (Signed)
c 

## 2019-12-22 NOTE — Progress Notes (Signed)
   Subjective:    Patient ID: Rachel Sutton, female    DOB: [birth year], 59 y.o.   MRN: [id number]  The patient is a 59 year old female here for follow-up on her right breast reconstruction.  In September she underwent a right mastectomy with immediate reconstruction.  An expander and Flex HD was placed.  She is being expanded and has done well with tolerating the expansions.  She does not have any sign of infection or seroma.  Her skin has responded in the radiated area with redness.  It has not improved like we were hoping.  The patient states she has thought long and hard and wants to have the expander removed without any further reconstruction.  She does not want the left side lifted at this time either.  The patient states she is talked to her husband about it and he is very supportive for what ever she wants to do.  I think this is reasonable and a very personal decision.  Her radiation was from 2003 when she had a lumpectomy for right breast cancer.  She is also on the methotrexate for her rheumatoid arthritis so this probably slowed her healing down some.      Review of Systems  Constitutional: Negative.   HENT: Negative.   Eyes: Negative.   Respiratory: Negative.   Cardiovascular: Negative.   Gastrointestinal: Negative.   Genitourinary: Negative.   Musculoskeletal: Negative.   Hematological: Negative.   Psychiatric/Behavioral: Negative.        Objective:   Physical Exam Vitals and nursing note reviewed.  Constitutional:      Appearance: Normal appearance.  HENT:     Head: Normocephalic and atraumatic.  Cardiovascular:     Rate and Rhythm: Normal rate.  Pulmonary:     Effort: Pulmonary effort is normal.  Abdominal:     General: Abdomen is flat.  Skin:    General: Skin is warm.  Neurological:     General: No focal deficit present.     Mental Status: She is alert and oriented to person, place, and time.  Psychiatric:        Mood and Affect: Mood normal.      Behavior: Behavior normal.          Assessment & Plan:     ICD-10-CM   1. Acquired absence of breast, unspecified laterality  Z90.10     Plan for removal of right breast expander and excision of excess skin.  We will plan for no replacement with an implant.  The patient was given a prescription for second to nature and was excited about going to see what they have available. Right:  360 / 535 cc   Pictures were obtained of the patient and placed in the chart with the patient's or guardian's permission.

## 2019-12-24 ENCOUNTER — Telehealth: Payer: Self-pay | Admitting: Plastic Surgery

## 2019-12-24 ENCOUNTER — Encounter (HOSPITAL_BASED_OUTPATIENT_CLINIC_OR_DEPARTMENT_OTHER): Payer: Self-pay | Admitting: Plastic Surgery

## 2019-12-24 ENCOUNTER — Other Ambulatory Visit: Payer: Self-pay

## 2019-12-24 NOTE — Telephone Encounter (Signed)
Rachel Sutton called to find out how long she needs to be out of work for this surgery. She said that it changed and she forgot to ask when she was here. You can call her back on her cell.

## 2019-12-25 NOTE — Telephone Encounter (Signed)
Called patient and advised she will only be out of work for one week after surgery for recovery

## 2019-12-28 ENCOUNTER — Other Ambulatory Visit (HOSPITAL_COMMUNITY)
Admission: RE | Admit: 2019-12-28 | Discharge: 2019-12-28 | Disposition: A | Discharge status: Home / Self Care | Payer: Commercial Managed Care - PPO | Source: Ambulatory Visit | Attending: Plastic Surgery | Admitting: Plastic Surgery

## 2019-12-28 ENCOUNTER — Encounter (HOSPITAL_BASED_OUTPATIENT_CLINIC_OR_DEPARTMENT_OTHER)
Admission: RE | Admit: 2019-12-28 | Discharge: 2019-12-28 | Disposition: A | Discharge status: Home / Self Care | Payer: Commercial Managed Care - PPO | Source: Ambulatory Visit | Attending: Plastic Surgery | Admitting: Plastic Surgery

## 2019-12-28 ENCOUNTER — Other Ambulatory Visit (HOSPITAL_COMMUNITY): Payer: Self-pay | Admitting: Psychiatry

## 2019-12-28 ENCOUNTER — Ambulatory Visit (HOSPITAL_COMMUNITY): Payer: Commercial Managed Care - PPO | Admitting: Psychiatry

## 2019-12-28 ENCOUNTER — Other Ambulatory Visit: Payer: Self-pay

## 2019-12-28 ENCOUNTER — Other Ambulatory Visit (HOSPITAL_BASED_OUTPATIENT_CLINIC_OR_DEPARTMENT_OTHER): Payer: Commercial Managed Care - PPO

## 2019-12-28 DIAGNOSIS — Z01812 Encounter for preprocedural laboratory examination: Secondary | ICD-10-CM | POA: Insufficient documentation

## 2019-12-28 DIAGNOSIS — Z20822 Contact with and (suspected) exposure to covid-19: Secondary | ICD-10-CM | POA: Insufficient documentation

## 2019-12-28 DIAGNOSIS — Z9011 Acquired absence of right breast and nipple: Secondary | ICD-10-CM | POA: Insufficient documentation

## 2019-12-28 LAB — BASIC METABOLIC PANEL
Anion gap: 11 (ref 5–15)
BUN: 14 mg/dL (ref 6–20)
CO2: 27 mmol/L (ref 22–32)
Calcium: 9.3 mg/dL (ref 8.9–10.3)
Chloride: 101 mmol/L (ref 98–111)
Creatinine, Ser: 0.8 mg/dL (ref 0.44–1.00)
GFR calc Af Amer: >60 mL/min (ref >60)
GFR calc non Af Amer: >60 mL/min (ref >60)
Glucose, Bld: 103 mg/dL — ABNORMAL HIGH (ref 70–99)
Potassium: 4.7 mmol/L (ref 3.5–5.1)
Sodium: 139 mmol/L (ref 135–145)

## 2019-12-28 NOTE — Telephone Encounter (Signed)
Call pt for appt 

## 2019-12-28 NOTE — Telephone Encounter (Signed)
PATIENT ALREADY SCHEDULED FOR  12/29/19 APT 940 AM

## 2019-12-29 ENCOUNTER — Other Ambulatory Visit: Payer: Self-pay

## 2019-12-29 ENCOUNTER — Ambulatory Visit (INDEPENDENT_AMBULATORY_CARE_PROVIDER_SITE_OTHER): Payer: Commercial Managed Care - PPO | Admitting: Psychiatry

## 2019-12-29 ENCOUNTER — Encounter (HOSPITAL_COMMUNITY): Payer: Self-pay | Admitting: Psychiatry

## 2019-12-29 DIAGNOSIS — F322 Major depressive disorder, single episode, severe without psychotic features: Secondary | ICD-10-CM

## 2019-12-29 LAB — NOVEL CORONAVIRUS, NAA (HOSP ORDER, SEND-OUT TO REF LAB; TAT 18-24 HRS): SARS-CoV-2, NAA: NOT DETECTED

## 2019-12-29 NOTE — Progress Notes (Signed)
Virtual Visit via Video Note  I connected with Rachel Sutton on 12/29/19 at  8:40 AM EST by a video enabled telemedicine application and verified that I am speaking with the correct person using two identifiers.   I discussed the limitations of evaluation and management by telemedicine and the availability of in person appointments. The patient expressed understanding and agreed to proceed   I discussed the assessment and treatment plan with the patient. The patient was provided an opportunity to ask questions and all were answered. The patient agreed with the plan and demonstrated an understanding of the instructions.   The patient was advised to call back or seek an in-person evaluation if the symptoms worsen or if the condition fails to improve as anticipated.  I provided 15 minutes of non-face-to-face time during this encounter.   Levonne Spiller, MD  [facility] MD/PA/NP OP Progress Note  12/29/2019 9:01 AM Rachel Sutton  MRN:  [id number]  Chief Complaint:  Chief Complaint    Depression; Anxiety; Follow-up     HPI: this patient is a 59 year oldrecently remarried white female is currently living in [place].Marland Kitchen She works as a Engineer, production for [employer]   The patient was referred by her primary physician Dr. Marolyn Haller for further assessment and treatment of anxiety and depression.  The patient states that she does not really have any history of mental illness or treatment. She and her husband moved her parents here a few years ago because their health was declining.she took care of both of them. Her mother died in Apr 05, 2012 of pancreatic cancer and her father died 36 months later. During this time Dr. Luan Pulling prescribed clonazepam to help with anxiety. In January 2014 her husband was diagnosed with a myocardial infarction congestive heart failure and COPD. His health declined quickly and he died in Sep 05, 2014  The patient returns for follow-up after about 6 months.  Unfortunately since I  last saw her she found a lump in her right breast which turned out to be cancerous.  She has had a right mastectomy back in September and has had some reconstructive surgery.  She is opted not to get an implant however because she is worried about having problems with that later.  She states that she has done well with all of this and has not been particularly depressed or anxious.  She still uses the Xanax 0.5 mg daily for anxiety and it is working well.  She is sleeping well at night.  She continues to work from home and feels that she has been quite productive.  Her marriage is going well and her husband is very supportive.  She denies any symptoms of depression anxiety or suicidal ideation  Visit Diagnosis:    ICD-10-CM   1. Major depressive disorder, single episode, severe without psychotic features ([place])  F32.2     Past Psychiatric History: none  Past Medical History:  Past Medical History:  Diagnosis Date  . Anxiety   . Arthritis   . Breast cancer ([place]) 02/22/2012   Right T1c, N0  . C. difficile colitis   . Colitis, collagenous 09/22/2015  . Depression   . Essential hypertension, benign   . Family history of brain cancer   . Family history of esophageal cancer   . Family history of lung cancer   . Family history of lymphoma   . Family history of pancreatic cancer   . Fuchs' corneal dystrophy   . GERD (gastroesophageal reflux disease)   . Hypertension   .  Mitral valve prolapse    Mild mitral regurgitation  . Mixed hyperlipidemia   . Seizures ([place])    Only with dose of haldol; no more since that was stopped.    Past Surgical History:  Procedure Laterality Date  . ABDOMINAL HYSTERECTOMY  1999  . BACK SURGERY    . BIOPSY N/A 09/28/2015   Procedure: BIOPSY;  Surgeon: Danie Binder, MD;  Location: [facility];  Service: Endoscopy;  Laterality: N/A;  . BIOPSY  04/05/2016   Procedure: BIOPSY;  Surgeon: Danie Binder, MD;  Location: [facility];  Service: Endoscopy;;  colon bx  .  BREAST LUMPECTOMY  10/30/2002   right  . BREAST RECONSTRUCTION WITH PLACEMENT OF TISSUE EXPANDER AND FLEX HD (ACELLULAR HYDRATED DERMIS) Right 09/07/2019   Procedure: RIGHT BREAST RECONSTRUCTION WITH PLACEMENT OF TISSUE EXPANDER AND FLEX HD (ACELLULAR HYDRATED DERMIS);  Surgeon: Wallace Going, DO;  Location: [place];  Service: Plastics;  Laterality: Right;  . CARDIAC CATHETERIZATION    . Carpal tunnel R/L  1992  . Cervical spine fusion X2 since last visit    . COLONOSCOPY WITH PROPOFOL N/A 09/28/2015   Procedure: COLONOSCOPY WITH PROPOFOL;  Surgeon: Danie Binder, MD;  Location: [facility];  Service: Endoscopy;  Laterality: N/A;  procedure 1 cecum time in 1232  time out 1246   total time 14 mintes  . COLONOSCOPY WITH PROPOFOL N/A 04/05/2016   Procedure: COLONOSCOPY WITH PROPOFOL;  Surgeon: Danie Binder, MD;  [facility];  Service: Endoscopy;  Laterality: N/A;  1200  . 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 ELBOW SURGERY Right  tennis elbow release 2012  . ESOPHAGOGASTRODUODENOSCOPY  10/26/2012   Procedure: ESOPHAGOGASTRODUODENOSCOPY (EGD);  Surgeon: Inda Castle, MD;  Location: [place];  Service: Endoscopy;  Laterality: N/A;  . ESOPHAGOGASTRODUODENOSCOPY (EGD) WITH PROPOFOL N/A 09/28/2015   Procedure: ESOPHAGOGASTRODUODENOSCOPY (EGD) WITH PROPOFOL;  Surgeon: Danie Binder, MD;  Location: [facility];  Service: Endoscopy;  Laterality: N/A;  . EYE SURGERY  2018   corneal transplants bilateral  . KNEE ARTHROSCOPY Left   . LEFT HEART CATHETERIZATION WITH CORONARY ANGIOGRAM N/A 10/23/2012   Procedure: LEFT HEART CATHETERIZATION WITH CORONARY ANGIOGRAM;  Surgeon: Peter M Martinique, MD;  [facility] C[facility] LAB;  Service: Cardiovascular;  Laterality: N/A;  . MASTECTOMY W/ SENTINEL NODE BIOPSY Right 09/07/2019   Procedure: RIGHT MASTECTOMY WITH SENTINEL LYMPH NODE BIOPSY;  Surgeon: Jovita Kussmaul, MD;  Location: [facility];  Service: General;  Laterality: Right;  . OVARIAN CYST REMOVAL      prior to hysterectomy, right  . Ray cage fusion  1997  . SENTINEL LYMPH NODE BIOPSY  10/30/2002   right  . TONSILLECTOMY     at 49 old    Family Psychiatric History: see below  Family History:  Family History  Problem Relation Age of Onset  . Diabetes Mother   . Pancreatic cancer Mother 33  . Lung cancer Father   . Diabetes Father   . Heart disease Father   . Esophageal cancer Father 57       Beer drinker  . Brain cancer Brother 25  . Brain cancer Maternal Grandfather   . Heart disease Paternal Grandfather   . Cancer Maternal Uncle        unknown form of cancer; cigar smoker  . Lung cancer Maternal Grandmother   . Brain cancer Cousin        dx in his late 15s-40s  .  Schizophrenia Sister   . Lymphoma Niece 20  . Anesthesia problems Neg Hx   . Hypotension Neg Hx   . Malignant hyperthermia Neg Hx   . Pseudochol deficiency Neg Hx   . Colon cancer Neg Hx   . Colon polyps Neg Hx   . Crohn's disease Neg Hx   . Ulcerative colitis Neg Hx     Social History:  Social History   Socioeconomic History  . Marital status: Married    Spouse name: Not on file  . Number of children: 2  . Years of education: Not on file  . Highest education level: Not on file  Occupational History  . Occupation: Aeronautical engineer: [employer]    Comment: [employer]  Tobacco Use  . Smoking status: Former Smoker    Packs/day: 0.50    Years: 35.00    Pack years: 17.50    Types: Cigarettes    Start date: 07/07/1974    Quit date: 07/14/2019    Years since quitting: 0.4  . Smokeless tobacco: Never Used  Substance and Sexual Activity  . Alcohol use: Yes    Alcohol/week: 0.0 standard drinks    Comment: Rare( stopped July 26, 2014)  . Drug use: No  . Sexual activity: Not Currently    Birth control/protection: Surgical  Other Topics Concern  . Not on file  Social History Narrative  . Not on file   Social Determinants of Health   Financial Resource Strain:   . Difficulty of  Paying Living Expenses: Not on file  Food Insecurity:   . Worried About Charity fundraiser in the Last Year: Not on file  . Ran Out of Food in the Last Year: Not on file  Transportation Needs:   . Lack of Transportation (Medical): Not on file  . Lack of Transportation (Non-Medical): Not on file  Physical Activity:   . Days of Exercise per Week: Not on file  . Minutes of Exercise per Session: Not on file  Stress:   . Feeling of Stress : Not on file  Social Connections:   . Frequency of Communication with Friends and Family: Not on file  . Frequency of Social Gatherings with Friends and Family: Not on file  . Attends Religious Services: Not on file  . Active Member of Clubs or Organizations: Not on file  . Attends Archivist Meetings: Not on file  . Marital Status: Not on file    Allergies:  Allergies  Allergen Reactions  . Bee Venom Anaphylaxis  . Haloperidol Lactate Other (See Comments)    Convulsions   . Meperidine Hcl Hives  . Xifaxan [Rifaximin] Diarrhea and Other (See Comments)    Constantly felt the need to use the bathroom     Metabolic Disorder Labs: No results found for: HGBA1C, MPG No results found for: PROLACTIN Lab Results  Component Value Date   CHOL 207 (H) 10/23/2012   TRIG 319 (H) 10/23/2012   HDL 33 (L) 10/23/2012   CHOLHDL 6.3 10/23/2012   VLDL 64 (H) 10/23/2012   LDLCALC 110 (H) 10/23/2012   Lab Results  Component Value Date   TSH 0.706 09/22/2015    Therapeutic Level Labs: No results found for: LITHIUM No results found for: VALPROATE No components found for:  CBMZ  Current Medications: Current Outpatient Medications  Medication Sig Dispense Refill  . acetaminophen (TYLENOL) 500 MG tablet Take 1,000 mg by mouth every 6 (six) hours as needed for moderate pain or headache.    Marland Kitchen  ALPRAZolam (XANAX) 0.5 MG tablet TAKE 1 TABLET DAILY 90 tablet 1  . anastrozole (ARIMIDEX) 1 MG tablet TAKE 1 TABLET(1 MG) BY MOUTH DAILY 90 tablet 2  .  Calcium-Magnesium-Vitamin D (CALCIUM 500 PO) Take 1 tablet by mouth daily.     . cyclobenzaprine (FLEXERIL) 10 MG tablet Take 10 mg by mouth 3 (three) times daily as needed for muscle spasms.    . diclofenac (VOLTAREN) 75 MG EC tablet Take 75 mg by mouth 2 (two) times daily as needed for mild pain.    Marland Kitchen diclofenac sodium (VOLTAREN) 1 % GEL Apply 1 application topically 3 (three) times daily as needed (pain).     Marland Kitchen EPINEPHrine (EPI-PEN) 0.3 mg/0.3 mL DEVI Inject 0.3 mg into the muscle as needed (bee stings).     . fish oil-omega-3 fatty acids 1000 MG capsule Take 1 g by mouth every morning.     . hydrochlorothiazide (MICROZIDE) 12.5 MG capsule TAKE 1 CAPSULE DAILY (Patient taking differently: Take 12.5 mg by mouth daily. ) 90 capsule 3  . HYDROcodone-acetaminophen (NORCO/VICODIN) 5-325 MG tablet Take 1 tablet by mouth every 6 (six) hours as needed for moderate pain.    . hydroxychloroquine (PLAQUENIL) 200 MG tablet Take 400 mg by mouth daily.    Marland Kitchen leflunomide (ARAVA) 20 MG tablet Take 20 mg by mouth daily.    . metoprolol succinate (TOPROL-XL) 50 MG 24 hr tablet TAKE 1 TABLET TWICE A DAY  WITH OR IMMEDIATELY        FOLLOWING A MEAL. (Patient taking differently: Take 50 mg by mouth 2 (two) times daily. ) 180 tablet 3  . Multiple Vitamin (MULITIVITAMIN WITH MINERALS) TABS Take 1 tablet by mouth every morning.    Marland Kitchen NIFEdipine (PROCARDIA XL/NIFEDICAL-XL) 90 MG 24 hr tablet TAKE 1 TABLET DAILY (Patient taking differently: Take 90 mg by mouth daily. ) 90 tablet 3  . nitroGLYCERIN (NITROSTAT) 0.4 MG SL tablet Place 1 tablet (0.4 mg total) under the tongue every 5 (five) minutes x 3 doses as needed for chest pain. 25 tablet 3  . pantoprazole (PROTONIX) 40 MG tablet TAKE 1 TABLET DAILY 30 tablet 6   No current facility-administered medications for this visit.     Musculoskeletal: Strength & Muscle Tone: within normal limits Gait & Station: normal Patient leans: N/A  Psychiatric Specialty Exam: Review  of Systems  Musculoskeletal: Positive for arthralgias.  All other systems reviewed and are negative.   There were no vitals taken for this visit.There is no height or weight on file to calculate BMI.  General Appearance: Casual, Neat and Well Groomed  Eye Contact:  Good  Speech:  Clear and Coherent  Volume:  Normal  Mood:  Euthymic  Affect:  Appropriate and Congruent  Thought Process:  Goal Directed  Orientation:  Full (Time, Place, and Person)  Thought Content: WDL   Suicidal Thoughts:  No  Homicidal Thoughts:  No  Memory:  Immediate;   Good Recent;   Good Remote;   Good  Judgement:  Good  Insight:  Good  Psychomotor Activity:  Normal  Concentration:  Concentration: Good and Attention Span: Good  Recall:  Good  Fund of Knowledge: Good  Language: Good  Akathisia:  No  Handed:  Right  AIMS (if indicated): not done  Assets:  Communication Skills Desire for Improvement Resilience Social Support Talents/Skills  ADL's:  Intact  Cognition: WNL  Sleep:  Good   Screenings:   Assessment and Plan: This patient is a 59 year old female who  had gone through a very serious depressive episode few years ago after her first husband died.  Despite having recently gone through breast cancer surgery she is doing very well without depressive symptoms and only maintaining on a low dose of Xanax.  She will continue Xanax 0.5 mg daily for anxiety.  She will return to see me in 6 months   Levonne Spiller, MD 12/29/2019, 9:01 AM

## 2019-12-31 ENCOUNTER — Other Ambulatory Visit: Payer: Self-pay

## 2019-12-31 ENCOUNTER — Encounter (HOSPITAL_BASED_OUTPATIENT_CLINIC_OR_DEPARTMENT_OTHER)
Admission: RE | Disposition: A | Discharge status: Home / Self Care | Payer: Self-pay | Source: Home / Self Care | Attending: Plastic Surgery

## 2019-12-31 ENCOUNTER — Ambulatory Visit (HOSPITAL_BASED_OUTPATIENT_CLINIC_OR_DEPARTMENT_OTHER): Payer: Commercial Managed Care - PPO | Admitting: Certified Registered"

## 2019-12-31 ENCOUNTER — Encounter (HOSPITAL_BASED_OUTPATIENT_CLINIC_OR_DEPARTMENT_OTHER): Payer: Self-pay | Admitting: Plastic Surgery

## 2019-12-31 ENCOUNTER — Ambulatory Visit (HOSPITAL_BASED_OUTPATIENT_CLINIC_OR_DEPARTMENT_OTHER)
Admission: RE | Admit: 2019-12-31 | Discharge: 2019-12-31 | Disposition: A | Discharge status: Home / Self Care | Payer: Commercial Managed Care - PPO | Attending: Plastic Surgery | Admitting: Plastic Surgery

## 2019-12-31 DIAGNOSIS — I340 Nonrheumatic mitral (valve) insufficiency: Secondary | ICD-10-CM | POA: Insufficient documentation

## 2019-12-31 DIAGNOSIS — Z45811 Encounter for adjustment or removal of right breast implant: Secondary | ICD-10-CM | POA: Insufficient documentation

## 2019-12-31 DIAGNOSIS — E785 Hyperlipidemia, unspecified: Secondary | ICD-10-CM | POA: Insufficient documentation

## 2019-12-31 DIAGNOSIS — F329 Major depressive disorder, single episode, unspecified: Secondary | ICD-10-CM | POA: Diagnosis not present

## 2019-12-31 DIAGNOSIS — C50411 Malignant neoplasm of upper-outer quadrant of right female breast: Secondary | ICD-10-CM | POA: Diagnosis not present

## 2019-12-31 DIAGNOSIS — Z9011 Acquired absence of right breast and nipple: Secondary | ICD-10-CM | POA: Diagnosis present

## 2019-12-31 DIAGNOSIS — F419 Anxiety disorder, unspecified: Secondary | ICD-10-CM | POA: Insufficient documentation

## 2019-12-31 DIAGNOSIS — Z6830 Body mass index (BMI) 30.0-30.9, adult: Secondary | ICD-10-CM | POA: Insufficient documentation

## 2019-12-31 DIAGNOSIS — K219 Gastro-esophageal reflux disease without esophagitis: Secondary | ICD-10-CM | POA: Diagnosis not present

## 2019-12-31 DIAGNOSIS — Z853 Personal history of malignant neoplasm of breast: Secondary | ICD-10-CM | POA: Insufficient documentation

## 2019-12-31 DIAGNOSIS — M1990 Unspecified osteoarthritis, unspecified site: Secondary | ICD-10-CM | POA: Diagnosis not present

## 2019-12-31 DIAGNOSIS — E669 Obesity, unspecified: Secondary | ICD-10-CM | POA: Insufficient documentation

## 2019-12-31 DIAGNOSIS — Z87891 Personal history of nicotine dependence: Secondary | ICD-10-CM | POA: Insufficient documentation

## 2019-12-31 DIAGNOSIS — Z923 Personal history of irradiation: Secondary | ICD-10-CM | POA: Insufficient documentation

## 2019-12-31 DIAGNOSIS — Z9889 Other specified postprocedural states: Secondary | ICD-10-CM | POA: Diagnosis not present

## 2019-12-31 DIAGNOSIS — Z170 Estrogen receptor positive status [ER+]: Secondary | ICD-10-CM | POA: Diagnosis not present

## 2019-12-31 DIAGNOSIS — I10 Essential (primary) hypertension: Secondary | ICD-10-CM | POA: Diagnosis not present

## 2019-12-31 HISTORY — PX: REMOVAL OF TISSUE EXPANDER: SHX6324

## 2019-12-31 SURGERY — REMOVAL, TISSUE EXPANDER
Anesthesia: General | Site: Breast | Laterality: Right

## 2019-12-31 MED ORDER — CEFAZOLIN SODIUM 1 G IJ SOLR
INTRAMUSCULAR | Status: AC
  Filled 2019-12-31: qty 20

## 2019-12-31 MED ORDER — OXYCODONE HCL 5 MG PO TABS: 5.0000 mg | ORAL_TABLET | Freq: Once | ORAL | Status: DC | PRN

## 2019-12-31 MED ORDER — SODIUM CHLORIDE 0.9% FLUSH: 3.0000 mL | INTRAVENOUS | Status: DC | PRN

## 2019-12-31 MED ORDER — ONDANSETRON HCL 4 MG/2ML IJ SOLN
INTRAMUSCULAR | Status: DC | PRN
  Administered 2019-12-31: 4 mg via INTRAVENOUS

## 2019-12-31 MED ORDER — ACETAMINOPHEN 325 MG PO TABS: 650.0000 mg | ORAL_TABLET | ORAL | Status: DC | PRN

## 2019-12-31 MED ORDER — PROPOFOL 10 MG/ML IV BOLUS
INTRAVENOUS | Status: DC | PRN
  Administered 2019-12-31: 200 mg via INTRAVENOUS

## 2019-12-31 MED ORDER — CEPHALEXIN 500 MG PO CAPS: 500.0000 mg | ORAL_CAPSULE | Freq: Four times a day (QID) | ORAL | 0 refills | Status: AC

## 2019-12-31 MED ORDER — SODIUM CHLORIDE 0.9 % IV SOLN
INTRAVENOUS | Status: DC | PRN
  Administered 2019-12-31: 500 mL

## 2019-12-31 MED ORDER — KETOROLAC TROMETHAMINE 30 MG/ML IJ SOLN
INTRAMUSCULAR | Status: AC
  Filled 2019-12-31: qty 1

## 2019-12-31 MED ORDER — SODIUM CHLORIDE 0.9% FLUSH: 3.0000 mL | Freq: Two times a day (BID) | INTRAVENOUS | Status: DC

## 2019-12-31 MED ORDER — PROPOFOL 10 MG/ML IV BOLUS
INTRAVENOUS | Status: AC
  Filled 2019-12-31: qty 20

## 2019-12-31 MED ORDER — SODIUM CHLORIDE 0.9 % IV SOLN: 250.0000 mL | INTRAVENOUS | Status: DC | PRN

## 2019-12-31 MED ORDER — OXYCODONE HCL 5 MG/5ML PO SOLN: 5.0000 mg | Freq: Once | ORAL | Status: DC | PRN

## 2019-12-31 MED ORDER — MORPHINE SULFATE (PF) 4 MG/ML IV SOLN: 2.0000 mg | INTRAVENOUS | Status: DC | PRN

## 2019-12-31 MED ORDER — FENTANYL CITRATE (PF) 100 MCG/2ML IJ SOLN
INTRAMUSCULAR | Status: DC | PRN
  Administered 2019-12-31: 50 ug via INTRAVENOUS
  Administered 2019-12-31 (×3): 25 ug via INTRAVENOUS

## 2019-12-31 MED ORDER — HYDROMORPHONE HCL 1 MG/ML IJ SOLN
INTRAMUSCULAR | Status: AC
  Filled 2019-12-31: qty 0.5

## 2019-12-31 MED ORDER — DEXMEDETOMIDINE HCL 200 MCG/2ML IV SOLN
INTRAVENOUS | Status: DC | PRN
  Administered 2019-12-31: 8 ug via INTRAVENOUS
  Administered 2019-12-31: 4 ug via INTRAVENOUS
  Administered 2019-12-31: 8 ug via INTRAVENOUS

## 2019-12-31 MED ORDER — CEFAZOLIN SODIUM-DEXTROSE 2-4 GM/100ML-% IV SOLN
2.0000 g | INTRAVENOUS | Status: AC
  Administered 2019-12-31: 12:00:00 2 g via INTRAVENOUS

## 2019-12-31 MED ORDER — SODIUM CHLORIDE 0.9 % IV SOLN
INTRAVENOUS | Status: AC
  Filled 2019-12-31: qty 500000

## 2019-12-31 MED ORDER — MIDAZOLAM HCL 2 MG/2ML IJ SOLN
INTRAMUSCULAR | Status: AC
  Filled 2019-12-31: qty 2

## 2019-12-31 MED ORDER — DEXAMETHASONE SODIUM PHOSPHATE 10 MG/ML IJ SOLN
INTRAMUSCULAR | Status: AC
  Filled 2019-12-31: qty 1

## 2019-12-31 MED ORDER — ONDANSETRON HCL 4 MG/2ML IJ SOLN
INTRAMUSCULAR | Status: AC
  Filled 2019-12-31: qty 2

## 2019-12-31 MED ORDER — FENTANYL CITRATE (PF) 100 MCG/2ML IJ SOLN
INTRAMUSCULAR | Status: AC
  Filled 2019-12-31: qty 2

## 2019-12-31 MED ORDER — FENTANYL CITRATE (PF) 100 MCG/2ML IJ SOLN
25.0000 ug | INTRAMUSCULAR | Status: DC | PRN
  Administered 2019-12-31: 50 ug via INTRAVENOUS

## 2019-12-31 MED ORDER — DEXAMETHASONE SODIUM PHOSPHATE 10 MG/ML IJ SOLN
INTRAMUSCULAR | Status: DC | PRN
  Administered 2019-12-31: 5 mg via INTRAVENOUS

## 2019-12-31 MED ORDER — CHLORHEXIDINE GLUCONATE CLOTH 2 % EX PADS: 6.0000 | MEDICATED_PAD | Freq: Once | CUTANEOUS | Status: DC

## 2019-12-31 MED ORDER — LIDOCAINE 2% (20 MG/ML) 5 ML SYRINGE
INTRAMUSCULAR | Status: DC | PRN
  Administered 2019-12-31: 50 mg via INTRAVENOUS

## 2019-12-31 MED ORDER — BUPIVACAINE-EPINEPHRINE 0.25% -1:200000 IJ SOLN
INTRAMUSCULAR | Status: DC | PRN
  Administered 2019-12-31: 9 mL

## 2019-12-31 MED ORDER — HYDROCODONE-ACETAMINOPHEN 5-325 MG PO TABS: 1.0000 | ORAL_TABLET | Freq: Three times a day (TID) | ORAL | 0 refills | Status: AC | PRN

## 2019-12-31 MED ORDER — OXYCODONE HCL 5 MG PO TABS: 5.0000 mg | ORAL_TABLET | ORAL | Status: DC | PRN

## 2019-12-31 MED ORDER — PHENYLEPHRINE 40 MCG/ML (10ML) SYRINGE FOR IV PUSH (FOR BLOOD PRESSURE SUPPORT)
PREFILLED_SYRINGE | INTRAVENOUS | Status: AC
  Filled 2019-12-31: qty 10

## 2019-12-31 MED ORDER — HYDROMORPHONE HCL 1 MG/ML IJ SOLN
0.2500 mg | INTRAMUSCULAR | Status: DC | PRN
  Administered 2019-12-31: 0.5 mg via INTRAVENOUS

## 2019-12-31 MED ORDER — EPHEDRINE 5 MG/ML INJ
INTRAVENOUS | Status: AC
  Filled 2019-12-31: qty 10

## 2019-12-31 MED ORDER — ACETAMINOPHEN 650 MG RE SUPP: 650.0000 mg | RECTAL | Status: DC | PRN

## 2019-12-31 MED ORDER — DIAZEPAM 2 MG PO TABS: 2.0000 mg | ORAL_TABLET | Freq: Two times a day (BID) | ORAL | 0 refills | Status: AC | PRN

## 2019-12-31 MED ORDER — ONDANSETRON HCL 4 MG PO TABS: 4.0000 mg | ORAL_TABLET | Freq: Three times a day (TID) | ORAL | 0 refills | Status: AC | PRN

## 2019-12-31 MED ORDER — MIDAZOLAM HCL 5 MG/5ML IJ SOLN
INTRAMUSCULAR | Status: DC | PRN
  Administered 2019-12-31: 2 mg via INTRAVENOUS

## 2019-12-31 MED ORDER — PHENYLEPHRINE 40 MCG/ML (10ML) SYRINGE FOR IV PUSH (FOR BLOOD PRESSURE SUPPORT)
PREFILLED_SYRINGE | INTRAVENOUS | Status: DC | PRN
  Administered 2019-12-31 (×7): 80 ug via INTRAVENOUS

## 2019-12-31 MED ORDER — LACTATED RINGERS IV SOLN: INTRAVENOUS | Status: DC

## 2019-12-31 MED ORDER — EPHEDRINE SULFATE-NACL 50-0.9 MG/10ML-% IV SOSY
PREFILLED_SYRINGE | INTRAVENOUS | Status: DC | PRN
  Administered 2019-12-31 (×2): 5 mg via INTRAVENOUS

## 2019-12-31 MED ORDER — PROMETHAZINE HCL 25 MG/ML IJ SOLN: 6.2500 mg | INTRAMUSCULAR | Status: DC | PRN

## 2019-12-31 SURGICAL SUPPLY — 68 items
ADH SKN CLS APL DERMABOND .7 (GAUZE/BANDAGES/DRESSINGS) ×1
BAG DECANTER FOR FLEXI CONT (MISCELLANEOUS) ×2 IMPLANT
BINDER BREAST LRG (GAUZE/BANDAGES/DRESSINGS) IMPLANT
BINDER BREAST MEDIUM (GAUZE/BANDAGES/DRESSINGS) IMPLANT
BINDER BREAST XLRG (GAUZE/BANDAGES/DRESSINGS) IMPLANT
BINDER BREAST XXLRG (GAUZE/BANDAGES/DRESSINGS) IMPLANT
BIOPATCH RED 1 DISK 7.0 (GAUZE/BANDAGES/DRESSINGS) ×1 IMPLANT
BLADE SURG 15 STRL LF DISP TIS (BLADE) ×1 IMPLANT
BLADE SURG 15 STRL SS (BLADE) ×2
BNDG GAUZE ELAST 4 BULKY (GAUZE/BANDAGES/DRESSINGS) ×2 IMPLANT
CANISTER SUCT 1200ML W/VALVE (MISCELLANEOUS) ×2 IMPLANT
COVER BACK TABLE 60X90IN (DRAPES) ×2 IMPLANT
COVER MAYO STAND STRL (DRAPES) ×2 IMPLANT
COVER WAND RF STERILE (DRAPES) IMPLANT
DECANTER SPIKE VIAL GLASS SM (MISCELLANEOUS) IMPLANT
DERMABOND ADVANCED (GAUZE/BANDAGES/DRESSINGS) ×1
DERMABOND ADVANCED .7 DNX12 (GAUZE/BANDAGES/DRESSINGS) IMPLANT
DRAIN CHANNEL 19F RND (DRAIN) IMPLANT
DRAPE LAPAROSCOPIC ABDOMINAL (DRAPES) ×2 IMPLANT
DRSG OPSITE POSTOP 3X4 (GAUZE/BANDAGES/DRESSINGS) ×1 IMPLANT
DRSG OPSITE POSTOP 4X6 (GAUZE/BANDAGES/DRESSINGS) ×1 IMPLANT
DRSG PAD ABDOMINAL 8X10 ST (GAUZE/BANDAGES/DRESSINGS) ×4 IMPLANT
ELECT BLADE 4.0 EZ CLEAN MEGAD (MISCELLANEOUS)
ELECT COATED BLADE 2.86 ST (ELECTRODE) ×2 IMPLANT
ELECT REM PT RETURN 9FT ADLT (ELECTROSURGICAL) ×2
ELECTRODE BLDE 4.0 EZ CLN MEGD (MISCELLANEOUS) ×1 IMPLANT
ELECTRODE REM PT RTRN 9FT ADLT (ELECTROSURGICAL) ×1 IMPLANT
EVACUATOR SILICONE 100CC (DRAIN) IMPLANT
GAUZE SPONGE 4X4 12PLY STRL LF (GAUZE/BANDAGES/DRESSINGS) IMPLANT
GLOVE BIO SURGEON STRL SZ 6.5 (GLOVE) ×6 IMPLANT
GLOVE BIOGEL PI IND STRL 6.5 (GLOVE) IMPLANT
GLOVE BIOGEL PI IND STRL 7.0 (GLOVE) IMPLANT
GLOVE BIOGEL PI INDICATOR 6.5 (GLOVE) ×1
GLOVE BIOGEL PI INDICATOR 7.0 (GLOVE) ×1
GLOVE ECLIPSE 6.5 STRL STRAW (GLOVE) ×1 IMPLANT
GOWN STRL REUS W/ TWL LRG LVL3 (GOWN DISPOSABLE) ×2 IMPLANT
GOWN STRL REUS W/TWL LRG LVL3 (GOWN DISPOSABLE) ×4
IV NS 1000ML (IV SOLUTION)
IV NS 1000ML BAXH (IV SOLUTION) IMPLANT
NDL HYPO 25X1 1.5 SAFETY (NEEDLE) ×1 IMPLANT
NDL SAFETY ECLIPSE 18X1.5 (NEEDLE) ×1 IMPLANT
NEEDLE HYPO 18GX1.5 SHARP (NEEDLE) ×2
NEEDLE HYPO 25X1 1.5 SAFETY (NEEDLE) ×2 IMPLANT
PACK BASIN DAY SURGERY FS (CUSTOM PROCEDURE TRAY) ×2 IMPLANT
PENCIL SMOKE EVACUATOR (MISCELLANEOUS) ×2 IMPLANT
PIN SAFETY STERILE (MISCELLANEOUS) IMPLANT
SLEEVE SCD COMPRESS KNEE MED (MISCELLANEOUS) ×2 IMPLANT
SPONGE LAP 18X18 RF (DISPOSABLE) ×4 IMPLANT
STRIP SUTURE WOUND CLOSURE 1/2 (MISCELLANEOUS) IMPLANT
SUT MNCRL AB 3-0 PS2 18 (SUTURE) IMPLANT
SUT MNCRL AB 4-0 PS2 18 (SUTURE) ×3 IMPLANT
SUT MON AB 3-0 SH 27 (SUTURE) ×4
SUT MON AB 3-0 SH27 (SUTURE) ×1 IMPLANT
SUT MON AB 5-0 PS2 18 (SUTURE) ×1 IMPLANT
SUT PDS 3-0 CT2 (SUTURE)
SUT PDS AB 2-0 CT2 27 (SUTURE) IMPLANT
SUT PDS II 3-0 CT2 27 ABS (SUTURE) IMPLANT
SUT SILK 3 0 PS 1 (SUTURE) IMPLANT
SUT VIC AB 3-0 SH 27 (SUTURE) ×2
SUT VIC AB 3-0 SH 27X BRD (SUTURE) ×1 IMPLANT
SUT VICRYL 4-0 PS2 18IN ABS (SUTURE) ×1 IMPLANT
SYR BULB IRRIGATION 50ML (SYRINGE) ×1 IMPLANT
SYR CONTROL 10ML LL (SYRINGE) ×1 IMPLANT
TOWEL GREEN STERILE FF (TOWEL DISPOSABLE) ×4 IMPLANT
TRAY DSU PREP LF (CUSTOM PROCEDURE TRAY) ×2 IMPLANT
TUBE CONNECTING 20X1/4 (TUBING) ×2 IMPLANT
UNDERPAD 30X36 HEAVY ABSORB (UNDERPADS AND DIAPERS) ×3 IMPLANT
YANKAUER SUCT BULB TIP NO VENT (SUCTIONS) ×2 IMPLANT

## 2019-12-31 NOTE — Discharge Instructions (Signed)
Post Anesthesia Home Care Instructions  Activity: Get plenty of rest for the remainder of the day. A responsible individual must stay with you for 24 hours following the procedure.  For the next 24 hours, DO NOT: -Drive a car -Paediatric nurse -Drink alcoholic beverages -Take any medication unless instructed by your physician -Make any legal decisions or sign important papers.  Meals: Start with liquid foods such as gelatin or soup. Progress to regular foods as tolerated. Avoid greasy, spicy, heavy foods. If nausea and/or vomiting occur, drink only clear liquids until the nausea and/or vomiting subsides. Call your physician if vomiting continues.  Special Instructions/Symptoms: Your throat may feel dry or sore from the anesthesia or the breathing tube placed in your throat during surgery. If this causes discomfort, gargle with warm salt water. The discomfort should disappear within 24 hours.  If you had a scopolamine patch placed behind your ear for the management of post- operative nausea and/or vomiting:  1. The medication in the patch is effective for 72 hours, after which it should be removed.  Wrap patch in a tissue and discard in the trash. Wash hands thoroughly with soap and water. 2. You may remove the patch earlier than 72 hours if you experience unpleasant side effects which may include dry mouth, dizziness or visual disturbances. 3. Avoid touching the patch. Wash your hands with soap and water after contact with the patch.    About my Dampier-Pratt Bulb Drain  What is a [place]-Pratt bulb? A [place]-Pratt is a soft, round device used to collect drainage. It is connected to a long, thin drainage catheter, which is held in place by one or two small stiches near your surgical incision site. When the bulb is squeezed, it forms a vacuum, forcing the drainage to empty into the bulb.  Emptying the [place]-Pratt bulb- To empty the bulb: 1. Release the plug on the top of the  bulb. 2. Pour the bulb's contents into a measuring container which your nurse will provide. 3. Record the time emptied and amount of drainage. Empty the drain(s) as often as your     doctor or nurse recommends.  Date                  Time                    Amount (Drain 1)                 Amount (Drain 2)  _____________________________________________________________________  _____________________________________________________________________  _____________________________________________________________________  _____________________________________________________________________  _____________________________________________________________________  _____________________________________________________________________  _____________________________________________________________________  _____________________________________________________________________  Squeezing the [place]-Pratt Bulb- To squeeze the bulb: 1. Make sure the plug at the top of the bulb is open. 2. Squeeze the bulb tightly in your fist. You will hear air squeezing from the bulb. 3. Replace the plug while the bulb is squeezed. 4. Use a safety pin to attach the bulb to your clothing. This will keep the catheter from     pulling at the bulb insertion site.  When to call your doctor- Call your doctor if:  Drain site becomes red, swollen or hot.  You have a fever greater than 101 degrees F.  There is oozing at the drain site.  Drain falls out (apply a guaze bandage over the drain hole and secure it with tape).  Drainage increases daily not related to activity patterns. (You will usually have more drainage when you are active than when you are resting.)  Drainage has a  bad odor.   Post Anesthesia Home Care Instructions  Activity: Get plenty of rest for the remainder of the day. A responsible individual must stay with you for 24 hours following the procedure.  For the next 24 hours, DO NOT: -Drive  a car -Paediatric nurse -Drink alcoholic beverages -Take any medication unless instructed by your physician -Make any legal decisions or sign important papers.  Meals: Start with liquid foods such as gelatin or soup. Progress to regular foods as tolerated. Avoid greasy, spicy, heavy foods. If nausea and/or vomiting occur, drink only clear liquids until the nausea and/or vomiting subsides. Call your physician if vomiting continues.  Special Instructions/Symptoms: Your throat may feel dry or sore from the anesthesia or the breathing tube placed in your throat during surgery. If this causes discomfort, gargle with warm salt water. The discomfort should disappear within 24 hours.  If you had a scopolamine patch placed behind your ear for the management of post- operative nausea and/or vomiting:  1. The medication in the patch is effective for 72 hours, after which it should be removed.  Wrap patch in a tissue and discard in the trash. Wash hands thoroughly with soap and water. 2. You may remove the patch earlier than 72 hours if you experience unpleasant side effects which may include dry mouth, dizziness or visual disturbances. 3. Avoid touching the patch. Wash your hands with soap and water after contact with the patch.    INSTRUCTIONS FOR AFTER SURGERY   You will likely have some questions about what to expect following your operation.  The following information will help you and your family understand what to expect when you are discharged from the hospital.  Following these guidelines will help ensure a smooth recovery and reduce risks of complications.  Postoperative instructions include information on: diet, wound care, medications and physical activity.  AFTER SURGERY Expect to go home after the procedure.  In some cases, you may need to spend one night in the hospital for observation.  DIET This surgery does not require a specific diet.  However, I have to mention that the healthier  you eat the better your body can start healing. It is important to increasing your protein intake.  This means limiting the foods with added sugar.  Focus on fruits and vegetables and some meat.  If you have any liposuction during your procedure be sure to drink water.  If your urine is bright yellow, then it is concentrated, and you need to drink more water.  As a general rule after surgery, you should have 8 ounces of water every hour while awake.  If you find you are persistently nauseated or unable to take in liquids let us know.  NO TOBACCO USE or EXPOSURE.  This will slow your healing process and increase the risk of a wound.  WOUND CARE If you don't have a drain: You can shower the day after surgery.  Use fragrance free soap.  Dial, [place], [place] and Cetaphil are usually mild on the skin.  If you have a drain: Clean with baby wipes until the drain is removed.   If you have steri-strips / tape directly attached to your skin leave them in place. It is OK to get these wet.  No baths, pools or hot tubs for two weeks. We close your incision to leave the smallest and best-looking scar. No ointment or creams on your incisions until given the go ahead.  Especially not Neosporin (Too many skin reactions with  this one).  A few weeks after surgery you can use Mederma and start massaging the scar. We ask you to wear your binder or sports bra for the first 6 weeks around the clock, including while sleeping. This provides added comfort and helps reduce the fluid accumulation at the surgery site.  ACTIVITY No heavy lifting until cleared by the doctor.  It is OK to walk and climb stairs. In fact, moving your legs is very important to decrease your risk of a blood clot.  It will also help keep you from getting deconditioned.  Every 1 to 2 hours get up and walk for 5 minutes. This will help with a quicker recovery back to normal.  Let pain be your guide so you don't do too much.  NO, you cannot do the spring cleaning  and don't plan on taking care of anyone else.  This is your time for TLC.   WORK Everyone returns to work at different times. As a rough guide, most people take at least 1 - 2 weeks off prior to returning to work. If you need documentation for your job, bring the forms to your postoperative follow up visit.  DRIVING Arrange for someone to bring you home from the hospital.  You may be able to drive a few days after surgery but not while taking any narcotics or valium.  BOWEL MOVEMENTS Constipation can occur after anesthesia and while taking pain medication.  It is important to stay ahead for your comfort.  We recommend taking Milk of Magnesia (2 tablespoons; twice a day) while taking the pain pills.  SEROMA This is fluid your body tried to put in the surgical site.  This is normal but if it creates excessive pain and swelling let us know.  It usually decreases in a few weeks.  MEDICATIONS and PAIN CONTROL At your preoperative visit for you history and physical you were given the following medications: 1. An antibiotic: Start this medication when you get home and take according to the instructions on the bottle. 2. Zofran 4 mg:  This is to treat nausea and vomiting.  You can take this every 6 hours as needed and only if needed. 3. Norco (hydrocodone/acetaminophen) 5/325 mg:  This is only to be used after you have taken the motrin or the tylenol. Every 8 hours as needed. Over the counter Medication to take: 4. Ibuprofen (Motrin) 600 mg:  Take this every 6 hours.  If you have additional pain then take 500 mg of the tylenol.  Only take the Norco after you have tried these two. 5. Miralax or stool softener of choice: Take this according to the bottle if you take the [place] Call your surgeon's office if any of the following occur: . Fever 101 degrees F or greater . Excessive bleeding or fluid from the incision site. . Pain that increases over time without aid from the medications .  Redness, warmth, or pus draining from incision sites . Persistent nausea or inability to take in liquids . Severe misshapen area that underwent the operation.

## 2019-12-31 NOTE — Anesthesia Procedure Notes (Signed)
Procedure Name: LMA Insertion Date/Time: 12/31/2019 11:55 AM Performed by: Gwyndolyn Saxon, CRNA Pre-anesthesia Checklist: Patient identified, Emergency Drugs available, Suction available and Patient being monitored Patient Re-evaluated:Patient Re-evaluated prior to induction Oxygen Delivery Method: Circle system utilized Preoxygenation: Pre-oxygenation with 100% oxygen Induction Type: IV induction Ventilation: Mask ventilation without difficulty LMA: LMA inserted LMA Size: 4.0 Number of attempts: 1 Placement Confirmation: positive ETCO2 and breath sounds checked- equal and bilateral Tube secured with: Tape Dental Injury: Teeth and Oropharynx as per pre-operative assessment

## 2019-12-31 NOTE — Anesthesia Postprocedure Evaluation (Signed)
Anesthesia Post Note  Patient: Rachel Sutton  Procedure(s) Performed: REMOVAL OF TISSUE EXPANDER AND EXCESS SKIN (Right Breast)     Patient location during evaluation: PACU Anesthesia Type: General Level of consciousness: awake and alert Pain management: pain level controlled Vital Signs Assessment: post-procedure vital signs reviewed and stable Respiratory status: spontaneous breathing, nonlabored ventilation and respiratory function stable Cardiovascular status: blood pressure returned to baseline and stable Postop Assessment: no apparent nausea or vomiting Anesthetic complications: no    Last Vitals:  Vitals:   12/31/19 1400 12/31/19 1415  BP: 127/71 128/77  Pulse: 78 87  Resp: 16 16  Temp:    SpO2: 97% 92%    Last Pain:  Vitals:   12/31/19 1420  TempSrc:   PainSc: [place]

## 2019-12-31 NOTE — Transfer of Care (Signed)
Immediate Anesthesia Transfer of Care Note  Patient: Rachel Sutton  Procedure(s) Performed: REMOVAL OF TISSUE EXPANDER AND EXCESS SKIN (Right Breast)  Patient Location: PACU  Anesthesia Type:General  Level of Consciousness: sedated and responds to stimulation  Airway & Oxygen Therapy: Patient Spontanous Breathing and Patient connected to face mask oxygen  Post-op Assessment: Report given to RN, Post -op Vital signs reviewed and stable and Patient moving all extremities  Post vital signs: Reviewed and stable  Last Vitals:  Vitals Value Taken Time  BP 115/65 12/31/19 1323  Temp    Pulse 84 12/31/19 1325  Resp 16 12/31/19 1325  SpO2 100 % 12/31/19 1325  Vitals shown include unvalidated device data.  Last Pain:  Vitals:   12/31/19 1021  TempSrc: Tympanic  PainSc: 0-No pain         Complications: No apparent anesthesia complications

## 2019-12-31 NOTE — Addendum Note (Signed)
Addended by: Wallace Going on: 12/31/2019 04:13 PM   Modules accepted: Orders

## 2019-12-31 NOTE — Anesthesia Preprocedure Evaluation (Signed)
Anesthesia Evaluation  Patient identified by MRN, date of birth, ID band Patient awake    Reviewed: Allergy & Precautions, NPO status , Patient's Chart, lab work & pertinent test results, reviewed documented beta blocker date and time   Airway Mallampati: III  TM Distance: >3 FB Neck ROM: Full    Dental no notable dental hx. (+) Teeth Intact, Dental Advisory Given   Pulmonary neg pulmonary ROS, Patient abstained from smoking., former smoker,    Pulmonary exam normal breath sounds clear to auscultation       Cardiovascular hypertension, Pt. on home beta blockers and Pt. on medications Normal cardiovascular exam+ Valvular Problems/Murmurs MVP  Rhythm:Regular Rate:Normal  HLD  TTE 2018 - Left ventricle: The cavity size was normal. Wall thickness was normal. Systolic function was normal. The estimated ejection fraction was in the range of 60% to 65%. Wall motion was normal; there were no regional wall motion abnormalities. Doppler parameters are consistent with abnormal left ventricular relaxation (grade 1 diastolic dysfunction). - Mitral valve: Mild bileaflet prolapse with mild central   regurgitation.   Neuro/Psych PSYCHIATRIC DISORDERS Anxiety Depression    GI/Hepatic Neg liver ROS, GERD  Controlled and Medicated,  Endo/Other  negative endocrine ROS  Renal/GU negative Renal ROS  negative genitourinary   Musculoskeletal  (+) Arthritis ,   Abdominal (+) + obese,   Peds  Hematology negative hematology ROS (+)   Anesthesia Other Findings   Reproductive/Obstetrics                             Anesthesia Physical  Anesthesia Plan  ASA: II  Anesthesia Plan: General   Post-op Pain Management:    Induction: Intravenous  PONV Risk Score and Plan: 3 and Midazolam, Dexamethasone, Ondansetron and Treatment may vary due to age or medical condition  Airway Management Planned: LMA  Additional  Equipment:   Intra-op Plan:   Post-operative Plan: Extubation in OR  Informed Consent: I have reviewed the patients History and Physical, chart, labs and discussed the procedure including the risks, benefits and alternatives for the proposed anesthesia with the patient or authorized representative who has indicated his/her understanding and acceptance.     Dental advisory given  Plan Discussed with: CRNA  Anesthesia Plan Comments:         Anesthesia Quick Evaluation

## 2019-12-31 NOTE — Interval H&P Note (Signed)
History and Physical Interval Note:  12/31/2019 11:32 AM  Rachel Sutton  has presented today for surgery, with the diagnosis of status post mastectomy.  The various methods of treatment have been discussed with the patient and family. After consideration of risks, benefits and other options for treatment, the patient has consented to  Procedure(s) with comments: REMOVAL OF TISSUE EXPANDER AND EXCESS SKIN (Right) - 1 hour as a surgical intervention.  The patient's history has been reviewed, patient examined, no change in status, stable for surgery.  I have reviewed the patient's chart and labs.  Questions were answered to the patient's satisfaction.     Loel Lofty Elize Pinon

## 2019-12-31 NOTE — Op Note (Signed)
DATE OF OPERATION: 12/31/2019  LOCATION: [facility]  PREOPERATIVE DIAGNOSIS: Acquired absence of right breast  POSTOPERATIVE DIAGNOSIS: Same  PROCEDURE: Removal of right breast expander and complete capsulectomy  SURGEON: Lyndee Leo Sanger Giovanni Biby, DO  ASSISTANT: Phoebe Sharps, PA  EBL: 10 cc  CONDITION: Stable  COMPLICATIONS: None  INDICATION: The patient, Rachel Sutton, is a 59 y.o. female born on 03/28/1961, is here for treatment of acquired absence of right breast after treatment with a mastectomy for breast cancer.   PROCEDURE DETAILS:  The patient was seen prior to surgery and marked.  The IV antibiotics were given. The patient was taken to the operating room and given a general anesthetic. A standard time out was performed and all information was confirmed by those in the room. SCDs were placed.   The patient was prepped and draped in the usual sterile fashion.  1% lidocaine with epinephrine was injected at the incision site.  The area for excision was marked.  The 10 blade was used to make an incision in elliptical fashion to remove the excess skin.  The Bovie was used to obtain hemostasis as I dissected down to the expander.  The skin hooks and retraction was used to excise the capsule.  This was done to help prevent seroma formation.  Once the capsule was removed the chest wall was examined for any bleeding.  Hemostasis was achieved.  The excess skin was marked and excised.  A drain was placed and secured to the chest wall with a 4-0 silk.  The pectoralis major muscle was tacked to the chest wall with 3-0 Vicryl.  This was done to help eliminate or decrease the window shading of the muscle.  The deep layers were closed with 3-0 Monocryl.  The 4 and 5-0 Monocryl was then used to close the skin.  Dermabond was applied.  A Biopatch was placed around the drain with gauze.  ABDs and a breast binder was applied to the chest.  The patient was allowed to wake up and taken to  recovery room in stable condition at the end of the case. The family was notified at the end of the case.    The advanced practice practitioner (APP) assisted throughout the case.  The APP was essential in retraction and counter traction when needed to make the case progress smoothly.  This retraction and assistance made it possible to see the tissue plans for the procedure.  The assistance was needed for blood control, tissue re-approximation and assisted with closure of the incision site.  The [place] was signed into law in 2016 which includes the topic of electronic health records.  This provides immediate access to information in MyChart.  This includes consultation notes, operative notes, office notes, lab results and pathology reports.  If you have any questions about what you read please let us know at your next visit or call us at the office.  We are right here with you.

## 2020-01-01 ENCOUNTER — Encounter: Payer: Self-pay | Admitting: *Deleted

## 2020-01-04 LAB — SURGICAL PATHOLOGY

## 2020-01-07 ENCOUNTER — Telehealth: Payer: Self-pay

## 2020-01-07 NOTE — Telephone Encounter (Signed)

## 2020-01-08 ENCOUNTER — Encounter: Payer: Self-pay | Admitting: Plastic Surgery

## 2020-01-08 ENCOUNTER — Other Ambulatory Visit: Payer: Self-pay

## 2020-01-08 ENCOUNTER — Ambulatory Visit (INDEPENDENT_AMBULATORY_CARE_PROVIDER_SITE_OTHER): Payer: Commercial Managed Care - PPO | Admitting: Plastic Surgery

## 2020-01-08 VITALS — BP 137/86 | HR 94 | Temp 97.3°F | Ht 63.0 in | Wt 169.4 lb

## 2020-01-08 DIAGNOSIS — Z9011 Acquired absence of right breast and nipple: Secondary | ICD-10-CM

## 2020-01-08 NOTE — Progress Notes (Signed)
   Subjective:    Patient ID: Rachel Sutton, female    DOB: [birth year], 59 y.o.   MRN: [id number]  The patient is a 59 year old female here for follow-up.  She had a mastectomy and radiation.  She then had expander placed and decided for removal.  She underwent removal last week.  Drain is in place and draining minimally.  She is very happy with her decision so far.  She seems to be healing very well.  There is no sign of infection, hematoma or seroma.     Review of Systems  Constitutional: Negative.   HENT: Negative.   Respiratory: Negative.   Cardiovascular: Negative.   Musculoskeletal: Negative.        Objective:   Physical Exam Vitals and nursing note reviewed.  Constitutional:      Appearance: Normal appearance.  HENT:     Head: Normocephalic and atraumatic.  Cardiovascular:     Rate and Rhythm: Normal rate.     Pulses: Normal pulses.  Pulmonary:     Effort: Pulmonary effort is normal.  Neurological:     General: No focal deficit present.     Mental Status: She is alert and oriented to person, place, and time.  Psychiatric:        Mood and Affect: Mood normal.        Behavior: Behavior normal.        Assessment & Plan:     ICD-10-CM   1. Acquired absence of right breast  Z90.11     The drain was removed.  She can shower tomorrow.  No heavy lifting.  She is going to follow-up with [facility] in the next few weeks.  I had like to see her back in 1 month.    The [place] was signed into law in 2016 which includes the topic of electronic health records.  This provides immediate access to information in MyChart.  This includes consultation notes, operative notes, office notes, lab results and pathology reports.  If you have any questions about what you read please let us know at your next visit or call us at the office.  We are right here with you.

## 2020-01-12 ENCOUNTER — Other Ambulatory Visit: Payer: Self-pay | Admitting: Hematology

## 2020-01-12 DIAGNOSIS — Z170 Estrogen receptor positive status [ER+]: Secondary | ICD-10-CM

## 2020-01-12 DIAGNOSIS — C50411 Malignant neoplasm of upper-outer quadrant of right female breast: Secondary | ICD-10-CM

## 2020-01-19 ENCOUNTER — Encounter: Payer: Commercial Managed Care - PPO | Admitting: Plastic Surgery

## 2020-01-22 ENCOUNTER — Other Ambulatory Visit: Payer: Self-pay

## 2020-01-22 MED ORDER — HYDROCHLOROTHIAZIDE 12.5 MG PO CAPS: 12.5000 mg | ORAL_CAPSULE | Freq: Every day | ORAL | 3 refills | Status: DC

## 2020-01-22 MED ORDER — PANTOPRAZOLE SODIUM 40 MG PO TBEC: 40.0000 mg | DELAYED_RELEASE_TABLET | Freq: Every day | ORAL | 3 refills | Status: DC

## 2020-01-22 NOTE — Telephone Encounter (Signed)
Refilled rx's

## 2020-01-22 NOTE — Telephone Encounter (Signed)
Pt is asking if mail-in prescription to [facility] can be resubmitted: HCTZ #90 Protonics #90  If needed you may reach Pt at [phone]  Thanks renee

## 2020-01-25 ENCOUNTER — Encounter: Payer: Self-pay | Admitting: Surgical

## 2020-01-25 ENCOUNTER — Telehealth: Payer: Self-pay

## 2020-01-25 ENCOUNTER — Other Ambulatory Visit: Payer: Self-pay

## 2020-01-25 ENCOUNTER — Ambulatory Visit (INDEPENDENT_AMBULATORY_CARE_PROVIDER_SITE_OTHER): Payer: Commercial Managed Care - PPO | Admitting: Surgical

## 2020-01-25 VITALS — BP 137/85 | HR 98 | Temp 97.8°F | Ht 63.0 in | Wt 169.0 lb

## 2020-01-25 DIAGNOSIS — Z170 Estrogen receptor positive status [ER+]: Secondary | ICD-10-CM

## 2020-01-25 DIAGNOSIS — C50411 Malignant neoplasm of upper-outer quadrant of right female breast: Secondary | ICD-10-CM

## 2020-01-25 DIAGNOSIS — Z9011 Acquired absence of right breast and nipple: Secondary | ICD-10-CM

## 2020-01-25 DIAGNOSIS — T8131XA Disruption of external operation (surgical) wound, not elsewhere classified, initial encounter: Secondary | ICD-10-CM

## 2020-01-25 MED ORDER — DOXYCYCLINE HYCLATE 100 MG PO TABS: 100.0000 mg | ORAL_TABLET | Freq: Two times a day (BID) | ORAL | 0 refills | Status: AC

## 2020-01-25 NOTE — Progress Notes (Signed)
Subjective:     Patient ID: Rachel Sutton, female    DOB: 26-Jul-1961, 59 y.o.   MRN: [id number]  Chief Complaint  Patient presents with  . Follow-up    follow up visit for reconstruction of the right breast     HPI: The patient is a 59 y.o. female here for follow-up after removal of right breast expander on 12/31/2019 with Dr. Marla Roe.  She has a history of radiation after right breast lumpectomy in 2003.  She reports that she noticed the wound started develop after switching from wearing her sports bra to a bra with silicone implant from [facility].  Reports initially that she noticed a large amount of fluid that drained from the right breast wound.  Initially, it was lighter in color and then she noticed a little bit of bright red blood.  It has since improved and is now mostly pink-tinged fluid.  It does not have an odor.  She has not had any fevers or chills or nausea or vomiting.   The medial aspect of her incision is well-healed.  She has been receiving assistance from her husband.  No other complaints.  Review of Systems  Constitutional: Negative for chills, fatigue and fever.  Respiratory: Negative.   Cardiovascular: Negative.   Skin: Positive for color change and wound. Negative for pallor and rash.   Objective:   Vital Signs BP 137/85 (BP Location: Left Arm, Patient Position: Sitting, Cuff Size: Large)   Pulse 98   Temp 97.8 F (36.6 C) (Temporal)   Ht 5\' 3"  (1.6 m)   Wt 169 lb (76.7 kg)   SpO2 97%   BMI 29.94 kg/m  Vital Signs and Nursing Note Reviewed Chaperone present Physical Exam  Constitutional: She is oriented to person, place, and time and well-developed, well-nourished, and in no distress.  HENT:  Head: Normocephalic and atraumatic.  Cardiovascular: Normal rate.  Pulmonary/Chest: Effort normal.    Wound dehiscence noted at lateral incision, 5 x 0.5 x 0.5 cm. Periwound slightly erythematous, no purulence noted. Mild serosanguinous drainage.  Medial incision is well healed.   No cellulitis noted. Mild TTP. No increased warmth noted.  Neurological: She is alert and oriented to person, place, and time. Gait normal.  Skin: Skin is warm and dry. She is not diaphoretic. There is erythema.  Psychiatric: Mood and affect normal.    Assessment/Plan:     ICD-10-CM   1. Acquired absence of right breast  Z90.11   2. Status post right breast reconstruction  Z98.890   3. Malignant neoplasm of upper-outer quadrant of right breast in female, estrogen receptor positive ([place])  C50.411    Z17.0    Rachel Sutton is a 59 year old female here for follow-up after removal of right breast expander.  Based on her history it seems as if she developed a small seroma which put pressure on the right breast incision and she had a seroma that drained.  Due to her history of radiation damage she is already at increased risk of wound dehiscence.  It does not appear infected today, no foul odor or cellulitis noted  Recommend applying vaseline, 4x4 gauze, ABD pad daily. Recommend returning to wearing sports bra for a minimum of a few more weeks due to possibility of post-op swelling continuing.  Rx for ppx abx sent to pharmacy.  Call with questions or concerns, follow up in 1 week.  Return precautions provided, patient understood and agreed.  Pictures were obtained of the patient and  placed in the chart with the patient's or guardian's permission.  Carola Rhine Philamena Kramar, PA-C 01/25/2020, 9:38 AM

## 2020-01-25 NOTE — Telephone Encounter (Signed)
Faxed medical supply order to [facility]

## 2020-01-26 ENCOUNTER — Telehealth: Payer: Self-pay

## 2020-01-26 ENCOUNTER — Telehealth: Payer: Self-pay | Admitting: Nurse Practitioner

## 2020-01-26 ENCOUNTER — Encounter: Payer: Commercial Managed Care - PPO | Admitting: Plastic Surgery

## 2020-01-26 ENCOUNTER — Inpatient Hospital Stay: Payer: Commercial Managed Care - PPO | Attending: Hematology | Admitting: Nurse Practitioner

## 2020-01-26 NOTE — Telephone Encounter (Signed)
PROGRESS NOTE: Per Cira Rue NP sent over schedule message to get patient rescheduled for Survivorship PHONE VISIT that was missed today.

## 2020-01-26 NOTE — Telephone Encounter (Signed)
Patient was called twice to connect for virtual/phone survivorship visit today. No answer x2. Left voicemail to return my call, phone number provided. She is being rescheduled at this time.   Cira Rue, NP

## 2020-01-27 ENCOUNTER — Telehealth: Payer: Self-pay

## 2020-01-27 NOTE — Telephone Encounter (Signed)
PROGRESS NOTE:  sent over another schedule message to get patient rescheduled for Survivorship PHONE VISIT that was missed.

## 2020-02-02 ENCOUNTER — Ambulatory Visit: Payer: Commercial Managed Care - PPO | Admitting: Plastic Surgery

## 2020-02-04 ENCOUNTER — Encounter: Payer: Commercial Managed Care - PPO | Admitting: Nurse Practitioner

## 2020-02-08 ENCOUNTER — Other Ambulatory Visit: Payer: Self-pay | Admitting: Cardiology

## 2020-02-09 ENCOUNTER — Encounter: Payer: Self-pay | Admitting: Plastic Surgery

## 2020-02-09 ENCOUNTER — Other Ambulatory Visit: Payer: Self-pay

## 2020-02-09 ENCOUNTER — Ambulatory Visit (INDEPENDENT_AMBULATORY_CARE_PROVIDER_SITE_OTHER): Payer: Commercial Managed Care - PPO | Admitting: Plastic Surgery

## 2020-02-09 DIAGNOSIS — S21001A Unspecified open wound of right breast, initial encounter: Secondary | ICD-10-CM

## 2020-02-09 MED ORDER — HYDROCODONE-ACETAMINOPHEN 5-325 MG PO TABS: 1.0000 | ORAL_TABLET | Freq: Three times a day (TID) | ORAL | 0 refills | Status: AC | PRN

## 2020-02-09 MED ORDER — DOXYCYCLINE HYCLATE 100 MG PO TABS: 100.0000 mg | ORAL_TABLET | Freq: Two times a day (BID) | ORAL | 0 refills | Status: AC

## 2020-02-09 NOTE — H&P (View-Only) (Signed)
The patient is a 59 year old female here for follow-up after undergoing removal of her expander and closure.  She has had radiation on that right breast.  That is likely contributing to the slow healing process.  She has a 1 x 5 cm opening.  I feel it is best to washout the pocket and try to reclose it.  The patient is in agreement.  She otherwise feels fine and not having any fevers or pain.  We will plan for right breast debridement and closure.  Pictures were obtained of the patient and placed in the chart with the patient's or guardian's permission.

## 2020-02-09 NOTE — Progress Notes (Signed)
The patient is a 59 year old female here for follow-up after undergoing removal of her expander and closure.  She has had radiation on that right breast.  That is likely contributing to the slow healing process.  She has a 1 x 5 cm opening.  I feel it is best to washout the pocket and try to reclose it.  The patient is in agreement.  She otherwise feels fine and not having any fevers or pain.  We will plan for right breast debridement and closure.  Pictures were obtained of the patient and placed in the chart with the patient's or guardian's permission.

## 2020-02-10 ENCOUNTER — Telehealth: Payer: Self-pay | Admitting: Nurse Practitioner

## 2020-02-10 NOTE — Telephone Encounter (Signed)
R/s appt per 2/23 sch message - unable to reach pt - left message with appt date and time

## 2020-02-11 ENCOUNTER — Encounter (HOSPITAL_BASED_OUTPATIENT_CLINIC_OR_DEPARTMENT_OTHER): Payer: Self-pay | Admitting: Plastic Surgery

## 2020-02-11 ENCOUNTER — Other Ambulatory Visit: Payer: Self-pay

## 2020-02-13 ENCOUNTER — Other Ambulatory Visit (HOSPITAL_COMMUNITY)
Admission: RE | Admit: 2020-02-13 | Discharge: 2020-02-13 | Disposition: A | Discharge status: Home / Self Care | Payer: Commercial Managed Care - PPO | Source: Ambulatory Visit | Attending: Plastic Surgery | Admitting: Plastic Surgery

## 2020-02-13 DIAGNOSIS — Z01812 Encounter for preprocedural laboratory examination: Secondary | ICD-10-CM | POA: Insufficient documentation

## 2020-02-13 DIAGNOSIS — Z20822 Contact with and (suspected) exposure to covid-19: Secondary | ICD-10-CM | POA: Diagnosis not present

## 2020-02-13 LAB — SARS CORONAVIRUS 2 (TAT 6-24 HRS): SARS Coronavirus 2: NEGATIVE

## 2020-02-15 ENCOUNTER — Encounter (HOSPITAL_BASED_OUTPATIENT_CLINIC_OR_DEPARTMENT_OTHER)
Admission: RE | Admit: 2020-02-15 | Discharge: 2020-02-15 | Disposition: A | Discharge status: Home / Self Care | Payer: Commercial Managed Care - PPO | Source: Ambulatory Visit | Attending: Plastic Surgery | Admitting: Plastic Surgery

## 2020-02-15 DIAGNOSIS — I10 Essential (primary) hypertension: Secondary | ICD-10-CM | POA: Diagnosis not present

## 2020-02-15 DIAGNOSIS — X58XXXA Exposure to other specified factors, initial encounter: Secondary | ICD-10-CM | POA: Diagnosis not present

## 2020-02-15 DIAGNOSIS — M1990 Unspecified osteoarthritis, unspecified site: Secondary | ICD-10-CM | POA: Diagnosis not present

## 2020-02-15 DIAGNOSIS — F419 Anxiety disorder, unspecified: Secondary | ICD-10-CM | POA: Diagnosis not present

## 2020-02-15 DIAGNOSIS — E669 Obesity, unspecified: Secondary | ICD-10-CM | POA: Diagnosis not present

## 2020-02-15 DIAGNOSIS — E785 Hyperlipidemia, unspecified: Secondary | ICD-10-CM | POA: Diagnosis not present

## 2020-02-15 DIAGNOSIS — K219 Gastro-esophageal reflux disease without esophagitis: Secondary | ICD-10-CM | POA: Diagnosis not present

## 2020-02-15 DIAGNOSIS — Z6830 Body mass index (BMI) 30.0-30.9, adult: Secondary | ICD-10-CM | POA: Diagnosis not present

## 2020-02-15 DIAGNOSIS — Z923 Personal history of irradiation: Secondary | ICD-10-CM | POA: Diagnosis not present

## 2020-02-15 DIAGNOSIS — T8131XA Disruption of external operation (surgical) wound, not elsewhere classified, initial encounter: Secondary | ICD-10-CM | POA: Diagnosis not present

## 2020-02-15 DIAGNOSIS — Z853 Personal history of malignant neoplasm of breast: Secondary | ICD-10-CM | POA: Diagnosis not present

## 2020-02-15 DIAGNOSIS — Z87891 Personal history of nicotine dependence: Secondary | ICD-10-CM | POA: Diagnosis not present

## 2020-02-15 DIAGNOSIS — F329 Major depressive disorder, single episode, unspecified: Secondary | ICD-10-CM | POA: Diagnosis not present

## 2020-02-15 DIAGNOSIS — N611 Abscess of the breast and nipple: Secondary | ICD-10-CM | POA: Diagnosis present

## 2020-02-15 LAB — BASIC METABOLIC PANEL
Anion gap: 11 (ref 5–15)
BUN: 9 mg/dL (ref 6–20)
CO2: 27 mmol/L (ref 22–32)
Calcium: 9.7 mg/dL (ref 8.9–10.3)
Chloride: 99 mmol/L (ref 98–111)
Creatinine, Ser: 0.69 mg/dL (ref 0.44–1.00)
GFR calc Af Amer: >60 mL/min (ref >60)
GFR calc non Af Amer: >60 mL/min (ref >60)
Glucose, Bld: 145 mg/dL — ABNORMAL HIGH (ref 70–99)
Potassium: 4.5 mmol/L (ref 3.5–5.1)
Sodium: 137 mmol/L (ref 135–145)

## 2020-02-17 ENCOUNTER — Ambulatory Visit (HOSPITAL_BASED_OUTPATIENT_CLINIC_OR_DEPARTMENT_OTHER): Payer: Commercial Managed Care - PPO | Admitting: Anesthesiology

## 2020-02-17 ENCOUNTER — Encounter (HOSPITAL_BASED_OUTPATIENT_CLINIC_OR_DEPARTMENT_OTHER)
Admission: RE | Disposition: A | Discharge status: Home / Self Care | Payer: Self-pay | Source: Ambulatory Visit | Attending: Plastic Surgery

## 2020-02-17 ENCOUNTER — Ambulatory Visit (HOSPITAL_BASED_OUTPATIENT_CLINIC_OR_DEPARTMENT_OTHER)
Admission: RE | Admit: 2020-02-17 | Discharge: 2020-02-17 | Disposition: A | Discharge status: Home / Self Care | Payer: Commercial Managed Care - PPO | Source: Ambulatory Visit | Attending: Plastic Surgery | Admitting: Plastic Surgery

## 2020-02-17 ENCOUNTER — Other Ambulatory Visit: Payer: Self-pay

## 2020-02-17 ENCOUNTER — Encounter (HOSPITAL_BASED_OUTPATIENT_CLINIC_OR_DEPARTMENT_OTHER): Payer: Self-pay | Admitting: Plastic Surgery

## 2020-02-17 DIAGNOSIS — Z87891 Personal history of nicotine dependence: Secondary | ICD-10-CM | POA: Insufficient documentation

## 2020-02-17 DIAGNOSIS — S21001A Unspecified open wound of right breast, initial encounter: Secondary | ICD-10-CM | POA: Diagnosis not present

## 2020-02-17 DIAGNOSIS — K219 Gastro-esophageal reflux disease without esophagitis: Secondary | ICD-10-CM | POA: Insufficient documentation

## 2020-02-17 DIAGNOSIS — F329 Major depressive disorder, single episode, unspecified: Secondary | ICD-10-CM | POA: Insufficient documentation

## 2020-02-17 DIAGNOSIS — X58XXXA Exposure to other specified factors, initial encounter: Secondary | ICD-10-CM | POA: Insufficient documentation

## 2020-02-17 DIAGNOSIS — E785 Hyperlipidemia, unspecified: Secondary | ICD-10-CM | POA: Insufficient documentation

## 2020-02-17 DIAGNOSIS — F419 Anxiety disorder, unspecified: Secondary | ICD-10-CM | POA: Insufficient documentation

## 2020-02-17 DIAGNOSIS — Z6830 Body mass index (BMI) 30.0-30.9, adult: Secondary | ICD-10-CM | POA: Insufficient documentation

## 2020-02-17 DIAGNOSIS — M1990 Unspecified osteoarthritis, unspecified site: Secondary | ICD-10-CM | POA: Insufficient documentation

## 2020-02-17 DIAGNOSIS — T8131XA Disruption of external operation (surgical) wound, not elsewhere classified, initial encounter: Secondary | ICD-10-CM | POA: Insufficient documentation

## 2020-02-17 DIAGNOSIS — Z853 Personal history of malignant neoplasm of breast: Secondary | ICD-10-CM | POA: Insufficient documentation

## 2020-02-17 DIAGNOSIS — E669 Obesity, unspecified: Secondary | ICD-10-CM | POA: Insufficient documentation

## 2020-02-17 DIAGNOSIS — Z923 Personal history of irradiation: Secondary | ICD-10-CM | POA: Insufficient documentation

## 2020-02-17 DIAGNOSIS — I10 Essential (primary) hypertension: Secondary | ICD-10-CM | POA: Insufficient documentation

## 2020-02-17 HISTORY — PX: DEBRIDEMENT AND CLOSURE WOUND: SHX5614

## 2020-02-17 SURGERY — DEBRIDEMENT, WOUND, WITH CLOSURE
Anesthesia: General | Site: Breast | Laterality: Right

## 2020-02-17 MED ORDER — OXYCODONE HCL 5 MG PO TABS
ORAL_TABLET | ORAL | Status: AC
  Filled 2020-02-17: qty 1

## 2020-02-17 MED ORDER — FENTANYL CITRATE (PF) 100 MCG/2ML IJ SOLN
INTRAMUSCULAR | Status: AC
  Filled 2020-02-17: qty 2

## 2020-02-17 MED ORDER — PROPOFOL 10 MG/ML IV BOLUS
INTRAVENOUS | Status: DC | PRN
  Administered 2020-02-17: 150 mg via INTRAVENOUS

## 2020-02-17 MED ORDER — FENTANYL CITRATE (PF) 100 MCG/2ML IJ SOLN
INTRAMUSCULAR | Status: DC | PRN
  Administered 2020-02-17 (×2): 50 ug via INTRAVENOUS
  Administered 2020-02-17: 100 ug via INTRAVENOUS

## 2020-02-17 MED ORDER — LIDOCAINE 2% (20 MG/ML) 5 ML SYRINGE
INTRAMUSCULAR | Status: DC | PRN
  Administered 2020-02-17: 50 mg via INTRAVENOUS

## 2020-02-17 MED ORDER — KETOROLAC TROMETHAMINE 30 MG/ML IJ SOLN: 30.0000 mg | Freq: Once | INTRAMUSCULAR | Status: DC | PRN

## 2020-02-17 MED ORDER — CEFAZOLIN SODIUM-DEXTROSE 2-4 GM/100ML-% IV SOLN
2.0000 g | INTRAVENOUS | Status: AC
  Administered 2020-02-17: 2 g via INTRAVENOUS

## 2020-02-17 MED ORDER — HYDROMORPHONE HCL 1 MG/ML IJ SOLN
INTRAMUSCULAR | Status: AC
  Filled 2020-02-17: qty 0.5

## 2020-02-17 MED ORDER — CHLORHEXIDINE GLUCONATE CLOTH 2 % EX PADS: 6.0000 | MEDICATED_PAD | Freq: Once | CUTANEOUS | Status: DC

## 2020-02-17 MED ORDER — ONDANSETRON HCL 4 MG/2ML IJ SOLN
INTRAMUSCULAR | Status: DC | PRN
  Administered 2020-02-17: 4 mg via INTRAVENOUS

## 2020-02-17 MED ORDER — EPHEDRINE 5 MG/ML INJ
INTRAVENOUS | Status: AC
  Filled 2020-02-17: qty 10

## 2020-02-17 MED ORDER — CEFAZOLIN SODIUM-DEXTROSE 2-4 GM/100ML-% IV SOLN
INTRAVENOUS | Status: AC
  Filled 2020-02-17: qty 100

## 2020-02-17 MED ORDER — OXYCODONE HCL 5 MG/5ML PO SOLN: 5.0000 mg | Freq: Once | ORAL | Status: AC | PRN

## 2020-02-17 MED ORDER — DEXAMETHASONE SODIUM PHOSPHATE 4 MG/ML IJ SOLN
INTRAMUSCULAR | Status: DC | PRN
  Administered 2020-02-17: 5 mg via INTRAVENOUS

## 2020-02-17 MED ORDER — DEXAMETHASONE SODIUM PHOSPHATE 10 MG/ML IJ SOLN
INTRAMUSCULAR | Status: AC
  Filled 2020-02-17: qty 1

## 2020-02-17 MED ORDER — EPHEDRINE SULFATE 50 MG/ML IJ SOLN
INTRAMUSCULAR | Status: DC | PRN
  Administered 2020-02-17: 10 mg via INTRAVENOUS

## 2020-02-17 MED ORDER — LACTATED RINGERS IV SOLN: INTRAVENOUS | Status: DC

## 2020-02-17 MED ORDER — PROPOFOL 500 MG/50ML IV EMUL
INTRAVENOUS | Status: AC
  Filled 2020-02-17: qty 50

## 2020-02-17 MED ORDER — HYDROMORPHONE HCL 1 MG/ML IJ SOLN
0.2500 mg | INTRAMUSCULAR | Status: DC | PRN
  Administered 2020-02-17: 0.5 mg via INTRAVENOUS
  Administered 2020-02-17: 0.25 mg via INTRAVENOUS

## 2020-02-17 MED ORDER — ONDANSETRON HCL 4 MG/2ML IJ SOLN
INTRAMUSCULAR | Status: AC
  Filled 2020-02-17: qty 2

## 2020-02-17 MED ORDER — SODIUM CHLORIDE 0.9 % IV SOLN
INTRAVENOUS | Status: DC | PRN
  Administered 2020-02-17: 200 mL

## 2020-02-17 MED ORDER — MIDAZOLAM HCL 2 MG/2ML IJ SOLN
INTRAMUSCULAR | Status: AC
  Filled 2020-02-17: qty 2

## 2020-02-17 MED ORDER — MIDAZOLAM HCL 5 MG/5ML IJ SOLN
INTRAMUSCULAR | Status: DC | PRN
  Administered 2020-02-17: 2 mg via INTRAVENOUS

## 2020-02-17 MED ORDER — PROMETHAZINE HCL 25 MG/ML IJ SOLN: 6.2500 mg | INTRAMUSCULAR | Status: DC | PRN

## 2020-02-17 MED ORDER — SUCCINYLCHOLINE CHLORIDE 200 MG/10ML IV SOSY
PREFILLED_SYRINGE | INTRAVENOUS | Status: AC
  Filled 2020-02-17: qty 10

## 2020-02-17 MED ORDER — OXYCODONE HCL 5 MG PO TABS
5.0000 mg | ORAL_TABLET | Freq: Once | ORAL | Status: AC | PRN
  Administered 2020-02-17: 5 mg via ORAL

## 2020-02-17 SURGICAL SUPPLY — 69 items
ADH SKN CLS APL DERMABOND .7 (GAUZE/BANDAGES/DRESSINGS)
BINDER BREAST 3XL (GAUZE/BANDAGES/DRESSINGS) IMPLANT
BINDER BREAST LRG (GAUZE/BANDAGES/DRESSINGS) IMPLANT
BINDER BREAST MEDIUM (GAUZE/BANDAGES/DRESSINGS) IMPLANT
BINDER BREAST XLRG (GAUZE/BANDAGES/DRESSINGS) IMPLANT
BINDER BREAST XXLRG (GAUZE/BANDAGES/DRESSINGS) ×1 IMPLANT
BIOPATCH RED 1 DISK 7.0 (GAUZE/BANDAGES/DRESSINGS) ×1 IMPLANT
BLADE CLIPPER SURG (BLADE) IMPLANT
BLADE HEX COATED 2.75 (ELECTRODE) ×2 IMPLANT
BLADE SURG 10 STRL SS (BLADE) IMPLANT
BLADE SURG 15 STRL LF DISP TIS (BLADE) ×1 IMPLANT
BLADE SURG 15 STRL SS (BLADE) ×2
BNDG GAUZE ELAST 4 BULKY (GAUZE/BANDAGES/DRESSINGS) IMPLANT
COVER BACK TABLE 60X90IN (DRAPES) ×2 IMPLANT
COVER MAYO STAND STRL (DRAPES) ×2 IMPLANT
COVER WAND RF STERILE (DRAPES) IMPLANT
DECANTER SPIKE VIAL GLASS SM (MISCELLANEOUS) IMPLANT
DERMABOND ADVANCED (GAUZE/BANDAGES/DRESSINGS)
DERMABOND ADVANCED .7 DNX12 (GAUZE/BANDAGES/DRESSINGS) IMPLANT
DRAIN CHANNEL 15F RND FF W/TCR (WOUND CARE) ×1 IMPLANT
DRAIN CHANNEL 19F RND (DRAIN) IMPLANT
DRAPE INCISE IOBAN 66X45 STRL (DRAPES) IMPLANT
DRAPE LAPAROSCOPIC ABDOMINAL (DRAPES) IMPLANT
DRAPE LAPAROTOMY 100X72 PEDS (DRAPES) IMPLANT
DRAPE SURG 17X23 STRL (DRAPES) IMPLANT
DRAPE U-SHAPE 76X120 STRL (DRAPES) IMPLANT
DRSG ADAPTIC 3X8 NADH LF (GAUZE/BANDAGES/DRESSINGS) IMPLANT
DRSG EMULSION OIL 3X3 NADH (GAUZE/BANDAGES/DRESSINGS) IMPLANT
DRSG HYDROCOLLOID 4X4 (GAUZE/BANDAGES/DRESSINGS) IMPLANT
DRSG PAD ABDOMINAL 8X10 ST (GAUZE/BANDAGES/DRESSINGS) IMPLANT
DRSG TEGADERM 4X10 (GAUZE/BANDAGES/DRESSINGS) IMPLANT
DRSG TELFA 3X8 NADH (GAUZE/BANDAGES/DRESSINGS) IMPLANT
ELECT REM PT RETURN 9FT ADLT (ELECTROSURGICAL) ×2
ELECTRODE REM PT RTRN 9FT ADLT (ELECTROSURGICAL) ×1 IMPLANT
EVACUATOR SILICONE 100CC (DRAIN) ×1 IMPLANT
GAUZE SPONGE 4X4 12PLY STRL (GAUZE/BANDAGES/DRESSINGS) IMPLANT
GAUZE XEROFORM 5X9 LF (GAUZE/BANDAGES/DRESSINGS) IMPLANT
GLOVE BIO SURGEON STRL SZ 6 (GLOVE) ×1 IMPLANT
GLOVE BIO SURGEON STRL SZ 6.5 (GLOVE) ×4 IMPLANT
GLOVE BIOGEL PI IND STRL 6.5 (GLOVE) IMPLANT
GLOVE BIOGEL PI INDICATOR 6.5 (GLOVE) ×1
GOWN STRL REUS W/ TWL LRG LVL3 (GOWN DISPOSABLE) ×2 IMPLANT
GOWN STRL REUS W/TWL LRG LVL3 (GOWN DISPOSABLE) ×4
KIT DRSG PREVENA PLUS 7DAY 125 (MISCELLANEOUS) ×1 IMPLANT
KIT PREVENA INCISION MGT 13 (CANNISTER) ×1 IMPLANT
NDL HYPO 25X1 1.5 SAFETY (NEEDLE) ×1 IMPLANT
NEEDLE HYPO 25X1 1.5 SAFETY (NEEDLE) ×2 IMPLANT
NS IRRIG 1000ML POUR BTL (IV SOLUTION) ×2 IMPLANT
PACK BASIN DAY SURGERY FS (CUSTOM PROCEDURE TRAY) ×2 IMPLANT
PAD DRESSING TELFA 3X8 NADH (GAUZE/BANDAGES/DRESSINGS) IMPLANT
PENCIL SMOKE EVACUATOR (MISCELLANEOUS) ×2 IMPLANT
SHEET MEDIUM DRAPE 40X70 STRL (DRAPES) IMPLANT
SLEEVE SCD COMPRESS KNEE MED (MISCELLANEOUS) ×2 IMPLANT
SPONGE LAP 18X18 RF (DISPOSABLE) ×3 IMPLANT
STAPLER VISISTAT 35W (STAPLE) IMPLANT
SUT MNCRL AB 3-0 PS2 18 (SUTURE) ×1 IMPLANT
SUT MNCRL AB 4-0 PS2 18 (SUTURE) ×1 IMPLANT
SUT MON AB 5-0 PS2 18 (SUTURE) ×1 IMPLANT
SUT SILK 3 0 PS 1 (SUTURE) IMPLANT
SUT SILK 4 0 PS 2 (SUTURE) ×1 IMPLANT
SWAB COLLECTION DEVICE MRSA (MISCELLANEOUS) IMPLANT
SWAB CULTURE ESWAB REG 1ML (MISCELLANEOUS) IMPLANT
SYR BULB IRRIGATION 50ML (SYRINGE) IMPLANT
SYR CONTROL 10ML LL (SYRINGE) ×2 IMPLANT
TOWEL GREEN STERILE FF (TOWEL DISPOSABLE) ×4 IMPLANT
TRAY DSU PREP LF (CUSTOM PROCEDURE TRAY) ×2 IMPLANT
TUBE CONNECTING 20X1/4 (TUBING) ×2 IMPLANT
UNDERPAD 30X36 HEAVY ABSORB (UNDERPADS AND DIAPERS) ×2 IMPLANT
YANKAUER SUCT BULB TIP NO VENT (SUCTIONS) ×2 IMPLANT

## 2020-02-17 NOTE — Anesthesia Procedure Notes (Signed)
Procedure Name: LMA Insertion Date/Time: 02/17/2020 10:34 AM Performed by: Willa Frater, CRNA Pre-anesthesia Checklist: Patient identified, Emergency Drugs available, Suction available and Patient being monitored Patient Re-evaluated:Patient Re-evaluated prior to induction Oxygen Delivery Method: Circle system utilized Preoxygenation: Pre-oxygenation with 100% oxygen Induction Type: IV induction Ventilation: Mask ventilation without difficulty LMA: LMA inserted LMA Size: 4.0 Number of attempts: 1 Airway Equipment and Method: Bite block Placement Confirmation: positive ETCO2 Tube secured with: Tape Dental Injury: Teeth and Oropharynx as per pre-operative assessment

## 2020-02-17 NOTE — Transfer of Care (Signed)
Immediate Anesthesia Transfer of Care Note  Patient: Rachel Sutton  Procedure(s) Performed: Excision of right breast wound with closure and provena placement (Right Breast)  Patient Location: PACU  Anesthesia Type:General  Level of Consciousness: sedated  Airway & Oxygen Therapy: Patient Spontanous Breathing and Patient connected to face mask oxygen  Post-op Assessment: Report given to RN and Post -op Vital signs reviewed and stable  Post vital signs: Reviewed and stable  Last Vitals:  Vitals Value Taken Time  BP 127/67 02/17/20 1146  Temp    Pulse 95 02/17/20 1150  Resp 21 02/17/20 1150  SpO2 100 % 02/17/20 1150  Vitals shown include unvalidated device data.  Last Pain:  Vitals:   02/17/20 0948  PainSc: 0-No pain      Patients Stated Pain Goal: 4 ([id number])  Complications: No apparent anesthesia complications

## 2020-02-17 NOTE — Anesthesia Procedure Notes (Signed)
Procedure Name: LMA Insertion Date/Time: 02/17/2020 10:26 AM Performed by: Willa Frater, CRNA Pre-anesthesia Checklist: Patient identified, Emergency Drugs available, Suction available and Patient being monitored Patient Re-evaluated:Patient Re-evaluated prior to induction Oxygen Delivery Method: Circle system utilized Preoxygenation: Pre-oxygenation with 100% oxygen Induction Type: IV induction Ventilation: Mask ventilation without difficulty LMA: LMA inserted LMA Size: 4.0 Number of attempts: 1 Airway Equipment and Method: Bite block Placement Confirmation: positive ETCO2 Tube secured with: Tape Dental Injury: Teeth and Oropharynx as per pre-operative assessment

## 2020-02-17 NOTE — Op Note (Signed)
DATE OF OPERATION: 02/17/2020  LOCATION: [facility]  PREOPERATIVE DIAGNOSIS: Right breast wound  POSTOPERATIVE DIAGNOSIS: Same  PROCEDURE: Excision of right breast wound 2 x 8 cm, placement of prevena negative pressure dressing.  SURGEON: Odie Rauen Sanger Simran Mannis, DO  ASSISTANT: Phoebe Sharps, PA  EBL: 5 cc  CONDITION: Stable  COMPLICATIONS: None  INDICATION: The patient, Rachel Sutton, is a 58 y.o. female born on [birth year], is here for treatment of a right breast wound.  She underwent breast cancer treatment which included radiation, reconstruction and then explantation.  she opened up the incision site.  We are here for washout and closure.   PROCEDURE DETAILS:  The patient was seen prior to surgery and marked.  The IV antibiotics were given. The patient was taken to the operating room and given a general anesthetic. A standard time out was performed and all information was confirmed by those in the room. SCDs were placed.   The chest was prepped and draped with betadine.  The area open was ~ 8 cm.  The skin on each side of the previous incision was excised for a 1 cm thick area.  This was sent to pathology due to her diagnosis history. The capsule was excised to help decrease the scar contracture and improve healing and the closure.  The pocket was irrigated with antibiotic solution and saline.  Hemostasis was achieved with electrocautery.  A drain was placed and secured to the chest wall with 4-0 Silk.  The deep layers were closed with the 3-0 Monocryl.  The remaining layers were closed with the 4-0 and 5-0 Monocryl.  The prevena Vac was applied.  Sterile dressings were placed with a breast binder. The patient was allowed to wake up and taken to recovery room in stable condition at the end of the case. The family was notified at the end of the case.   The advanced practice practitioner (APP) assisted throughout the case.  The APP was essential in retraction and counter  traction when needed to make the case progress smoothly.  This retraction and assistance made it possible to see the tissue plans for the procedure.  The assistance was needed for blood control, tissue re-approximation and assisted with closure of the incision site.  The [place] was signed into law in 2016 which includes the topic of electronic health records.  This provides immediate access to information in MyChart.  This includes consultation notes, operative notes, office notes, lab results and pathology reports.  If you have any questions about what you read please let us know at your next visit or call us at the office.  We are right here with you.

## 2020-02-17 NOTE — Anesthesia Preprocedure Evaluation (Addendum)
Anesthesia Evaluation  Patient identified by MRN, date of birth, ID band Patient awake    Reviewed: Allergy & Precautions, NPO status , Patient's Chart, lab work & pertinent test results, reviewed documented beta blocker date and time   History of Anesthesia Complications Negative for: history of anesthetic complications  Airway Mallampati: II  TM Distance: >3 FB Neck ROM: Full    Dental no notable dental hx. (+) Teeth Intact   Pulmonary neg pulmonary ROS, Patient abstained from smoking., former smoker,    Pulmonary exam normal breath sounds clear to auscultation       Cardiovascular hypertension, Pt. on home beta blockers and Pt. on medications Normal cardiovascular exam+ Valvular Problems/Murmurs MVP  Rhythm:Regular Rate:Normal  HLD  TTE 2018 - Left ventricle: The cavity size was normal. Wall thickness was normal. Systolic function was normal. The estimated ejection fraction was in the range of 60% to 65%. Wall motion was normal; there were no regional wall motion abnormalities. Doppler parameters are consistent with abnormal left ventricular relaxation (grade 1 diastolic dysfunction). - Mitral valve: Mild bileaflet prolapse with mild central   regurgitation.   Neuro/Psych PSYCHIATRIC DISORDERS Anxiety Depression negative neurological ROS  negative psych ROS   GI/Hepatic Neg liver ROS, GERD  Controlled and Medicated,  Endo/Other  negative endocrine ROS  Renal/GU negative Renal ROS  negative genitourinary   Musculoskeletal negative musculoskeletal ROS (+) Arthritis ,   Abdominal (+) + obese,   Peds negative pediatric ROS (+)  Hematology negative hematology ROS (+)   Anesthesia Other Findings   Reproductive/Obstetrics negative OB ROS                            Anesthesia Physical  Anesthesia Plan  ASA: II  Anesthesia Plan: General   Post-op Pain Management:    Induction:  Intravenous  PONV Risk Score and Plan: 3 and Midazolam, Dexamethasone, Ondansetron and Treatment may vary due to age or medical condition  Airway Management Planned: LMA  Additional Equipment:   Intra-op Plan:   Post-operative Plan: Extubation in OR  Informed Consent: I have reviewed the patients History and Physical, chart, labs and discussed the procedure including the risks, benefits and alternatives for the proposed anesthesia with the patient or authorized representative who has indicated his/her understanding and acceptance.     Dental advisory given  Plan Discussed with: CRNA and Surgeon  Anesthesia Plan Comments:        Anesthesia Quick Evaluation

## 2020-02-17 NOTE — Interval H&P Note (Signed)
History and Physical Interval Note:  02/17/2020 10:02 AM  Rachel Sutton  has presented today for surgery, with the diagnosis of breast wound.  The various methods of treatment have been discussed with the patient and family. After consideration of risks, benefits and other options for treatment, the patient has consented to  Procedure(s): Excision of right breast wound with closure and possible Integra Bilayer or Cellerate placement (Right) as a surgical intervention.  The patient's history has been reviewed, patient examined, no change in status, stable for surgery.  I have reviewed the patient's chart and labs.  Questions were answered to the patient's satisfaction.     Loel Lofty Mandy Peeks

## 2020-02-17 NOTE — Discharge Instructions (Signed)
INSTRUCTIONS FOR AFTER SURGERY   You will likely have some questions about what to expect following your operation.  The following information will help you and your family understand what to expect when you are discharged from the hospital.  Following these guidelines will help ensure a smooth recovery and reduce risks of complications.  Postoperative instructions include information on: diet, wound care, medications and physical activity.  AFTER SURGERY Expect to go home after the procedure.  In some cases, you may need to spend one night in the hospital for observation.  DIET This surgery does not require a specific diet.  However, I have to mention that the healthier you eat the better your body can start healing. It is important to increasing your protein intake.  This means limiting the foods with added sugar.  Focus on fruits and vegetables and some meat.  If you have any liposuction during your procedure be sure to drink water.  If your urine is bright yellow, then it is concentrated, and you need to drink more water.  As a general rule after surgery, you should have 8 ounces of water every hour while awake.  If you find you are persistently nauseated or unable to take in liquids let us know.  NO TOBACCO USE or EXPOSURE.  This will slow your healing process and increase the risk of a wound.  WOUND CARE Clean with baby wipes until the drain is removed.   Do not remove the VAC or drain. We close your incision to leave the smallest and best-looking scar. No ointment or creams on your incisions until given the go ahead.  Especially not Neosporin (Too many skin reactions with this one).  A few weeks after surgery you can use Mederma and start massaging the scar. We ask you to wear your binder or sports bra for the first 6 weeks around the clock, including while sleeping. This provides added comfort and helps reduce the fluid accumulation at the surgery site. Call the office for any issues regarding  your vac.  ACTIVITY No heavy lifting until cleared by the doctor.  It is OK to walk and climb stairs. In fact, moving your legs is very important to decrease your risk of a blood clot.  It will also help keep you from getting deconditioned.  Every 1 to 2 hours get up and walk for 5 minutes. This will help with a quicker recovery back to normal.  Let pain be your guide so you don't do too much.  NO, you cannot do the spring cleaning and don't plan on taking care of anyone else.  This is your time for TLC.   WORK Everyone returns to work at different times. As a rough guide, most people take at least 1 - 2 weeks off prior to returning to work. If you need documentation for your job, bring the forms to your postoperative follow up visit.  DRIVING Arrange for someone to bring you home from the hospital.  You may be able to drive a few days after surgery but not while taking any narcotics or valium.  BOWEL MOVEMENTS Constipation can occur after anesthesia and while taking pain medication.  It is important to stay ahead for your comfort.  We recommend taking Milk of Magnesia (2 tablespoons; twice a day) while taking the pain pills.  SEROMA This is fluid your body tried to put in the surgical site.  This is normal but if it creates excessive pain and swelling let us know.  It usually decreases in a few weeks.  MEDICATIONS and PAIN CONTROL At your preoperative visit for you history and physical you were given the following medications: 1. An antibiotic: Start this medication when you get home and take according to the instructions on the bottle. 2. Zofran 4 mg:  This is to treat nausea and vomiting.  You can take this every 6 hours as needed and only if needed. 3. Norco (hydrocodone/acetaminophen) 5/325 mg:  This is only to be used after you have taken the motrin or the tylenol. Every 8 hours as needed. Over the counter Medication to take: 4. Ibuprofen (Motrin) 600 mg:  Take this every 6 hours.  If  you have additional pain then take 500 mg of the tylenol.  Only take the Norco after you have tried these two. 5. Miralax or stool softener of choice: Take this according to the bottle if you take the [place] Call your surgeon's office if any of the following occur: . Fever 101 degrees F or greater . Excessive bleeding or fluid from the incision site. . Pain that increases over time without aid from the medications . Redness, warmth, or pus draining from incision sites . Persistent nausea or inability to take in liquids . Severe misshapen area that underwent the operation.     [facility]  What is the benefit of having a drain?  During surgery your tissue layers are separated.  This raw surface stimulates your body to fill the space with serous fluid.  This is normal but you don't want that fluid to collect and prevent healing.  A fluid collection can also become infected.  The Bence-Pratt (JP) drain is used to eliminate this collection of fluid and allow the tissue to heal together.    [place]-Pratt (JP) bulb ?   How to care for your drainage and suction unit at home Your drainage catheter will be connected to a collection device. The vacuumcaused when the device is compressed allows drainage to collect in the device.  Wash your hands with soap and water before and after touching the system.  Empty the JP drain every 12 hours once you get home from your procedure.  Record the fluid amount on the record sheet included.  Start with stripping the drain tube to push the clots or excess fluid to the bulb.  Do this by pinching the tube with one hand near your skin.  Then with the other hand squeeze the tubing and work it toward the bulb.  This should be done several times a day.  This may collapse the tube which will correct on its own.    Use a safety pintoattach your collection device to your clothing so there is no tension on the insertion  site.  If you have drainage at the skin insertion site, youcan apply a gauze dressing and secure it with tape.  If the drain falls out, apply a gauze dressing over the drain insertion site and secure with tape. ? To empty the collection device:  Release the stopper on the top of the collectionunit (bulb).  Pour contents into a measuring containersuch as a plastic medicine cup.  Record the day and amount of drainage on the attached sheet.  This should bedone at least twice a day. ? To compress the [place]-Pratt Bulb:  Release the stopper at the topof the bulb.  Squeeze the bulb tightly in your fist, squeezing air out of the bulb.  Replacethe stopper while  the bulb is compressed.  Be careful not to spill the contents when squeezing the bulb.  The drainage will start bright red and turn to pink and then yellow with time.  IMPORTANT: If the bulb is not squeezed before adding the stopper it will not draw out the fluid.  Care for the JP drain site and your skin daily:  You may shower three days after surgery.  Secure the drain to a ribbon or cloth around your waist while showering so it does not pull outwhile showering.  Be sure your hands are cleaned with soap and water.  Use a clean wet cotton swab to clean the skin around the drain site.  Use another cotton swab to place Vaseline or antibiotic ointment on the skin around the drain.     Contact your physician if any of the following occur:  The fluid in the bulb becomes cloudy.  Your temperature is greater than 101.4.  The incision opens.  If you have drainage at the skin insertion site, youcan apply a gauze dressing and secure it with tape.  If the drain falls out, apply a gauze dressing over the drain insertion site and secure with tape.  You will usually have more drainage when you are active than while you rest or are asleep.If the drainage increases significantly or is bloody call the  physician                             Bring this record with you to each office visit Date Drainage Volume Date Drainagevolume

## 2020-02-17 NOTE — Anesthesia Postprocedure Evaluation (Signed)
Anesthesia Post Note  Patient: Rachel Sutton  Procedure(s) Performed: Excision of right breast wound with closure and provena placement (Right Breast)     Patient location during evaluation: PACU Anesthesia Type: General Level of consciousness: awake and alert Pain management: pain level controlled Vital Signs Assessment: post-procedure vital signs reviewed and stable Respiratory status: spontaneous breathing, nonlabored ventilation, respiratory function stable and patient connected to nasal cannula oxygen Cardiovascular status: blood pressure returned to baseline and stable Postop Assessment: no apparent nausea or vomiting Anesthetic complications: no    Last Vitals:  Vitals:   02/17/20 1211 02/17/20 1215  BP:  131/76  Pulse: 91 91  Resp: 19 (!) 22  Temp:    SpO2: 100% 100%    Last Pain:  Vitals:   02/17/20 1211  PainSc: 5                  Ranita Stjulien S

## 2020-02-18 LAB — SURGICAL PATHOLOGY

## 2020-02-24 ENCOUNTER — Encounter: Payer: Self-pay | Admitting: Nurse Practitioner

## 2020-02-24 ENCOUNTER — Inpatient Hospital Stay: Payer: Commercial Managed Care - PPO | Attending: Hematology | Admitting: Nurse Practitioner

## 2020-02-24 DIAGNOSIS — C50411 Malignant neoplasm of upper-outer quadrant of right female breast: Secondary | ICD-10-CM

## 2020-02-24 DIAGNOSIS — Z122 Encounter for screening for malignant neoplasm of respiratory organs: Secondary | ICD-10-CM | POA: Diagnosis not present

## 2020-02-24 DIAGNOSIS — Z170 Estrogen receptor positive status [ER+]: Secondary | ICD-10-CM | POA: Diagnosis not present

## 2020-02-24 NOTE — Progress Notes (Signed)
I connected with Rachel Sutton on 02/24/20 at  9:45 AM EST by telephone and verified that I am speaking with the correct person using two identifiers.  I discussed the limitations, risks, security and privacy concerns of performing an evaluation and management service by telephone and the availability of in person appointments. I also discussed with the patient that there may be a patient responsible charge related to this service. The patient expressed understanding and agreed to proceed.   Patient Care Team: Janie Morning, DO as PCP - General (Family Medicine) Satira Sark, MD as PCP - Cardiology (Cardiology) Lendon Colonel, NP as Nurse Practitioner (Nurse Practitioner) Mauro Kaufmann, RN as Oncology Nurse Navigator Rockwell Germany, RN as Oncology Nurse Navigator Truitt Merle, MD as Consulting Physician (Hematology) Jovita Kussmaul, MD as Consulting Physician (General Surgery) Gery Pray, MD as Consulting Physician (Radiation Oncology) Lahoma Rocker, MD as Consulting Physician (Rheumatology) Alla Feeling, NP as Nurse Practitioner (Nurse Practitioner)  BRIEF ONCOLOGIC HISTORY:  Oncology History  Breast cancer [facility]  10/16/2002 Initial Biopsy   10/16/2002   BREAST, RIGHT, 4 O'CLOCK, NEEDLE BIOPSIES: INVASIVE AND   INTRADUCTAL CARCINOMA.  Results   IMMUNOHISTOCHEMICAL AND MORPHOMETRIC ANALYSIS BY IMAGE CYTOMETRY   (The tumor is immunocytochemically stained for estrogen and   progesterone receptors and Mib-1, and quantitated   morphometrically.)   Estrogen Receptor (Negative, <1%): NEGATIVE   Progesterone Receptor (Negative, <1%): NEGATIVE   Proliferation Marker Ki67 by MIB-1 (Low <30%): 47%  HERCEPTEST   Her 2 neu by immunocytochemical stain (Negative): 3+;   POSITIVE    10/30/2002 Surgery   Surgery 10/30/02 FINAL DIAGNOSIS    1. SENTINEL LYMPH NODE BIOPSY, RIGHT AXILLARY NODE: ONE LYMPH   NODE, NEGATIVE FOR METASTATIC CARCINOMA (0/1), SEE COMMENT.    2.  LUMPECTOMY, RIGHT BREAST:   - INVASIVE MAMMARY CARCINOMA, NO SPECIAL TYPE (DUCTAL), 1.3   CM, MSBR GRADE 3 OF 3.   - DUCTAL CARCINOMA IN SITU, HIGH-GRADE.   - EXTENSIVE INTRADUCTAL COMPONENT NEGATIVE (JCRT).   - NO LYMPHOVASCULAR INVASION IDENTIFIED.   - MARGINS FREE OF TUMOR.   - SEE COMMENT AND ONCOLOGY TABLE.    2003 - 07/2003 Radiation Therapy   Underwent adjuvant radiation to 50.4 gray with an additional 10 gray boost to the surgical cavity completed 07/19/2003     - 02/2004 Chemotherapy   AC for 4 cycles and decetaxel and Herceptin for 4 cycles and then continued herceptin for totla 1 year treatment.      02/22/2012 Initial Diagnosis   Breast cancer ([place])   Malignant neoplasm of upper-outer quadrant of right breast in female, estrogen receptor positive ([place])  07/01/2019 Mammogram   Diagnostic mammogram 07/01/19  IMPRESSION: Suspicious mass in the 9:30 region of the right breast 9 cm from the nipple measuring 1.3 x 1.3 x 1.1 cm. Suspicious mass in the right axilla measuring 5 x 4 x 4 mm.   07/02/2019 Cancer Staging   Staging form: Breast, AJCC 8th Edition - Clinical stage from 07/02/2019: Stage IA (cT1c, cN0, cM0, G2, ER+, PR+, HER2: Equivocal) - Signed by Truitt Merle, MD on 07/07/2019   07/02/2019 Initial Biopsy   Diagnosis 07/02/19 1. Breast, right, needle core biopsy, 9:30 o'clock - INVASIVE DUCTAL CARCINOMA, GRADE 1-2. SEE NOTE - DUCTAL CARCINOMA IN SITU, INTERMEDIATE GRADE 2. Lymph node, needle/core biopsy, right axilla - LYMPH NODE, NEGATIVE FOR CARCINOMA   07/02/2019 Receptors her2   By immunohistochemistry, the tumor cells are EQUIVOCAL for Her2 (2+). HER2 by  FISH will be PERFORMED and the RESULTS REPORTED SEPARATELY Estrogen Receptor: 100%, POSITIVE, STRONG STAINING INTENSITY Progesterone Receptor: 90%, POSITIVE, STRONG STAINING INTENSITY Proliferation Marker Ki67: 10%   07/06/2019 Initial Diagnosis   Malignant neoplasm of upper-outer quadrant of right breast in female,  estrogen receptor positive ([place])   07/08/2019 -  Anti-estrogen oral therapy   Anastrozole 74m once daily starting 07/08/19 before surgery.    07/20/2019 Genetic Testing   Negative genetic testing on the [facility] Multi-Cancer Panel. The report date is 07/20/2019.  The Multi-Gene Panel offered by [facility] includes sequencing and/or deletion duplication testing of the following 85 genes: AIP, ALK, APC, ATM, AXIN2,BAP1,  BARD1, BLM, BMPR1A, BRCA1, BRCA2, BRIP1, CASR, CDC73, CDH1, CDK4, CDKN1B, CDKN1C, CDKN2A (p14ARF), CDKN2A (p16INK4a), CEBPA, CHEK2, CTNNA1, DICER1, DIS3L2, EGFR (c.2369C>T, p.Thr790Met variant only), EPCAM (Deletion/duplication testing only), FH, FLCN, GATA2, GPC3, GREM1 (Promoter region deletion/duplication testing only), HOXB13 (c.251G>A, p.Gly84Glu), HRAS, KIT, MAX, MEN1, MET, MITF (c.952G>A, p.Glu318Lys variant only), MLH1, MSH2, MSH3, MSH6, MUTYH, NBN, NF1, NF2, NTHL1, PALB2, PDGFRA, PHOX2B, PMS2, POLD1, POLE, POT1, PRKAR1A, PTCH1, PTEN, RAD50, RAD51C, RAD51D, RB1, RECQL4, RET, RNF43, RUNX1, SDHAF2, SDHA (sequence changes only), SDHB, SDHC, SDHD, SMAD4, SMARCA4, SMARCB1, SMARCE1, STK11, SUFU, TERC, TERT, TMEM127, TP53, TSC1, TSC2, VHL, WRN and WT1.     08/29/2019 Surgery   RIGHT MASTECTOMY WITH SENTINEL LYMPH NODE BIOPSY and RECONSTRUCTION WITH PLACEMENT OF TISSUE EXPANDER AND FLEX HD (ACELLULAR HYDRATED DERMIS) by Dr. TMarlou Starksand Dr DMarla Roe 09/07/19   09/07/2019 Pathology Results   DIAGNOSIS: 09/07/19  A. SENTINEL LYMPH NODE, RIGHT # 1, BIOPSY:  - One lymph node, negative for carcinoma (0/1).   B. SENTINEL LYMPH NODE, RIGHT # 2, BIOPSY:  - One lymph node, negative for carcinoma (0/1).   C. BREAST, RIGHT, MASTECTOMY:  - Invasive ductal carcinoma, 1.8 cm, Nottingham grade 1 of 3.  - Margins of resection are not involved (Closest margin: 20 mm, deep).  - Ductal carcinoma in situ, focal.  - Biopsy site changes.  - See oncology table.    09/07/2019 Oncotype testing   Oncotype RS:  17, meaning there is 5% distant recurrence risk at 9 years with AI/tamoxifen alone. Indicating <1% chemo benefit    10/28/2019 Cancer Staging   Staging form: Breast, AJCC 8th Edition - Pathologic: Stage IA (pT1c, pN0, cM0, G1, ER+, PR+, HER2-, Oncotype DX score: 17) - Signed by FTruitt Merle MD on 10/28/2019   12/31/2019 Pathology Results   FINAL MICROSCOPIC DIAGNOSIS:  A. SKIN, RIGHT BREAST MASTECTOMY FLAP AND SCAR:  - Skin and subcutaneous tissue with fibrosis, mild inflammation and  focal giant cell reaction consistent with scar.  - No evidence of malignancy.    02/24/2020 Survivorship   SCP delivered by LCira Rue NP      INTERVAL HISTORY:  Ms. JNoellto review her survivorship care plan detailing her treatment course for breast cancer, as well as monitoring long-term side effects of that treatment, education regarding health maintenance, screening, and overall wellness and health promotion.     Ms. JRussmanis doing well. She just had another reconstructive surgery on 02/17/20. She has a wound vac and drainage tubes. She states Dr. DMarla Roewas not able to do the reconstruction due to her past radiation and surgery. She has some discomfort at the tube site, otherwise denies pain. No new concerns in left breast. She does self exam. Denies hot flashes or bone/joint pain except "normal RA pain." Denies recent fever, chills, cough, chest pain, dyspnea, n/v/c/d, rectal/GYN bleeding. Her mood  is stable lately.    ONCOLOGY TREATMENT TEAM:  1. Surgeon:  Dr. Marlou Starks at [facility] 2. Medical Oncologist: Dr. Burr Medico  3. Radiation Oncologist: Dr. Sondra Come    PAST MEDICAL/SURGICAL HISTORY:  Past Medical History:  Diagnosis Date  . Anxiety   . Arthritis   . Breast cancer ([place]) 02/22/2012   Right T1c, N0  . C. difficile colitis   . Colitis, collagenous 09/22/2015  . Depression   . Essential hypertension, benign   . Family history of brain cancer   . Family history of esophageal  cancer   . Family history of lung cancer   . Family history of lymphoma   . Family history of pancreatic cancer   . Fuchs' corneal dystrophy   . GERD (gastroesophageal reflux disease)   . Hypertension   . Mitral valve prolapse    Mild mitral regurgitation  . Mixed hyperlipidemia   . Seizures ([place])    Only with dose of haldol; no more since that was stopped.   Past Surgical History:  Procedure Laterality Date  . ABDOMINAL HYSTERECTOMY  1999  . BACK SURGERY    . BIOPSY N/A 09/28/2015   Procedure: BIOPSY;  Surgeon: Danie Binder, MD;  Location: [facility];  Service: Endoscopy;  Laterality: N/A;  . BIOPSY  04/05/2016   Procedure: BIOPSY;  Surgeon: Danie Binder, MD;  [facility];  Service: Endoscopy;;  colon bx  . BREAST LUMPECTOMY  10/30/2002   right  . BREAST RECONSTRUCTION WITH PLACEMENT OF TISSUE EXPANDER AND FLEX HD (ACELLULAR HYDRATED DERMIS) Right 09/07/2019   Procedure: RIGHT BREAST RECONSTRUCTION WITH PLACEMENT OF TISSUE EXPANDER AND FLEX HD (ACELLULAR HYDRATED DERMIS);  Surgeon: Wallace Going, DO;  Location: [place];  Service: Plastics;  Laterality: Right;  . CARDIAC CATHETERIZATION    . Carpal tunnel R/L  1992  . Cervical spine fusion X2 since last visit    . COLONOSCOPY WITH PROPOFOL N/A 09/28/2015   Procedure: COLONOSCOPY WITH PROPOFOL;  Surgeon: Danie Binder, MD;  Location: [facility];  Service: Endoscopy;  Laterality: N/A;  procedure 1 cecum time in 1232  time out 1246   total time 14 mintes  . COLONOSCOPY WITH PROPOFOL N/A 04/05/2016   Procedure: COLONOSCOPY WITH PROPOFOL;  Surgeon: Danie Binder, MD;  [facility];  Service: Endoscopy;  Laterality: N/A;  1200  . 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 DEBRIDEMENT AND CLOSURE WOUND Right 02/17/2020   Procedure: Excision of right breast wound with closure and provena placement;  Surgeon: Wallace Going, DO;  Location: [place];  Service: Plastics;  Laterality: Right;  .  ELBOW SURGERY Right  tennis elbow release 2012  . ESOPHAGOGASTRODUODENOSCOPY  10/26/2012   Procedure: ESOPHAGOGASTRODUODENOSCOPY (EGD);  Surgeon: Inda Castle, MD;  Location: [place];  Service: Endoscopy;  Laterality: N/A;  . ESOPHAGOGASTRODUODENOSCOPY (EGD) WITH PROPOFOL N/A 09/28/2015   Procedure: ESOPHAGOGASTRODUODENOSCOPY (EGD) WITH PROPOFOL;  Surgeon: Danie Binder, MD;  [facility];  Service: Endoscopy;  Laterality: N/A;  . EYE SURGERY  2018   corneal transplants bilateral  . KNEE ARTHROSCOPY Left   . LEFT HEART CATHETERIZATION WITH CORONARY ANGIOGRAM N/A 10/23/2012   Procedure: LEFT HEART CATHETERIZATION WITH CORONARY ANGIOGRAM;  Surgeon: Peter M Martinique, MD;  Location: [facility] C[facility] LAB;  Service: Cardiovascular;  Laterality: N/A;  . MASTECTOMY W/ SENTINEL NODE BIOPSY Right 09/07/2019   Procedure: RIGHT MASTECTOMY WITH SENTINEL LYMPH NODE BIOPSY;  Surgeon: Autumn Messing  III, MD;  Location: [place];  Service: General;  Laterality: Right;  . OVARIAN CYST REMOVAL     prior to hysterectomy, right  . Ray cage fusion  1997  . REMOVAL OF TISSUE EXPANDER Right 12/31/2019   Procedure: REMOVAL OF TISSUE EXPANDER AND EXCESS SKIN;  Surgeon: Wallace Going, DO;  Location: [place];  Service: Plastics;  Laterality: Right;  1 hour  . SENTINEL LYMPH NODE BIOPSY  10/30/2002   right  . TONSILLECTOMY     at six months old     ALLERGIES:  Allergies  Allergen Reactions  . Bee Venom Anaphylaxis  . Haloperidol Lactate Other (See Comments)    Convulsions   . Meperidine Hcl Hives  . Xifaxan [Rifaximin] Diarrhea and Other (See Comments)    Constantly felt the need to use the bathroom      CURRENT MEDICATIONS:  Outpatient Encounter Medications as of 02/24/2020  Medication Sig  . acetaminophen (TYLENOL) 500 MG tablet Take 1,000 mg by mouth every 6 (six) hours as needed for moderate pain or headache.  . ALPRAZolam (XANAX) 0.5 MG tablet TAKE 1 TABLET DAILY  . anastrozole  (ARIMIDEX) 1 MG tablet TAKE 1 TABLET(1 MG) BY MOUTH DAILY  . Calcium-Magnesium-Vitamin D (CALCIUM 500 PO) Take 1 tablet by mouth daily.   . diclofenac (VOLTAREN) 75 MG EC tablet Take 75 mg by mouth 2 (two) times daily as needed for mild pain.  Marland Kitchen diclofenac sodium (VOLTAREN) 1 % GEL Apply 1 application topically 3 (three) times daily as needed (pain).   . fish oil-omega-3 fatty acids 1000 MG capsule Take 1 g by mouth every morning.   . hydrochlorothiazide (MICROZIDE) 12.5 MG capsule Take 1 capsule (12.5 mg total) by mouth daily.  . hydroxychloroquine (PLAQUENIL) 200 MG tablet Take 400 mg by mouth daily.  Marland Kitchen leflunomide (ARAVA) 20 MG tablet Take 20 mg by mouth daily.  . metoprolol succinate (TOPROL-XL) 50 MG 24 hr tablet TAKE 1 TABLET TWICE A DAY  WITH OR IMMEDIATELY        FOLLOWING A MEAL. (Patient taking differently: Take 50 mg by mouth 2 (two) times daily. )  . Multiple Vitamin (MULITIVITAMIN WITH MINERALS) TABS Take 1 tablet by mouth every morning.  Marland Kitchen NIFEdipine (PROCARDIA XL/NIFEDICAL-XL) 90 MG 24 hr tablet TAKE 1 TABLET DAILY  . pantoprazole (PROTONIX) 40 MG tablet Take 1 tablet (40 mg total) by mouth daily.  Marland Kitchen EPINEPHrine (EPI-PEN) 0.3 mg/0.3 mL DEVI Inject 0.3 mg into the muscle as needed (bee stings).   . nitroGLYCERIN (NITROSTAT) 0.4 MG SL tablet Place 1 tablet (0.4 mg total) under the tongue every 5 (five) minutes x 3 doses as needed for chest pain.   No facility-administered encounter medications on file as of 02/24/2020.     ONCOLOGIC FAMILY HISTORY:  Family History  Problem Relation Age of Onset  . Diabetes Mother   . Pancreatic cancer Mother 79  . Lung cancer Father   . Diabetes Father   . Heart disease Father   . Esophageal cancer Father 59       Beer drinker  . Brain cancer Brother 69  . Brain cancer Maternal Grandfather   . Heart disease Paternal Grandfather   . Cancer Maternal Uncle        unknown form of cancer; cigar smoker  . Lung cancer Maternal Grandmother   .  Brain cancer Cousin        dx in his late 68s-40s  . Schizophrenia Sister   .  Lymphoma Niece 29  . Anesthesia problems Neg Hx   . Hypotension Neg Hx   . Malignant hyperthermia Neg Hx   . Pseudochol deficiency Neg Hx   . Colon cancer Neg Hx   . Colon polyps Neg Hx   . Crohn's disease Neg Hx   . Ulcerative colitis Neg Hx      GENETIC COUNSELING/TESTING: Yes, negative on 07/20/2019  SOCIAL HISTORY:  Social History   Socioeconomic History  . Marital status: Married    Spouse name: Not on file  . Number of children: 2  . Years of education: Not on file  . Highest education level: Not on file  Occupational History  . Occupation: Aeronautical engineer: [employer]    Comment: [employer]  Tobacco Use  . Smoking status: Former Smoker    Packs/day: 0.50    Years: 35.00    Pack years: 17.50    Types: Cigarettes    Start date: 07/07/1974    Quit date: 07/14/2019    Years since quitting: 0.6  . Smokeless tobacco: Never Used  Substance and Sexual Activity  . Alcohol use: Not Currently    Alcohol/week: 0.0 standard drinks    Comment: stopped July 26, 2014)  . Drug use: No  . Sexual activity: Not Currently    Birth control/protection: Surgical  Other Topics Concern  . Not on file  Social History Narrative  . Not on file   Social Determinants of Health   Financial Resource Strain:   . Difficulty of Paying Living Expenses: Not on file  Food Insecurity:   . Worried About Charity fundraiser in the Last Year: Not on file  . Ran Out of Food in the Last Year: Not on file  Transportation Needs:   . Lack of Transportation (Medical): Not on file  . Lack of Transportation (Non-Medical): Not on file  Physical Activity:   . Days of Exercise per Week: Not on file  . Minutes of Exercise per Session: Not on file  Stress:   . Feeling of Stress : Not on file  Social Connections:   . Frequency of Communication with Friends and Family: Not on file  . Frequency of Social Gatherings with  Friends and Family: Not on file  . Attends Religious Services: Not on file  . Active Member of Clubs or Organizations: Not on file  . Attends Archivist Meetings: Not on file  . Marital Status: Not on file  Intimate Partner Violence:   . Fear of Current or Ex-Partner: Not on file  . Emotionally Abused: Not on file  . Physically Abused: Not on file  . Sexually Abused: Not on file     OBSERVATIONS/OBJECTIVE:  Patient appears well over the phone. No cough or conversational dyspnea. Speech is clear and intact. Mood and affect appear normal.   LABORATORY DATA:  None for this visit.  DIAGNOSTIC IMAGING:  None for this visit.      ASSESSMENT AND PLAN:  Ms.. Sokolow is a pleasant 59 y.o. female with Stage IA right breast invasive ductal carcinoma, ER+/PR+/HER2-, diagnosed in 06/2019, treated with right mastectomy and anti-estrogen therapy with Anastrozole beginning in 06/2019 prior to surgery.  She presents to the Survivorship Clinic for our initial meeting and routine follow-up post-completion of treatment for breast cancer.    1. Stage IA right breast cancer:  Ms. Headings is continuing to recover from definitive treatment for breast cancer, dealing with reconstructive issues and wound healing. She will follow-up  with her medical oncologist, Dr. Burr Medico in 04/2019 with history and physical exam per surveillance protocol.  She will continue her anti-estrogen therapy with Anastrozole. Thus far, she is tolerating very well, without significant side effects. She was instructed to make Dr. Burr Medico or myself aware if she begins to experience any worsening side effects of the medication and I could see her back in clinic to help manage those side effects, as needed. Her left mammogram is due in 06/2019; orders placed today.  Her breast density is category c. She performs her own self exams and actually detected her own breast cancers. Today, a comprehensive survivorship care plan and treatment summary  was reviewed with the patient today detailing her breast cancer diagnosis, treatment course, potential late/long-term effects of treatment, appropriate follow-up care with recommendations for the future, and patient education resources.  A copy of this summary, along with a letter will be sent to the patient's primary care provider via In Basket message after today's visit.    3. Bone health:  Given Ms. Hofmeister age/history of breast cancer and her current treatment regimen including anti-estrogen therapy with Anastrozole, she is at risk for bone demineralization.  Her last DEXA scan was 2013 which was normal. She is due for repeat screening. Orders placed today, she will schedule with her left mammogram. In the meantime, she was encouraged to continue oral calcium plus vitamin D, as well as increase her weight-bearing activities.  She was given education on specific activities to promote bone health.  4. Cancer screening:  Due to Ms. Bibb history and her age, she should receive screening for skin cancers, colon cancer, and gynecologic cancers.  Due to her 15yrsmoking history, she qualifies for lung cancer screening CT and is interested, I placed the order today. The information and recommendations are listed on the patient's comprehensive care plan/treatment summary and were reviewed in detail with the patient.    5. Health maintenance and wellness promotion: Ms. JSeabornwas encouraged to consume 5-7 servings of fruits and vegetables per day. We reviewed the "Nutrition Rainbow" handout, as well as the handout "Take Control of Your Health and Reduce Your Cancer Risk" from the [employer]  She was also encouraged to engage in moderate to vigorous exercise for 30 minutes per day most days of the week. We discussed the [employer] fitness program, which is designed for cancer survivors to help them become more physically fit after cancer treatments.  She was instructed to limit her alcohol  consumption and continue to abstain from tobacco use.     6. Support services/counseling: It is not uncommon for this period of the patient's cancer care trajectory to be one of many emotions and stressors.  We discussed how this can be increasingly difficult during the times of quarantine and social distancing due to the COVID-19 pandemic.   She was given information regarding our available services and encouraged to contact me with any questions or for help enrolling in any of our support group/programs.   Orders Placed This Encounter  Procedures  . CT CHEST LUNG CA SCREEN LOW DOSE W/O CM    Standing Status:   Future    Standing Expiration Date:   04/25/2021    Order Specific Question:   Reason for Exam (SYMPTOM  OR DIAGNOSIS REQUIRED)    Answer:   lung cancer screening    Order Specific Question:   Preferred Imaging Location?    Answer:   [id number]  Order Specific Question:   Is patient pregnant?    Answer:   No    Order Specific Question:   Radiology Contrast Protocol - do NOT remove file path    Answer:   [email or web address]   Follow up instructions:    -Return to cancer center 04/2019 -Mammogram due in 06/2019 with DEXA -lung cancer screening CT, ordered today -Follow up with surgery as indicated  -She is welcome to return back to the Survivorship Clinic at any time; no additional follow-up needed at this time.  -Consider referral back to survivorship as a long-term survivor for continued surveillance The patient was provided an opportunity to ask questions and all were answered. The patient agreed with the plan and demonstrated an understanding of the instructions.  The patient was advised to call back or seek an in-person evaluation if the symptoms worsen or if the condition fails to improve as anticipated.  I provided 24 minutes of non-face-to-face time during this encounter.   Alla Feeling, NP

## 2020-02-26 ENCOUNTER — Other Ambulatory Visit: Payer: Self-pay

## 2020-02-26 ENCOUNTER — Encounter: Payer: Self-pay | Admitting: Plastic Surgery

## 2020-02-26 ENCOUNTER — Ambulatory Visit (INDEPENDENT_AMBULATORY_CARE_PROVIDER_SITE_OTHER): Payer: Commercial Managed Care - PPO | Admitting: Plastic Surgery

## 2020-02-26 VITALS — BP 127/84 | HR 112 | Temp 97.3°F | Ht 63.0 in | Wt 170.0 lb

## 2020-02-26 DIAGNOSIS — S21001A Unspecified open wound of right breast, initial encounter: Secondary | ICD-10-CM

## 2020-02-26 NOTE — Progress Notes (Signed)
The patient is a 59 year old female here for follow-up after undergoing excision of the right breast wound.  She denies any fevers. The drain output has been minimal.  It serosanguineous in color.  I went ahead and removed the drain.  The incisions are intact.  She does appear to be healing.  Due to the radiation this may be slow.  Continue with the sports bra.  And I like to see her back in 2 weeks.  I had like her to continue using the ibuprofen 1-2 times a day the next week to help with inflammation.

## 2020-03-10 ENCOUNTER — Other Ambulatory Visit: Payer: Self-pay | Admitting: Cardiology

## 2020-03-10 NOTE — Progress Notes (Signed)
Patient is a 59 year old female here for follow-up after undergoing excision of her right breast wound on 02/17/2020 with Dr. Tonny Branch removed on 02/26/2020.  Rachel Sutton reports fluid build up in her right breast. Denies pain, fever, n/v. Incision in healing, c/d/i. No signs of drainage. Skin superior to incision is red and presence of seroma.  95 cc of serous fluid was aspirated from the right breast. Rx for 5 days of doxycycline was sent to the pharmacy.  Continue to wear sports bra 24/7 until 6 weeks postop.  May take ibuprofen 1-2 times a day to help with inflammation.  Follow-up in 2 weeks.  Call office with any questions/concerns or if condition worsens.  The [place] was signed into law in 2016 which includes the topic of electronic health records.  This provides immediate access to information in MyChart.  This includes consultation notes, operative notes, office notes, lab results and pathology reports.  If you have any questions about what you read please let us know at your next visit or call us at the office.  We are right here with you.

## 2020-03-11 ENCOUNTER — Encounter: Payer: Self-pay | Admitting: Plastic Surgery

## 2020-03-11 ENCOUNTER — Ambulatory Visit (INDEPENDENT_AMBULATORY_CARE_PROVIDER_SITE_OTHER): Payer: Commercial Managed Care - PPO | Admitting: Plastic Surgery

## 2020-03-11 ENCOUNTER — Other Ambulatory Visit: Payer: Self-pay

## 2020-03-11 VITALS — BP 132/84 | HR 101 | Temp 97.5°F | Ht 63.0 in | Wt 170.0 lb

## 2020-03-11 DIAGNOSIS — S21001A Unspecified open wound of right breast, initial encounter: Secondary | ICD-10-CM

## 2020-03-11 DIAGNOSIS — Z9011 Acquired absence of right breast and nipple: Secondary | ICD-10-CM

## 2020-03-11 MED ORDER — DOXYCYCLINE HYCLATE 100 MG PO TABS: 100.0000 mg | ORAL_TABLET | Freq: Two times a day (BID) | ORAL | 0 refills | Status: AC

## 2020-03-18 ENCOUNTER — Telehealth: Payer: Self-pay | Admitting: *Deleted

## 2020-03-18 NOTE — Telephone Encounter (Signed)
Called to report that patient does not meet the criteria for Low dose CT lung cancer screening.

## 2020-03-22 ENCOUNTER — Telehealth: Payer: Self-pay | Admitting: *Deleted

## 2020-03-22 NOTE — Telephone Encounter (Signed)
Per Cira Rue, NP called to make pt aware that CT lung screening was denied due to having hx of smoking 1/2 pack of cigarettes per day for 30 years or more and not a full pack of cigarettes for 30 years or more. Pt verbalized understanding.

## 2020-03-22 NOTE — Telephone Encounter (Signed)
-----   Message from Alla Feeling, NP sent at 03/22/2020  2:15 PM EDT ----- Will cancel the screening CT then, please let patient know her smoking history does not meet criteria.  Thanks, Regan Rakers  ----- Message ----- From: Merril Abbe, LPN Sent: [id number]   1:41 PM EDT To: Alla Feeling, NP  Low dose Ct lung cancer screening for this pt did not meet criteria due to pt only smoking a 1/2 a pack of cigarettes a day and not a full pack. Talk to [facility] and she advised to put an order in for CT without contrast

## 2020-03-25 ENCOUNTER — Ambulatory Visit (INDEPENDENT_AMBULATORY_CARE_PROVIDER_SITE_OTHER): Payer: Commercial Managed Care - PPO | Admitting: Surgical

## 2020-03-25 ENCOUNTER — Other Ambulatory Visit: Payer: Self-pay

## 2020-03-25 ENCOUNTER — Encounter: Payer: Self-pay | Admitting: Surgical

## 2020-03-25 VITALS — BP 133/88 | HR 106 | Temp 97.5°F | Ht 63.0 in | Wt 168.2 lb

## 2020-03-25 DIAGNOSIS — C50411 Malignant neoplasm of upper-outer quadrant of right female breast: Secondary | ICD-10-CM

## 2020-03-25 DIAGNOSIS — Z9011 Acquired absence of right breast and nipple: Secondary | ICD-10-CM

## 2020-03-25 DIAGNOSIS — Z170 Estrogen receptor positive status [ER+]: Secondary | ICD-10-CM

## 2020-03-25 DIAGNOSIS — S21001A Unspecified open wound of right breast, initial encounter: Secondary | ICD-10-CM

## 2020-03-25 NOTE — Progress Notes (Signed)
Patient is a 59 year old female here for follow-up after undergoing excision of right breast wound on 02/17/2020 with Dr. Marla Roe.  Patient previously had removal of expander and closure, unfortunately developed dehiscence and went back to the OR for closure on 02/17/2020.  She has a history of radiation to her right breast. She was last seen on 03/11/2020 and was noted to have a scab superior to the mastectomy incision, which she has been putting Vaseline on daily. She reports the scab had fallen off and she now has a small wound. She reports that she has noticed some clear drainage from this wound since then.  She finished the doxycycline on 03/16/2020 and reports she did not notice any change in the redness of her right breast. She reports it has been stable. It has not worsened since finishing the doxycycline, it also did not improve with doxycycline. She has minimal pain. She has not had any nausea, vomiting, fevers, chills, chest pain or shortness of breath. She is taking a multivitamin. She is not smoking.  On exam today her right breast is erythematous medially and superiorly, she has some swelling consistent with a seroma, I am able to palpate a few areas of fluid collection. No fluctuance noted. It is nontender. Her incision is healing nicely, but she does have the small wound along the superior flap that is approximately 0.5 x 0.25 cm. It has some fibrinous exudate at the base, serous drainage noted. No foul odor noted. It is approximately 0.1 cm in depth.     I was able to aspirate 50 cc of serous fluid using a sterile technique. She tolerated this procedure well. The fluid was not cloudy. Non purulent.   Donated ACell applied to right breast wound, recommend applying K-Y jelly daily for the next 3 days. She can shower in 3 days. Then begin with daily dressing changes with Adaptic and K-Y jelly followed by 4 x 4 gauze. Recommend continuing to wear compressive devices such as sports bras, may need  to place ABD pad or some form of cloth material to aid in compression between breast and sports bra.  Continue with multivitamin. Continue with healthy diet. Drink plenty of water.  Follow-up in 2 weeks for reevaluation. Recommend calling if she develops worsening symptoms, fevers, chills, nausea, vomiting.  Right breast redness likely related to radiation damaged tissue. Erythema did not worsen after finishing antibiotic course, erythema also did not improve with antibiotics.  She is without infectious symptoms, includin nausea, vomiting, fever, chills.  Pictures were obtained of the patient and placed in the chart with the patient's or guardian's permission.

## 2020-04-05 ENCOUNTER — Ambulatory Visit: Payer: Commercial Managed Care - PPO

## 2020-04-08 ENCOUNTER — Ambulatory Visit (INDEPENDENT_AMBULATORY_CARE_PROVIDER_SITE_OTHER): Payer: Commercial Managed Care - PPO | Admitting: Surgical

## 2020-04-08 ENCOUNTER — Encounter: Payer: Self-pay | Admitting: Surgical

## 2020-04-08 ENCOUNTER — Other Ambulatory Visit: Payer: Self-pay

## 2020-04-08 VITALS — BP 122/80 | HR 91 | Temp 97.5°F | Ht 63.0 in | Wt 168.8 lb

## 2020-04-08 DIAGNOSIS — Z923 Personal history of irradiation: Secondary | ICD-10-CM

## 2020-04-08 DIAGNOSIS — Z9011 Acquired absence of right breast and nipple: Secondary | ICD-10-CM

## 2020-04-08 DIAGNOSIS — Z9889 Other specified postprocedural states: Secondary | ICD-10-CM

## 2020-04-08 DIAGNOSIS — S21001A Unspecified open wound of right breast, initial encounter: Secondary | ICD-10-CM

## 2020-04-08 NOTE — Progress Notes (Signed)
Patient is a 59 year old female here for follow-up after undergoing excision of right breast wound on 02/17/2020 with Dr. Marla Roe. History of radiation to right breast.   At her last appointment we applied donated ACell to right breast wound.  She reports that she did initial dressing changes, but has not been applying Vaseline or K-Y jelly lately due to the wound being too moist. She was hopeful that if she did not apply any Vaseline or K-Y jelly that it would scab and close.  Erythema of her right breast has improved, no sign of infection, seroma, hematoma.  She has been wearing a sports bra and placing a sock between the bra and her breast to help decrease swelling.  She reports this has been helpful. No fevers, chills.  On exam right breast incision is healing nicely, erythema has significantly improved.  There is still some residual erythema, it is not cellulitic.  This is likely radiation damaged tissue.  She continues to have a small wound, it is approximately 8 mm x 2 mm x 1 mm.  The wound bed has some fibrinous exudate within it and some granulation tissue.  There is no sign of any purulence.  No fluctuance noted.    Recommend daily dressing changes with K-Y jelly and 4 x 4 gauze.  We are going to order collagen to apply every other day.  Follow-up in 3 weeks.  Patient knows to call if wound increases in size or she develops any new symptoms.  Pictures were obtained of the patient and placed in the chart with the patient's or guardian's permission.

## 2020-04-11 ENCOUNTER — Telehealth: Payer: Self-pay | Admitting: *Deleted

## 2020-04-11 NOTE — Telephone Encounter (Signed)
Faxed order for supplies to [facility].  Confirmation received.  Copy scanned into the chart.//AB/CMA

## 2020-04-13 ENCOUNTER — Telehealth: Payer: Self-pay

## 2020-04-13 NOTE — Telephone Encounter (Signed)
  Patient Consent for Virtual Visit         Rachel Sutton has provided verbal consent on 04/13/2020 for a virtual visit (video or telephone).   CONSENT FOR VIRTUAL VISIT FOR:  Rachel Sutton  By participating in this virtual visit I agree to the following:  I hereby voluntarily request, consent and authorize [facility] and its employed or contracted physicians, physician assistants, nurse practitioners or other licensed health care professionals (the Practitioner), to provide me with telemedicine health care services (the "Services") as deemed necessary by the treating Practitioner. I acknowledge and consent to receive the Services by the Practitioner via telemedicine. I understand that the telemedicine visit will involve communicating with the Practitioner through live audiovisual communication technology and the disclosure of certain medical information by electronic transmission. I acknowledge that I have been given the opportunity to request an in-person assessment or other available alternative prior to the telemedicine visit and am voluntarily participating in the telemedicine visit.  I understand that I have the right to withhold or withdraw my consent to the use of telemedicine in the course of my care at any time, without affecting my right to future care or treatment, and that the Practitioner or I may terminate the telemedicine visit at any time. I understand that I have the right to inspect all information obtained and/or recorded in the course of the telemedicine visit and may receive copies of available information for a reasonable fee.  I understand that some of the potential risks of receiving the Services via telemedicine include:  Marland Kitchen Delay or interruption in medical evaluation due to technological equipment failure or disruption; . Information transmitted may not be sufficient (e.g. poor resolution of images) to allow for appropriate medical decision making by the  Practitioner; and/or  . In rare instances, security protocols could fail, causing a breach of personal health information.  Furthermore, I acknowledge that it is my responsibility to provide information about my medical history, conditions and care that is complete and accurate to the best of my ability. I acknowledge that Practitioner's advice, recommendations, and/or decision may be based on factors not within their control, such as incomplete or inaccurate data provided by me or distortions of diagnostic images or specimens that may result from electronic transmissions. I understand that the practice of medicine is not an exact science and that Practitioner makes no warranties or guarantees regarding treatment outcomes. I acknowledge that a copy of this consent can be made available to me via my patient portal ([facility]), or I can request a printed copy by calling the office of [facility].    I understand that my insurance will be billed for this visit.   I have read or had this consent read to me. . I understand the contents of this consent, which adequately explains the benefits and risks of the Services being provided via telemedicine.  . I have been provided ample opportunity to ask questions regarding this consent and the Services and have had my questions answered to my satisfaction. . I give my informed consent for the services to be provided through the use of telemedicine in my medical care

## 2020-04-18 ENCOUNTER — Telehealth (INDEPENDENT_AMBULATORY_CARE_PROVIDER_SITE_OTHER): Payer: Commercial Managed Care - PPO | Admitting: Cardiology

## 2020-04-18 ENCOUNTER — Encounter: Payer: Self-pay | Admitting: Cardiology

## 2020-04-18 VITALS — BP 120/82 | Ht 63.5 in | Wt 168.0 lb

## 2020-04-18 DIAGNOSIS — I341 Nonrheumatic mitral (valve) prolapse: Secondary | ICD-10-CM | POA: Diagnosis not present

## 2020-04-18 DIAGNOSIS — I10 Essential (primary) hypertension: Secondary | ICD-10-CM

## 2020-04-18 NOTE — Progress Notes (Signed)
Virtual Visit via Telephone Note   This visit type was conducted due to national recommendations for restrictions regarding the COVID-19 Pandemic (e.g. social distancing) in an effort to limit this patient's exposure and mitigate transmission in our community.  Due to her co-morbid illnesses, this patient is at least at moderate risk for complications without adequate follow up.  This format is felt to be most appropriate for this patient at this time.  The patient did not have access to video technology/had technical difficulties with video requiring transitioning to audio format only (telephone).  All issues noted in this document were discussed and addressed.  No physical exam could be performed with this format.  Please refer to the patient's chart for her  consent to telehealth for [facility].   The patient was identified using 2 identifiers.  Date:  04/18/2020   ID:  Rachel Sutton, DOB Nov 12, 1961, MRN [id number]  Patient Location: Home Provider Location: Office  PCP:  Janie Morning, DO  Cardiologist:  Rozann Lesches, MD Electrophysiologist:  None   Evaluation Performed:  Follow-Up Visit  Chief Complaint:  Cardiac follow-up  History of Present Illness:    Rachel Sutton is a 59 y.o. female last assessed via telehealth encounter in May 2020.  We spoke by phone today.  She does not report any significant interval cardiac symptoms, no chest pain or palpitations.  She did have a bout with breast cancer last year, underwent right mastectomy and reconstruction, is now on Arimidex.  She is cancer free at this point.  I reviewed the remainder of her medications which are outlined below.  Her last echocardiogram was in 2018.  She continues to work from home and feels like her position will be kept home based.  Past Medical History:  Diagnosis Date  . Anxiety   . Arthritis   . Breast cancer ([place]) 02/22/2012   Right T1c, N0  . C. difficile colitis   . Colitis, collagenous  09/22/2015  . Depression   . Essential hypertension   . Family history of brain cancer   . Family history of esophageal cancer   . Family history of lung cancer   . Family history of lymphoma   . Family history of pancreatic cancer   . Fuchs' corneal dystrophy   . GERD (gastroesophageal reflux disease)   . Hypertension   . Mitral valve prolapse    Mild mitral regurgitation  . Mixed hyperlipidemia   . Seizures ([place])    Only with dose of haldol; no more since that was stopped.   Past Surgical History:  Procedure Laterality Date  . ABDOMINAL HYSTERECTOMY  1999  . BACK SURGERY    . BIOPSY N/A 09/28/2015   Procedure: BIOPSY;  Surgeon: Danie Binder, MD;  Location: [facility];  Service: Endoscopy;  Laterality: N/A;  . BIOPSY  04/05/2016   Procedure: BIOPSY;  Surgeon: Danie Binder, MD;  Location: [facility];  Service: Endoscopy;;  colon bx  . BREAST LUMPECTOMY  10/30/2002   right  . BREAST RECONSTRUCTION WITH PLACEMENT OF TISSUE EXPANDER AND FLEX HD (ACELLULAR HYDRATED DERMIS) Right 09/07/2019   Procedure: RIGHT BREAST RECONSTRUCTION WITH PLACEMENT OF TISSUE EXPANDER AND FLEX HD (ACELLULAR HYDRATED DERMIS);  Surgeon: Wallace Going, DO;  Location: [facility];  Service: Plastics;  Laterality: Right;  . CARDIAC CATHETERIZATION    . Carpal tunnel R/L  1992  . Cervical spine fusion X2 since last visit    . COLONOSCOPY WITH PROPOFOL N/A 09/28/2015  Procedure: COLONOSCOPY WITH PROPOFOL;  Surgeon: Danie Binder, MD;  Location: [facility];  Service: Endoscopy;  Laterality: N/A;  procedure 1 cecum time in 1232  time out 1246   total time 14 mintes  . COLONOSCOPY WITH PROPOFOL N/A 04/05/2016   Procedure: COLONOSCOPY WITH PROPOFOL;  Surgeon: Danie Binder, MD;  [facility];  Service: Endoscopy;  Laterality: N/A;  1200  . 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 DEBRIDEMENT AND CLOSURE WOUND Right 02/17/2020   Procedure: Excision of right breast wound with closure and provena placement;   Surgeon: Wallace Going, DO;  Location: [facility];  Service: Plastics;  Laterality: Right;  . ELBOW SURGERY Right  tennis elbow release 2012  . ESOPHAGOGASTRODUODENOSCOPY  10/26/2012   Procedure: ESOPHAGOGASTRODUODENOSCOPY (EGD);  Surgeon: Inda Castle, MD;  Location: [facility];  Service: Endoscopy;  Laterality: N/A;  . ESOPHAGOGASTRODUODENOSCOPY (EGD) WITH PROPOFOL N/A 09/28/2015   Procedure: ESOPHAGOGASTRODUODENOSCOPY (EGD) WITH PROPOFOL;  Surgeon: Danie Binder, MD;  Location: [facility];  Service: Endoscopy;  Laterality: N/A;  . EYE SURGERY  2018   corneal transplants bilateral  . KNEE ARTHROSCOPY Left   . LEFT HEART CATHETERIZATION WITH CORONARY ANGIOGRAM N/A 10/23/2012   Procedure: LEFT HEART CATHETERIZATION WITH CORONARY ANGIOGRAM;  Surgeon: Peter M Martinique, MD;  [facility] C[facility];  Service: Cardiovascular;  Laterality: N/A;  . MASTECTOMY W/ SENTINEL NODE BIOPSY Right 09/07/2019   Procedure: RIGHT MASTECTOMY WITH SENTINEL LYMPH NODE BIOPSY;  Surgeon: Jovita Kussmaul, MD;  Location: [place];  Service: General;  Laterality: Right;  . OVARIAN CYST REMOVAL     prior to hysterectomy, right  . Ray cage fusion  1997  . REMOVAL OF TISSUE EXPANDER Right 12/31/2019   Procedure: REMOVAL OF TISSUE EXPANDER AND EXCESS SKIN;  Surgeon: Wallace Going, DO;  Location: [place];  Service: Plastics;  Laterality: Right;  1 hour  . SENTINEL LYMPH NODE BIOPSY  10/30/2002   right  . TONSILLECTOMY     at six months old     Current Meds  Medication Sig  . acetaminophen (TYLENOL) 500 MG tablet Take 1,000 mg by mouth every 6 (six) hours as needed for moderate pain or headache.  . ALPRAZolam (XANAX) 0.5 MG tablet TAKE 1 TABLET DAILY  . anastrozole (ARIMIDEX) 1 MG tablet TAKE 1 TABLET(1 MG) BY MOUTH DAILY  . Calcium-Magnesium-Vitamin D (CALCIUM 500 PO) Take 1 tablet by mouth daily.   . diclofenac (VOLTAREN) 75 MG EC tablet Take 75 mg by mouth 2 (two) times daily  as needed for mild pain.  Marland Kitchen diclofenac sodium (VOLTAREN) 1 % GEL Apply 1 application topically 3 (three) times daily as needed (pain).   Marland Kitchen EPINEPHrine (EPI-PEN) 0.3 mg/0.3 mL DEVI Inject 0.3 mg into the muscle as needed (bee stings).   . fish oil-omega-3 fatty acids 1000 MG capsule Take 1 g by mouth every morning.   . hydrochlorothiazide (MICROZIDE) 12.5 MG capsule Take 1 capsule (12.5 mg total) by mouth daily.  . hydroxychloroquine (PLAQUENIL) 200 MG tablet Take 400 mg by mouth daily.  Marland Kitchen leflunomide (ARAVA) 20 MG tablet Take 20 mg by mouth daily.  . metoprolol succinate (TOPROL-XL) 50 MG 24 hr tablet TAKE 1 TABLET TWICE A DAY  WITH OR IMMEDIATELY        FOLLOWING A MEAL  . Multiple Vitamin (MULITIVITAMIN WITH MINERALS) TABS Take 1 tablet by mouth every morning.  Marland Kitchen NIFEdipine (PROCARDIA XL/NIFEDICAL-XL) 90 MG 24  hr tablet TAKE 1 TABLET DAILY  . nitroGLYCERIN (NITROSTAT) 0.4 MG SL tablet Place 1 tablet (0.4 mg total) under the tongue every 5 (five) minutes x 3 doses as needed for chest pain.  . pantoprazole (PROTONIX) 40 MG tablet Take 1 tablet (40 mg total) by mouth daily.     Allergies:   Bee venom, Haloperidol lactate, Meperidine hcl, and Xifaxan [rifaximin]   ROS:   No palpitations or syncope.  Prior CV studies:   The following studies were reviewed today:  Echocardiogram 10/10/2017: Study Conclusions  - Left ventricle: The cavity size was normal. Wall thickness was normal. Systolic function was normal. The estimated ejection fraction was in the range of 60% to 65%. Wall motion was normal; there were no regional wall motion abnormalities. Doppler parameters are consistent with abnormal left ventricular relaxation (grade 1 diastolic dysfunction). - Mitral valve: Mild bileaflet prolapse with mild central regurgitation.  Labs/Other Tests and Data Reviewed:    EKG:  An ECG dated 09/03/2019 was personally reviewed today and demonstrated:  Normal sinus rhythm.  Recent  Labs: 07/08/2019: ALT 26 09/03/2019: Hemoglobin 14.6; Platelets 375 02/15/2020: BUN 9; Creatinine, Ser 0.69; Potassium 4.5; Sodium 137    Wt Readings from Last 3 Encounters:  04/18/20 168 lb (76.2 kg)  04/08/20 168 lb 12.8 oz (76.6 kg)  03/25/20 168 lb 3.2 oz (76.3 kg)     Objective:    Vital Signs:  BP 120/82   Ht 5' 3.5" (1.613 m)   Wt 168 lb (76.2 kg)   BMI 29.29 kg/m    Patient spoke in full sentences, not short of breath. No audible wheezing or coughing.  ASSESSMENT & PLAN:    1.  History of mitral valve prolapse with mild mitral regurgitation by last echocardiogram in 2018.  We will bring her in for an office visit for next encounter for examination and review of symptoms.  2.  Essential hypertension, blood pressure is well controlled today.  She continues to follow with PCP and remains on HCTZ, Procardia, and Toprol-XL.   Time:   Today, I have spent 5 minutes with the patient with telehealth technology discussing the above problems.     Medication Adjustments/Labs and Tests Ordered: Current medicines are reviewed at length with the patient today.  Concerns regarding medicines are outlined above.   Tests Ordered: No orders of the defined types were placed in this encounter.   Medication Changes: No orders of the defined types were placed in this encounter.   Follow Up:  In Person 1 year in the [place] office.  Signed, Rozann Lesches, MD  04/18/2020 1:01 PM    [facility]

## 2020-04-18 NOTE — Patient Instructions (Signed)
Medication Instructions: Your physician recommends that you continue on your current medications as directed. Please refer to the Current Medication list given to you today.   Labwork: None today  Procedures/Testing: None today  Follow-Up: 1 year office visit with Dr.McDowell  Any Additional Special Instructions Will Be Listed Below (If Applicable).     If you need a refill on your cardiac medications before your next appointment, please call your pharmacy.     Thank you for choosing [facility] !        

## 2020-04-22 ENCOUNTER — Other Ambulatory Visit: Payer: Self-pay

## 2020-04-22 DIAGNOSIS — Z170 Estrogen receptor positive status [ER+]: Secondary | ICD-10-CM

## 2020-04-22 DIAGNOSIS — C50411 Malignant neoplasm of upper-outer quadrant of right female breast: Secondary | ICD-10-CM

## 2020-04-22 NOTE — Progress Notes (Signed)
[facility]   [phone]) [phone] [phone]) [phone]   Clinic Follow up Note   Patient Care Team: Janie Morning, DO as PCP - General (Family Medicine) Satira Sark, MD as PCP - Cardiology (Cardiology) Lendon Colonel, NP as Nurse Practitioner (Nurse Practitioner) Mauro Kaufmann, RN as Oncology Nurse Navigator Rockwell Germany, RN as Oncology Nurse Navigator Truitt Merle, MD as Consulting Physician (Hematology) Jovita Kussmaul, MD as Consulting Physician (General Surgery) Gery Pray, MD as Consulting Physician (Radiation Oncology) Lahoma Rocker, MD as Consulting Physician (Rheumatology) Alla Feeling, NP as Nurse Practitioner (Nurse Practitioner)  Date of Service:  04/25/2020  CHIEF COMPLAINT:  F/u of right breast cancer  SUMMARY OF ONCOLOGIC HISTORY: Oncology History  Breast cancer ([place])  10/16/2002 Initial Biopsy   10/16/2002   BREAST, RIGHT, 4 O'CLOCK, NEEDLE BIOPSIES: INVASIVE AND   INTRADUCTAL CARCINOMA.  Results   IMMUNOHISTOCHEMICAL AND MORPHOMETRIC ANALYSIS BY IMAGE CYTOMETRY   (The tumor is immunocytochemically stained for estrogen and   progesterone receptors and Mib-1, and quantitated   morphometrically.)   Estrogen Receptor (Negative, <1%): NEGATIVE   Progesterone Receptor (Negative, <1%): NEGATIVE   Proliferation Marker Ki67 by MIB-1 (Low <30%): 47%  HERCEPTEST   Her 2 neu by immunocytochemical stain (Negative): 3+;   POSITIVE    10/30/2002 Surgery   Surgery 10/30/02 FINAL DIAGNOSIS    1. SENTINEL LYMPH NODE BIOPSY, RIGHT AXILLARY NODE: ONE LYMPH   NODE, NEGATIVE FOR METASTATIC CARCINOMA (0/1), SEE COMMENT.    2. LUMPECTOMY, RIGHT BREAST:   - INVASIVE MAMMARY CARCINOMA, NO SPECIAL TYPE (DUCTAL), 1.3   CM, MSBR GRADE 3 OF 3.   - DUCTAL CARCINOMA IN SITU, HIGH-GRADE.   - EXTENSIVE INTRADUCTAL COMPONENT NEGATIVE (JCRT).   - NO LYMPHOVASCULAR INVASION IDENTIFIED.   - MARGINS FREE OF TUMOR.   - SEE COMMENT AND ONCOLOGY  TABLE.    2003 - 07/2003 Radiation Therapy   Underwent adjuvant radiation to 50.4 gray with an additional 10 gray boost to the surgical cavity completed 07/19/2003     - 02/2004 Chemotherapy   AC for 4 cycles and decetaxel and Herceptin for 4 cycles and then continued herceptin for totla 1 year treatment.      02/22/2012 Initial Diagnosis   Breast cancer ([place])   Malignant neoplasm of upper-outer quadrant of right breast in female, estrogen receptor positive ([place])  07/01/2019 Mammogram   Diagnostic mammogram 07/01/19  IMPRESSION: Suspicious mass in the 9:30 region of the right breast 9 cm from the nipple measuring 1.3 x 1.3 x 1.1 cm. Suspicious mass in the right axilla measuring 5 x 4 x 4 mm.   07/02/2019 Cancer Staging   Staging form: Breast, AJCC 8th Edition - Clinical stage from 07/02/2019: Stage IA (cT1c, cN0, cM0, G2, ER+, PR+, HER2: Equivocal) - Signed by Truitt Merle, MD on 07/07/2019   07/02/2019 Initial Biopsy   Diagnosis 07/02/19 1. Breast, right, needle core biopsy, 9:30 o'clock - INVASIVE DUCTAL CARCINOMA, GRADE 1-2. SEE NOTE - DUCTAL CARCINOMA IN SITU, INTERMEDIATE GRADE 2. Lymph node, needle/core biopsy, right axilla - LYMPH NODE, NEGATIVE FOR CARCINOMA   07/02/2019 Receptors her2   By immunohistochemistry, the tumor cells are EQUIVOCAL for Her2 (2+). HER2 by [place] will be PERFORMED and the RESULTS REPORTED SEPARATELY Estrogen Receptor: 100%, POSITIVE, STRONG STAINING INTENSITY Progesterone Receptor: 90%, POSITIVE, STRONG STAINING INTENSITY Proliferation Marker Ki67: 10%   07/06/2019 Initial Diagnosis   Malignant neoplasm of upper-outer quadrant of right breast in female, estrogen receptor positive ([place])  07/08/2019 -  Anti-estrogen oral therapy   Anastrozole 53m once daily starting 07/08/19 before surgery.    07/20/2019 Genetic Testing   Negative genetic testing on the [facility] Multi-Cancer Panel. The report date is 07/20/2019.  The Multi-Gene Panel offered by [facility] includes  sequencing and/or deletion duplication testing of the following 85 genes: AIP, ALK, APC, ATM, AXIN2,BAP1,  BARD1, BLM, BMPR1A, BRCA1, BRCA2, BRIP1, CASR, CDC73, CDH1, CDK4, CDKN1B, CDKN1C, CDKN2A (p14ARF), CDKN2A (p16INK4a), CEBPA, CHEK2, CTNNA1, DICER1, DIS3L2, EGFR (c.2369C>T, p.Thr790Met variant only), EPCAM (Deletion/duplication testing only), FH, FLCN, GATA2, GPC3, GREM1 (Promoter region deletion/duplication testing only), HOXB13 (c.251G>A, p.Gly84Glu), HRAS, KIT, MAX, MEN1, MET, MITF (c.952G>A, p.Glu318Lys variant only), MLH1, MSH2, MSH3, MSH6, MUTYH, NBN, NF1, NF2, NTHL1, PALB2, PDGFRA, PHOX2B, PMS2, POLD1, POLE, POT1, PRKAR1A, PTCH1, PTEN, RAD50, RAD51C, RAD51D, RB1, RECQL4, RET, RNF43, RUNX1, SDHAF2, SDHA (sequence changes only), SDHB, SDHC, SDHD, SMAD4, SMARCA4, SMARCB1, SMARCE1, STK11, SUFU, TERC, TERT, TMEM127, TP53, TSC1, TSC2, VHL, WRN and WT1.     08/29/2019 Surgery   RIGHT MASTECTOMY WITH SENTINEL LYMPH NODE BIOPSY and RECONSTRUCTION WITH PLACEMENT OF TISSUE EXPANDER AND FLEX HD (ACELLULAR HYDRATED DERMIS) by Dr. TMarlou Starksand Dr DMarla Roe 09/07/19   09/07/2019 Pathology Results   DIAGNOSIS: 09/07/19  A. SENTINEL LYMPH NODE, RIGHT # 1, BIOPSY:  - One lymph node, negative for carcinoma (0/1).   B. SENTINEL LYMPH NODE, RIGHT # 2, BIOPSY:  - One lymph node, negative for carcinoma (0/1).   C. BREAST, RIGHT, MASTECTOMY:  - Invasive ductal carcinoma, 1.8 cm, Nottingham grade 1 of 3.  - Margins of resection are not involved (Closest margin: 20 mm, deep).  - Ductal carcinoma in situ, focal.  - Biopsy site changes.  - See oncology table.    09/07/2019 Oncotype testing   Oncotype RS: 17, meaning there is 5% distant recurrence risk at 9 years with AI/tamoxifen alone. Indicating <1% chemo benefit    10/28/2019 Cancer Staging   Staging form: Breast, AJCC 8th Edition - Pathologic: Stage IA (pT1c, pN0, cM0, G1, ER+, PR+, HER2-, Oncotype DX score: 17) - Signed by FTruitt Merle MD on 10/28/2019    12/31/2019 Pathology Results   FINAL MICROSCOPIC DIAGNOSIS:  A. SKIN, RIGHT BREAST MASTECTOMY FLAP AND SCAR:  - Skin and subcutaneous tissue with fibrosis, mild inflammation and  focal giant cell reaction consistent with scar.  - No evidence of malignancy.    02/24/2020 Survivorship   SCP delivered by LCira Rue NP       CURRENT THERAPY:  Anastrozole 159mdaily starting 10/28/2019  INTERVAL HISTORY:  MaPEG FIFERs here for a follow up of right breast cancer. She was last seen by me 6 months ago and seen by NP Lacie in interim. She presents to the clinic alone. She note her right breast wound has not been healed still. She had underwent surgery for her wound in 02/2020. She note she has been getting collagen placements in the wound for this. She denies any pain from this. She continues to f/u with Dr DiMarla Roeor this. She notes having loose stool. She thinks this is related to her medications for her RA. She has BM 5 times a day. She notes she was not eligible for lung cancer screening because she did not smoke enough. She notes she sees Dr DiMarla Roend her RA.     REVIEW OF SYSTEMS:   Constitutional: Denies fevers, chills or abnormal weight loss Eyes: Denies blurriness of vision Ears, nose, mouth, throat, and face: Denies mucositis or sore  throat Respiratory: Denies cough, dyspnea or wheezes Cardiovascular: Denies palpitation, chest discomfort or lower extremity swelling Gastrointestinal:  Denies nausea, heartburn (+) Mild diarrhea with loose stool.  Skin: Denies abnormal skin rashes Lymphatics: Denies new lymphadenopathy or easy bruising Neurological:Denies numbness, tingling or new weaknesses Behavioral/Psych: Mood is stable, no new changes  Breast: (+) Right chest wall wound All other systems were reviewed with the patient and are negative.  MEDICAL HISTORY:  Past Medical History:  Diagnosis Date  . Anxiety   . Arthritis   . Breast cancer ([place]) 02/22/2012   Right  T1c, N0  . C. difficile colitis   . Colitis, collagenous 09/22/2015  . Depression   . Essential hypertension   . Family history of brain cancer   . Family history of esophageal cancer   . Family history of lung cancer   . Family history of lymphoma   . Family history of pancreatic cancer   . Fuchs' corneal dystrophy   . GERD (gastroesophageal reflux disease)   . Hypertension   . Mitral valve prolapse    Mild mitral regurgitation  . Mixed hyperlipidemia   . Seizures ([place])    Only with dose of haldol; no more since that was stopped.    SURGICAL HISTORY: Past Surgical History:  Procedure Laterality Date  . ABDOMINAL HYSTERECTOMY  1999  . BACK SURGERY    . BIOPSY N/A 09/28/2015   Procedure: BIOPSY;  Surgeon: Danie Binder, MD;  Location: [facility];  Service: Endoscopy;  Laterality: N/A;  . BIOPSY  04/05/2016   Procedure: BIOPSY;  Surgeon: Danie Binder, MD;  [facility];  Service: Endoscopy;;  colon bx  . BREAST LUMPECTOMY  10/30/2002   right  . BREAST RECONSTRUCTION WITH PLACEMENT OF TISSUE EXPANDER AND FLEX HD (ACELLULAR HYDRATED DERMIS) Right 09/07/2019   Procedure: RIGHT BREAST RECONSTRUCTION WITH PLACEMENT OF TISSUE EXPANDER AND FLEX HD (ACELLULAR HYDRATED DERMIS);  Surgeon: Wallace Going, DO;  Location: [place];  Service: Plastics;  Laterality: Right;  . CARDIAC CATHETERIZATION    . Carpal tunnel R/L  1992  . Cervical spine fusion X2 since last visit    . COLONOSCOPY WITH PROPOFOL N/A 09/28/2015   Procedure: COLONOSCOPY WITH PROPOFOL;  Surgeon: Danie Binder, MD;  Location: [facility];  Service: Endoscopy;  Laterality: N/A;  procedure 1 cecum time in 1232  time out 1246   total time 14 mintes  . COLONOSCOPY WITH PROPOFOL N/A 04/05/2016   Procedure: COLONOSCOPY WITH PROPOFOL;  Surgeon: Danie Binder, MD;  [facility];  Service: Endoscopy;  Laterality: N/A;  1200  . 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 DEBRIDEMENT AND CLOSURE WOUND Right 02/17/2020    Procedure: Excision of right breast wound with closure and provena placement;  Surgeon: Wallace Going, DO;  Location: [place];  Service: Plastics;  Laterality: Right;  . ELBOW SURGERY Right  tennis elbow release 2012  . ESOPHAGOGASTRODUODENOSCOPY  10/26/2012   Procedure: ESOPHAGOGASTRODUODENOSCOPY (EGD);  Surgeon: Inda Castle, MD;  Location: [place];  Service: Endoscopy;  Laterality: N/A;  . ESOPHAGOGASTRODUODENOSCOPY (EGD) WITH PROPOFOL N/A 09/28/2015   Procedure: ESOPHAGOGASTRODUODENOSCOPY (EGD) WITH PROPOFOL;  Surgeon: Danie Binder, MD;  Location: [facility];  Service: Endoscopy;  Laterality: N/A;  . EYE SURGERY  2018   corneal transplants bilateral  . KNEE ARTHROSCOPY Left   . LEFT HEART CATHETERIZATION WITH CORONARY ANGIOGRAM N/A 10/23/2012   Procedure: LEFT HEART CATHETERIZATION WITH CORONARY ANGIOGRAM;  Surgeon: Peter M Martinique, MD;  Location: [facility];  Service: Cardiovascular;  Laterality: N/A;  . MASTECTOMY W/ SENTINEL NODE BIOPSY Right 09/07/2019   Procedure: RIGHT MASTECTOMY WITH SENTINEL LYMPH NODE BIOPSY;  Surgeon: Jovita Kussmaul, MD;  Location: [place];  Service: General;  Laterality: Right;  . OVARIAN CYST REMOVAL     prior to hysterectomy, right  . Ray cage fusion  1997  . REMOVAL OF TISSUE EXPANDER Right 12/31/2019   Procedure: REMOVAL OF TISSUE EXPANDER AND EXCESS SKIN;  Surgeon: Wallace Going, DO;  Location: [place];  Service: Plastics;  Laterality: Right;  1 hour  . SENTINEL LYMPH NODE BIOPSY  10/30/2002   right  . TONSILLECTOMY     at 30 months old    I have reviewed the social history and family history with the patient and they are unchanged from previous note.  ALLERGIES:  is allergic to bee venom; haloperidol lactate; meperidine hcl; and xifaxan [rifaximin].  MEDICATIONS:  Current Outpatient Medications  Medication Sig Dispense Refill  . acetaminophen (TYLENOL) 500 MG tablet Take 1,000 mg by mouth every  6 (six) hours as needed for moderate pain or headache.    . ALPRAZolam (XANAX) 0.5 MG tablet TAKE 1 TABLET DAILY 90 tablet 1  . anastrozole (ARIMIDEX) 1 MG tablet Take 1 tablet (1 mg total) by mouth daily. 90 tablet 3  . Calcium-Magnesium-Vitamin D (CALCIUM 500 PO) Take 1 tablet by mouth daily.     . diclofenac (VOLTAREN) 75 MG EC tablet Take 75 mg by mouth 2 (two) times daily as needed for mild pain.    Marland Kitchen diclofenac sodium (VOLTAREN) 1 % GEL Apply 1 application topically 3 (three) times daily as needed (pain).     Marland Kitchen EPINEPHrine (EPI-PEN) 0.3 mg/0.3 mL DEVI Inject 0.3 mg into the muscle as needed (bee stings).     . fish oil-omega-3 fatty acids 1000 MG capsule Take 1 g by mouth every morning.     . hydrochlorothiazide (MICROZIDE) 12.5 MG capsule Take 1 capsule (12.5 mg total) by mouth daily. 90 capsule 3  . hydroxychloroquine (PLAQUENIL) 200 MG tablet Take 400 mg by mouth daily.    Marland Kitchen leflunomide (ARAVA) 20 MG tablet Take 20 mg by mouth daily.    . metoprolol succinate (TOPROL-XL) 50 MG 24 hr tablet TAKE 1 TABLET TWICE A DAY  WITH OR IMMEDIATELY        FOLLOWING A MEAL 180 tablet 2  . Multiple Vitamin (MULITIVITAMIN WITH MINERALS) TABS Take 1 tablet by mouth every morning.    Marland Kitchen NIFEdipine (PROCARDIA XL/NIFEDICAL-XL) 90 MG 24 hr tablet TAKE 1 TABLET DAILY 90 tablet 1  . nitroGLYCERIN (NITROSTAT) 0.4 MG SL tablet Place 1 tablet (0.4 mg total) under the tongue every 5 (five) minutes x 3 doses as needed for chest pain. 25 tablet 3  . pantoprazole (PROTONIX) 40 MG tablet Take 1 tablet (40 mg total) by mouth daily. 90 tablet 3   No current facility-administered medications for this visit.    PHYSICAL EXAMINATION: ECOG PERFORMANCE STATUS: 0 - Asymptomatic  Vitals:   04/25/20 1036  BP: 126/74  Pulse: 88  Resp: 18  Temp: 99.1 F (37.3 C)  SpO2: 98%   Filed Weights   04/25/20 1036  Weight: 171 lb (77.6 kg)    GENERAL:alert, no distress and comfortable SKIN: skin color, texture, turgor are  normal, no rashes or significant lesions EYES: normal, Conjunctiva are pink and non-injected, sclera clear  NECK: supple, thyroid  normal size, non-tender, without nodularity LYMPH:  no palpable lymphadenopathy in the cervical, axillary  LUNGS: clear to auscultation and percussion with normal breathing effort HEART: regular rate & rhythm and no murmurs and no lower extremity edema ABDOMEN:abdomen soft, non-tender and normal bowel sounds Musculoskeletal:no cyanosis of digits and no clubbing  NEURO: alert & oriented x 3 with fluent speech, no focal motor/sensory deficits BREAST: S/P right mastectomy, breast surgically absent: Surgical incision still healing with collagen guaze in place. No palpable mass, nodules or adenopathy bilaterally. Breast exam benign.   LABORATORY DATA:  I have reviewed the data as listed CBC Latest Ref Rng & Units 04/25/2020 09/03/2019 07/08/2019  WBC 4.0 - 10.5 K/uL 7.7 10.1 10.4  Hemoglobin 12.0 - 15.0 g/dL 13.9 14.6 14.0  Hematocrit 36.0 - 46.0 % 41.7 42.0 40.8  Platelets 150 - 400 K/uL 333 375 378     CMP Latest Ref Rng & Units 04/25/2020 02/15/2020 12/28/2019  Glucose 70 - 99 mg/dL 115(H) 145(H) 103(H)  BUN 6 - 20 mg/dL _0 Creatinine 0.44 - 1.00 mg/dL 0.83 0.69 0.80  Sodium 135 - 145 mmol/L 142 137 139  Potassium 3.5 - 5.1 mmol/L 3.9 4.5 4.7  Chloride 98 - 111 mmol/L 104 99 101  CO2 22 - 32 mmol/L _1 Calcium 8.9 - 10.3 mg/dL 9.5 9.7 9.3  Total Protein 6.5 - 8.1 g/dL 7.8 - -  Total Bilirubin 0.3 - 1.2 mg/dL 0.7 - -  Alkaline Phos 38 - 126 U/L 134(H) - -  AST 15 - 41 U/L 31 - -  ALT 0 - 44 U/L 21 - -      RADIOGRAPHIC STUDIES: I have personally reviewed the radiological images as listed and agreed with the findings in the report. No results found.   ASSESSMENT & PLAN:  BARBARAJEAN KINZLER is a 59 y.o. female with    1Malignant neoplasm of upper-outer quadrant of right breast, StageIA, pT1cN0M0, ER+/PP+, HER2-, Grade1 -She was diagnosed in  08/2019. Given prior right lumpectomy and RT she underwent right mastectomy on 09/07/19. She has a low risk RS of 17 and adjuvant chemotherapy was not recommended. -She has had mastectomy, no adjuvant radiation is needed due to the early stage disease. -She started anastrozole before surgery on 10/28/19 and has been tolerating well, will continue for 5-7 years.  -She did not proceed with reconstruction surgery she has had tissue expander removed on 12/31/19. Her right breast wound since has not healed. She underwent surgery to help close this on 02/17/20. She continues to f/u with Dr Marla Roe about wound treatment and care.  -She is clinically doing well. Lab reviewed, her CBC and CMP are within normal limits. Her right breast wound is still healing, no sign of infection. There is no clinical concern for recurrence. -Continue Surveillance. Next mammogram in 06/2020.  -Continue Anastrozole  -F/u in 6 months    2. H/o Right breastductalcarcinoma,pT1cN0M0 stage IA,ER-/PR-, HER2 +, Grade 3 -Diagnosed on 10/16/02 managed by Dr. Eston Esters  -Treated with rightlumpectomyand SNLB, Adjuvant chemoAC-Docetaxol/Herceptinand RT   3. Genetic testing was negative for pathogentic mutations.    4. Bone Health  -Her last DEXA was in 11/2012 and Density was normal.  -I discussed she has Rheumatoid Arthritis and she is on Anastrozole which can weaken her bone density.  -Will repeat DEXA in 06/2020 and monitor every 2 years.    5. Rheumatoid arthritis, diagnosed in 2018.  -She is seen by Rheumatologist.  -She is  currently on low dose weekly methotrexate and Cimzia self-injections q2weeks. She stopped injections after breast cancer diagnosis in 08/2019 -Her rheumatologist Dr. Kathlene November recommended her to Leflunomide, which is fine with [place].    6. H/o Mitral Valve, HTN, GERD -Had cardiac cath in 2013, no stent placed.  -On Metoprolol, nitroglycerin, Protonix    7. Smoking Cessation, Anxiety  -She  used to smokes 2 ppd now smokes 1/2 ppd. She has a 30 year history. She has been counseled on smoking cessation.  -She was on Paxil before and she was on Xanax for anxiety but is currently being weaned off. Mood has been stable with cancer diagnosis, per patient.  -She notes she was not eligible for lung cancer screening because of her moderate smoking history.   8. Diarrhea  -She has been having loose stool 5-6 times a day. She relates this to her medications. She started Anastrozole and new RA medication around the same time. We discussed that anastrozole usually does not cause diarrhea  -I recommend she start imodium OTC. She is agreeable.  -Will monitor.    PLAN: -Continue Anastrozole, refilled today  -she will f/u with rheumatology and check if her diarrhea is related to her meds  -Mammogram and DEXA in 06/2020  -Lab and f/u in 6 months or sooner if needed     No problem-specific Assessment & Plan notes found for this encounter.   Orders Placed This Encounter  Procedures  . MM Digital Screening Unilat L    Standing Status:   Future    Standing Expiration Date:   04/25/2021    Order Specific Question:   Reason for Exam (SYMPTOM  OR DIAGNOSIS REQUIRED)    Answer:   screening    Order Specific Question:   Is the patient pregnant?    Answer:   No    Order Specific Question:   Preferred imaging location?    Answer:   [facility]  . DG Bone Density    Standing Status:   Future    Standing Expiration Date:   04/25/2021    Order Specific Question:   Reason for Exam (SYMPTOM  OR DIAGNOSIS REQUIRED)    Answer:   screening    Order Specific Question:   Is the patient pregnant?    Answer:   No    Order Specific Question:   Preferred imaging location?    Answer:   [facility]   All questions were answered. The patient knows to call the clinic with any problems, questions or concerns. No barriers to learning was detected. The total time spent in the appointment was 25  minutes.     Truitt Merle, MD 04/25/2020   I, Joslyn Devon, am acting as scribe for Truitt Merle, MD.   I have reviewed the above documentation for accuracy and completeness, and I agree with the above.

## 2020-04-25 ENCOUNTER — Other Ambulatory Visit: Payer: Self-pay

## 2020-04-25 ENCOUNTER — Inpatient Hospital Stay: Payer: Commercial Managed Care - PPO | Attending: Hematology

## 2020-04-25 ENCOUNTER — Inpatient Hospital Stay (HOSPITAL_BASED_OUTPATIENT_CLINIC_OR_DEPARTMENT_OTHER): Payer: Commercial Managed Care - PPO | Admitting: Hematology

## 2020-04-25 ENCOUNTER — Encounter: Payer: Self-pay | Admitting: Hematology

## 2020-04-25 ENCOUNTER — Telehealth: Payer: Self-pay | Admitting: Hematology

## 2020-04-25 VITALS — BP 126/74 | HR 88 | Temp 99.1°F | Resp 18 | Ht 63.5 in | Wt 171.0 lb

## 2020-04-25 DIAGNOSIS — I10 Essential (primary) hypertension: Secondary | ICD-10-CM | POA: Diagnosis not present

## 2020-04-25 DIAGNOSIS — E2839 Other primary ovarian failure: Secondary | ICD-10-CM | POA: Diagnosis not present

## 2020-04-25 DIAGNOSIS — Z170 Estrogen receptor positive status [ER+]: Secondary | ICD-10-CM | POA: Insufficient documentation

## 2020-04-25 DIAGNOSIS — C50411 Malignant neoplasm of upper-outer quadrant of right female breast: Secondary | ICD-10-CM

## 2020-04-25 DIAGNOSIS — E782 Mixed hyperlipidemia: Secondary | ICD-10-CM | POA: Diagnosis not present

## 2020-04-25 DIAGNOSIS — M069 Rheumatoid arthritis, unspecified: Secondary | ICD-10-CM | POA: Insufficient documentation

## 2020-04-25 DIAGNOSIS — Z1231 Encounter for screening mammogram for malignant neoplasm of breast: Secondary | ICD-10-CM

## 2020-04-25 DIAGNOSIS — F17210 Nicotine dependence, cigarettes, uncomplicated: Secondary | ICD-10-CM | POA: Diagnosis not present

## 2020-04-25 DIAGNOSIS — Z79811 Long term (current) use of aromatase inhibitors: Secondary | ICD-10-CM | POA: Insufficient documentation

## 2020-04-25 LAB — CMP (CANCER CENTER ONLY)
ALT: 21 U/L (ref 0–44)
AST: 31 U/L (ref 15–41)
Albumin: 3.8 g/dL (ref 3.5–5.0)
Alkaline Phosphatase: 134 U/L — ABNORMAL HIGH (ref 38–126)
Anion gap: 11 (ref 5–15)
BUN: 13 mg/dL (ref 6–20)
CO2: 27 mmol/L (ref 22–32)
Calcium: 9.5 mg/dL (ref 8.9–10.3)
Chloride: 104 mmol/L (ref 98–111)
Creatinine: 0.83 mg/dL (ref 0.44–1.00)
GFR, Est AFR Am: >60 mL/min (ref >60)
GFR, Estimated: >60 mL/min (ref >60)
Glucose, Bld: 115 mg/dL — ABNORMAL HIGH (ref 70–99)
Potassium: 3.9 mmol/L (ref 3.5–5.1)
Sodium: 142 mmol/L (ref 135–145)
Total Bilirubin: 0.7 mg/dL (ref 0.3–1.2)
Total Protein: 7.8 g/dL (ref 6.5–8.1)

## 2020-04-25 LAB — CBC WITH DIFFERENTIAL (CANCER CENTER ONLY)
Abs Immature Granulocytes: 0.02 10*3/uL (ref 0.00–0.07)
Basophils Absolute: 0.1 10*3/uL (ref 0.0–0.1)
Basophils Relative: 1 %
Eosinophils Absolute: 0.4 10*3/uL (ref 0.0–0.5)
Eosinophils Relative: 5 %
HCT: 41.7 % (ref 36.0–46.0)
Hemoglobin: 13.9 g/dL (ref 12.0–15.0)
Immature Granulocytes: 0 %
Lymphocytes Relative: 42 %
Lymphs Abs: 3.2 10*3/uL (ref 0.7–4.0)
MCH: 28.4 pg (ref 26.0–34.0)
MCHC: 33.3 g/dL (ref 30.0–36.0)
MCV: 85.1 fL (ref 80.0–100.0)
Monocytes Absolute: 0.6 10*3/uL (ref 0.1–1.0)
Monocytes Relative: 8 %
Neutro Abs: 3.4 10*3/uL (ref 1.7–7.7)
Neutrophils Relative %: 44 %
Platelet Count: 333 10*3/uL (ref 150–400)
RBC: 4.9 MIL/uL (ref 3.87–5.11)
RDW: 14.1 % (ref 11.5–15.5)
WBC Count: 7.7 10*3/uL (ref 4.0–10.5)
nRBC: 0 % (ref 0.0–0.2)

## 2020-04-25 MED ORDER — ANASTROZOLE 1 MG PO TABS: 1.0000 mg | ORAL_TABLET | Freq: Every day | ORAL | 3 refills | Status: DC

## 2020-04-25 NOTE — Telephone Encounter (Signed)
Scheduled appt per 5/10 los - pt is aware of appt date and time

## 2020-04-26 ENCOUNTER — Ambulatory Visit: Payer: Commercial Managed Care - PPO | Admitting: Surgical

## 2020-05-02 ENCOUNTER — Encounter: Payer: Self-pay | Admitting: Surgical

## 2020-05-02 ENCOUNTER — Ambulatory Visit (INDEPENDENT_AMBULATORY_CARE_PROVIDER_SITE_OTHER): Payer: Commercial Managed Care - PPO | Admitting: Surgical

## 2020-05-02 ENCOUNTER — Other Ambulatory Visit: Payer: Self-pay

## 2020-05-02 VITALS — BP 140/82 | HR 78 | Temp 98.2°F

## 2020-05-02 DIAGNOSIS — Z923 Personal history of irradiation: Secondary | ICD-10-CM

## 2020-05-02 DIAGNOSIS — Z9011 Acquired absence of right breast and nipple: Secondary | ICD-10-CM

## 2020-05-02 DIAGNOSIS — S21001D Unspecified open wound of right breast, subsequent encounter: Secondary | ICD-10-CM

## 2020-05-02 NOTE — Progress Notes (Signed)
   Subjective:     Patient ID: Rachel Sutton, female    DOB: 05/08/1961, 59 y.o.   MRN: [id number]  Chief Complaint  Patient presents with  . Follow-up    HPI: Patient is a 59 year old female here for follow-up after undergoing excision of right breast wound on 02/17/2020 with Dr. Marla Roe.  She does have a history of radiation to this right breast.  Patient reports that she was doing well with collagen dressing changes until this past weekend when she noticed a large amount of drainage from her right breast.  She was beginning to think that it was closing up.  She is not having any fevers or chills.  She does not have any pain.  No restrictions with range of motion.   Review of Systems  Constitutional: Positive for activity change. Negative for chills and fever.  Skin: Positive for wound.    Objective:   Vital Signs BP 140/82 (BP Location: Left Arm, Patient Position: Sitting, Cuff Size: Normal)   Pulse 78   Temp 98.2 F (36.8 C) (Temporal)   SpO2 97%  Vital Signs and Nursing Note Reviewed Chaperone present Physical Exam  Constitutional: She is oriented to person, place, and time and well-developed, well-nourished, and in no distress.  Pulmonary/Chest: Effort normal.    Neurological: She is alert and oriented to person, place, and time. Gait normal.  Skin: Skin is warm. No rash noted. She is not diaphoretic.  Psychiatric: Mood and affect normal.    Assessment/Plan:     ICD-10-CM   1. Acquired absence of right breast  Z90.11   2. Hx of radiation therapy  Z92.3   3. Breast wound, right, subsequent encounter  S21.001D    Dr. Marla Roe and I were both present during today's evaluation, we discussed options including packing with alginate rope gauze.  We will order patient supplies.  Recommend removing and repacking daily.  Cover with gauze.  In the meantime she can use Vaseline daily over right breast wound.  There is no sign of any infection, seroma,  hematoma.  Follow-up in 2 weeks for reevaluation.  Call with any questions or concerns or symptoms worsen.   Carola Rhine Hulet Ehrmann, PA-C 05/02/2020, 2:07 PM

## 2020-05-03 ENCOUNTER — Telehealth: Payer: Self-pay

## 2020-05-03 NOTE — Telephone Encounter (Signed)
Orders faxed to [facility] for wound/dressing per [place], [place] Copy scanned into chart

## 2020-05-05 ENCOUNTER — Telehealth: Payer: Self-pay | Admitting: Plastic Surgery

## 2020-05-05 NOTE — Telephone Encounter (Signed)
Called [facility] and spoke with Miranda. She advised product went out today and patient should receive her products by tomorrow. Called patient back and forward information that was provided to me. In the meantime she can use Vaseline daily over right breast wound.  Patient understood and agreed.

## 2020-05-05 NOTE — Telephone Encounter (Signed)
Patient said she has not received the products to pack her wounds and she hasn't changed it in two days. What can she use in the meantime? Please call her to advise.

## 2020-05-17 ENCOUNTER — Ambulatory Visit: Payer: Commercial Managed Care - PPO | Admitting: Surgical

## 2020-05-17 NOTE — Progress Notes (Deleted)
Patient is a 59 year old female here for follow-up on her right breast wound.  At her last visit we had ordered silver alginate packing gauze.  Patient has a past medical history of radiation to her right breast.  Smoking status***

## 2020-05-20 ENCOUNTER — Ambulatory Visit (INDEPENDENT_AMBULATORY_CARE_PROVIDER_SITE_OTHER): Payer: Commercial Managed Care - PPO | Admitting: Surgical

## 2020-05-20 ENCOUNTER — Encounter: Payer: Self-pay | Admitting: Surgical

## 2020-05-20 ENCOUNTER — Other Ambulatory Visit: Payer: Self-pay

## 2020-05-20 ENCOUNTER — Telehealth: Payer: Self-pay

## 2020-05-20 VITALS — BP 130/83 | HR 69 | Temp 98.2°F | Wt 170.0 lb

## 2020-05-20 DIAGNOSIS — S21001D Unspecified open wound of right breast, subsequent encounter: Secondary | ICD-10-CM | POA: Diagnosis not present

## 2020-05-20 DIAGNOSIS — Z9011 Acquired absence of right breast and nipple: Secondary | ICD-10-CM

## 2020-05-20 DIAGNOSIS — Z923 Personal history of irradiation: Secondary | ICD-10-CM | POA: Diagnosis not present

## 2020-05-20 NOTE — Telephone Encounter (Signed)
Orders for wound/dressing supplies faxed to [facility] per Matt: Copy scanned into chart as well as a copy of the dressing/changes instructions

## 2020-05-20 NOTE — Progress Notes (Signed)
   Subjective:     Patient ID: Rachel Sutton, female    DOB: 02/27/1961, 59 y.o.   MRN: [id number]  Chief Complaint  Patient presents with  . Follow-up    HPI: The patient is a 59 y.o. female here for follow-up on her right breast wound.  She has been packing it daily with alginate rope gauze.  The wound has increased in size.  She reports that she is not having any pain, fevers, chills, nausea, vomiting.   She is hopeful that we will be able to heal this without surgery, but due to her radiation damage this will be difficult.  Review of Systems  Constitutional: Negative.   Respiratory: Negative.   Cardiovascular: Negative.   Musculoskeletal: Negative.   Skin: Positive for wound. Negative for color change, pallor and rash.     Objective:   Vital Signs BP 130/83 (BP Location: Left Arm, Patient Position: Sitting, Cuff Size: Normal)   Pulse 69   Temp 98.2 F (36.8 C) (Temporal)   Wt 170 lb (77.1 kg)   SpO2 98%   BMI 29.64 kg/m  Vital Signs and Nursing Note Reviewed Chaperone present Physical Exam  Constitutional: She is oriented to person, place, and time and well-developed, well-nourished, and in no distress.  Pulmonary/Chest: Effort normal.    Neurological: She is alert and oriented to person, place, and time.  Skin: Skin is warm and dry. No erythema.  Psychiatric: Mood and affect normal.      Assessment/Plan:     ICD-10-CM   1. Acquired absence of right breast  Z90.11   2. Hx of radiation therapy  Z92.3   3. Breast wound, right, subsequent encounter  S21.001D    Ms. Brubeck has been doing a great job with dressing changes, unfortunately due to her radiation damaged tissue she is having trouble healing her wound.  She had been previously packing it daily with alginate gauze.  We have decided to place donated ACell powder and sheet today and do daily dressing changes with K-Y jelly followed by 4 x 4 gauze.  Cover with Mepilex border dressing.  Recommend changing  Adaptic every other day.  We will order supplies through [facility] for her.  Discussed with patient that if this is unsuccessful, surgical intervention may be needed to clean up the wound edge borders and attempt either primary closure or allow granulation tissue to form with secondary closure.  Patient would like to avoid surgical intervention at this time.  I think it is reasonable to try donated ACell to create a healthy granular bed of tissue and see if she will epithelialize.  Call with any questions or concerns.   Follow-up in 2 weeks for reevaluation.  Pictures were obtained of the patient and placed in the chart with the patient's or guardian's permission.    Carola Rhine Shalika Arntz, PA-C 05/20/2020, 11:02 AM

## 2020-06-02 NOTE — Progress Notes (Deleted)
   Subjective:     Patient ID: Rachel Sutton, female    DOB: 30-Dec-1960, 59 y.o.   MRN: [id number]  No chief complaint on file.   HPI: The patient is a 59 y.o. female here for follow-up on her right breast wound.  She has a history of radiation to her right breast which has complicated her wound healing process.  She was last seen on 05/20/2020 and donated ACell powder and she was applied to right breast wound.  We discussed the role for possible surgical intervention pending poor improvement or healing with recent intervention.  Today***  Review of Systems   Objective:   Vital Signs There were no vitals taken for this visit. Vital Signs and Nursing Note Reviewed Chaperone present*** Physical Exam    Assessment/Plan:  No diagnosis found.  ***   Carola Rhine Brinna Divelbiss, PA-C 06/02/2020, 3:39 PM

## 2020-06-03 ENCOUNTER — Ambulatory Visit: Payer: Commercial Managed Care - PPO | Admitting: Surgical

## 2020-06-22 DIAGNOSIS — R059 Cough, unspecified: Secondary | ICD-10-CM | POA: Insufficient documentation

## 2020-06-23 ENCOUNTER — Ambulatory Visit: Payer: Commercial Managed Care - PPO | Admitting: Surgical

## 2020-06-24 ENCOUNTER — Other Ambulatory Visit (HOSPITAL_COMMUNITY): Payer: Self-pay | Admitting: Psychiatry

## 2020-06-27 ENCOUNTER — Telehealth (HOSPITAL_COMMUNITY): Payer: Commercial Managed Care - PPO | Admitting: Psychiatry

## 2020-06-27 ENCOUNTER — Other Ambulatory Visit: Payer: Self-pay

## 2020-06-27 NOTE — Telephone Encounter (Signed)
Please call, she missed today's appt

## 2020-06-30 ENCOUNTER — Ambulatory Visit: Payer: Commercial Managed Care - PPO | Admitting: Surgical

## 2020-07-01 ENCOUNTER — Other Ambulatory Visit: Payer: Self-pay

## 2020-07-01 ENCOUNTER — Ambulatory Visit (INDEPENDENT_AMBULATORY_CARE_PROVIDER_SITE_OTHER): Payer: Commercial Managed Care - PPO | Admitting: Surgical

## 2020-07-01 ENCOUNTER — Telehealth (INDEPENDENT_AMBULATORY_CARE_PROVIDER_SITE_OTHER): Payer: Commercial Managed Care - PPO | Admitting: Psychiatry

## 2020-07-01 ENCOUNTER — Encounter (HOSPITAL_COMMUNITY): Payer: Self-pay | Admitting: Psychiatry

## 2020-07-01 ENCOUNTER — Encounter: Payer: Self-pay | Admitting: Surgical

## 2020-07-01 VITALS — BP 111/68 | HR 83 | Temp 99.2°F

## 2020-07-01 DIAGNOSIS — S21001D Unspecified open wound of right breast, subsequent encounter: Secondary | ICD-10-CM | POA: Diagnosis not present

## 2020-07-01 DIAGNOSIS — Z923 Personal history of irradiation: Secondary | ICD-10-CM

## 2020-07-01 DIAGNOSIS — Z9011 Acquired absence of right breast and nipple: Secondary | ICD-10-CM

## 2020-07-01 DIAGNOSIS — F322 Major depressive disorder, single episode, severe without psychotic features: Secondary | ICD-10-CM

## 2020-07-01 MED ORDER — ALPRAZOLAM 0.5 MG PO TABS: 0.5000 mg | ORAL_TABLET | Freq: Every day | ORAL | 1 refills | Status: DC

## 2020-07-01 NOTE — Progress Notes (Signed)
   Subjective:     Patient ID: Rachel Sutton, female    DOB: 12-08-1961, 59 y.o.   MRN: [id number]  Chief Complaint  Patient presents with  . Follow-up    HPI: The patient is a 59 y.o. female here for follow-up on her right breast wound.  She was last seen on 05/20/2020 and donated ACell was applied.  She has applied donated ACell few times at home and feels as if she has noticed some improvement in the right breast wound.  She is not interested in any surgical interventions to assist with healing at this time.   Review of Systems  Constitutional: Negative.   Skin: Positive for wound. Negative for color change.     Objective:   Vital Signs BP 111/68 (BP Location: Right Arm, Patient Position: Sitting, Cuff Size: Large)   Pulse 83   Temp 99.2 F (37.3 C) (Oral)   SpO2 98%  Vital Signs and Nursing Note Reviewed Chaperone present Physical Exam Constitutional:      General: She is not in acute distress.    Appearance: Normal appearance. She is not ill-appearing.  Pulmonary:     Effort: Pulmonary effort is normal.  Chest:    Neurological:     General: No focal deficit present.     Mental Status: She is alert and oriented to person, place, and time. Mental status is at baseline.  Psychiatric:        Mood and Affect: Mood normal.        Behavior: Behavior normal.       Assessment/Plan:     ICD-10-CM   1. Acquired absence of right breast  Z90.11   2. Hx of radiation therapy  Z92.3   3. Breast wound, right, subsequent encounter  S21.001D     Discussed with patient that she likely will need some form of surgical intervention to assist with healing this wound, whether this is in the OR or under local anesthesia in the clinic.  This is likely necessary due to the wound edges having rolled borders.  Donated ACell reapplied in the office today, recommend applying K-Y jelly and Adaptic daily.  Do not shower for 3 days.  Provided some additional donated ACell for patient to  apply at home.  Applying the donated ACell every 5 days.  After she runs out of ACell powder, recommend applying silver alginate packing gauze daily.  Recommend calling with questions or concerns. Follow up scheduled for 3 to 4 weeks.  Pictures were obtained of the patient and placed in the chart with the patient's or guardian's permission.   Carola Rhine Emmagene Ortner, PA-C 07/01/2020, 1:21 PM

## 2020-07-01 NOTE — Progress Notes (Signed)
Virtual Visit via Video Note  I connected with Rachel Sutton on 07/01/20 at  9:40 AM EDT by a video enabled telemedicine application and verified that I am speaking with the correct person using two identifiers.   I discussed the limitations of evaluation and management by telemedicine and the availability of in person appointments. The patient expressed understanding and agreed to proceed.    I discussed the assessment and treatment plan with the patient. The patient was provided an opportunity to ask questions and all were answered. The patient agreed with the plan and demonstrated an understanding of the instructions.   The patient was advised to call back or seek an in-person evaluation if the symptoms worsen or if the condition fails to improve as anticipated.  I provided 15 minutes of non-face-to-face time during this encounter. Location: Provider Home, patient home  Levonne Spiller, MD  [facility] MD/PA/NP OP Progress Note  07/01/2020 10:01 AM Rachel Sutton  MRN:  [id number]  Chief Complaint:  Chief Complaint    Anxiety; Follow-up     HPI: This patient is a 59 year old married white female who is currently living in [place].  She works as an Optometrist for [employer]  The patient returns for follow-up after 6 months regarding her depression and anxiety.  The patient states that she is doing well.  She has had to have more surgery on her right breast because it is not healing well after expanders were put in for reconstruction.  The expanders have been removed but she still has a small area that is not healing.  She is seeing plastic surgery.  She states that she is not letting this get in her way and she is doing all of her usual activities.  She is allowed to work from home and she really enjoys this.  In general her mood has been very good and she is enjoyed her marriage with her new husband.  She still uses a little bit of Xanax every day to help with anxiety but she denies  any serious symptoms of depression or panic disorder.  She is sleeping well Visit Diagnosis:    ICD-10-CM   1. Major depressive disorder, single episode, severe without psychotic features ([place])  F32.2     Past Psychiatric History: none  Past Medical History:  Past Medical History:  Diagnosis Date  . Anxiety   . Arthritis   . Breast cancer ([place]) 02/22/2012   Right T1c, N0  . C. difficile colitis   . Colitis, collagenous 09/22/2015  . Depression   . Essential hypertension   . Family history of brain cancer   . Family history of esophageal cancer   . Family history of lung cancer   . Family history of lymphoma   . Family history of pancreatic cancer   . Fuchs' corneal dystrophy   . GERD (gastroesophageal reflux disease)   . Hypertension   . Mitral valve prolapse    Mild mitral regurgitation  . Mixed hyperlipidemia   . Seizures ([place])    Only with dose of haldol; no more since that was stopped.    Past Surgical History:  Procedure Laterality Date  . ABDOMINAL HYSTERECTOMY  1999  . BACK SURGERY    . BIOPSY N/A 09/28/2015   Procedure: BIOPSY;  Surgeon: Danie Binder, MD;  Location: [facility];  Service: Endoscopy;  Laterality: N/A;  . BIOPSY  04/05/2016   Procedure: BIOPSY;  Surgeon: Danie Binder, MD;  [facility];  Service: Endoscopy;;  colon bx  . BREAST LUMPECTOMY  10/30/2002   right  . BREAST RECONSTRUCTION WITH PLACEMENT OF TISSUE EXPANDER AND FLEX HD (ACELLULAR HYDRATED DERMIS) Right 09/07/2019   Procedure: RIGHT BREAST RECONSTRUCTION WITH PLACEMENT OF TISSUE EXPANDER AND FLEX HD (ACELLULAR HYDRATED DERMIS);  Surgeon: Wallace Going, DO;  Location: [place];  Service: Plastics;  Laterality: Right;  . CARDIAC CATHETERIZATION    . Carpal tunnel R/L  1992  . Cervical spine fusion X2 since last visit    . COLONOSCOPY WITH PROPOFOL N/A 09/28/2015   Procedure: COLONOSCOPY WITH PROPOFOL;  Surgeon: Danie Binder, MD;  Location: [facility];  Service: Endoscopy;   Laterality: N/A;  procedure 1 cecum time in 1232  time out 1246   total time 14 mintes  . COLONOSCOPY WITH PROPOFOL N/A 04/05/2016   Procedure: COLONOSCOPY WITH PROPOFOL;  Surgeon: Danie Binder, MD;  [facility];  Service: Endoscopy;  Laterality: N/A;  1200  . 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 DEBRIDEMENT AND CLOSURE WOUND Right 02/17/2020   Procedure: Excision of right breast wound with closure and provena placement;  Surgeon: Wallace Going, DO;  Location: [place];  Service: Plastics;  Laterality: Right;  . ELBOW SURGERY Right  tennis elbow release 2012  . ESOPHAGOGASTRODUODENOSCOPY  10/26/2012   Procedure: ESOPHAGOGASTRODUODENOSCOPY (EGD);  Surgeon: Inda Castle, MD;  Location: [place];  Service: Endoscopy;  Laterality: N/A;  . ESOPHAGOGASTRODUODENOSCOPY (EGD) WITH PROPOFOL N/A 09/28/2015   Procedure: ESOPHAGOGASTRODUODENOSCOPY (EGD) WITH PROPOFOL;  Surgeon: Danie Binder, MD;  Location: [facility];  Service: Endoscopy;  Laterality: N/A;  . EYE SURGERY  2018   corneal transplants bilateral  . KNEE ARTHROSCOPY Left   . LEFT HEART CATHETERIZATION WITH CORONARY ANGIOGRAM N/A 10/23/2012   Procedure: LEFT HEART CATHETERIZATION WITH CORONARY ANGIOGRAM;  Surgeon: Peter M Martinique, MD;  Location: [facility] CATH [facility];  Service: Cardiovascular;  Laterality: N/A;  . MASTECTOMY W/ SENTINEL NODE BIOPSY Right 09/07/2019   Procedure: RIGHT MASTECTOMY WITH SENTINEL LYMPH NODE BIOPSY;  Surgeon: Jovita Kussmaul, MD;  Location: [place];  Service: General;  Laterality: Right;  . OVARIAN CYST REMOVAL     prior to hysterectomy, right  . Ray cage fusion  1997  . REMOVAL OF TISSUE EXPANDER Right 12/31/2019   Procedure: REMOVAL OF TISSUE EXPANDER AND EXCESS SKIN;  Surgeon: Wallace Going, DO;  Location: [place];  Service: Plastics;  Laterality: Right;  1 hour  . SENTINEL LYMPH NODE BIOPSY  10/30/2002   right  . TONSILLECTOMY     at 31 old     Family Psychiatric History: see below  Family History:  Family History  Problem Relation Age of Onset  . Diabetes Mother   . Pancreatic cancer Mother 46  . Lung cancer Father   . Diabetes Father   . Heart disease Father   . Esophageal cancer Father 97       Beer drinker  . Brain cancer Brother 65  . Brain cancer Maternal Grandfather   . Heart disease Paternal Grandfather   . Cancer Maternal Uncle        unknown form of cancer; cigar smoker  . Lung cancer Maternal Grandmother   . Brain cancer Cousin        dx in his late 54s-40s  . Schizophrenia Sister   . Lymphoma Niece 36  . Anesthesia problems Neg Hx   . Hypotension Neg Hx   .  Malignant hyperthermia Neg Hx   . Pseudochol deficiency Neg Hx   . Colon cancer Neg Hx   . Colon polyps Neg Hx   . Crohn's disease Neg Hx   . Ulcerative colitis Neg Hx     Social History:  Social History   Socioeconomic History  . Marital status: Married    Spouse name: Not on file  . Number of children: 2  . Years of education: Not on file  . Highest education level: Not on file  Occupational History  . Occupation: Aeronautical engineer: [employer]    Comment: [employer]  Tobacco Use  . Smoking status: Former Smoker    Packs/day: 0.50    Years: 35.00    Pack years: 17.50    Types: Cigarettes    Start date: 07/07/1974    Quit date: 07/14/2019    Years since quitting: 0.9  . Smokeless tobacco: Never Used  Vaping Use  . Vaping Use: Never used  Substance and Sexual Activity  . Alcohol use: Not Currently    Alcohol/week: 0.0 standard drinks    Comment: stopped July 26, 2014)  . Drug use: No  . Sexual activity: Not Currently    Birth control/protection: Surgical  Other Topics Concern  . Not on file  Social History Narrative  . Not on file   Social Determinants of Health   Financial Resource Strain:   . Difficulty of Paying Living Expenses:   Food Insecurity:   . Worried About Charity fundraiser in the Last Year:   . Arts development officer in the Last Year:   Transportation Needs:   . Film/video editor (Medical):   Marland Kitchen Lack of Transportation (Non-Medical):   Physical Activity:   . Days of Exercise per Week:   . Minutes of Exercise per Session:   Stress:   . Feeling of Stress :   Social Connections:   . Frequency of Communication with Friends and Family:   . Frequency of Social Gatherings with Friends and Family:   . Attends Religious Services:   . Active Member of Clubs or Organizations:   . Attends Archivist Meetings:   Marland Kitchen Marital Status:     Allergies:  Allergies  Allergen Reactions  . Bee Venom Anaphylaxis  . Haloperidol Lactate Other (See Comments)    Convulsions   . Meperidine Hcl Hives  . Xifaxan [Rifaximin] Diarrhea and Other (See Comments)    Constantly felt the need to use the bathroom     Metabolic Disorder Labs: No results found for: HGBA1C, MPG No results found for: PROLACTIN Lab Results  Component Value Date   CHOL 207 (H) 10/23/2012   TRIG 319 (H) 10/23/2012   HDL 33 (L) 10/23/2012   CHOLHDL 6.3 10/23/2012   VLDL 64 (H) 10/23/2012   LDLCALC 110 (H) 10/23/2012   Lab Results  Component Value Date   TSH 0.706 09/22/2015    Therapeutic Level Labs: No results found for: LITHIUM No results found for: VALPROATE No components found for:  CBMZ  Current Medications: Current Outpatient Medications  Medication Sig Dispense Refill  . acetaminophen (TYLENOL) 500 MG tablet Take 1,000 mg by mouth every 6 (six) hours as needed for moderate pain or headache.    . ALPRAZolam (XANAX) 0.5 MG tablet Take 1 tablet (0.5 mg total) by mouth daily. 90 tablet 1  . anastrozole (ARIMIDEX) 1 MG tablet Take 1 tablet (1 mg total) by mouth daily. 90 tablet 3  .  Calcium-Magnesium-Vitamin D (CALCIUM 500 PO) Take 1 tablet by mouth daily.     . diclofenac (VOLTAREN) 75 MG EC tablet Take 75 mg by mouth 2 (two) times daily as needed for mild pain.    Marland Kitchen diclofenac sodium (VOLTAREN) 1 % GEL  Apply 1 application topically 3 (three) times daily as needed (pain).     Marland Kitchen EPINEPHrine (EPI-PEN) 0.3 mg/0.3 mL DEVI Inject 0.3 mg into the muscle as needed (bee stings).     . fish oil-omega-3 fatty acids 1000 MG capsule Take 1 g by mouth every morning.     . hydrochlorothiazide (MICROZIDE) 12.5 MG capsule Take 1 capsule (12.5 mg total) by mouth daily. 90 capsule 3  . hydroxychloroquine (PLAQUENIL) 200 MG tablet Take 400 mg by mouth daily.    Marland Kitchen leflunomide (ARAVA) 20 MG tablet Take 20 mg by mouth daily.    . metoprolol succinate (TOPROL-XL) 50 MG 24 hr tablet TAKE 1 TABLET TWICE A DAY  WITH OR IMMEDIATELY        FOLLOWING A MEAL 180 tablet 2  . Multiple Vitamin (MULITIVITAMIN WITH MINERALS) TABS Take 1 tablet by mouth every morning.    Marland Kitchen NIFEdipine (PROCARDIA XL/NIFEDICAL-XL) 90 MG 24 hr tablet TAKE 1 TABLET DAILY 90 tablet 1  . nitroGLYCERIN (NITROSTAT) 0.4 MG SL tablet Place 1 tablet (0.4 mg total) under the tongue every 5 (five) minutes x 3 doses as needed for chest pain. 25 tablet 3  . pantoprazole (PROTONIX) 40 MG tablet Take 1 tablet (40 mg total) by mouth daily. 90 tablet 3   No current facility-administered medications for this visit.     Musculoskeletal: Strength & Muscle Tone: within normal limits Gait & Station: normal Patient leans: N/A  Psychiatric Specialty Exam: Review of Systems  All other systems reviewed and are negative.   There were no vitals taken for this visit.There is no height or weight on file to calculate BMI.  General Appearance: Casual and Fairly Groomed  Eye Contact:  Good  Speech:  Clear and Coherent  Volume:  Normal  Mood:  Euthymic  Affect:  Appropriate and Congruent  Thought Process:  Goal Directed  Orientation:  Full (Time, Place, and Person)  Thought Content: WDL   Suicidal Thoughts:  No  Homicidal Thoughts:  No  Memory:  Immediate;   Good Recent;   Good Remote;   Good  Judgement:  Good  Insight:  Good  Psychomotor Activity:  Normal   Concentration:  Concentration: Good and Attention Span: Good  Recall:  Good  Fund of Knowledge: Good  Language: Good  Akathisia:  No  Handed:  Right  AIMS (if indicated): not done  Assets:  Communication Skills Desire for Improvement Physical Health Resilience Social Support Talents/Skills Vocational/Educational  ADL's:  Intact  Cognition: WNL  Sleep:  Good   Screenings:   Assessment and Plan: This patient is a 59 year old female who went to a serious depressive episode a few years ago.  However now she is doing very well.  She is only using Xanax 0.5 mg daily for anxiety.  She will continue this dosage and return to see me in 6 months   Levonne Spiller, MD 07/01/2020, 10:01 AM

## 2020-07-22 ENCOUNTER — Other Ambulatory Visit: Payer: Self-pay | Admitting: Cardiology

## 2020-08-02 ENCOUNTER — Ambulatory Visit: Payer: Commercial Managed Care - PPO | Admitting: Plastic Surgery

## 2020-08-02 ENCOUNTER — Encounter: Payer: Self-pay | Admitting: Plastic Surgery

## 2020-08-02 ENCOUNTER — Other Ambulatory Visit: Payer: Self-pay

## 2020-08-02 ENCOUNTER — Ambulatory Visit (INDEPENDENT_AMBULATORY_CARE_PROVIDER_SITE_OTHER): Payer: Commercial Managed Care - PPO | Admitting: Plastic Surgery

## 2020-08-02 VITALS — BP 127/71 | HR 84 | Temp 98.8°F | Ht 64.0 in | Wt 166.0 lb

## 2020-08-02 DIAGNOSIS — S21001A Unspecified open wound of right breast, initial encounter: Secondary | ICD-10-CM

## 2020-08-02 NOTE — Progress Notes (Signed)
   Subjective:    Patient ID: Rachel Sutton, female    DOB: 1961/05/06, 59 y.o.   MRN: [id number]  The patient is a 59 year old white female here for follow-up on her right breast wound.  She had removal of the expander.  She has had a hard time closing the area due to her previous radiation.  It is doing better.  She is pleased with her progress.  She has been placing donated ACell powder on the area.  She is also been using K-Y jelly and keeping it clean.  There is no sign of infection.  Today it looks like it is 2 x 3 x 3 mm in size.  No drainage is noted.     Review of Systems  Constitutional: Negative for activity change.  Eyes: Negative.   Respiratory: Negative.   Cardiovascular: Negative.  Negative for leg swelling.  Gastrointestinal: Negative for abdominal distention.  Endocrine: Negative.   Genitourinary: Negative.   Neurological: Negative.   Hematological: Negative.   Psychiatric/Behavioral: Negative.        Objective:   Physical Exam Vitals and nursing note reviewed.  Constitutional:      Appearance: Normal appearance.  HENT:     Head: Normocephalic and atraumatic.  Cardiovascular:     Rate and Rhythm: Normal rate.     Pulses: Normal pulses.  Pulmonary:     Effort: Pulmonary effort is normal.  Neurological:     General: No focal deficit present.     Mental Status: She is alert. Mental status is at baseline.  Psychiatric:        Mood and Affect: Mood normal.        Behavior: Behavior normal.         Assessment & Plan:     ICD-10-CM   1. Breast wound, right, initial encounter  S21.001A   Donated ACell was applied.  Continue with KY gel daily.  Can shower on Friday.  We will get cellerate sent to her house.  This she should use it daily.  I had like to see her back in 2 to 3 weeks.

## 2020-08-03 ENCOUNTER — Telehealth: Payer: Self-pay | Admitting: *Deleted

## 2020-08-03 NOTE — Telephone Encounter (Signed)
Faxed order on (08/02/20) to [facility] for medical supplies for the patient.  Confirmation received.  Copy scanned into the chart.//AB/CMA

## 2020-08-15 ENCOUNTER — Ambulatory Visit
Admission: RE | Admit: 2020-08-15 | Discharge: 2020-08-15 | Disposition: A | Discharge status: Home / Self Care | Payer: Commercial Managed Care - PPO | Source: Ambulatory Visit | Attending: Hematology | Admitting: Hematology

## 2020-08-15 ENCOUNTER — Other Ambulatory Visit: Payer: Self-pay | Admitting: Hematology

## 2020-08-15 ENCOUNTER — Other Ambulatory Visit: Payer: Self-pay

## 2020-08-15 DIAGNOSIS — Z1231 Encounter for screening mammogram for malignant neoplasm of breast: Secondary | ICD-10-CM

## 2020-08-15 DIAGNOSIS — E2839 Other primary ovarian failure: Secondary | ICD-10-CM

## 2020-08-16 ENCOUNTER — Telehealth: Payer: Self-pay

## 2020-08-16 NOTE — Telephone Encounter (Signed)
-----   Message from Truitt Merle, MD sent at 08/16/2020  9:27 AM EDT ----- Please let pt know her DEXA result, mild osteopenia, no high risk for fracture, continue calcium and VitD supplement, thanks   Truitt Merle  08/16/2020

## 2020-08-16 NOTE — Telephone Encounter (Signed)
Called patient and made her aware of DEXA result and Dr. Ernestina Penna instructions. Patient verbalized understanding.

## 2020-08-17 ENCOUNTER — Ambulatory Visit: Payer: Commercial Managed Care - PPO | Admitting: Surgical

## 2020-08-24 ENCOUNTER — Ambulatory Visit (INDEPENDENT_AMBULATORY_CARE_PROVIDER_SITE_OTHER): Payer: Commercial Managed Care - PPO | Admitting: Surgical

## 2020-08-24 ENCOUNTER — Other Ambulatory Visit: Payer: Self-pay

## 2020-08-24 ENCOUNTER — Encounter: Payer: Self-pay | Admitting: Surgical

## 2020-08-24 VITALS — BP 126/85 | HR 84 | Temp 98.5°F

## 2020-08-24 DIAGNOSIS — Z9011 Acquired absence of right breast and nipple: Secondary | ICD-10-CM

## 2020-08-24 DIAGNOSIS — S21001D Unspecified open wound of right breast, subsequent encounter: Secondary | ICD-10-CM

## 2020-08-24 NOTE — Progress Notes (Addendum)
   Subjective:     Patient ID: Rachel Sutton, female    DOB: 03-26-1961, 59 y.o.   MRN: [id number]  Chief Complaint  Patient presents with  . Follow-up    HPI: The patient is a 59 y.o. female here for follow-up on her right breast wound.  Patient was last seen on 08/02/2020 and donated ACell was applied, patient was ordered collagen powder to use at home.  She reports she has been applying collagen powder once weekly, she is otherwise doing well. The wound today is approximately 2 x 3 x 3 mm without any surrounding erythema or drainage from the wound.  Review of Systems  Constitutional: Negative.      Objective:   Vital Signs BP 126/85 (BP Location: Left Arm, Patient Position: Sitting, Cuff Size: Large)   Pulse 84   Temp 98.5 F (36.9 C) (Oral)   SpO2 97%  Vital Signs and Nursing Note Reviewed Chaperone present Physical Exam Constitutional:      General: She is not in acute distress.    Appearance: Normal appearance. She is not ill-appearing.  Chest:    Skin:    General: Skin is warm and dry.  Neurological:     General: No focal deficit present.     Mental Status: She is alert.  Psychiatric:        Mood and Affect: Mood normal.        Behavior: Behavior normal.       Assessment/Plan:     ICD-10-CM   1. Breast wound, right, subsequent encounter  S21.001D   2. Acquired absence of right breast  Z90.11    Donated ACell applied to the right breast wound, return to using collagen powder in 3 days, start using collagen powder every other day.  Call with questions or concerns. Pictures were obtained of the patient and placed in the chart with the patient's or guardian's permission.  Provided patient with new prescription for second to nature for mastectomy bra  Recommend calling with questions or concerns. Follow up scheduled for 2-3 weeks.   Carola Rhine Faizan Geraci, PA-C 08/24/2020, 11:37 AM

## 2020-09-08 ENCOUNTER — Other Ambulatory Visit: Payer: Self-pay | Admitting: Cardiology

## 2020-09-08 MED ORDER — NITROGLYCERIN 0.4 MG SL SUBL: 0.4000 mg | SUBLINGUAL_TABLET | SUBLINGUAL | 3 refills | Status: AC | PRN

## 2020-09-08 NOTE — Telephone Encounter (Signed)
Refilled NTG per pt request

## 2020-09-08 NOTE — Telephone Encounter (Signed)
NEW MESSAGE    Pt c/o medication issue:  1. Name of Medication:  nitro  2. How are you currently taking this medication (dosage and times per day)? She isnt taking it right now   3. Are you having a reaction (difficulty breathing--STAT)? no  4. What is your medication issue? Her nitro is expired, patient is wanting to know if its still good to use or does she need a new prescription ?

## 2020-09-09 NOTE — Progress Notes (Signed)
Patient is a 59 year old female here for follow-up on her right breast wound.  She had excision and closure of the wound on 02/17/2020 with Dr. Marla Roe.  She has a history of radiation to the right breast and had the expander removed previously on 12/31/2019.  At last visit on 08/24/2020 the wound was approximately 2 x 3 x 3 mm.  Donated ACell was applied to the right breast wound and patient was instructed to return to using collagen powder every other day.  ~ 6.5 mo PO Patient reports she is doing well overall.  Wound appears similar size to last visit.  No signs of infection, redness, seroma/hematoma.  Small amount of clear yellow drainage present on bandage.  Patient denies fever/chills, nausea/vomiting.  Donated ACell was applied to the right breast wound.  She should leave in place for approximately 3 days before showering.  Then return to collagen dressing every other day.  Follow-up in 4 weeks.  Call office with any questions/concerns.  The [place] was signed into law in 2016 which includes the topic of electronic health records.  This provides immediate access to information in MyChart.  This includes consultation notes, operative notes, office notes, lab results and pathology reports.  If you have any questions about what you read please let us know at your next visit or call us at the office.  We are right here with you.

## 2020-09-12 ENCOUNTER — Other Ambulatory Visit: Payer: Self-pay

## 2020-09-12 ENCOUNTER — Encounter: Payer: Self-pay | Admitting: Plastic Surgery

## 2020-09-12 ENCOUNTER — Ambulatory Visit (INDEPENDENT_AMBULATORY_CARE_PROVIDER_SITE_OTHER): Payer: Commercial Managed Care - PPO | Admitting: Plastic Surgery

## 2020-09-12 VITALS — BP 114/70 | HR 111 | Temp 98.9°F

## 2020-09-12 DIAGNOSIS — S21001D Unspecified open wound of right breast, subsequent encounter: Secondary | ICD-10-CM | POA: Diagnosis not present

## 2020-09-12 DIAGNOSIS — Z923 Personal history of irradiation: Secondary | ICD-10-CM

## 2020-09-14 ENCOUNTER — Telehealth: Payer: Self-pay | Admitting: *Deleted

## 2020-09-14 NOTE — Telephone Encounter (Signed)
Received DME Standard Written Order Waynetta Sandy) on (09/12/2020) via of fax from [facility].  Requesting signature and return.  Given to provider to sign.  Orders signed and faxed back to [facility].  Confirmation received and copy scanned into the chart.//AB/CMA

## 2020-10-10 NOTE — Progress Notes (Deleted)
Patient is a 59 year old female here for follow-up on her right breast wound.  She had excision and closure of the wound on 02/17/2020 with Dr. Marla Roe.  She has a history of radiation of the right breast and had the expander removed previously on 12/31/2019.  At last visit on 9/27 donated ACell was applied to the right breast wound.  She was instructed to leave it in place for approximately 3 days before showering and then return to collagen dressing changes every other day.  ~ 7 mo PO

## 2020-10-12 ENCOUNTER — Ambulatory Visit: Payer: Commercial Managed Care - PPO | Admitting: Plastic Surgery

## 2020-10-12 DIAGNOSIS — S21001D Unspecified open wound of right breast, subsequent encounter: Secondary | ICD-10-CM

## 2020-10-12 DIAGNOSIS — Z923 Personal history of irradiation: Secondary | ICD-10-CM

## 2020-10-18 NOTE — Progress Notes (Signed)
Patient is a 59 year old female here for follow-up on her right breast wound.  She had excision and closure of the wound on 02/17/2020 with Dr. Marla Roe.  She has a history of radiation to the right breast and had the expander removed previously on 12/31/2019.  Wound was 2 x 3 x 3 mm.  Donated ACell was applied at last visit on 9/2, after 3 days she was then to return to collagen dressings every other day.  ~ 8 mo PO Patient reports she is doing very well.  Denies fever/chills, nausea/vomiting, pain.  Wound to show good improvement.  No signs of infection or redness.  Mild clear drainage.  Donated ACell was applied today.  Wait 3 days and then shower and return to collagen dressings every other day.  Follow-up in 4 to 6 weeks.  Return precautions provided call office with any questions/concerns.

## 2020-10-19 ENCOUNTER — Ambulatory Visit (INDEPENDENT_AMBULATORY_CARE_PROVIDER_SITE_OTHER): Payer: Commercial Managed Care - PPO | Admitting: Plastic Surgery

## 2020-10-19 ENCOUNTER — Other Ambulatory Visit: Payer: Self-pay

## 2020-10-19 ENCOUNTER — Encounter: Payer: Self-pay | Admitting: Plastic Surgery

## 2020-10-19 VITALS — BP 146/86 | HR 87 | Temp 98.1°F

## 2020-10-19 DIAGNOSIS — Z9011 Acquired absence of right breast and nipple: Secondary | ICD-10-CM

## 2020-10-19 DIAGNOSIS — Z923 Personal history of irradiation: Secondary | ICD-10-CM

## 2020-10-19 DIAGNOSIS — S21001D Unspecified open wound of right breast, subsequent encounter: Secondary | ICD-10-CM | POA: Diagnosis not present

## 2020-10-21 NOTE — Progress Notes (Signed)
[facility]   [phone] Fax:([phone]) [phone]   Clinic Follow up Note   Patient Care Team: Janie Morning, DO as PCP - General (Family Medicine) Satira Sark, MD as PCP - Cardiology (Cardiology) Lendon Colonel, NP as Nurse Practitioner (Nurse Practitioner) Mauro Kaufmann, RN as Oncology Nurse Navigator Rockwell Germany, RN as Oncology Nurse Navigator Truitt Merle, MD as Consulting Physician (Hematology) Jovita Kussmaul, MD as Consulting Physician (General Surgery) Gery Pray, MD as Consulting Physician (Radiation Oncology) Lahoma Rocker, MD as Consulting Physician (Rheumatology) Alla Feeling, NP as Nurse Practitioner (Nurse Practitioner)  Date of Service:  10/26/2020  CHIEF COMPLAINT: F/u of right breast cancer  SUMMARY OF ONCOLOGIC HISTORY: Oncology History  Breast cancer ([place])  10/16/2002 Initial Biopsy   10/16/2002   BREAST, RIGHT, 4 O'CLOCK, NEEDLE BIOPSIES: INVASIVE AND   INTRADUCTAL CARCINOMA.  Results   IMMUNOHISTOCHEMICAL AND MORPHOMETRIC ANALYSIS BY IMAGE CYTOMETRY   (The tumor is immunocytochemically stained for estrogen and   progesterone receptors and Mib-1, and quantitated   morphometrically.)   Estrogen Receptor (Negative, <1%): NEGATIVE   Progesterone Receptor (Negative, <1%): NEGATIVE   Proliferation Marker Ki67 by MIB-1 (Low <30%): 47%  HERCEPTEST   Her 2 neu by immunocytochemical stain (Negative): 3+;   POSITIVE    10/30/2002 Surgery   Surgery 10/30/02 FINAL DIAGNOSIS    1. SENTINEL LYMPH NODE BIOPSY, RIGHT AXILLARY NODE: ONE LYMPH   NODE, NEGATIVE FOR METASTATIC CARCINOMA (0/1), SEE COMMENT.    2. LUMPECTOMY, RIGHT BREAST:   - INVASIVE MAMMARY CARCINOMA, NO SPECIAL TYPE (DUCTAL), 1.3   CM, MSBR GRADE 3 OF 3.   - DUCTAL CARCINOMA IN SITU, HIGH-GRADE.   - EXTENSIVE INTRADUCTAL COMPONENT NEGATIVE (JCRT).   - NO LYMPHOVASCULAR INVASION IDENTIFIED.   - MARGINS FREE OF TUMOR.   - SEE COMMENT AND ONCOLOGY  TABLE.    2003 - 07/2003 Radiation Therapy   Underwent adjuvant radiation to 50.4 gray with an additional 10 gray boost to the surgical cavity completed 07/19/2003     - 02/2004 Chemotherapy   AC for 4 cycles and decetaxel and Herceptin for 4 cycles and then continued herceptin for totla 1 year treatment.      02/22/2012 Initial Diagnosis   Breast cancer ([place])   Malignant neoplasm of upper-outer quadrant of right breast in female, estrogen receptor positive ([place])  07/01/2019 Mammogram   Diagnostic mammogram 07/01/19  IMPRESSION: Suspicious mass in the 9:30 region of the right breast 9 cm from the nipple measuring 1.3 x 1.3 x 1.1 cm. Suspicious mass in the right axilla measuring 5 x 4 x 4 mm.   07/02/2019 Cancer Staging   Staging form: Breast, AJCC 8th Edition - Clinical stage from 07/02/2019: Stage IA (cT1c, cN0, cM0, G2, ER+, PR+, HER2: Equivocal) - Signed by Truitt Merle, MD on 07/07/2019   07/02/2019 Initial Biopsy   Diagnosis 07/02/19 1. Breast, right, needle core biopsy, 9:30 o'clock - INVASIVE DUCTAL CARCINOMA, GRADE 1-2. SEE NOTE - DUCTAL CARCINOMA IN SITU, INTERMEDIATE GRADE 2. Lymph node, needle/core biopsy, right axilla - LYMPH NODE, NEGATIVE FOR CARCINOMA   07/02/2019 Receptors her2   By immunohistochemistry, the tumor cells are EQUIVOCAL for Her2 (2+). HER2 by [place] will be PERFORMED and the RESULTS REPORTED SEPARATELY Estrogen Receptor: 100%, POSITIVE, STRONG STAINING INTENSITY Progesterone Receptor: 90%, POSITIVE, STRONG STAINING INTENSITY Proliferation Marker Ki67: 10%   07/06/2019 Initial Diagnosis   Malignant neoplasm of upper-outer quadrant of right breast in female, estrogen receptor positive ([place])  07/08/2019 -  Anti-estrogen oral therapy   Anastrozole 45m once daily starting 07/08/19 before surgery.    07/20/2019 Genetic Testing   Negative genetic testing on the [facility] Multi-Cancer Panel. The report date is 07/20/2019.  The Multi-Gene Panel offered by [facility] includes  sequencing and/or deletion duplication testing of the following 85 genes: AIP, ALK, APC, ATM, AXIN2,BAP1,  BARD1, BLM, BMPR1A, BRCA1, BRCA2, BRIP1, CASR, CDC73, CDH1, CDK4, CDKN1B, CDKN1C, CDKN2A (p14ARF), CDKN2A (p16INK4a), CEBPA, CHEK2, CTNNA1, DICER1, DIS3L2, EGFR (c.2369C>T, p.Thr790Met variant only), EPCAM (Deletion/duplication testing only), FH, FLCN, GATA2, GPC3, GREM1 (Promoter region deletion/duplication testing only), HOXB13 (c.251G>A, p.Gly84Glu), HRAS, KIT, MAX, MEN1, MET, MITF (c.952G>A, p.Glu318Lys variant only), MLH1, MSH2, MSH3, MSH6, MUTYH, NBN, NF1, NF2, NTHL1, PALB2, PDGFRA, PHOX2B, PMS2, POLD1, POLE, POT1, PRKAR1A, PTCH1, PTEN, RAD50, RAD51C, RAD51D, RB1, RECQL4, RET, RNF43, RUNX1, SDHAF2, SDHA (sequence changes only), SDHB, SDHC, SDHD, SMAD4, SMARCA4, SMARCB1, SMARCE1, STK11, SUFU, TERC, TERT, TMEM127, TP53, TSC1, TSC2, VHL, WRN and WT1.     08/29/2019 Surgery   RIGHT MASTECTOMY WITH SENTINEL LYMPH NODE BIOPSY and RECONSTRUCTION WITH PLACEMENT OF TISSUE EXPANDER AND FLEX HD (ACELLULAR HYDRATED DERMIS) by Dr. TMarlou Starksand Dr DMarla Roe 09/07/19   09/07/2019 Pathology Results   DIAGNOSIS: 09/07/19  A. SENTINEL LYMPH NODE, RIGHT # 1, BIOPSY:  - One lymph node, negative for carcinoma (0/1).   B. SENTINEL LYMPH NODE, RIGHT # 2, BIOPSY:  - One lymph node, negative for carcinoma (0/1).   C. BREAST, RIGHT, MASTECTOMY:  - Invasive ductal carcinoma, 1.8 cm, Nottingham grade 1 of 3.  - Margins of resection are not involved (Closest margin: 20 mm, deep).  - Ductal carcinoma in situ, focal.  - Biopsy site changes.  - See oncology table.    09/07/2019 Oncotype testing   Oncotype RS: 17, meaning there is 5% distant recurrence risk at 9 years with AI/tamoxifen alone. Indicating <1% chemo benefit    10/28/2019 Cancer Staging   Staging form: Breast, AJCC 8th Edition - Pathologic: Stage IA (pT1c, pN0, cM0, G1, ER+, PR+, HER2-, Oncotype DX score: 17) - Signed by FTruitt Merle MD on 10/28/2019    12/31/2019 Pathology Results   FINAL MICROSCOPIC DIAGNOSIS:  A. SKIN, RIGHT BREAST MASTECTOMY FLAP AND SCAR:  - Skin and subcutaneous tissue with fibrosis, mild inflammation and  focal giant cell reaction consistent with scar.  - No evidence of malignancy.    02/24/2020 Survivorship   SCP delivered by LCira Rue NP       CURRENT THERAPY:  Anastrozole 149mdaily starting 10/28/2019  INTERVAL HISTORY:  MaYARELIZ THORSTENSONs here for a follow up of right breast cancer. She was last seen by me 6 months ago. She presents to the clinic alone. She notes she is doing well. She notes tolerating anastrozole but having mood swings. She denies dark thoughts. She notes she is on Xanax which she is on for a long time. She use to be on Paxil. Her RA is stable. She notes left foot burning sensation. She denies injury or surgery to that area. She is on multivitamin and calcium.  She notes her right breast wound is still healing after her surgery. She continues to see her surgeon.    REVIEW OF SYSTEMS:   Constitutional: Denies fevers, chills or abnormal weight loss Eyes: Denies blurriness of vision Ears, nose, mouth, throat, and face: Denies mucositis or sore throat Respiratory: Denies cough, dyspnea or wheezes Cardiovascular: Denies palpitation, chest discomfort or lower extremity swelling Gastrointestinal:  Denies nausea, heartburn or change  in bowel habits Skin: Denies abnormal skin rashes Lymphatics: Denies new lymphadenopathy or easy bruising Neurological:Denies numbness, tingling or new weaknesses Behavioral/Psych: (+) Mood swings  All other systems were reviewed with the patient and are negative.  MEDICAL HISTORY:  Past Medical History:  Diagnosis Date  . Anxiety   . Arthritis   . Breast cancer ([place]) 02/22/2012   Right T1c, N0  . C. difficile colitis   . Colitis, collagenous 09/22/2015  . Depression   . Essential hypertension   . Family history of brain cancer   . Family history of  esophageal cancer   . Family history of lung cancer   . Family history of lymphoma   . Family history of pancreatic cancer   . Fuchs' corneal dystrophy   . GERD (gastroesophageal reflux disease)   . Hypertension   . Mitral valve prolapse    Mild mitral regurgitation  . Mixed hyperlipidemia   . Seizures ([place])    Only with dose of haldol; no more since that was stopped.    SURGICAL HISTORY: Past Surgical History:  Procedure Laterality Date  . ABDOMINAL HYSTERECTOMY  1999  . BACK SURGERY    . BIOPSY N/A 09/28/2015   Procedure: BIOPSY;  Surgeon: Danie Binder, MD;  Location: [facility];  Service: Endoscopy;  Laterality: N/A;  . BIOPSY  04/05/2016   Procedure: BIOPSY;  Surgeon: Danie Binder, MD;  [facility];  Service: Endoscopy;;  colon bx  . BREAST LUMPECTOMY  10/30/2002   right  . BREAST RECONSTRUCTION WITH PLACEMENT OF TISSUE EXPANDER AND FLEX HD (ACELLULAR HYDRATED DERMIS) Right 09/07/2019   Procedure: RIGHT BREAST RECONSTRUCTION WITH PLACEMENT OF TISSUE EXPANDER AND FLEX HD (ACELLULAR HYDRATED DERMIS);  Surgeon: Wallace Going, DO;  Location: [facility];  Service: Plastics;  Laterality: Right;  . CARDIAC CATHETERIZATION    . Carpal tunnel R/L  1992  . Cervical spine fusion X2 since last visit    . COLONOSCOPY WITH PROPOFOL N/A 09/28/2015   Procedure: COLONOSCOPY WITH PROPOFOL;  Surgeon: Danie Binder, MD;  Location: [facility];  Service: Endoscopy;  Laterality: N/A;  procedure 1 cecum time in 1232  time out 1246   total time 14 mintes  . COLONOSCOPY WITH PROPOFOL N/A 04/05/2016   Procedure: COLONOSCOPY WITH PROPOFOL;  Surgeon: Danie Binder, MD;  [facility];  Service: Endoscopy;  Laterality: N/A;  1200  . 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 DEBRIDEMENT AND CLOSURE WOUND Right 02/17/2020   Procedure: Excision of right breast wound with closure and provena placement;  Surgeon: Wallace Going, DO;  Location: [facility];  Service:  Plastics;  Laterality: Right;  . ELBOW SURGERY Right  tennis elbow release 2012  . ESOPHAGOGASTRODUODENOSCOPY  10/26/2012   Procedure: ESOPHAGOGASTRODUODENOSCOPY (EGD);  Surgeon: Inda Castle, MD;  Location: [facility];  Service: Endoscopy;  Laterality: N/A;  . ESOPHAGOGASTRODUODENOSCOPY (EGD) WITH PROPOFOL N/A 09/28/2015   Procedure: ESOPHAGOGASTRODUODENOSCOPY (EGD) WITH PROPOFOL;  Surgeon: Danie Binder, MD;  [facility];  Service: Endoscopy;  Laterality: N/A;  . EYE SURGERY  2018   corneal transplants bilateral  . KNEE ARTHROSCOPY Left   . LEFT HEART CATHETERIZATION WITH CORONARY ANGIOGRAM N/A 10/23/2012   Procedure: LEFT HEART CATHETERIZATION WITH CORONARY ANGIOGRAM;  Surgeon: Peter M Martinique, MD;  Location: [facility] CATH LAB;  Service: Cardiovascular;  Laterality: N/A;  . MASTECTOMY Right   . MASTECTOMY W/ SENTINEL NODE BIOPSY Right 09/07/2019   Procedure:  RIGHT MASTECTOMY WITH SENTINEL LYMPH NODE BIOPSY;  Surgeon: Jovita Kussmaul, MD;  Location: [facility];  Service: General;  Laterality: Right;  . OVARIAN CYST REMOVAL     prior to hysterectomy, right  . Ray cage fusion  1997  . REMOVAL OF TISSUE EXPANDER Right 12/31/2019   Procedure: REMOVAL OF TISSUE EXPANDER AND EXCESS SKIN;  Surgeon: Wallace Going, DO;  Location: [facility];  Service: Plastics;  Laterality: Right;  1 hour  . SENTINEL LYMPH NODE BIOPSY  10/30/2002   right  . TONSILLECTOMY     at 59 months old    I have reviewed the social history and family history with the patient and they are unchanged from previous note.  ALLERGIES:  is allergic to bee venom, haloperidol lactate, meperidine hcl, and xifaxan [rifaximin].  MEDICATIONS:  Current Outpatient Medications  Medication Sig Dispense Refill  . acetaminophen (TYLENOL) 500 MG tablet Take 1,000 mg by mouth every 6 (six) hours as needed for moderate pain or headache.    . ALPRAZolam (XANAX) 0.5 MG tablet Take 1 tablet (0.5 mg total) by mouth daily. 90  tablet 1  . anastrozole (ARIMIDEX) 1 MG tablet Take 1 tablet (1 mg total) by mouth daily. 90 tablet 3  . Calcium-Magnesium-Vitamin D (CALCIUM 500 PO) Take 1 tablet by mouth daily.     . diclofenac (VOLTAREN) 75 MG EC tablet Take 75 mg by mouth 2 (two) times daily as needed for mild pain.    Rachel Sutton diclofenac sodium (VOLTAREN) 1 % GEL Apply 1 application topically 3 (three) times daily as needed (pain).     Rachel Sutton EPINEPHrine (EPI-PEN) 0.3 mg/0.3 mL DEVI Inject 0.3 mg into the muscle as needed (bee stings).     . fish oil-omega-3 fatty acids 1000 MG capsule Take 1 g by mouth every morning.     . hydrochlorothiazide (MICROZIDE) 12.5 MG capsule Take 1 capsule (12.5 mg total) by mouth daily. 90 capsule 3  . hydroxychloroquine (PLAQUENIL) 200 MG tablet Take 400 mg by mouth daily.    Rachel Sutton leflunomide (ARAVA) 20 MG tablet Take 20 mg by mouth daily.    . metoprolol succinate (TOPROL-XL) 50 MG 24 hr tablet TAKE 1 TABLET TWICE A DAY  WITH OR IMMEDIATELY        FOLLOWING A MEAL 180 tablet 2  . Multiple Vitamin (MULITIVITAMIN WITH MINERALS) TABS Take 1 tablet by mouth every morning.    Rachel Sutton NIFEdipine (PROCARDIA XL/NIFEDICAL-XL) 90 MG 24 hr tablet TAKE 1 TABLET DAILY 90 tablet 3  . nitroGLYCERIN (NITROSTAT) 0.4 MG SL tablet Place 1 tablet (0.4 mg total) under the tongue every 5 (five) minutes x 3 doses as needed for chest pain. 25 tablet 3  . pantoprazole (PROTONIX) 40 MG tablet Take 1 tablet (40 mg total) by mouth daily. 90 tablet 3   Current Facility-Administered Medications  Medication Dose Route Frequency Provider Last Rate Last Admin  . influenza vac split quadrivalent PF (FLUARIX) injection 0.5 mL  0.5 mL Intramuscular Once Truitt Merle, MD        PHYSICAL EXAMINATION: ECOG PERFORMANCE STATUS: 0 - Asymptomatic  Vitals:   10/26/20 0936  BP: (!) 114/57  Pulse: 83  Resp: 18  Temp: 97.7 F (36.5 C)  SpO2: 99%   Filed Weights   10/26/20 0936  Weight: 166 lb 14.4 oz (75.7 kg)    GENERAL:alert, no distress and  comfortable SKIN: skin color, texture, turgor are normal, no rashes or significant lesions EYES: normal, Conjunctiva are  pink and non-injected, sclera clear  NECK: supple, thyroid normal size, non-tender, without nodularity LYMPH:  no palpable lymphadenopathy in the cervical, axillary  LUNGS: clear to auscultation and percussion with normal breathing effort HEART: regular rate & rhythm and no murmurs and no lower extremity edema ABDOMEN:abdomen soft, non-tender and normal bowel sounds Musculoskeletal:no cyanosis of digits and no clubbing  NEURO: alert & oriented x 3 with fluent speech, no focal motor/sensory deficits BREAST: S/p right lumpectomy: surgical incision healing well, slowly. Left Breast exam benign.   LABORATORY DATA:  I have reviewed the data as listed CBC Latest Ref Rng & Units 10/26/2020 04/25/2020 09/03/2019  WBC 4.0 - 10.5 K/uL 9.5 7.7 10.1  Hemoglobin 12.0 - 15.0 g/dL 13.8 13.9 14.6  Hematocrit 36 - 46 % 40.4 41.7 42.0  Platelets 150 - 400 K/uL 375 333 375     CMP Latest Ref Rng & Units 10/26/2020 04/25/2020 02/15/2020  Glucose 70 - 99 mg/dL 103(H) 115(H) 145(H)  BUN 6 - 20 mg/dL 14 13 9   Creatinine 0.44 - 1.00 mg/dL 0.82 0.83 0.69  Sodium 135 - 145 mmol/L 139 142 137  Potassium 3.5 - 5.1 mmol/L 4.1 3.9 4.5  Chloride 98 - 111 mmol/L 102 104 99  CO2 22 - 32 mmol/L 25 27 27   Calcium 8.9 - 10.3 mg/dL 9.6 9.5 9.7  Total Protein 6.5 - 8.1 g/dL 7.9 7.8 -  Total Bilirubin 0.3 - 1.2 mg/dL 0.7 0.7 -  Alkaline Phos 38 - 126 U/L 102 134(H) -  AST 15 - 41 U/L 23 31 -  ALT 0 - 44 U/L 14 21 -      RADIOGRAPHIC STUDIES: I have personally reviewed the radiological images as listed and agreed with the findings in the report. No results found.   ASSESSMENT & PLAN:  Rachel Sutton is a 59 y.o. female with   1Malignant neoplasm of upper-outer quadrant of right breast, StageIA,pT1cN0M0, ER+/PP+, HER2-, Grade1 -She was diagnosed in 08/2019. Given prior right lumpectomy and  RT she underwent right mastectomy on 09/07/19. She has a low risk RS of 17 and adjuvant chemotherapy was not recommended. -She has had mastectomy, no adjuvant radiation is needed due to the early stage disease. -She started anastrozole before surgeryon 10/28/19 and has been tolerating well, will continue for 5-7 years.  -She did not proceed with reconstruction surgery she has had tissue expander removed on 12/31/19. Her right breast wound has not healed completely. She continues to f/u with Dr Marla Roe about wound care.  -She is clinically doing well. Lab reviewed, her CBC and CMP are within normal limits except BG 103. Her physical exam and her 07/2020 mammogram were unremarkable. There is no clinical concern for recurrence. -Continue Surveillance. Next mammogram in 07/2021  -Continue Anastrozole  -F/u in 6 months  -She has had COVID booster. Will check if she is up to date on her flu shot.   2. H/o Right breastductalcarcinoma,pT1cN0M0 stage IA,ER-/PR-, HER2 +, Grade 3 -Diagnosed on 10/16/02 managed by Dr. Eston Esters  -Treated with rightlumpectomyand SNLB, Adjuvant chemoAC-Docetaxol/Herceptinand RT  3. Genetictesting was negative for pathogenetic mutations.  4. Osteopenia  -Her 11/2012 DEXA was normal.  -I discussed she has Rheumatoid Arthritis and she is on Anastrozole which can weaken her bone density.  -07/2020 DEXA showed osteopenia with lowest T-score -1.1 at left hip. Continue calcium and Vitd, I encouraged her to exercise   5. Rheumatoid arthritis, diagnosed in 2018.  -She is seen by Rheumatologist.  -She is currently  on low dose weekly methotrexate and Cimzia self-injections q2weeks. She stopped injections after breast cancer diagnosisin 08/2019 -Her rheumatologist Dr. Kathlene November recommended her to Leflunomide, which is fine with [place].  6. H/o Mitral Valve, HTN, GERD, Smoking Cessation -Had cardiac cath in 2013, no stent placed.  -On Metoprolol, nitroglycerin, Protonix   -She used to smokes 2 ppd now smokes 1/2 ppd. She has a 30 year history. She has been counseled on smoking cessation.  7. Anxiety  -She was on Paxil before and currently on Xanax.  -On anastrozole pt notes mood swings, she does not want to restart Paxil for now. Will monitor.    PLAN: -Copy labs to Dr. Kathlene November -Lab and f/u in 6 months  -Continue Anastrozole     No problem-specific Assessment & Plan notes found for this encounter.   No orders of the defined types were placed in this encounter.  All questions were answered. The patient knows to call the clinic with any problems, questions or concerns. No barriers to learning was detected. The total time spent in the appointment was 30 minutes.     Truitt Merle, MD 10/26/2020   I, Joslyn Devon, am acting as scribe for Truitt Merle, MD.   I have reviewed the above documentation for accuracy and completeness, and I agree with the above.

## 2020-10-26 ENCOUNTER — Inpatient Hospital Stay: Payer: Commercial Managed Care - PPO | Attending: Hematology | Admitting: Hematology

## 2020-10-26 ENCOUNTER — Inpatient Hospital Stay: Payer: Commercial Managed Care - PPO

## 2020-10-26 ENCOUNTER — Other Ambulatory Visit: Payer: Self-pay

## 2020-10-26 ENCOUNTER — Encounter: Payer: Self-pay | Admitting: Hematology

## 2020-10-26 VITALS — BP 114/57 | HR 83 | Temp 97.7°F | Resp 18 | Ht 64.0 in | Wt 166.9 lb

## 2020-10-26 DIAGNOSIS — Z87891 Personal history of nicotine dependence: Secondary | ICD-10-CM | POA: Insufficient documentation

## 2020-10-26 DIAGNOSIS — Z170 Estrogen receptor positive status [ER+]: Secondary | ICD-10-CM

## 2020-10-26 DIAGNOSIS — Z79811 Long term (current) use of aromatase inhibitors: Secondary | ICD-10-CM | POA: Diagnosis not present

## 2020-10-26 DIAGNOSIS — M85852 Other specified disorders of bone density and structure, left thigh: Secondary | ICD-10-CM | POA: Insufficient documentation

## 2020-10-26 DIAGNOSIS — C50411 Malignant neoplasm of upper-outer quadrant of right female breast: Secondary | ICD-10-CM

## 2020-10-26 DIAGNOSIS — Z23 Encounter for immunization: Secondary | ICD-10-CM

## 2020-10-26 DIAGNOSIS — F419 Anxiety disorder, unspecified: Secondary | ICD-10-CM | POA: Diagnosis not present

## 2020-10-26 DIAGNOSIS — M069 Rheumatoid arthritis, unspecified: Secondary | ICD-10-CM | POA: Diagnosis not present

## 2020-10-26 LAB — CMP (CANCER CENTER ONLY)
ALT: 14 U/L (ref 0–44)
AST: 23 U/L (ref 15–41)
Albumin: 3.9 g/dL (ref 3.5–5.0)
Alkaline Phosphatase: 102 U/L (ref 38–126)
Anion gap: 12 (ref 5–15)
BUN: 14 mg/dL (ref 6–20)
CO2: 25 mmol/L (ref 22–32)
Calcium: 9.6 mg/dL (ref 8.9–10.3)
Chloride: 102 mmol/L (ref 98–111)
Creatinine: 0.82 mg/dL (ref 0.44–1.00)
GFR, Estimated: >60 mL/min (ref >60)
Glucose, Bld: 103 mg/dL — ABNORMAL HIGH (ref 70–99)
Potassium: 4.1 mmol/L (ref 3.5–5.1)
Sodium: 139 mmol/L (ref 135–145)
Total Bilirubin: 0.7 mg/dL (ref 0.3–1.2)
Total Protein: 7.9 g/dL (ref 6.5–8.1)

## 2020-10-26 LAB — CBC WITH DIFFERENTIAL (CANCER CENTER ONLY)
Abs Immature Granulocytes: 0.02 10*3/uL (ref 0.00–0.07)
Basophils Absolute: 0.1 10*3/uL (ref 0.0–0.1)
Basophils Relative: 1 %
Eosinophils Absolute: 0.4 10*3/uL (ref 0.0–0.5)
Eosinophils Relative: 4 %
HCT: 40.4 % (ref 36.0–46.0)
Hemoglobin: 13.8 g/dL (ref 12.0–15.0)
Immature Granulocytes: 0 %
Lymphocytes Relative: 33 %
Lymphs Abs: 3.1 10*3/uL (ref 0.7–4.0)
MCH: 29.1 pg (ref 26.0–34.0)
MCHC: 34.2 g/dL (ref 30.0–36.0)
MCV: 85.2 fL (ref 80.0–100.0)
Monocytes Absolute: 0.9 10*3/uL (ref 0.1–1.0)
Monocytes Relative: 9 %
Neutro Abs: 5 10*3/uL (ref 1.7–7.7)
Neutrophils Relative %: 53 %
Platelet Count: 375 10*3/uL (ref 150–400)
RBC: 4.74 MIL/uL (ref 3.87–5.11)
RDW: 12.9 % (ref 11.5–15.5)
WBC Count: 9.5 10*3/uL (ref 4.0–10.5)
nRBC: 0 % (ref 0.0–0.2)

## 2020-10-26 MED ORDER — INFLUENZA VAC SPLIT QUAD 0.5 ML IM SUSY: 0.5000 mL | PREFILLED_SYRINGE | Freq: Once | INTRAMUSCULAR | Status: DC

## 2020-10-26 MED ORDER — INFLUENZA VAC SPLIT QUAD 0.5 ML IM SUSY
PREFILLED_SYRINGE | INTRAMUSCULAR | Status: AC
  Filled 2020-10-26: qty 0.5

## 2020-11-18 ENCOUNTER — Other Ambulatory Visit: Payer: Self-pay

## 2020-11-18 ENCOUNTER — Encounter: Payer: Self-pay | Admitting: Surgical

## 2020-11-18 ENCOUNTER — Ambulatory Visit (INDEPENDENT_AMBULATORY_CARE_PROVIDER_SITE_OTHER): Payer: Commercial Managed Care - PPO | Admitting: Surgical

## 2020-11-18 VITALS — BP 132/78 | HR 86 | Temp 97.9°F

## 2020-11-18 DIAGNOSIS — Z923 Personal history of irradiation: Secondary | ICD-10-CM

## 2020-11-18 DIAGNOSIS — S21001D Unspecified open wound of right breast, subsequent encounter: Secondary | ICD-10-CM

## 2020-11-18 NOTE — Progress Notes (Signed)
Patient is a 59 year old female here for follow-up on her right breast wound.  Patient initially had an expander on the right side, however this was removed on 12/31/2019 followed by excision and closure of a right breast wound on 02/17/2020 with Dr. Marla Roe.  She has a history of radiation to this breast.  She was last seen on 10/19/2020.  Patient reports today that she is doing well.  She has been doing collagen dressing changes as instructed.  We have previously discussed scheduling time in the operating room to excise this area and primarily close it, however patient would not like to undergo any anesthesia.  I did discuss with the patient today that I do not feel as if this is going to heal without any surgical intervention whether it is via local anesthesia or general.  Patient is open to local anesthesia in the office.  Chaperone present on exam On exam right breast wound that is approximately 3 x 3 mm without any surrounding erythema.  The breast wound extends down to the chest wall.  The edges of the wound has rolled epithelial edges.  There is no foul odors.  There is no drainage noted.  There is no cellulitic changes.  I do not palpate any fluid collections.  The area is numb per the patient.  Recommend continuing with collagen dressing changes at this time, ideally I would like this patient to undergo excision of this wound and primary closure as the longer it is open the more chance he has of developing an infection.  The wound tracks down to the chest wall.  I discussed with the patient that I do not think this is going to heal by secondary intention/via wound care due to the rolled edges of the epithelium.  I discussed with the patient that ideally she should have this excised and primarily closed in the operating room, however she is not comfortable with that and is willing to do it under local anesthetic.  I took multiple photos today with patient's permission and placed them in her chart to  discuss this further with Dr. Marla Roe to see if this is something we can do under local anesthesia in the office.  There is no sign of infection.  I recommend patient call with any questions or concerns.  We will not schedule a follow-up visit at this time as I will call her next week after I discussed the case with Dr. Marla Roe.  Patient is in agreement with this plan.

## 2020-11-30 ENCOUNTER — Telehealth: Payer: Self-pay | Admitting: Surgical

## 2020-11-30 NOTE — Telephone Encounter (Signed)
Patient said she called [facility] to request more supplies and they said her order expired. Please call her to advise when new order placed with [facility].

## 2020-11-30 NOTE — Telephone Encounter (Signed)
Returned patients call. She is out of 2x2 gauze and the mepilex border. Current order has expired. Okayed by Dr. Marton Redwood send in order. Faxed to [facility].

## 2020-12-05 ENCOUNTER — Other Ambulatory Visit: Payer: Self-pay | Admitting: Cardiology

## 2020-12-21 ENCOUNTER — Other Ambulatory Visit: Payer: Self-pay

## 2020-12-21 ENCOUNTER — Telehealth: Payer: Self-pay | Admitting: Surgical

## 2020-12-21 ENCOUNTER — Ambulatory Visit (INDEPENDENT_AMBULATORY_CARE_PROVIDER_SITE_OTHER): Payer: Commercial Managed Care - PPO | Admitting: Podiatry

## 2020-12-21 DIAGNOSIS — K760 Fatty (change of) liver, not elsewhere classified: Secondary | ICD-10-CM | POA: Insufficient documentation

## 2020-12-21 DIAGNOSIS — L719 Rosacea, unspecified: Secondary | ICD-10-CM | POA: Insufficient documentation

## 2020-12-21 DIAGNOSIS — M0600 Rheumatoid arthritis without rheumatoid factor, unspecified site: Secondary | ICD-10-CM | POA: Insufficient documentation

## 2020-12-21 DIAGNOSIS — G5792 Unspecified mononeuropathy of left lower limb: Secondary | ICD-10-CM

## 2020-12-21 DIAGNOSIS — F419 Anxiety disorder, unspecified: Secondary | ICD-10-CM | POA: Insufficient documentation

## 2020-12-21 DIAGNOSIS — Z923 Personal history of irradiation: Secondary | ICD-10-CM | POA: Insufficient documentation

## 2020-12-21 DIAGNOSIS — E7800 Pure hypercholesterolemia, unspecified: Secondary | ICD-10-CM | POA: Insufficient documentation

## 2020-12-21 DIAGNOSIS — Z853 Personal history of malignant neoplasm of breast: Secondary | ICD-10-CM | POA: Insufficient documentation

## 2020-12-21 DIAGNOSIS — Z6831 Body mass index (BMI) 31.0-31.9, adult: Secondary | ICD-10-CM | POA: Insufficient documentation

## 2020-12-21 DIAGNOSIS — M5416 Radiculopathy, lumbar region: Secondary | ICD-10-CM | POA: Diagnosis not present

## 2020-12-21 MED ORDER — BETAMETHASONE SOD PHOS & ACET 6 (3-3) MG/ML IJ SUSP: 3.0000 mg | Freq: Once | INTRAMUSCULAR | Status: AC

## 2020-12-21 MED ORDER — GABAPENTIN 100 MG PO CAPS: 100.0000 mg | ORAL_CAPSULE | Freq: Three times a day (TID) | ORAL | 1 refills | Status: DC

## 2020-12-21 NOTE — Telephone Encounter (Signed)
Patient called to advise that she received the wrong bandages from [facility]. She got the 4x4 foam bandages that she has to use tape with and she is allergic to the tape. She usually gets the self adhesive ones. Please update order and call patient to advise.

## 2020-12-21 NOTE — Progress Notes (Signed)
   HPI: 60 y.o. female presenting today as a new patient for evaluation of acute sharp shooting sensations to the dorsal aspect of the left foot. Patient states that this has been going on for approximately 4 weeks now. She denies any history of injury or change in activity or shoe gear. Acute onset. Patient does have history of lumbar surgery and constant numbness to the bilateral toes that has been going on for several years. This is an acute on chronic condition. She is not anything for treatment  Past Medical History:  Diagnosis Date  . Anxiety   . Arthritis   . Breast cancer (HCC) 02/22/2012   Right T1c, N0  . C. difficile colitis   . Colitis, collagenous 09/22/2015  . Depression   . Essential hypertension   . Family history of brain cancer   . Family history of esophageal cancer   . Family history of lung cancer   . Family history of lymphoma   . Family history of pancreatic cancer   . Fuchs' corneal dystrophy   . GERD (gastroesophageal reflux disease)   . Hypertension   . Mitral valve prolapse    Mild mitral regurgitation  . Mixed hyperlipidemia   . Seizures (HCC)    Only with dose of haldol; no more since that was stopped.     Physical Exam: General: The patient is alert and oriented x3 in no acute distress.  Dermatology: Skin is warm, dry and supple bilateral lower extremities. Negative for open lesions or macerations.  Vascular: Palpable pedal pulses bilaterally. No edema or erythema noted. Capillary refill within normal limits.  Neurological: Epicritic and protective threshold grossly intact bilaterally. Positive Tinel's sign with percussion of the deep peroneal nerve overlying the dorsum of the left foot with associated tenderness and shooting sensations down the toes.  Musculoskeletal Exam: Range of motion within normal limits to all pedal and ankle joints bilateral. Muscle strength 5/5 in all groups bilateral.   Assessment: 1. Neuritis the peroneal nerve/dorsal of  the left foot   Plan of Care:  1. Patient evaluated.  2. Injection of 0.5 cc Celestone Soluspan injected along the dorsum of the left foot 3. Prescription for gabapentin 100 mg 3 times daily 4. Continue methotrexate as per rheumatologist for chronic rheumatoid arthritis 5. 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 Felecia Shelling, DPM    [address] [place], [place] [address]                Office [phone]  Fax [phone]

## 2020-12-22 NOTE — Telephone Encounter (Signed)
Called [facility] and advised patient should have received mepilex boarder 4x4, not mepilex foam. They are sending out the correct supply today. [facility] is also out of 2x2 gauze and sent 4x4 guaze to the patient.  Called patient back, explained the product situation.

## 2020-12-29 ENCOUNTER — Other Ambulatory Visit (HOSPITAL_COMMUNITY): Payer: Self-pay | Admitting: Psychiatry

## 2021-01-02 ENCOUNTER — Telehealth (INDEPENDENT_AMBULATORY_CARE_PROVIDER_SITE_OTHER): Payer: Commercial Managed Care - PPO | Admitting: Psychiatry

## 2021-01-02 ENCOUNTER — Telehealth (HOSPITAL_COMMUNITY): Payer: Self-pay | Admitting: Psychiatry

## 2021-01-02 ENCOUNTER — Encounter (HOSPITAL_COMMUNITY): Payer: Self-pay | Admitting: Psychiatry

## 2021-01-02 ENCOUNTER — Other Ambulatory Visit: Payer: Self-pay

## 2021-01-02 DIAGNOSIS — F322 Major depressive disorder, single episode, severe without psychotic features: Secondary | ICD-10-CM

## 2021-01-02 MED ORDER — ALPRAZOLAM 0.5 MG PO TABS: 0.5000 mg | ORAL_TABLET | Freq: Every day | ORAL | 1 refills | Status: DC

## 2021-01-02 NOTE — Progress Notes (Signed)
Virtual Visit via Telephone Note  I connected with Rachel Sutton on 01/02/21 at  9:00 AM EST by telephone and verified that I am speaking with the correct person using two identifiers.  Location: Patient:home Provider: home   I discussed the limitations, risks, security and privacy concerns of performing an evaluation and management service by telephone and the availability of in person appointments. I also discussed with the patient that there may be a patient responsible charge related to this service. The patient expressed understanding and agreed to proceed.     I discussed the assessment and treatment plan with the patient. The patient was provided an opportunity to ask questions and all were answered. The patient agreed with the plan and demonstrated an understanding of the instructions.   The patient was advised to call back or seek an in-person evaluation if the symptoms worsen or if the condition fails to improve as anticipated.  I provided 15 minutes of non-face-to-face time during this encounter.   Levonne Spiller, MD  [facility] MD/PA/NP OP Progress Note  01/02/2021 9:13 AM Rachel Sutton  MRN:  [id number]  Chief Complaint: anxiety HPI: This patient is a 60 year old married white female who is currently living in [place].  She works as an Optometrist for [employer]  The patient returns for follow-up after 6 months regarding her depression and anxiety  The patient states she is doing fairly well.  She is getting tired of working from home during the coronavirus pandemic but she is handling it okay.  She states that one of the medications she takes to reduce the hormones involved in breast cancer makes her feel somewhat irritable.  However she denies being depressed or suicidal and does not feel like she wants to go back on an antidepressant.  She takes 0.5 mg Xanax every morning and it seems to help her function better and be less stressed.  She is sleeping well.  She has  her husband get out and work around their property or do pressure washing for people so they are staying busy. Visit Diagnosis:    ICD-10-CM   1. Major depressive disorder, single episode, severe without psychotic features ([place])  F32.2     Past Psychiatric History: none  Past Medical History:  Past Medical History:  Diagnosis Date  . Anxiety   . Arthritis   . Breast cancer ([place]) 02/22/2012   Right T1c, N0  . C. difficile colitis   . Colitis, collagenous 09/22/2015  . Depression   . Essential hypertension   . Family history of brain cancer   . Family history of esophageal cancer   . Family history of lung cancer   . Family history of lymphoma   . Family history of pancreatic cancer   . Fuchs' corneal dystrophy   . GERD (gastroesophageal reflux disease)   . Hypertension   . Mitral valve prolapse    Mild mitral regurgitation  . Mixed hyperlipidemia   . Seizures ([place])    Only with dose of haldol; no more since that was stopped.    Past Surgical History:  Procedure Laterality Date  . ABDOMINAL HYSTERECTOMY  1999  . BACK SURGERY    . BIOPSY N/A 09/28/2015   Procedure: BIOPSY;  Surgeon: Danie Binder, MD;  Location: [facility];  Service: Endoscopy;  Laterality: N/A;  . BIOPSY  04/05/2016   Procedure: BIOPSY;  Surgeon: Danie Binder, MD;  [facility];  Service: Endoscopy;;  colon bx  . BREAST  LUMPECTOMY  10/30/2002   right  . BREAST RECONSTRUCTION WITH PLACEMENT OF TISSUE EXPANDER AND FLEX HD (ACELLULAR HYDRATED DERMIS) Right 09/07/2019   Procedure: RIGHT BREAST RECONSTRUCTION WITH PLACEMENT OF TISSUE EXPANDER AND FLEX HD (ACELLULAR HYDRATED DERMIS);  Surgeon: Wallace Going, DO;  Location: [facility];  Service: Plastics;  Laterality: Right;  . CARDIAC CATHETERIZATION    . Carpal tunnel R/L  1992  . Cervical spine fusion X2 since last visit    . COLONOSCOPY WITH PROPOFOL N/A 09/28/2015   Procedure: COLONOSCOPY WITH PROPOFOL;  Surgeon: Danie Binder, MD;  Location: [facility];  Service: Endoscopy;  Laterality: N/A;  procedure 1 cecum time in 1232  time out 1246   total time 14 mintes  . COLONOSCOPY WITH PROPOFOL N/A 04/05/2016   Procedure: COLONOSCOPY WITH PROPOFOL;  Surgeon: Danie Binder, MD;  [facility];  Service: Endoscopy;  Laterality: N/A;  1200  . 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 DEBRIDEMENT AND CLOSURE WOUND Right 02/17/2020   Procedure: Excision of right breast wound with closure and provena placement;  Surgeon: Wallace Going, DO;  Location: [place];  Service: Plastics;  Laterality: Right;  . ELBOW SURGERY Right  tennis elbow release 2012  . ESOPHAGOGASTRODUODENOSCOPY  10/26/2012   Procedure: ESOPHAGOGASTRODUODENOSCOPY (EGD);  Surgeon: Inda Castle, MD;  Location: [place];  Service: Endoscopy;  Laterality: N/A;  . ESOPHAGOGASTRODUODENOSCOPY (EGD) WITH PROPOFOL N/A 09/28/2015   Procedure: ESOPHAGOGASTRODUODENOSCOPY (EGD) WITH PROPOFOL;  Surgeon: Danie Binder, MD;  [facility];  Service: Endoscopy;  Laterality: N/A;  . EYE SURGERY  2018   corneal transplants bilateral  . KNEE ARTHROSCOPY Left   . LEFT HEART CATHETERIZATION WITH CORONARY ANGIOGRAM N/A 10/23/2012   Procedure: LEFT HEART CATHETERIZATION WITH CORONARY ANGIOGRAM;  Surgeon: Peter M Martinique, MD;  Location: [facility] CATH LAB;  Service: Cardiovascular;  Laterality: N/A;  . MASTECTOMY Right   . MASTECTOMY W/ SENTINEL NODE BIOPSY Right 09/07/2019   Procedure: RIGHT MASTECTOMY WITH SENTINEL LYMPH NODE BIOPSY;  Surgeon: Jovita Kussmaul, MD;  Location: [place];  Service: General;  Laterality: Right;  . OVARIAN CYST REMOVAL     prior to hysterectomy, right  . Ray cage fusion  1997  . REMOVAL OF TISSUE EXPANDER Right 12/31/2019   Procedure: REMOVAL OF TISSUE EXPANDER AND EXCESS SKIN;  Surgeon: Wallace Going, DO;  Location: [place];  Service: Plastics;  Laterality: Right;  1 hour  . SENTINEL LYMPH NODE BIOPSY  10/30/2002   right   . TONSILLECTOMY     at 35 months old    Family Psychiatric History: none  Family History:  Family History  Problem Relation Age of Onset  . Diabetes Mother   . Pancreatic cancer Mother 93  . Lung cancer Father   . Diabetes Father   . Heart disease Father   . Esophageal cancer Father 58       Beer drinker  . Brain cancer Brother 19  . Brain cancer Maternal Grandfather   . Heart disease Paternal Grandfather   . Cancer Maternal Uncle        unknown form of cancer; cigar smoker  . Lung cancer Maternal Grandmother   . Brain cancer Cousin        dx in his late 76s-40s  . Schizophrenia Sister   . Lymphoma Niece 71  . Anesthesia problems Neg Hx   . Hypotension Neg Hx   . Malignant hyperthermia  Neg Hx   . Pseudochol deficiency Neg Hx   . Colon cancer Neg Hx   . Colon polyps Neg Hx   . Crohn's disease Neg Hx   . Ulcerative colitis Neg Hx     Social History:  Social History   Socioeconomic History  . Marital status: Married    Spouse name: Not on file  . Number of children: 2  . Years of education: Not on file  . Highest education level: Not on file  Occupational History  . Occupation: Aeronautical engineer: [employer]    Comment: [employer]  Tobacco Use  . Smoking status: Former Smoker    Packs/day: 0.50    Years: 35.00    Pack years: 17.50    Types: Cigarettes    Start date: 07/07/1974    Quit date: 07/14/2019    Years since quitting: 1.4  . Smokeless tobacco: Never Used  Vaping Use  . Vaping Use: Never used  Substance and Sexual Activity  . Alcohol use: Not Currently    Alcohol/week: 0.0 standard drinks    Comment: stopped July 26, 2014)  . Drug use: No  . Sexual activity: Not Currently    Birth control/protection: Surgical  Other Topics Concern  . Not on file  Social History Narrative  . Not on file   Social Determinants of Health   Financial Resource Strain: Not on file  Food Insecurity: Not on file  Transportation Needs: Not on file  Physical  Activity: Not on file  Stress: Not on file  Social Connections: Not on file    Allergies:  Allergies  Allergen Reactions  . Bee Venom Anaphylaxis  . Haloperidol Lactate Other (See Comments)    Convulsions   . Meperidine Hcl Hives  . Xifaxan [Rifaximin] Diarrhea and Other (See Comments)    Constantly felt the need to use the bathroom     Metabolic Disorder Labs: No results found for: HGBA1C, MPG No results found for: PROLACTIN Lab Results  Component Value Date   CHOL 207 (H) 10/23/2012   TRIG 319 (H) 10/23/2012   HDL 33 (L) 10/23/2012   CHOLHDL 6.3 10/23/2012   VLDL 64 (H) 10/23/2012   LDLCALC 110 (H) 10/23/2012   Lab Results  Component Value Date   TSH 0.706 09/22/2015    Therapeutic Level Labs: No results found for: LITHIUM No results found for: VALPROATE No components found for:  CBMZ  Current Medications: Current Outpatient Medications  Medication Sig Dispense Refill  . acetaminophen (TYLENOL) 500 MG tablet Take 1,000 mg by mouth every 6 (six) hours as needed for moderate pain or headache.    . ALPRAZolam (XANAX) 0.5 MG tablet Take 1 tablet (0.5 mg total) by mouth daily. 90 tablet 1  . anastrozole (ARIMIDEX) 1 MG tablet Take 1 tablet (1 mg total) by mouth daily. 90 tablet 3  . Calcium-Magnesium-Vitamin D (CALCIUM 500 PO) Take 1 tablet by mouth daily.     . Certolizumab Pegol (CIMZIA) 2 X 200 MG KIT See admin instructions.    . cyclobenzaprine (FLEXERIL) 10 MG tablet 1/2 - 1 tablet  as needed    . diazepam (VALIUM) 2 MG tablet 1 tablet as needed    . diclofenac (VOLTAREN) 75 MG EC tablet Take 75 mg by mouth 2 (two) times daily as needed for mild pain.    Marland Kitchen diclofenac sodium (VOLTAREN) 1 % GEL Apply 1 application topically 3 (three) times daily as needed (pain).     Marland Kitchen EPINEPHrine (EPI-PEN)  0.3 mg/0.3 mL DEVI Inject 0.3 mg into the muscle as needed (bee stings).     . fish oil-omega-3 fatty acids 1000 MG capsule Take 1 g by mouth every morning.     . folic acid  (FOLVITE) 1 MG tablet 1 tablet    . gabapentin (NEURONTIN) 100 MG capsule Take 1 capsule (100 mg total) by mouth 3 (three) times daily. 90 capsule 1  . hydrochlorothiazide (MICROZIDE) 12.5 MG capsule Take 1 capsule (12.5 mg total) by mouth daily. 90 capsule 3  . hydroxychloroquine (PLAQUENIL) 200 MG tablet Take 400 mg by mouth daily.    Marland Kitchen leflunomide (ARAVA) 20 MG tablet Take 20 mg by mouth daily.    . metoprolol succinate (TOPROL-XL) 50 MG 24 hr tablet TAKE 1 TABLET TWICE A DAY  WITH OR IMMEDIATELY        FOLLOWING A MEAL 180 tablet 2  . Multiple Vitamin (MULITIVITAMIN WITH MINERALS) TABS Take 1 tablet by mouth every morning.    Marland Kitchen NIFEdipine (PROCARDIA XL/NIFEDICAL-XL) 90 MG 24 hr tablet TAKE 1 TABLET DAILY 90 tablet 3  . nitroGLYCERIN (NITROSTAT) 0.4 MG SL tablet Place 1 tablet (0.4 mg total) under the tongue every 5 (five) minutes x 3 doses as needed for chest pain. 25 tablet 3  . pantoprazole (PROTONIX) 40 MG tablet Take 1 tablet (40 mg total) by mouth daily. 90 tablet 3   Current Facility-Administered Medications  Medication Dose Route Frequency Provider Last Rate Last Admin  . betamethasone acetate-betamethasone sodium phosphate (CELESTONE) injection 3 mg  3 mg Intramuscular Once Edrick Kins, DPM         Musculoskeletal: Strength & Muscle Tone: within normal limits Gait & Station: normal Patient leans: N/A  Psychiatric Specialty Exam: Review of Systems  Musculoskeletal: Positive for arthralgias.  All other systems reviewed and are negative.   There were no vitals taken for this visit.There is no height or weight on file to calculate BMI.  General Appearance: NA  Eye Contact:  NA  Speech:  Clear and Coherent  Volume:  Normal  Mood:  Euthymic  Affect:  NA  Thought Process:  Goal Directed  Orientation:  Full (Time, Place, and Person)  Thought Content: WDL   Suicidal Thoughts:  No  Homicidal Thoughts:  No  Memory:  Immediate;   Good Recent;   Good Remote;   Fair   Judgement:  Good  Insight:  Good  Psychomotor Activity:  Normal  Concentration:  Concentration: Good and Attention Span: Good  Recall:  Good  Fund of Knowledge: Good  Language: Good  Akathisia:  No  Handed:  Right  AIMS (if indicated): not done  Assets:  Communication Skills Desire for Improvement Resilience Social Support Talents/Skills  ADL's:  Intact  Cognition: WNL  Sleep:  Good   Screenings:   Assessment and Plan: This patient is a 60 year old female who went through a serious depressive episode a several years ago.  However now she is doing well.  She will continue Xanax 0.5 mg daily for anxiety.  She will return to see me in 6 months or call as needed   Levonne Spiller, MD 01/02/2021, 9:13 AM

## 2021-01-02 NOTE — Telephone Encounter (Signed)
Called to schedule 25m f/u pt advised she is resting and will call back tomorrow

## 2021-01-03 ENCOUNTER — Other Ambulatory Visit: Payer: Self-pay | Admitting: Cardiology

## 2021-01-25 ENCOUNTER — Ambulatory Visit: Payer: Commercial Managed Care - PPO | Admitting: Podiatry

## 2021-02-06 ENCOUNTER — Other Ambulatory Visit: Payer: Self-pay

## 2021-02-06 ENCOUNTER — Telehealth: Payer: Self-pay | Admitting: Cardiology

## 2021-02-06 NOTE — Telephone Encounter (Signed)
Yes, go ahead and arrange a follow-up echocardiogram for mitral valve prolapse prior to her upcoming visit.

## 2021-02-06 NOTE — Telephone Encounter (Signed)
There is no mention of f/u Echo from her last OV with Dr. Domenic Polite. Will forward to provider to see if we need to add order.

## 2021-02-06 NOTE — Telephone Encounter (Signed)
Pt called to make yearly f/u apt- she mentioned that he wanted her to have Echo prior to her next apt-   Please add order

## 2021-02-07 ENCOUNTER — Other Ambulatory Visit: Payer: Self-pay

## 2021-02-07 DIAGNOSIS — I341 Nonrheumatic mitral (valve) prolapse: Secondary | ICD-10-CM

## 2021-02-07 NOTE — Progress Notes (Signed)
Orders for Echo

## 2021-02-07 NOTE — Telephone Encounter (Signed)
Orders placed for Echocardiogram due to mitral valve prolapse per Dr. Domenic Polite.

## 2021-02-26 ENCOUNTER — Other Ambulatory Visit: Payer: Self-pay | Admitting: Cardiology

## 2021-04-21 NOTE — Progress Notes (Signed)
[place]   [phone] [phone]   Clinic Follow up Note   Patient Care Team: Janie Morning, DO as PCP - General (Family Medicine) Satira Sark, MD as PCP - Cardiology (Cardiology) Lendon Colonel, NP as Nurse Practitioner (Nurse Practitioner) Mauro Kaufmann, RN as Oncology Nurse Navigator Rockwell Germany, RN as Oncology Nurse Navigator Truitt Merle, MD as Consulting Physician (Hematology) Jovita Kussmaul, MD as Consulting Physician (General Surgery) Gery Pray, MD as Consulting Physician (Radiation Oncology) Lahoma Rocker, MD as Consulting Physician (Rheumatology) Alla Feeling, NP as Nurse Practitioner (Nurse Practitioner)  Date of Service:  04/26/2021  CHIEF COMPLAINT: F/u of right breast cancer  SUMMARY OF ONCOLOGIC HISTORY: Oncology History  Breast cancer ([place])  10/16/2002 Initial Biopsy   10/16/2002   BREAST, RIGHT, 4 O'CLOCK, NEEDLE BIOPSIES: INVASIVE AND   INTRADUCTAL CARCINOMA.  Results   IMMUNOHISTOCHEMICAL AND MORPHOMETRIC ANALYSIS BY IMAGE CYTOMETRY   (The tumor is immunocytochemically stained for estrogen and   progesterone receptors and Mib-1, and quantitated   morphometrically.)   Estrogen Receptor (Negative, <1%): NEGATIVE   Progesterone Receptor (Negative, <1%): NEGATIVE   Proliferation Marker Ki67 by MIB-1 (Low <30%): 47%  HERCEPTEST   Her 2 neu by immunocytochemical stain (Negative): 3+;   POSITIVE    10/30/2002 Surgery   Surgery 10/30/02 FINAL DIAGNOSIS    1. SENTINEL LYMPH NODE BIOPSY, RIGHT AXILLARY NODE: ONE LYMPH   NODE, NEGATIVE FOR METASTATIC CARCINOMA (0/1), SEE COMMENT.    2. LUMPECTOMY, RIGHT BREAST:   - INVASIVE MAMMARY CARCINOMA, NO SPECIAL TYPE (DUCTAL), 1.3   CM, MSBR GRADE 3 OF 3.   - DUCTAL CARCINOMA IN SITU, HIGH-GRADE.   - EXTENSIVE INTRADUCTAL COMPONENT NEGATIVE (JCRT).   - NO LYMPHOVASCULAR INVASION IDENTIFIED.   - MARGINS FREE OF TUMOR.   - SEE COMMENT AND ONCOLOGY TABLE.     2003 - 07/2003 Radiation Therapy   Underwent adjuvant radiation to 50.4 gray with an additional 10 gray boost to the surgical cavity completed 07/19/2003     - 02/2004 Chemotherapy   AC for 4 cycles and decetaxel and Herceptin for 4 cycles and then continued herceptin for totla 1 year treatment.      02/22/2012 Initial Diagnosis   Breast cancer ([place])   Malignant neoplasm of upper-outer quadrant of right breast in female, estrogen receptor positive ([place])  07/01/2019 Mammogram   Diagnostic mammogram 07/01/19  IMPRESSION: Suspicious mass in the 9:30 region of the right breast 9 cm from the nipple measuring 1.3 x 1.3 x 1.1 cm. Suspicious mass in the right axilla measuring 5 x 4 x 4 mm.   07/02/2019 Cancer Staging   Staging form: Breast, AJCC 8th Edition - Clinical stage from 07/02/2019: Stage IA (cT1c, cN0, cM0, G2, ER+, PR+, HER2: Equivocal) - Signed by Truitt Merle, MD on 07/07/2019   07/02/2019 Initial Biopsy   Diagnosis 07/02/19 1. Breast, right, needle core biopsy, 9:30 o'clock - INVASIVE DUCTAL CARCINOMA, GRADE 1-2. SEE NOTE - DUCTAL CARCINOMA IN SITU, INTERMEDIATE GRADE 2. Lymph node, needle/core biopsy, right axilla - LYMPH NODE, NEGATIVE FOR CARCINOMA   07/02/2019 Receptors her2   By immunohistochemistry, the tumor cells are EQUIVOCAL for Her2 (2+). HER2 by [place] will be PERFORMED and the RESULTS REPORTED SEPARATELY Estrogen Receptor: 100%, POSITIVE, STRONG STAINING INTENSITY Progesterone Receptor: 90%, POSITIVE, STRONG STAINING INTENSITY Proliferation Marker Ki67: 10%   07/06/2019 Initial Diagnosis   Malignant neoplasm of upper-outer quadrant of right breast in female, estrogen receptor positive ([place])  07/08/2019 -  Anti-estrogen oral therapy   Anastrozole 107m once daily starting 07/08/19 before surgery.    07/20/2019 Genetic Testing   Negative genetic testing on the [facility] Multi-Cancer Panel. The report date is 07/20/2019.  The Multi-Gene Panel offered by [facility] includes  sequencing and/or deletion duplication testing of the following 85 genes: AIP, ALK, APC, ATM, AXIN2,BAP1,  BARD1, BLM, BMPR1A, BRCA1, BRCA2, BRIP1, CASR, CDC73, CDH1, CDK4, CDKN1B, CDKN1C, CDKN2A (p14ARF), CDKN2A (p16INK4a), CEBPA, CHEK2, CTNNA1, DICER1, DIS3L2, EGFR (c.2369C>T, p.Thr790Met variant only), EPCAM (Deletion/duplication testing only), FH, FLCN, GATA2, GPC3, GREM1 (Promoter region deletion/duplication testing only), HOXB13 (c.251G>A, p.Gly84Glu), HRAS, KIT, MAX, MEN1, MET, MITF (c.952G>A, p.Glu318Lys variant only), MLH1, MSH2, MSH3, MSH6, MUTYH, NBN, NF1, NF2, NTHL1, PALB2, PDGFRA, PHOX2B, PMS2, POLD1, POLE, POT1, PRKAR1A, PTCH1, PTEN, RAD50, RAD51C, RAD51D, RB1, RECQL4, RET, RNF43, RUNX1, SDHAF2, SDHA (sequence changes only), SDHB, SDHC, SDHD, SMAD4, SMARCA4, SMARCB1, SMARCE1, STK11, SUFU, TERC, TERT, TMEM127, TP53, TSC1, TSC2, VHL, WRN and WT1.     08/29/2019 Surgery   RIGHT MASTECTOMY WITH SENTINEL LYMPH NODE BIOPSY and RECONSTRUCTION WITH PLACEMENT OF TISSUE EXPANDER AND FLEX HD (ACELLULAR HYDRATED DERMIS) by Dr. TMarlou Starksand Dr DMarla Roe 09/07/19   09/07/2019 Pathology Results   DIAGNOSIS: 09/07/19  A. SENTINEL LYMPH NODE, RIGHT # 1, BIOPSY:  - One lymph node, negative for carcinoma (0/1).   B. SENTINEL LYMPH NODE, RIGHT # 2, BIOPSY:  - One lymph node, negative for carcinoma (0/1).   C. BREAST, RIGHT, MASTECTOMY:  - Invasive ductal carcinoma, 1.8 cm, Nottingham grade 1 of 3.  - Margins of resection are not involved (Closest margin: 20 mm, deep).  - Ductal carcinoma in situ, focal.  - Biopsy site changes.  - See oncology table.    09/07/2019 Oncotype testing   Oncotype RS: 17, meaning there is 5% distant recurrence risk at 9 years with AI/tamoxifen alone. Indicating <1% chemo benefit    10/28/2019 Cancer Staging   Staging form: Breast, AJCC 8th Edition - Pathologic: Stage IA (pT1c, pN0, cM0, G1, ER+, PR+, HER2-, Oncotype DX score: 17) - Signed by FTruitt Merle MD on 10/28/2019    12/31/2019 Pathology Results   FINAL MICROSCOPIC DIAGNOSIS:  A. SKIN, RIGHT BREAST MASTECTOMY FLAP AND SCAR:  - Skin and subcutaneous tissue with fibrosis, mild inflammation and  focal giant cell reaction consistent with scar.  - No evidence of malignancy.    02/24/2020 Survivorship   SCP delivered by LCira Rue NP       CURRENT THERAPY:  Anastrozole 1427mdaily starting 10/28/2019  INTERVAL HISTORY:  MaBRIEANNA NAUs here for a follow up of right breast cancer. She was last seen by me 6 months ago. She presents to the clinic alone. She notes she is doing well. I reviewed her medication list with her. She is no longer taking flexeril, valium, Volteran tablets, Folic acid. She continues anastrozole and tolerating well. She has Rheumatoid arthritis which is still at baseline. She denies hot flashes. She does note having mood swings. She notes she has depression and irritability from this. She notes she keeps her depression to herself. She took Paxil in the past. She currently only takes Xanax 0.27m3mhich controls her anxiety. She notes occasional swelling of her left ankle. She notes she has gained weight on anastrozole. She is not as active as she has been working from home since the start of pandemic.     REVIEW OF SYSTEMS:   Constitutional: Denies fevers, chills or abnormal weight loss Eyes: Denies blurriness  of vision Ears, nose, mouth, throat, and face: Denies mucositis or sore throat Respiratory: Denies cough, dyspnea or wheezes Cardiovascular: Denies palpitation, chest discomfort or lower extremity swelling Gastrointestinal:  Denies nausea, heartburn or change in bowel habits Skin: Denies abnormal skin rashes Lymphatics: Denies new lymphadenopathy or easy bruising Neurological:Denies numbness, tingling or new weaknesses Behavioral/Psych: (+) Mood swings with depression and irritability All other systems were reviewed with the patient and are negative.  MEDICAL HISTORY:   Past Medical History:  Diagnosis Date  . Anxiety   . Arthritis   . Breast cancer ([place]) 02/22/2012   Right T1c, N0  . C. difficile colitis   . Colitis, collagenous 09/22/2015  . Depression   . Essential hypertension   . Family history of brain cancer   . Family history of esophageal cancer   . Family history of lung cancer   . Family history of lymphoma   . Family history of pancreatic cancer   . Fuchs' corneal dystrophy   . GERD (gastroesophageal reflux disease)   . Hypertension   . Mitral valve prolapse    Mild mitral regurgitation  . Mixed hyperlipidemia   . Seizures ([place])    Only with dose of haldol; no more since that was stopped.    SURGICAL HISTORY: Past Surgical History:  Procedure Laterality Date  . ABDOMINAL HYSTERECTOMY  1999  . BACK SURGERY    . BIOPSY N/A 09/28/2015   Procedure: BIOPSY;  Surgeon: Danie Binder, MD;  Location: [facility];  Service: Endoscopy;  Laterality: N/A;  . BIOPSY  04/05/2016   Procedure: BIOPSY;  Surgeon: Danie Binder, MD;  [facility];  Service: Endoscopy;;  colon bx  . BREAST LUMPECTOMY  10/30/2002   right  . BREAST RECONSTRUCTION WITH PLACEMENT OF TISSUE EXPANDER AND FLEX HD (ACELLULAR HYDRATED DERMIS) Right 09/07/2019   Procedure: RIGHT BREAST RECONSTRUCTION WITH PLACEMENT OF TISSUE EXPANDER AND FLEX HD (ACELLULAR HYDRATED DERMIS);  Surgeon: Wallace Going, DO;  Location: [facility];  Service: Plastics;  Laterality: Right;  . CARDIAC CATHETERIZATION    . Carpal tunnel R/L  1992  . Cervical spine fusion X2 since last visit    . COLONOSCOPY WITH PROPOFOL N/A 09/28/2015   Procedure: COLONOSCOPY WITH PROPOFOL;  Surgeon: Danie Binder, MD;  Location: [facility];  Service: Endoscopy;  Laterality: N/A;  procedure 1 cecum time in 1232  time out 1246   total time 14 mintes  . COLONOSCOPY WITH PROPOFOL N/A 04/05/2016   Procedure: COLONOSCOPY WITH PROPOFOL;  Surgeon: Danie Binder, MD;  [facility];  Service: Endoscopy;   Laterality: N/A;  1200  . 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 DEBRIDEMENT AND CLOSURE WOUND Right 02/17/2020   Procedure: Excision of right breast wound with closure and provena placement;  Surgeon: Wallace Going, DO;  Location: [facility];  Service: Plastics;  Laterality: Right;  . ELBOW SURGERY Right  tennis elbow release 2012  . ESOPHAGOGASTRODUODENOSCOPY  10/26/2012   Procedure: ESOPHAGOGASTRODUODENOSCOPY (EGD);  Surgeon: Inda Castle, MD;  Location: [facility];  Service: Endoscopy;  Laterality: N/A;  . ESOPHAGOGASTRODUODENOSCOPY (EGD) WITH PROPOFOL N/A 09/28/2015   Procedure: ESOPHAGOGASTRODUODENOSCOPY (EGD) WITH PROPOFOL;  Surgeon: Danie Binder, MD;  [facility];  Service: Endoscopy;  Laterality: N/A;  . EYE SURGERY  2018   corneal transplants bilateral  . KNEE ARTHROSCOPY Left   . LEFT HEART CATHETERIZATION WITH CORONARY ANGIOGRAM N/A 10/23/2012   Procedure: LEFT HEART  CATHETERIZATION WITH CORONARY ANGIOGRAM;  Surgeon: Peter M Martinique, MD;  Location: [facility] CATH LAB;  Service: Cardiovascular;  Laterality: N/A;  . MASTECTOMY Right   . MASTECTOMY W/ SENTINEL NODE BIOPSY Right 09/07/2019   Procedure: RIGHT MASTECTOMY WITH SENTINEL LYMPH NODE BIOPSY;  Surgeon: Jovita Kussmaul, MD;  Location: [facility];  Service: General;  Laterality: Right;  . OVARIAN CYST REMOVAL     prior to hysterectomy, right  . Ray cage fusion  1997  . REMOVAL OF TISSUE EXPANDER Right 12/31/2019   Procedure: REMOVAL OF TISSUE EXPANDER AND EXCESS SKIN;  Surgeon: Wallace Going, DO;  Location: [place];  Service: Plastics;  Laterality: Right;  1 hour  . SENTINEL LYMPH NODE BIOPSY  10/30/2002   right  . TONSILLECTOMY     at 43 months old    I have reviewed the social history and family history with the patient and they are unchanged from previous note.  ALLERGIES:  is allergic to bee venom, haloperidol lactate, meperidine hcl, and xifaxan  [rifaximin].  MEDICATIONS:  Current Outpatient Medications  Medication Sig Dispense Refill  . acetaminophen (TYLENOL) 500 MG tablet Take 1,000 mg by mouth every 6 (six) hours as needed for moderate pain or headache.    . ALPRAZolam (XANAX) 0.5 MG tablet Take 1 tablet (0.5 mg total) by mouth daily. 90 tablet 1  . anastrozole (ARIMIDEX) 1 MG tablet Take 1 tablet (1 mg total) by mouth daily. 90 tablet 3  . Calcium-Magnesium-Vitamin D (CALCIUM 500 PO) Take 1 tablet by mouth daily.     . diclofenac sodium (VOLTAREN) 1 % GEL Apply 1 application topically 3 (three) times daily as needed (pain).     Marland Kitchen EPINEPHrine (EPI-PEN) 0.3 mg/0.3 mL DEVI Inject 0.3 mg into the muscle as needed (bee stings).     . fish oil-omega-3 fatty acids 1000 MG capsule Take 1 g by mouth every morning.     . gabapentin (NEURONTIN) 100 MG capsule Take 1 capsule (100 mg total) by mouth 3 (three) times daily. 90 capsule 1  . hydrochlorothiazide (MICROZIDE) 12.5 MG capsule TAKE 1 CAPSULE DAILY 90 capsule 3  . hydroxychloroquine (PLAQUENIL) 200 MG tablet Take 400 mg by mouth daily.    Marland Kitchen leflunomide (ARAVA) 20 MG tablet Take 20 mg by mouth daily.    . metoprolol succinate (TOPROL-XL) 50 MG 24 hr tablet TAKE 1 TABLET TWICE A DAY  WITH OR IMMEDIATELY        FOLLOWING A MEAL 180 tablet 2  . Multiple Vitamin (MULITIVITAMIN WITH MINERALS) TABS Take 1 tablet by mouth every morning.    Marland Kitchen NIFEdipine (PROCARDIA XL/NIFEDICAL-XL) 90 MG 24 hr tablet TAKE 1 TABLET DAILY 90 tablet 3  . nitroGLYCERIN (NITROSTAT) 0.4 MG SL tablet Place 1 tablet (0.4 mg total) under the tongue every 5 (five) minutes x 3 doses as needed for chest pain. 25 tablet 3  . pantoprazole (PROTONIX) 40 MG tablet TAKE 1 TABLET DAILY 90 tablet 3   Current Facility-Administered Medications  Medication Dose Route Frequency Provider Last Rate Last Admin  . betamethasone acetate-betamethasone sodium phosphate (CELESTONE) injection 3 mg  3 mg Intramuscular Once Edrick Kins, DPM         PHYSICAL EXAMINATION: ECOG PERFORMANCE STATUS: 0 - Asymptomatic  Vitals:   04/26/21 1003  BP: 132/72  Pulse: 98  Resp: 17  Temp: 98 F (36.7 C)  SpO2: 97%   Filed Weights   04/26/21 1003  Weight: 181 lb 3.2 oz (82.2  kg)    GENERAL:alert, no distress and comfortable SKIN: skin color, texture, turgor are normal, no rashes or significant lesions EYES: normal, Conjunctiva are pink and non-injected, sclera clear  NECK: supple, thyroid normal size, non-tender, without nodularity LYMPH:  no palpable lymphadenopathy in the cervical, axillary  LUNGS: clear to auscultation and percussion with normal breathing effort HEART: regular rate & rhythm and no murmurs and no lower extremity edema ABDOMEN:abdomen soft, non-tender and normal bowel sounds Musculoskeletal:no cyanosis of digits and no clubbing  NEURO: alert & oriented x 3 with fluent speech, no focal motor/sensory deficits BREAST: s/p right mastectomy and reconstruction surgery: Surgical incisions healed well with mild scar tissue. No palpable mass, nodules or adenopathy bilaterally. Breast exam benign.   LABORATORY DATA:  I have reviewed the data as listed CBC Latest Ref Rng & Units 04/26/2021 10/26/2020 04/25/2020  WBC 4.0 - 10.5 K/uL 9.5 9.5 7.7  Hemoglobin 12.0 - 15.0 g/dL 13.8 13.8 13.9  Hematocrit 36.0 - 46.0 % 40.5 40.4 41.7  Platelets 150 - 400 K/uL 417(H) 375 333     CMP Latest Ref Rng & Units 04/26/2021 10/26/2020 04/25/2020  Glucose 70 - 99 mg/dL 114(H) 103(H) 115(H)  BUN 6 - 20 mg/dL 12 14 13   Creatinine 0.44 - 1.00 mg/dL 0.80 0.82 0.83  Sodium 135 - 145 mmol/L 140 139 142  Potassium 3.5 - 5.1 mmol/L 4.0 4.1 3.9  Chloride 98 - 111 mmol/L 103 102 104  CO2 22 - 32 mmol/L 27 25 27   Calcium 8.9 - 10.3 mg/dL 9.8 9.6 9.5  Total Protein 6.5 - 8.1 g/dL 7.7 7.9 7.8  Total Bilirubin 0.3 - 1.2 mg/dL 0.5 0.7 0.7  Alkaline Phos 38 - 126 U/L 106 102 134(H)  AST 15 - 41 U/L 32 23 31  ALT 0 - 44 U/L 25 14 21        RADIOGRAPHIC STUDIES: I have personally reviewed the radiological images as listed and agreed with the findings in the report. No results found.   ASSESSMENT & PLAN:  Rachel Sutton is a 60 y.o. female with    1.Malignant neoplasm of upper-outer quadrant of right breast, StageIA,pT1cN0M0, ER+/PP+, HER2-, Grade1 -She was diagnosed in 08/2019. Given prior right lumpectomy and RT she underwent right mastectomy on 09/07/19.She has a low risk RS of 17 and adjuvant chemotherapywasnot recommended. -She has had mastectomy, no adjuvant radiation is needed due to the early stage disease. She underwent right breast reconstruction on 9/47/09 with wound complications afterward.  -She started anastrozole before surgeryon 10/28/19. Will continue for 5-7 years.She is tolerating well with mood swings.  -She is clinically doing well. Lab reviewed, her CBC and CMP are within normal limits except Plt 417K, BG 114. Her physical exam and her 08/15/20 mammogram were unremarkable. There is no clinical concern for recurrence. -Continue Surveillance. Next mammogram in 07/2021.  -Continue Anastrozole  -F/u in 6 monthswith NP Lacie    2. H/o Right breastductalcarcinoma,pT1cN0M0 stage IA,ER-/PR-, HER2 +, Grade 3, Genetictesting was negative for pathogenetic mutations. -Diagnosed on 10/16/02 managed by Dr. Eston Esters  -Treated with rightlumpectomyand SNLB, Adjuvant chemoAC-Docetaxol/Herceptinand RT  3. Osteopenia  -Her 11/2012 DEXA was normal.  -I discussed she has Rheumatoid Arthritis and she is on Anastrozole which can weaken her bone density.  -07/2020 DEXA showed osteopenia with lowest T-score -1.1 at left hip. Continue calcium and Vitd, I encouraged her to exercise  -Repeat DEXA in 07/2022.   4. Rheumatoid arthritis, diagnosed in 2018.  -She is seen by Rheumatologist.  -  She was previously on low dose weekly methotrexate and Cimzia self-injections q2weeks. She stopped after breast  cancer diagnosisin 08/2019 -Her rheumatologist Dr. Kathlene November is still on Leflunomide, which is fine with [place].  5. H/o Mitral Valve, HTN, GERD, Smoking Cessation -Had cardiac cath in 2013, no stent placed.  -She used to smokes 2 ppd. She has over a 30 year history. She has been counseled on smoking cessation.  6. Anxiety, Mood swings -She was on Paxil before and currently on Xanax 0.71m -Her anxiety is controlled but notes depression from mood swings secondary to anastrozole.  -I discussed restarting Paxil. She will consider it. She will see her psychiatrist in a few months    PLAN: -Copy labs to Dr. AKathlene Novemberabout her lab  -Lab and f/u in 6 months with NP Lacie -Continue Anastrozole  -Mammogram in 07/2021.  -she will discuss restarting Paxil with her psychiatrist soon    No problem-specific Assessment & Plan notes found for this encounter.   No orders of the defined types were placed in this encounter.  All questions were answered. The patient knows to call the clinic with any problems, questions or concerns. No barriers to learning was detected. The total time spent in the appointment was 30 minutes.     YTruitt Merle MD 04/26/2021   I, AJoslyn Devon am acting as scribe for YTruitt Merle MD.   I have reviewed the above documentation for accuracy and completeness, and I agree with the above.

## 2021-04-24 ENCOUNTER — Other Ambulatory Visit: Payer: Self-pay | Admitting: Nurse Practitioner

## 2021-04-24 DIAGNOSIS — Z170 Estrogen receptor positive status [ER+]: Secondary | ICD-10-CM

## 2021-04-24 DIAGNOSIS — C50411 Malignant neoplasm of upper-outer quadrant of right female breast: Secondary | ICD-10-CM

## 2021-04-26 ENCOUNTER — Inpatient Hospital Stay: Payer: Commercial Managed Care - PPO | Attending: Hematology

## 2021-04-26 ENCOUNTER — Inpatient Hospital Stay: Payer: Commercial Managed Care - PPO | Admitting: Hematology

## 2021-04-26 ENCOUNTER — Encounter: Payer: Self-pay | Admitting: Hematology

## 2021-04-26 ENCOUNTER — Other Ambulatory Visit: Payer: Self-pay

## 2021-04-26 VITALS — BP 132/72 | HR 98 | Temp 98.0°F | Resp 17 | Wt 181.2 lb

## 2021-04-26 DIAGNOSIS — C50411 Malignant neoplasm of upper-outer quadrant of right female breast: Secondary | ICD-10-CM

## 2021-04-26 DIAGNOSIS — F32A Depression, unspecified: Secondary | ICD-10-CM | POA: Diagnosis not present

## 2021-04-26 DIAGNOSIS — Z170 Estrogen receptor positive status [ER+]: Secondary | ICD-10-CM | POA: Diagnosis not present

## 2021-04-26 DIAGNOSIS — M85852 Other specified disorders of bone density and structure, left thigh: Secondary | ICD-10-CM | POA: Diagnosis not present

## 2021-04-26 DIAGNOSIS — Z79899 Other long term (current) drug therapy: Secondary | ICD-10-CM | POA: Diagnosis not present

## 2021-04-26 DIAGNOSIS — Z79811 Long term (current) use of aromatase inhibitors: Secondary | ICD-10-CM | POA: Insufficient documentation

## 2021-04-26 DIAGNOSIS — F419 Anxiety disorder, unspecified: Secondary | ICD-10-CM | POA: Diagnosis not present

## 2021-04-26 DIAGNOSIS — M069 Rheumatoid arthritis, unspecified: Secondary | ICD-10-CM | POA: Diagnosis not present

## 2021-04-26 LAB — CBC WITH DIFFERENTIAL (CANCER CENTER ONLY)
Abs Immature Granulocytes: 0.03 10*3/uL (ref 0.00–0.07)
Basophils Absolute: 0.1 10*3/uL (ref 0.0–0.1)
Basophils Relative: 1 %
Eosinophils Absolute: 0.3 10*3/uL (ref 0.0–0.5)
Eosinophils Relative: 3 %
HCT: 40.5 % (ref 36.0–46.0)
Hemoglobin: 13.8 g/dL (ref 12.0–15.0)
Immature Granulocytes: 0 %
Lymphocytes Relative: 39 %
Lymphs Abs: 3.7 10*3/uL (ref 0.7–4.0)
MCH: 29.1 pg (ref 26.0–34.0)
MCHC: 34.1 g/dL (ref 30.0–36.0)
MCV: 85.4 fL (ref 80.0–100.0)
Monocytes Absolute: 0.8 10*3/uL (ref 0.1–1.0)
Monocytes Relative: 8 %
Neutro Abs: 4.6 10*3/uL (ref 1.7–7.7)
Neutrophils Relative %: 49 %
Platelet Count: 417 10*3/uL — ABNORMAL HIGH (ref 150–400)
RBC: 4.74 MIL/uL (ref 3.87–5.11)
RDW: 13.2 % (ref 11.5–15.5)
WBC Count: 9.5 10*3/uL (ref 4.0–10.5)
nRBC: 0 % (ref 0.0–0.2)

## 2021-04-26 LAB — CMP (CANCER CENTER ONLY)
ALT: 25 U/L (ref 0–44)
AST: 32 U/L (ref 15–41)
Albumin: 3.9 g/dL (ref 3.5–5.0)
Alkaline Phosphatase: 106 U/L (ref 38–126)
Anion gap: 10 (ref 5–15)
BUN: 12 mg/dL (ref 6–20)
CO2: 27 mmol/L (ref 22–32)
Calcium: 9.8 mg/dL (ref 8.9–10.3)
Chloride: 103 mmol/L (ref 98–111)
Creatinine: 0.8 mg/dL (ref 0.44–1.00)
GFR, Estimated: >60 mL/min (ref >60)
Glucose, Bld: 114 mg/dL — ABNORMAL HIGH (ref 70–99)
Potassium: 4 mmol/L (ref 3.5–5.1)
Sodium: 140 mmol/L (ref 135–145)
Total Bilirubin: 0.5 mg/dL (ref 0.3–1.2)
Total Protein: 7.7 g/dL (ref 6.5–8.1)

## 2021-04-26 MED ORDER — ANASTROZOLE 1 MG PO TABS: 1.0000 mg | ORAL_TABLET | Freq: Every day | ORAL | 3 refills | Status: DC

## 2021-05-17 ENCOUNTER — Ambulatory Visit (HOSPITAL_COMMUNITY)
Admission: RE | Admit: 2021-05-17 | Discharge: 2021-05-17 | Disposition: A | Discharge status: Home / Self Care | Payer: Commercial Managed Care - PPO | Source: Ambulatory Visit | Attending: Cardiology | Admitting: Cardiology

## 2021-05-17 ENCOUNTER — Other Ambulatory Visit: Payer: Self-pay

## 2021-05-17 DIAGNOSIS — I341 Nonrheumatic mitral (valve) prolapse: Secondary | ICD-10-CM | POA: Diagnosis not present

## 2021-05-17 LAB — ECHOCARDIOGRAM COMPLETE
AR max vel: 2.8 cm2
AV Area VTI: 2.57 cm2
AV Area mean vel: 2.54 cm2
AV Mean grad: 3.7 mmHg
AV Peak grad: 6.8 mmHg
Ao pk vel: 1.3 m/s
Area-P 1/2: 3.2 cm2
S' Lateral: 3.1 cm

## 2021-05-17 NOTE — Progress Notes (Signed)
*  PRELIMINARY RESULTS* Echocardiogram 2D Echocardiogram has been performed.  Rachel Sutton 05/17/2021, 10:07 AM

## 2021-06-12 ENCOUNTER — Other Ambulatory Visit: Payer: Self-pay | Admitting: Podiatry

## 2021-06-14 ENCOUNTER — Ambulatory Visit: Payer: Commercial Managed Care - PPO | Admitting: Cardiology

## 2021-07-03 ENCOUNTER — Telehealth (HOSPITAL_COMMUNITY): Payer: Commercial Managed Care - PPO | Admitting: Psychiatry

## 2021-07-03 NOTE — Progress Notes (Signed)
Cardiology Office Note  Date: 07/04/2021   ID: Rachel Sutton, DOB 08-Jan-1961, MRN [id number]  PCP:  Janie Morning, DO  Cardiologist:  Rozann Lesches, MD Electrophysiologist:  None   Chief Complaint  Patient presents with   Cardiac follow-up    History of Present Illness: Rachel Sutton is a 60 y.o. female last assessed via telehealth encounter in May 2021.  She is here today with her granddaughter for a follow-up visit.  From a cardiac perspective, she has not had any angina symptoms, no palpitations or syncope.  She is working in a pressure washing business.  Recent follow-up echocardiogram in June revealed LVEF 60 to 65% with mild diastolic dysfunction, normal RV contraction, and no significant mitral regurgitation.  I discussed the results with her today.  Medications are noted below.  I personally reviewed her ECG which shows sinus rhythm with left atrial enlargement.  Past Medical History:  Diagnosis Date   Anxiety    Arthritis    Breast cancer ([place]) 02/22/2012   Right T1c, N0   C. difficile colitis    Colitis, collagenous 09/22/2015   Depression    Essential hypertension    Family history of brain cancer    Family history of esophageal cancer    Family history of lung cancer    Family history of lymphoma    Family history of pancreatic cancer    Fuchs' corneal dystrophy    GERD (gastroesophageal reflux disease)    Hypertension    Mitral valve prolapse    Mild mitral regurgitation   Mixed hyperlipidemia    Seizures ([place])    Only with dose of haldol; no more since that was stopped.    Past Surgical History:  Procedure Laterality Date   ABDOMINAL HYSTERECTOMY  1999   BACK SURGERY     BIOPSY N/A 09/28/2015   Procedure: BIOPSY;  Surgeon: Danie Binder, MD;  [facility];  Service: Endoscopy;  Laterality: N/A;   BIOPSY  04/05/2016   Procedure: BIOPSY;  Surgeon: Danie Binder, MD;  [facility];  Service: Endoscopy;;  colon bx   BREAST  LUMPECTOMY  10/30/2002   right   BREAST RECONSTRUCTION WITH PLACEMENT OF TISSUE EXPANDER AND FLEX HD (ACELLULAR HYDRATED DERMIS) Right 09/07/2019   Procedure: RIGHT BREAST RECONSTRUCTION WITH PLACEMENT OF TISSUE EXPANDER AND FLEX HD (ACELLULAR HYDRATED DERMIS);  Surgeon: Wallace Going, DO;  Location: [place];  Service: Plastics;  Laterality: Right;   CARDIAC CATHETERIZATION     Carpal tunnel R/L  1992   Cervical spine fusion X2 since last visit     COLONOSCOPY WITH PROPOFOL N/A 09/28/2015   Procedure: COLONOSCOPY WITH PROPOFOL;  Surgeon: Danie Binder, MD;  [facility];  Service: Endoscopy;  Laterality: N/A;  procedure 1 cecum time in 1232  time out 1246   total time 14 mintes   COLONOSCOPY WITH PROPOFOL N/A 04/05/2016   Procedure: COLONOSCOPY WITH PROPOFOL;  Surgeon: Danie Binder, MD;  [facility];  Service: Endoscopy;  Laterality: N/A;  1200   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   DEBRIDEMENT AND CLOSURE WOUND Right 02/17/2020   Procedure: Excision of right breast wound with closure and provena placement;  Surgeon: Wallace Going, DO;  Location: [place];  Service: Plastics;  Laterality: Right;   ELBOW SURGERY Right  tennis elbow release 2012   ESOPHAGOGASTRODUODENOSCOPY  10/26/2012   Procedure: ESOPHAGOGASTRODUODENOSCOPY (EGD);  Surgeon: Inda Castle,  MD;  Location: [place] [facility];  Service: Endoscopy;  Laterality: N/A;   ESOPHAGOGASTRODUODENOSCOPY (EGD) WITH PROPOFOL N/A 09/28/2015   Procedure: ESOPHAGOGASTRODUODENOSCOPY (EGD) WITH PROPOFOL;  Surgeon: Danie Binder, MD;  [facility];  Service: Endoscopy;  Laterality: N/A;   EYE SURGERY  2018   corneal transplants bilateral   KNEE ARTHROSCOPY Left    LEFT HEART CATHETERIZATION WITH CORONARY ANGIOGRAM N/A 10/23/2012   Procedure: LEFT HEART CATHETERIZATION WITH CORONARY ANGIOGRAM;  Surgeon: Peter M Martinique, MD;  Location: [facility];  Service: Cardiovascular;  Laterality: N/A;    MASTECTOMY Right    MASTECTOMY W/ SENTINEL NODE BIOPSY Right 09/07/2019   Procedure: RIGHT MASTECTOMY WITH SENTINEL LYMPH NODE BIOPSY;  Surgeon: Jovita Kussmaul, MD;  Location: [place];  Service: General;  Laterality: Right;   OVARIAN CYST REMOVAL     prior to hysterectomy, right   Ray cage fusion  [place] Right 12/31/2019   Procedure: REMOVAL OF TISSUE EXPANDER AND EXCESS SKIN;  Surgeon: Wallace Going, DO;  Location: [place];  Service: Plastics;  Laterality: Right;  1 hour   SENTINEL LYMPH NODE BIOPSY  10/30/2002   right   TONSILLECTOMY     at six months old    Current Outpatient Medications  Medication Sig Dispense Refill   acetaminophen (TYLENOL) 500 MG tablet Take 1,000 mg by mouth every 6 (six) hours as needed for moderate pain or headache.     ALPRAZolam (XANAX) 0.5 MG tablet Take 1 tablet (0.5 mg total) by mouth daily. 90 tablet 1   anastrozole (ARIMIDEX) 1 MG tablet Take 1 tablet (1 mg total) by mouth daily. 90 tablet 3   Calcium-Magnesium-Vitamin D (CALCIUM 500 PO) Take 1 tablet by mouth daily.      diclofenac sodium (VOLTAREN) 1 % GEL Apply 1 application topically 3 (three) times daily as needed (pain).      EPINEPHrine (EPI-PEN) 0.3 mg/0.3 mL DEVI Inject 0.3 mg into the muscle as needed (bee stings).      fish oil-omega-3 fatty acids 1000 MG capsule Take 1 g by mouth every morning.      gabapentin (NEURONTIN) 100 MG capsule TAKE 1 CAPSULE(100 MG) BY MOUTH THREE TIMES DAILY 90 capsule 1   hydrochlorothiazide (MICROZIDE) 12.5 MG capsule TAKE 1 CAPSULE DAILY 90 capsule 3   hydroxychloroquine (PLAQUENIL) 200 MG tablet Take 400 mg by mouth daily.     leflunomide (ARAVA) 20 MG tablet Take 20 mg by mouth daily.     metoprolol succinate (TOPROL-XL) 50 MG 24 hr tablet TAKE 1 TABLET TWICE A DAY  WITH OR IMMEDIATELY        FOLLOWING A MEAL 180 tablet 2   Multiple Vitamin (MULITIVITAMIN WITH MINERALS) TABS Take 1 tablet by mouth every morning.      NIFEdipine (PROCARDIA XL/NIFEDICAL-XL) 90 MG 24 hr tablet TAKE 1 TABLET DAILY 90 tablet 3   nitroGLYCERIN (NITROSTAT) 0.4 MG SL tablet Place 1 tablet (0.4 mg total) under the tongue every 5 (five) minutes x 3 doses as needed for chest pain. 25 tablet 3   pantoprazole (PROTONIX) 40 MG tablet TAKE 1 TABLET DAILY 90 tablet 3   Current Facility-Administered Medications  Medication Dose Route Frequency Provider Last Rate Last Admin   betamethasone acetate-betamethasone sodium phosphate (CELESTONE) injection 3 mg  3 mg Intramuscular Once Edrick Kins, DPM       Allergies:  Bee venom, Haloperidol lactate, Meperidine hcl, and Xifaxan [rifaximin]   ROS:  No orthopnea or PND.  Reflux symptoms.  Physical Exam: VS:  BP (!) 144/88   Pulse 97   Ht 5\' 3"  (1.6 m)   Wt 154 lb 6.4 oz (70 kg)   SpO2 95%   BMI 27.35 kg/m , BMI Body mass index is 27.35 kg/m.  Wt Readings from Last 3 Encounters:  07/04/21 154 lb 6.4 oz (70 kg)  04/26/21 181 lb 3.2 oz (82.2 kg)  10/26/20 166 lb 14.4 oz (75.7 kg)    General: Patient appears comfortable at rest. HEENT: Conjunctiva and lids normal, wearing a mask. Neck: Supple, no elevated JVP or carotid bruits, no thyromegaly. Lungs: Clear to auscultation, nonlabored breathing at rest. Cardiac: Regular rate and rhythm, no S3 or significant systolic murmur, no pericardial rub. Extremities: No pitting edema.  ECG:  An ECG dated 09/03/2019 was personally reviewed today and demonstrated:  Sinus rhythm.  Recent Labwork: 04/26/2021: ALT 25; AST 32; BUN 12; Creatinine 0.80; Hemoglobin 13.8; Platelet Count 417; Potassium 4.0; Sodium 140   Other Studies Reviewed Today:  Echocardiogram 05/17/2021:  1. Left ventricular ejection fraction, by estimation, is 60 to 65%. The  left ventricle has normal function. The left ventricle has no regional  wall motion abnormalities. Left ventricular diastolic parameters are  consistent with Grade I diastolic  dysfunction (impaired  relaxation).   2. Right ventricular systolic function is normal. The right ventricular  size is normal.   3. Left atrial size was mildly dilated.   4. The mitral valve is normal in structure. No evidence of mitral valve  regurgitation. No evidence of mitral stenosis.   5. The aortic valve is tricuspid. Aortic valve regurgitation is not  visualized. No aortic stenosis is present.   6. The inferior vena cava is normal in size with greater than 50%  respiratory variability, suggesting right atrial pressure of 3 mmHg.   Assessment and Plan:  1.  History of mitral valve prolapse, no major structural valvular abnormalities by recent follow-up echocardiogram and no significant mitral regurgitation.  LVEF normal at 60-65%.  2.  Essential hypertension, on Toprol-XL, Procardia and HCTZ.  Medication Adjustments/Labs and Tests Ordered: Current medicines are reviewed at length with the patient today.  Concerns regarding medicines are outlined above.   Tests Ordered: Orders Placed This Encounter  Procedures   EKG 12-Lead    Medication Changes: No orders of the defined types were placed in this encounter.   Disposition:  Follow up  1 year.  Signed, Satira Sark, MD, [facility] 07/04/2021 9:08 AM    [place] [facility] [address] [place],  [address] Phone: [phone]; Fax: [phone]

## 2021-07-04 ENCOUNTER — Encounter: Payer: Self-pay | Admitting: Cardiology

## 2021-07-04 ENCOUNTER — Other Ambulatory Visit: Payer: Self-pay

## 2021-07-04 ENCOUNTER — Ambulatory Visit: Payer: Commercial Managed Care - PPO | Admitting: Cardiology

## 2021-07-04 VITALS — BP 144/88 | HR 97 | Ht 63.0 in | Wt 154.4 lb

## 2021-07-04 DIAGNOSIS — I10 Essential (primary) hypertension: Secondary | ICD-10-CM | POA: Diagnosis not present

## 2021-07-04 DIAGNOSIS — I341 Nonrheumatic mitral (valve) prolapse: Secondary | ICD-10-CM | POA: Diagnosis not present

## 2021-07-04 NOTE — Patient Instructions (Signed)
Medication Instructions:  Your physician recommends that you continue on your current medications as directed. Please refer to the Current Medication list given to you today.  *If you need a refill on your cardiac medications before your next appointment, please call your pharmacy*   Lab Work: None If you have labs (blood work) drawn today and your tests are completely normal, you will receive your results only by: [facility] (if you have MyChart) OR A paper copy in the mail If you have any lab test that is abnormal or we need to change your treatment, we will call you to review the results.   Testing/Procedures: Noen   Follow-Up: At [facility] you and your health needs are our priority.  As part of our continuing mission to provide you with exceptional heart care, we have created designated Provider Care Teams.  These Care Teams include your primary Cardiologist (physician) and Advanced Practice Providers (APPs -  Physician Assistants and Nurse Practitioners) who all work together to provide you with the care you need, when you need it.  We recommend signing up for the patient portal called "MyChart".  Sign up information is provided on this After Visit Summary.  MyChart is used to connect with patients for Virtual Visits (Telemedicine).  Patients are able to view lab/test results, encounter notes, upcoming appointments, etc.  Non-urgent messages can be sent to your provider as well.   To learn more about what you can do with MyChart, go to [email or web address].    Your next appointment:   1 year(s)  The format for your next appointment:   In Person  Provider:   Rozann Lesches, MD   Other Instructions

## 2021-07-25 ENCOUNTER — Other Ambulatory Visit: Payer: Self-pay | Admitting: Cardiology

## 2021-07-31 ENCOUNTER — Telehealth (HOSPITAL_COMMUNITY): Payer: Commercial Managed Care - PPO | Admitting: Psychiatry

## 2021-08-01 ENCOUNTER — Other Ambulatory Visit: Payer: Self-pay | Admitting: Hematology

## 2021-08-01 DIAGNOSIS — Z1231 Encounter for screening mammogram for malignant neoplasm of breast: Secondary | ICD-10-CM

## 2021-08-10 ENCOUNTER — Other Ambulatory Visit: Payer: Self-pay

## 2021-08-10 ENCOUNTER — Telehealth (INDEPENDENT_AMBULATORY_CARE_PROVIDER_SITE_OTHER): Payer: Commercial Managed Care - PPO | Admitting: Psychiatry

## 2021-08-10 ENCOUNTER — Encounter (HOSPITAL_COMMUNITY): Payer: Self-pay | Admitting: Psychiatry

## 2021-08-10 DIAGNOSIS — F322 Major depressive disorder, single episode, severe without psychotic features: Secondary | ICD-10-CM | POA: Diagnosis not present

## 2021-08-10 MED ORDER — TRAZODONE HCL 50 MG PO TABS
50.0000 mg | ORAL_TABLET | Freq: Every day | ORAL | 2 refills | Status: DC
Start: 2021-08-10 — End: 2021-11-24

## 2021-08-10 MED ORDER — DULOXETINE HCL 60 MG PO CPEP: 60.0000 mg | ORAL_CAPSULE | Freq: Every day | ORAL | 3 refills | Status: DC

## 2021-08-10 MED ORDER — ALPRAZOLAM 0.5 MG PO TABS: 0.5000 mg | ORAL_TABLET | Freq: Two times a day (BID) | ORAL | 1 refills | Status: DC | PRN

## 2021-08-10 NOTE — Progress Notes (Signed)
Virtual Visit via Telephone Note  I connected with Rachel Sutton on 08/10/21 at  9:40 AM EDT by telephone and verified that I am speaking with the correct person using two identifiers.  Location: Patient: home Provider: home office   I discussed the limitations, risks, security and privacy concerns of performing an evaluation and management service by telephone and the availability of in person appointments. I also discussed with the patient that there may be a patient responsible charge related to this service. The patient expressed understanding and agreed to proceed.       I discussed the assessment and treatment plan with the patient. The patient was provided an opportunity to ask questions and all were answered. The patient agreed with the plan and demonstrated an understanding of the instructions.   The patient was advised to call back or seek an in-person evaluation if the symptoms worsen or if the condition fails to improve as anticipated.  I provided 15 minutes of non-face-to-face time during this encounter.   Levonne Spiller, MD  [facility] MD/PA/NP OP Progress Note  08/10/2021 10:15 AM Rachel Sutton  MRN:  [id number]  Chief Complaint:  Chief Complaint   Anxiety; Follow-up    HPI: This patient is a 59 year old married white female who is currently living in [place].  She works as an Optometrist for [employer]  The patient returns after 7 months regarding her depression and anxiety.  She states that recently she has been doing worse.  She contracted COVID about 4 weeks ago and was fairly sick at home.  She did receive antiviral medication and it helped.  However she still is very fatigued and does not have much energy.  She is also much more anxious.  She states she is having difficulties with both of her children.  Her son will let anyone see his his children, her grandchildren for fear of COVID.  She really misses them.  Her daughter's "just wants to borrow my money".   They do not not have a good relationship.  The patient also feels isolated since she has been working from home for 2 years.  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  All of this is served to make her feel a bit more depressed and anxious.  She she is only sleeping 3 to 4 hours a night.  In the past she had been on an antidepressant but does not want to go back on Paxil.  I suggest we try Cymbalta since she also has chronic pain from arthritis and she agrees.  We can also increase the Xanax temporarily to help with anxiety and add trazodone for sleep Visit Diagnosis:    ICD-10-CM   1. Major depressive disorder, single episode, severe without psychotic features ([place])  F32.2       Past Psychiatric History: none  Past Medical History:  Past Medical History:  Diagnosis Date   Anxiety    Arthritis    Breast cancer ([place]) 02/22/2012   Right T1c, N0   C. difficile colitis    Colitis, collagenous 09/22/2015   Depression    Essential hypertension    Family history of brain cancer    Family history of esophageal cancer    Family history of lung cancer    Family history of lymphoma    Family history of pancreatic cancer    Fuchs' corneal dystrophy    GERD (gastroesophageal reflux disease)    Hypertension  Mitral valve prolapse    Mild mitral regurgitation   Mixed hyperlipidemia    Seizures ([place])    Only with dose of haldol; no more since that was stopped.    Past Surgical History:  Procedure Laterality Date   ABDOMINAL HYSTERECTOMY  1999   BACK SURGERY     BIOPSY N/A 09/28/2015   Procedure: BIOPSY;  Surgeon: Danie Binder, MD;  [facility];  Service: Endoscopy;  Laterality: N/A;   BIOPSY  04/05/2016   Procedure: BIOPSY;  Surgeon: Danie Binder, MD;  [facility];  Service: Endoscopy;;  colon bx   BREAST LUMPECTOMY  10/30/2002   right   BREAST RECONSTRUCTION WITH PLACEMENT OF TISSUE EXPANDER AND FLEX HD (ACELLULAR  HYDRATED DERMIS) Right 09/07/2019   Procedure: RIGHT BREAST RECONSTRUCTION WITH PLACEMENT OF TISSUE EXPANDER AND FLEX HD (ACELLULAR HYDRATED DERMIS);  Surgeon: Wallace Going, DO;  Location: [place];  Service: Plastics;  Laterality: Right;   CARDIAC CATHETERIZATION     Carpal tunnel R/L  1992   Cervical spine fusion X2 since last visit     COLONOSCOPY WITH PROPOFOL N/A 09/28/2015   Procedure: COLONOSCOPY WITH PROPOFOL;  Surgeon: Danie Binder, MD;  [facility];  Service: Endoscopy;  Laterality: N/A;  procedure 1 cecum time in 1232  time out 1246   total time 14 mintes   COLONOSCOPY WITH PROPOFOL N/A 04/05/2016   Procedure: COLONOSCOPY WITH PROPOFOL;  Surgeon: Danie Binder, MD;  [facility];  Service: Endoscopy;  Laterality: N/A;  1200   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   DEBRIDEMENT AND CLOSURE WOUND Right 02/17/2020   Procedure: Excision of right breast wound with closure and provena placement;  Surgeon: Wallace Going, DO;  Location: [place];  Service: Plastics;  Laterality: Right;   ELBOW SURGERY Right  tennis elbow release 2012   ESOPHAGOGASTRODUODENOSCOPY  10/26/2012   Procedure: ESOPHAGOGASTRODUODENOSCOPY (EGD);  Surgeon: Inda Castle, MD;  Location: [place];  Service: Endoscopy;  Laterality: N/A;   ESOPHAGOGASTRODUODENOSCOPY (EGD) WITH PROPOFOL N/A 09/28/2015   Procedure: ESOPHAGOGASTRODUODENOSCOPY (EGD) WITH PROPOFOL;  Surgeon: Danie Binder, MD;  [facility];  Service: Endoscopy;  Laterality: N/A;   EYE SURGERY  2018   corneal transplants bilateral   KNEE ARTHROSCOPY Left    LEFT HEART CATHETERIZATION WITH CORONARY ANGIOGRAM N/A 10/23/2012   Procedure: LEFT HEART CATHETERIZATION WITH CORONARY ANGIOGRAM;  Surgeon: Peter M Martinique, MD;  Location: [facility] LAB;  Service: Cardiovascular;  Laterality: N/A;   MASTECTOMY Right    MASTECTOMY W/ SENTINEL NODE BIOPSY Right 09/07/2019   Procedure: RIGHT MASTECTOMY WITH SENTINEL LYMPH  NODE BIOPSY;  Surgeon: Jovita Kussmaul, MD;  Location: [place];  Service: General;  Laterality: Right;   OVARIAN CYST REMOVAL     prior to hysterectomy, right   Ray cage fusion  [place] Right 12/31/2019   Procedure: REMOVAL OF TISSUE EXPANDER AND EXCESS SKIN;  Surgeon: Wallace Going, DO;  Location: [place];  Service: Plastics;  Laterality: Right;  1 hour   SENTINEL LYMPH NODE BIOPSY  10/30/2002   right   TONSILLECTOMY     at 36 old    Family Psychiatric History: see below  Family History:  Family History  Problem Relation Age of Onset   Diabetes Mother    Pancreatic cancer Mother 16   Lung cancer Father    Diabetes Father  Heart disease Father    Esophageal cancer Father 52       Beer drinker   Brain cancer Brother 45   Brain cancer Maternal Grandfather    Heart disease Paternal Grandfather    Cancer Maternal Uncle        unknown form of cancer; cigar smoker   Lung cancer Maternal Grandmother    Brain cancer Cousin        dx in his late 88s-40s   Schizophrenia Sister    Lymphoma Niece 100   Anesthesia problems Neg Hx    Hypotension Neg Hx    Malignant hyperthermia Neg Hx    Pseudochol deficiency Neg Hx    Colon cancer Neg Hx    Colon polyps Neg Hx    Crohn's disease Neg Hx    Ulcerative colitis Neg Hx     Social History:  Social History   Socioeconomic History   Marital status: Married    Spouse name: Not on file   Number of children: 2   Years of education: Not on file   Highest education level: Not on file  Occupational History   Occupation: Aeronautical engineer: [employer]    Comment: [employer]  Tobacco Use   Smoking status: Former    Packs/day: 0.50    Years: 35.00    Pack years: 17.50    Types: Cigarettes    Start date: 07/07/1974    Quit date: 07/14/2019    Years since quitting: 2.0   Smokeless tobacco: Never  Vaping Use   Vaping Use: Never used  Substance and Sexual Activity   Alcohol use:  Not Currently    Alcohol/week: 0.0 standard drinks    Comment: stopped July 26, 2014)   Drug use: No   Sexual activity: Not Currently    Birth control/protection: Surgical  Other Topics Concern   Not on file  Social History Narrative   Not on file   Social Determinants of Health   Financial Resource Strain: Not on file  Food Insecurity: Not on file  Transportation Needs: Not on file  Physical Activity: Not on file  Stress: Not on file  Social Connections: Not on file    Allergies:  Allergies  Allergen Reactions   Bee Venom Anaphylaxis   Haloperidol Lactate Other (See Comments)    Convulsions    Meperidine Hcl Hives   Xifaxan [Rifaximin] Diarrhea and Other (See Comments)    Constantly felt the need to use the bathroom     Metabolic Disorder Labs: No results found for: HGBA1C, MPG No results found for: PROLACTIN Lab Results  Component Value Date   CHOL 207 (H) 10/23/2012   TRIG 319 (H) 10/23/2012   HDL 33 (L) 10/23/2012   CHOLHDL 6.3 10/23/2012   VLDL 64 (H) 10/23/2012   LDLCALC 110 (H) 10/23/2012   Lab Results  Component Value Date   TSH 0.706 09/22/2015    Therapeutic Level Labs: No results found for: LITHIUM No results found for: VALPROATE No components found for:  CBMZ  Current Medications: Current Outpatient Medications  Medication Sig Dispense Refill   DULoxetine (CYMBALTA) 60 MG capsule Take 1 capsule (60 mg total) by mouth daily. 30 capsule 3   traZODone (DESYREL) 50 MG tablet Take 1 tablet (50 mg total) by mouth at bedtime. 30 tablet 2   acetaminophen (TYLENOL) 500 MG tablet Take 1,000 mg by mouth every 6 (six) hours as needed for moderate pain or headache.     ALPRAZolam Duanne Moron)  0.5 MG tablet Take 1 tablet (0.5 mg total) by mouth 2 (two) times daily as needed for anxiety. 180 tablet 1   anastrozole (ARIMIDEX) 1 MG tablet Take 1 tablet (1 mg total) by mouth daily. 90 tablet 3   Calcium-Magnesium-Vitamin D (CALCIUM 500 PO) Take 1 tablet by mouth  daily.      diclofenac sodium (VOLTAREN) 1 % GEL Apply 1 application topically 3 (three) times daily as needed (pain).      EPINEPHrine (EPI-PEN) 0.3 mg/0.3 mL DEVI Inject 0.3 mg into the muscle as needed (bee stings).      fish oil-omega-3 fatty acids 1000 MG capsule Take 1 g by mouth every morning.      gabapentin (NEURONTIN) 100 MG capsule TAKE 1 CAPSULE(100 MG) BY MOUTH THREE TIMES DAILY 90 capsule 1   hydrochlorothiazide (MICROZIDE) 12.5 MG capsule TAKE 1 CAPSULE DAILY 90 capsule 3   hydroxychloroquine (PLAQUENIL) 200 MG tablet Take 400 mg by mouth daily.     leflunomide (ARAVA) 20 MG tablet Take 20 mg by mouth daily.     metoprolol succinate (TOPROL-XL) 50 MG 24 hr tablet TAKE 1 TABLET TWICE A DAY  WITH OR IMMEDIATELY        FOLLOWING A MEAL 180 tablet 2   Multiple Vitamin (MULITIVITAMIN WITH MINERALS) TABS Take 1 tablet by mouth every morning.     NIFEdipine (PROCARDIA XL/NIFEDICAL-XL) 90 MG 24 hr tablet TAKE 1 TABLET DAILY 90 tablet 3   nitroGLYCERIN (NITROSTAT) 0.4 MG SL tablet Place 1 tablet (0.4 mg total) under the tongue every 5 (five) minutes x 3 doses as needed for chest pain. 25 tablet 3   pantoprazole (PROTONIX) 40 MG tablet TAKE 1 TABLET DAILY 90 tablet 3   Current Facility-Administered Medications  Medication Dose Route Frequency Provider Last Rate Last Admin   betamethasone acetate-betamethasone sodium phosphate (CELESTONE) injection 3 mg  3 mg Intramuscular Once Edrick Kins, DPM         Musculoskeletal: Strength & Muscle Tone:  na Gait & Station:  na Patient leans: N/A  Psychiatric Specialty Exam: Review of Systems  Constitutional:  Positive for fatigue.  Musculoskeletal:  Positive for arthralgias and myalgias.  Psychiatric/Behavioral:  Positive for dysphoric mood and sleep disturbance. The patient is nervous/anxious.   All other systems reviewed and are negative.  There were no vitals taken for this visit.There is no height or weight on file to calculate BMI.   General Appearance: NA  Eye Contact:  NA  Speech:  Clear and Coherent  Volume:  Normal  Mood:  Anxious and Dysphoric  Affect:  NA  Thought Process:  Goal Directed  Orientation:  Full (Time, Place, and Person)  Thought Content: Rumination   Suicidal Thoughts:  No  Homicidal Thoughts:  No  Memory:  Immediate;   Good Recent;   Good Remote;   Good  Judgement:  Good  Insight:  Good  Psychomotor Activity:  Decreased  Concentration:  Concentration: Good and Attention Span: Good  Recall:  Good  Fund of Knowledge: Good  Language: Good  Akathisia:  No  Handed:  Right  AIMS (if indicated): not done  Assets:  Communication Skills Desire for Improvement Resilience Social Support Talents/Skills  ADL's:  Intact  Cognition: WNL  Sleep:  Poor   Screenings: PHQ2-9    Flowsheet Row Video Visit from 08/10/2021 in [facility]  PHQ-2 Total Score 1      Flowsheet Row Video Visit from 08/10/2021 in [place]  [facility]  C-SSRS RISK CATEGORY No Risk        Assessment and Plan: This patient is a 60 year old female who went through a serious depressive episode a few years ago.  She had been in remission but because of recent stressors she seems to be a bit more depressed and anxious now.  We will start Cymbalta 60 mg daily for depression with and it may also help somewhat with chronic pain.  She will increase Xanax to 0.5 mg twice daily for anxiety and use trazodone 50 mg at bedtime as needed for sleep.  She will return to see me in 4 weeks   Levonne Spiller, MD 08/10/2021, 10:15 AM

## 2021-08-11 ENCOUNTER — Other Ambulatory Visit: Payer: Self-pay | Admitting: Cardiology

## 2021-09-05 ENCOUNTER — Ambulatory Visit: Payer: Commercial Managed Care - PPO

## 2021-09-07 ENCOUNTER — Telehealth (INDEPENDENT_AMBULATORY_CARE_PROVIDER_SITE_OTHER): Payer: Commercial Managed Care - PPO | Admitting: Psychiatry

## 2021-09-07 ENCOUNTER — Encounter (HOSPITAL_COMMUNITY): Payer: Self-pay | Admitting: Psychiatry

## 2021-09-07 ENCOUNTER — Other Ambulatory Visit: Payer: Self-pay

## 2021-09-07 DIAGNOSIS — F322 Major depressive disorder, single episode, severe without psychotic features: Secondary | ICD-10-CM | POA: Diagnosis not present

## 2021-09-07 NOTE — Progress Notes (Signed)
Virtual Visit via Video Note  I connected with [name] on 09/07/21 at  9:40 AM EDT by a video enabled telemedicine application and verified that I am speaking with the correct person using two identifiers.  Location: Patient: home Provider: home office   I discussed the limitations of evaluation and management by telemedicine and the availability of in person appointments. The patient expressed understanding and agreed to proceed.     I discussed the assessment and treatment plan with the patient. The patient was provided an opportunity to ask questions and all were answered. The patient agreed with the plan and demonstrated an understanding of the instructions.   The patient was advised to call back or seek an in-person evaluation if the symptoms worsen or if the condition fails to improve as anticipated.  I provided 10 minutes of non-face-to-face time during this encounter.   Levonne Spiller, MD  [facility] MD/PA/NP OP Progress Note  09/07/2021 9:53 AM [name]  MRN:  [id number]  Chief Complaint:  Chief Complaint   Anxiety; Depression; Follow-up    HPI: This patient is a 60 year old married white female who is currently living in [place].  She works as an Optometrist for [employer]  Patient returns for follow-up after 4 weeks.  Last time she stated that she was getting more depressed and anxious.  She had had COVID the month prior and still was having symptoms of fatigue and brain fog.  I had prescribed Cymbalta for the depression and trazodone for sleep and increase the Xanax to twice daily.  The patient states that she did not start the Cymbalta or trazodone.  She claims that she "just does not like to take all these medicines.  However she is taking the Xanax twice a day and it is made a big difference.  She is less anxious and her mood has improved and she is sleeping much better.  Her energy is slowly coming back.  She still has the other medications if she wants  to start them.  She is still having her rheumatoid arthritic pain and the Cymbalta may help this and I urged her to try it but she declined at this time. Visit Diagnosis:    ICD-10-CM   1. Major depressive disorder, single episode, severe without psychotic features ([place])  F32.2       Past Psychiatric History: none  Past Medical History:  Past Medical History:  Diagnosis Date   Anxiety    Arthritis    Breast cancer ([place]) 02/22/2012   Right T1c, N0   C. difficile colitis    Colitis, collagenous 09/22/2015   Depression    Essential hypertension    Family history of brain cancer    Family history of esophageal cancer    Family history of lung cancer    Family history of lymphoma    Family history of pancreatic cancer    Fuchs' corneal dystrophy    GERD (gastroesophageal reflux disease)    Hypertension    Mitral valve prolapse    Mild mitral regurgitation   Mixed hyperlipidemia    Seizures ([place])    Only with dose of haldol; no more since that was stopped.    Past Surgical History:  Procedure Laterality Date   ABDOMINAL HYSTERECTOMY  1999   BACK SURGERY     BIOPSY N/A 09/28/2015   Procedure: BIOPSY;  Surgeon: Danie Binder, MD;  Location: [facility];  Service: Endoscopy;  Laterality: N/A;   BIOPSY  04/05/2016  Procedure: BIOPSY;  Surgeon: Danie Binder, MD;  [facility];  Service: Endoscopy;;  colon bx   BREAST LUMPECTOMY  10/30/2002   right   BREAST RECONSTRUCTION WITH PLACEMENT OF TISSUE EXPANDER AND FLEX HD (ACELLULAR HYDRATED DERMIS) Right 09/07/2019   Procedure: RIGHT BREAST RECONSTRUCTION WITH PLACEMENT OF TISSUE EXPANDER AND FLEX HD (ACELLULAR HYDRATED DERMIS);  Surgeon: Wallace Going, DO;  Location: [place];  Service: Plastics;  Laterality: Right;   CARDIAC CATHETERIZATION     Carpal tunnel R/L  1992   Cervical spine fusion X2 since last visit     COLONOSCOPY WITH PROPOFOL N/A 09/28/2015   Procedure: COLONOSCOPY WITH PROPOFOL;  Surgeon: Danie Binder,  MD;  Location: [facility];  Service: Endoscopy;  Laterality: N/A;  procedure 1 cecum time in 1232  time out 1246   total time 14 mintes   COLONOSCOPY WITH PROPOFOL N/A 04/05/2016   Procedure: COLONOSCOPY WITH PROPOFOL;  Surgeon: Danie Binder, MD;  [facility];  Service: Endoscopy;  Laterality: N/A;  1200   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   DEBRIDEMENT AND CLOSURE WOUND Right 02/17/2020   Procedure: Excision of right breast wound with closure and provena placement;  Surgeon: Wallace Going, DO;  Location: [place];  Service: Plastics;  Laterality: Right;   ELBOW SURGERY Right  tennis elbow release 2012   ESOPHAGOGASTRODUODENOSCOPY  10/26/2012   Procedure: ESOPHAGOGASTRODUODENOSCOPY (EGD);  Surgeon: Inda Castle, MD;  Location: [place];  Service: Endoscopy;  Laterality: N/A;   ESOPHAGOGASTRODUODENOSCOPY (EGD) WITH PROPOFOL N/A 09/28/2015   Procedure: ESOPHAGOGASTRODUODENOSCOPY (EGD) WITH PROPOFOL;  Surgeon: Danie Binder, MD;  [facility];  Service: Endoscopy;  Laterality: N/A;   EYE SURGERY  2018   corneal transplants bilateral   KNEE ARTHROSCOPY Left    LEFT HEART CATHETERIZATION WITH CORONARY ANGIOGRAM N/A 10/23/2012   Procedure: LEFT HEART CATHETERIZATION WITH CORONARY ANGIOGRAM;  Surgeon: Peter M Martinique, MD;  Location: [facility] CATH [facility];  Service: Cardiovascular;  Laterality: N/A;   MASTECTOMY Right    MASTECTOMY W/ SENTINEL NODE BIOPSY Right 09/07/2019   Procedure: RIGHT MASTECTOMY WITH SENTINEL LYMPH NODE BIOPSY;  Surgeon: Jovita Kussmaul, MD;  Location: [place];  Service: General;  Laterality: Right;   OVARIAN CYST REMOVAL     prior to hysterectomy, right   Ray cage fusion  [place] Right 12/31/2019   Procedure: REMOVAL OF TISSUE EXPANDER AND EXCESS SKIN;  Surgeon: Wallace Going, DO;  Location: [place];  Service: Plastics;  Laterality: Right;  1 hour   SENTINEL LYMPH NODE BIOPSY  10/30/2002    right   TONSILLECTOMY     at 60 old    Family Psychiatric History: see below  Family History:  Family History  Problem Relation Age of Onset   Diabetes Mother    Pancreatic cancer Mother 34   Lung cancer Father    Diabetes Father    Heart disease Father    Esophageal cancer Father 75       Beer drinker   Brain cancer Brother 82   Brain cancer Maternal Grandfather    Heart disease Paternal Grandfather    Cancer Maternal Uncle        unknown form of cancer; cigar smoker   Lung cancer Maternal Grandmother    Brain cancer Cousin        dx in his late 67s-40s   Schizophrenia Sister  Lymphoma Niece 15   Anesthesia problems Neg Hx    Hypotension Neg Hx    Malignant hyperthermia Neg Hx    Pseudochol deficiency Neg Hx    Colon cancer Neg Hx    Colon polyps Neg Hx    Crohn's disease Neg Hx    Ulcerative colitis Neg Hx     Social History:  Social History   Socioeconomic History   Marital status: Married    Spouse name: Not on file   Number of children: 2   Years of education: Not on file   Highest education level: Not on file  Occupational History   Occupation: Aeronautical engineer: [employer]    Comment: [employer]  Tobacco Use   Smoking status: Former    Packs/day: 0.50    Years: 35.00    Pack years: 17.50    Types: Cigarettes    Start date: 07/07/1974    Quit date: 07/14/2019    Years since quitting: 2.1   Smokeless tobacco: Never  Vaping Use   Vaping Use: Never used  Substance and Sexual Activity   Alcohol use: Not Currently    Alcohol/week: 0.0 standard drinks    Comment: stopped July 26, 2014)   Drug use: No   Sexual activity: Not Currently    Birth control/protection: Surgical  Other Topics Concern   Not on file  Social History Narrative   Not on file   Social Determinants of Health   Financial Resource Strain: Not on file  Food Insecurity: Not on file  Transportation Needs: Not on file  Physical Activity: Not on file  Stress: Not on  file  Social Connections: Not on file    Allergies:  Allergies  Allergen Reactions   Bee Venom Anaphylaxis   Haloperidol Lactate Other (See Comments)    Convulsions    Meperidine Hcl Hives   Xifaxan [Rifaximin] Diarrhea and Other (See Comments)    Constantly felt the need to use the bathroom     Metabolic Disorder Labs: No results found for: HGBA1C, MPG No results found for: PROLACTIN Lab Results  Component Value Date   CHOL 207 (H) 10/23/2012   TRIG 319 (H) 10/23/2012   HDL 33 (L) 10/23/2012   CHOLHDL 6.3 10/23/2012   VLDL 64 (H) 10/23/2012   LDLCALC 110 (H) 10/23/2012   Lab Results  Component Value Date   TSH 0.706 09/22/2015    Therapeutic Level Labs: No results found for: LITHIUM No results found for: VALPROATE No components found for:  CBMZ  Current Medications: Current Outpatient Medications  Medication Sig Dispense Refill   acetaminophen (TYLENOL) 500 MG tablet Take 1,000 mg by mouth every 6 (six) hours as needed for moderate pain or headache.     ALPRAZolam (XANAX) 0.5 MG tablet Take 1 tablet (0.5 mg total) by mouth 2 (two) times daily as needed for anxiety. 180 tablet 1   anastrozole (ARIMIDEX) 1 MG tablet Take 1 tablet (1 mg total) by mouth daily. 90 tablet 3   Calcium-Magnesium-Vitamin D (CALCIUM 500 PO) Take 1 tablet by mouth daily.      diclofenac sodium (VOLTAREN) 1 % GEL Apply 1 application topically 3 (three) times daily as needed (pain).      DULoxetine (CYMBALTA) 60 MG capsule Take 1 capsule (60 mg total) by mouth daily. 30 capsule 3   EPINEPHrine (EPI-PEN) 0.3 mg/0.3 mL DEVI Inject 0.3 mg into the muscle as needed (bee stings).      fish oil-omega-3 fatty acids 1000  MG capsule Take 1 g by mouth every morning.      gabapentin (NEURONTIN) 100 MG capsule TAKE 1 CAPSULE(100 MG) BY MOUTH THREE TIMES DAILY 90 capsule 1   hydrochlorothiazide (MICROZIDE) 12.5 MG capsule TAKE 1 CAPSULE DAILY 90 capsule 3   hydroxychloroquine (PLAQUENIL) 200 MG tablet Take  400 mg by mouth daily.     leflunomide (ARAVA) 20 MG tablet Take 20 mg by mouth daily.     metoprolol succinate (TOPROL-XL) 50 MG 24 hr tablet TAKE 1 TABLET TWICE A DAY  WITH OR IMMEDIATELY        FOLLOWING A MEAL 180 tablet 2   Multiple Vitamin (MULITIVITAMIN WITH MINERALS) TABS Take 1 tablet by mouth every morning.     NIFEdipine (PROCARDIA XL/NIFEDICAL-XL) 90 MG 24 hr tablet TAKE 1 TABLET DAILY 90 tablet 3   nitroGLYCERIN (NITROSTAT) 0.4 MG SL tablet Place 1 tablet (0.4 mg total) under the tongue every 5 (five) minutes x 3 doses as needed for chest pain. 25 tablet 3   pantoprazole (PROTONIX) 40 MG tablet TAKE 1 TABLET DAILY 90 tablet 3   traZODone (DESYREL) 50 MG tablet Take 1 tablet (50 mg total) by mouth at bedtime. 30 tablet 2   Current Facility-Administered Medications  Medication Dose Route Frequency Provider Last Rate Last Admin   betamethasone acetate-betamethasone sodium phosphate (CELESTONE) injection 3 mg  3 mg Intramuscular Once Edrick Kins, DPM         Musculoskeletal: Strength & Muscle Tone: within normal limits Gait & Station: normal Patient leans: N/A  Psychiatric Specialty Exam: Review of Systems  Constitutional:  Positive for fatigue.  Musculoskeletal:  Positive for arthralgias and joint swelling.  All other systems reviewed and are negative.  There were no vitals taken for this visit.There is no height or weight on file to calculate BMI.  General Appearance: Casual and Fairly Groomed  Eye Contact:  Good  Speech:  Clear and Coherent  Volume:  Normal  Mood:  Euthymic  Affect:  Appropriate and Congruent  Thought Process:  Goal Directed  Orientation:  Full (Time, Place, and Person)  Thought Content: WDL   Suicidal Thoughts:  No  Homicidal Thoughts:  No  Memory:  Immediate;   Good Recent;   Good Remote;   Good  Judgement:  Good  Insight:  Good  Psychomotor Activity:  Decreased  Concentration:  Concentration: Good and Attention Span: Good  Recall:  Good   Fund of Knowledge: Good  Language: Good  Akathisia:  No  Handed:  Right  AIMS (if indicated): not done  Assets:  Communication Skills Desire for Improvement Resilience Social Support Talents/Skills  ADL's:  Intact  Cognition: WNL  Sleep:  Good   Screenings: PHQ2-9    Flowsheet Row Video Visit from 09/07/2021 in [facility] Video Visit from 08/10/2021 in [place] [facility]  PHQ-2 Total Score 0 1      Flowsheet Row Video Visit from 09/07/2021 in [place] [facility] Video Visit from 08/10/2021 in [place] No Risk No Risk        Assessment and Plan: Patient is a 60 year old female with a history of depression and anxiety.  She seemed to be a bit worse after contracting COVID but is slowly getting better.  She did not feel the need to take the antidepressant or sleep aid.  However the Xanax 0.5 mg twice daily has helped her anxiety and sleep so  she is feeling much better.  She will continue this medication and return to see me in 3 months   Levonne Spiller, MD 09/07/2021, 9:53 AM

## 2021-10-10 ENCOUNTER — Ambulatory Visit: Payer: Commercial Managed Care - PPO

## 2021-10-12 ENCOUNTER — Ambulatory Visit: Payer: Commercial Managed Care - PPO

## 2021-10-29 NOTE — Progress Notes (Deleted)
[place]   [phone] [phone]) [phone]   Clinic Follow up Note   Patient Care Team: Janie Morning, DO as PCP - General (Family Medicine) Satira Sark, MD as PCP - Cardiology (Cardiology) Lendon Colonel, NP as Nurse Practitioner (Nurse Practitioner) Mauro Kaufmann, RN as Oncology Nurse Navigator Rockwell Germany, RN as Oncology Nurse Navigator Truitt Merle, MD as Consulting Physician (Hematology) Jovita Kussmaul, MD as Consulting Physician (General Surgery) Gery Pray, MD as Consulting Physician (Radiation Oncology) Lahoma Rocker, MD as Consulting Physician (Rheumatology) Alla Feeling, NP as Nurse Practitioner (Nurse Practitioner) 10/29/2021  CHIEF COMPLAINT: Follow up right breast cancer   SUMMARY OF ONCOLOGIC HISTORY: Oncology History  Breast cancer ([place])  10/16/2002 Initial Biopsy   10/16/2002   BREAST, RIGHT, 4 O'CLOCK, NEEDLE BIOPSIES: INVASIVE AND   INTRADUCTAL CARCINOMA.  Results   IMMUNOHISTOCHEMICAL AND MORPHOMETRIC ANALYSIS BY IMAGE CYTOMETRY   (The tumor is immunocytochemically stained for estrogen and   progesterone receptors and Mib-1, and quantitated   morphometrically.)   Estrogen Receptor (Negative, <1%): NEGATIVE   Progesterone Receptor (Negative, <1%): NEGATIVE   Proliferation Marker Ki67 by MIB-1 (Low <30%): 47%  HERCEPTEST   Her 2 neu by immunocytochemical stain (Negative): 3+;   POSITIVE    10/30/2002 Surgery   Surgery 10/30/02 FINAL DIAGNOSIS    1. SENTINEL LYMPH NODE BIOPSY, RIGHT AXILLARY NODE: ONE LYMPH   NODE, NEGATIVE FOR METASTATIC CARCINOMA (0/1), SEE COMMENT.    2. LUMPECTOMY, RIGHT BREAST:   - INVASIVE MAMMARY CARCINOMA, NO SPECIAL TYPE (DUCTAL), 1.3   CM, MSBR GRADE 3 OF 3.   - DUCTAL CARCINOMA IN SITU, HIGH-GRADE.   - EXTENSIVE INTRADUCTAL COMPONENT NEGATIVE (JCRT).   - NO LYMPHOVASCULAR INVASION IDENTIFIED.   - MARGINS FREE OF TUMOR.   - SEE COMMENT AND ONCOLOGY TABLE.    2003 -  07/2003 Radiation Therapy   Underwent adjuvant radiation to 50.4 gray with an additional 10 gray boost to the surgical cavity completed 07/19/2003     - 02/2004 Chemotherapy   AC for 4 cycles and decetaxel and Herceptin for 4 cycles and then continued herceptin for totla 1 year treatment.      02/22/2012 Initial Diagnosis   Breast cancer ([place])   Malignant neoplasm of upper-outer quadrant of right breast in female, estrogen receptor positive ([place])  07/01/2019 Mammogram   Diagnostic mammogram 07/01/19  IMPRESSION: Suspicious mass in the 9:30 region of the right breast 9 cm from the nipple measuring 1.3 x 1.3 x 1.1 cm. Suspicious mass in the right axilla measuring 5 x 4 x 4 mm.   07/02/2019 Cancer Staging   Staging form: Breast, AJCC 8th Edition - Clinical stage from 07/02/2019: Stage IA (cT1c, cN0, cM0, G2, ER+, PR+, HER2: Equivocal) - Signed by Truitt Merle, MD on 07/07/2019    07/02/2019 Initial Biopsy   Diagnosis 07/02/19 1. Breast, right, needle core biopsy, 9:30 o'clock - INVASIVE DUCTAL CARCINOMA, GRADE 1-2. SEE NOTE - DUCTAL CARCINOMA IN SITU, INTERMEDIATE GRADE 2. Lymph node, needle/core biopsy, right axilla - LYMPH NODE, NEGATIVE FOR CARCINOMA   07/02/2019 Receptors her2   By immunohistochemistry, the tumor cells are EQUIVOCAL for Her2 (2+). HER2 by [place] will be PERFORMED and the RESULTS REPORTED SEPARATELY Estrogen Receptor: 100%, POSITIVE, STRONG STAINING INTENSITY Progesterone Receptor: 90%, POSITIVE, STRONG STAINING INTENSITY Proliferation Marker Ki67: 10%   07/06/2019 Initial Diagnosis   Malignant neoplasm of upper-outer quadrant of right breast in female, estrogen receptor positive ([place])   07/08/2019 -  Anti-estrogen oral therapy   Anastrozole 38m once daily starting 07/08/19 before surgery.    07/20/2019 Genetic Testing   Negative genetic testing on the [facility] Multi-Cancer Panel. The report date is 07/20/2019.  The Multi-Gene Panel offered by [facility] includes sequencing  and/or deletion duplication testing of the following 85 genes: AIP, ALK, APC, ATM, AXIN2,BAP1,  BARD1, BLM, BMPR1A, BRCA1, BRCA2, BRIP1, CASR, CDC73, CDH1, CDK4, CDKN1B, CDKN1C, CDKN2A (p14ARF), CDKN2A (p16INK4a), CEBPA, CHEK2, CTNNA1, DICER1, DIS3L2, EGFR (c.2369C>T, p.Thr790Met variant only), EPCAM (Deletion/duplication testing only), FH, FLCN, GATA2, GPC3, GREM1 (Promoter region deletion/duplication testing only), HOXB13 (c.251G>A, p.Gly84Glu), HRAS, KIT, MAX, MEN1, MET, MITF (c.952G>A, p.Glu318Lys variant only), MLH1, MSH2, MSH3, MSH6, MUTYH, NBN, NF1, NF2, NTHL1, PALB2, PDGFRA, PHOX2B, PMS2, POLD1, POLE, POT1, PRKAR1A, PTCH1, PTEN, RAD50, RAD51C, RAD51D, RB1, RECQL4, RET, RNF43, RUNX1, SDHAF2, SDHA (sequence changes only), SDHB, SDHC, SDHD, SMAD4, SMARCA4, SMARCB1, SMARCE1, STK11, SUFU, TERC, TERT, TMEM127, TP53, TSC1, TSC2, VHL, WRN and WT1.     08/29/2019 Surgery   RIGHT MASTECTOMY WITH SENTINEL LYMPH NODE BIOPSY and RECONSTRUCTION WITH PLACEMENT OF TISSUE EXPANDER AND FLEX HD (ACELLULAR HYDRATED DERMIS) by Dr. TMarlou Starksand Dr DMarla Roe 09/07/19   09/07/2019 Pathology Results   DIAGNOSIS: 09/07/19  A. SENTINEL LYMPH NODE, RIGHT # 1, BIOPSY:  - One lymph node, negative for carcinoma (0/1).   B. SENTINEL LYMPH NODE, RIGHT # 2, BIOPSY:  - One lymph node, negative for carcinoma (0/1).   C. BREAST, RIGHT, MASTECTOMY:  - Invasive ductal carcinoma, 1.8 cm, Nottingham grade 1 of 3.  - Margins of resection are not involved (Closest margin: 20 mm, deep).  - Ductal carcinoma in situ, focal.  - Biopsy site changes.  - See oncology table.    09/07/2019 Oncotype testing   Oncotype RS: 17, meaning there is 5% distant recurrence risk at 9 years with AI/tamoxifen alone. Indicating <1% chemo benefit    10/28/2019 Cancer Staging   Staging form: Breast, AJCC 8th Edition - Pathologic: Stage IA (pT1c, pN0, cM0, G1, ER+, PR+, HER2-, Oncotype DX score: 17) - Signed by FTruitt Merle MD on 10/28/2019    12/31/2019  Pathology Results   FINAL MICROSCOPIC DIAGNOSIS:  A. SKIN, RIGHT BREAST MASTECTOMY FLAP AND SCAR:  - Skin and subcutaneous tissue with fibrosis, mild inflammation and  focal giant cell reaction consistent with scar.  - No evidence of malignancy.    02/24/2020 Survivorship   SCP delivered by LCira Rue NP      CURRENT THERAPY: Anastrozole 1 mg starting 10/28/19  INTERVAL HISTORY: Ms. JTelladoreturns for follow up as scheduled, last seen 04/26/21. Last mammogram was 07/2020, she is overdue. She continues anastrozole,    REVIEW OF SYSTEMS:   Constitutional: Denies fevers, chills or abnormal weight loss Eyes: Denies blurriness of vision Ears, nose, mouth, throat, and face: Denies mucositis or sore throat Respiratory: Denies cough, dyspnea or wheezes Cardiovascular: Denies palpitation, chest discomfort or lower extremity swelling Gastrointestinal:  Denies nausea, heartburn or change in bowel habits Skin: Denies abnormal skin rashes Lymphatics: Denies new lymphadenopathy or easy bruising Neurological:Denies numbness, tingling or new weaknesses Behavioral/Psych: Mood is stable, no new changes  All other systems were reviewed with the patient and are negative.  MEDICAL HISTORY:  Past Medical History:  Diagnosis Date   Anxiety    Arthritis    [place] 02[place]   Right T1c, N0   C. difficile colitis    Colitis, collagenous 09/22/2015   Depression    Essential hypertension    Family history  of brain cancer    Family history of esophageal cancer    Family history of lung cancer    Family history of lymphoma    Family history of pancreatic cancer    Fuchs' corneal dystrophy    GERD (gastroesophageal reflux disease)    Hypertension    Mitral valve prolapse    Mild mitral regurgitation   Mixed hyperlipidemia    Seizures ([place])    Only with dose of haldol; no more since that was stopped.    SURGICAL HISTORY: Past Surgical History:  Procedure Laterality Date    ABDOMINAL HYSTERECTOMY  1999   BACK SURGERY     BIOPSY N/A 09/28/2015   Procedure: BIOPSY;  Surgeon: Danie Binder, MD;  Location: [facility];  Service: Endoscopy;  Laterality: N/A;   BIOPSY  04/05/2016   Procedure: BIOPSY;  Surgeon: Danie Binder, MD;  [facility];  Service: Endoscopy;;  colon bx   BREAST LUMPECTOMY  10/30/2002   right   BREAST RECONSTRUCTION WITH PLACEMENT OF TISSUE EXPANDER AND FLEX HD (ACELLULAR HYDRATED DERMIS) Right 09/07/2019   Procedure: RIGHT BREAST RECONSTRUCTION WITH PLACEMENT OF TISSUE EXPANDER AND FLEX HD (ACELLULAR HYDRATED DERMIS);  Surgeon: Wallace Going, DO;  Location: [facility];  Service: Plastics;  Laterality: Right;   CARDIAC CATHETERIZATION     Carpal tunnel R/L  1992   Cervical spine fusion X2 since last visit     COLONOSCOPY WITH PROPOFOL N/A 09/28/2015   Procedure: COLONOSCOPY WITH PROPOFOL;  Surgeon: Danie Binder, MD;  Location: [facility];  Service: Endoscopy;  Laterality: N/A;  procedure 1 cecum time in 1232  time out 1246   total time 14 mintes   COLONOSCOPY WITH PROPOFOL N/A 04/05/2016   Procedure: COLONOSCOPY WITH PROPOFOL;  Surgeon: Danie Binder, MD;  [facility];  Service: Endoscopy;  Laterality: N/A;  1200   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   DEBRIDEMENT AND CLOSURE WOUND Right 02/17/2020   Procedure: Excision of right breast wound with closure and provena placement;  Surgeon: Wallace Going, DO;  Location: [facility];  Service: Plastics;  Laterality: Right;   ELBOW SURGERY Right  tennis elbow release 2012   ESOPHAGOGASTRODUODENOSCOPY  10/26/2012   Procedure: ESOPHAGOGASTRODUODENOSCOPY (EGD);  Surgeon: Inda Castle, MD;  Location: [facility];  Service: Endoscopy;  Laterality: N/A;   ESOPHAGOGASTRODUODENOSCOPY (EGD) WITH PROPOFOL N/A 09/28/2015   Procedure: ESOPHAGOGASTRODUODENOSCOPY (EGD) WITH PROPOFOL;  Surgeon: Danie Binder, MD;  Location: [facility];  Service: Endoscopy;  Laterality: N/A;    EYE SURGERY  2018   corneal transplants bilateral   KNEE ARTHROSCOPY Left    LEFT HEART CATHETERIZATION WITH CORONARY ANGIOGRAM N/A 10/23/2012   Procedure: LEFT HEART CATHETERIZATION WITH CORONARY ANGIOGRAM;  Surgeon: Peter M Martinique, MD;  Location: [facility] CATH LAB;  Service: Cardiovascular;  Laterality: N/A;   MASTECTOMY Right    MASTECTOMY W/ SENTINEL NODE BIOPSY Right 09/07/2019   Procedure: RIGHT MASTECTOMY WITH SENTINEL LYMPH NODE BIOPSY;  Surgeon: Jovita Kussmaul, MD;  Location: [facility];  Service: General;  Laterality: Right;   OVARIAN CYST REMOVAL     prior to hysterectomy, right   Ray cage fusion  [place] Right 12/31/2019   Procedure: REMOVAL OF TISSUE EXPANDER AND EXCESS SKIN;  Surgeon: Wallace Going, DO;  Location: [facility];  Service: Plastics;  Laterality: Right;  1 hour   SENTINEL LYMPH NODE BIOPSY  10/30/2002  right   TONSILLECTOMY     at 48 months old    I have reviewed the social history and family history with the patient and they are unchanged from previous note.  ALLERGIES:  is allergic to bee venom, haloperidol lactate, meperidine hcl, and xifaxan [rifaximin].  MEDICATIONS:  Current Outpatient Medications  Medication Sig Dispense Refill   acetaminophen (TYLENOL) 500 MG tablet Take 1,000 mg by mouth every 6 (six) hours as needed for moderate pain or headache.     ALPRAZolam (XANAX) 0.5 MG tablet Take 1 tablet (0.5 mg total) by mouth 2 (two) times daily as needed for anxiety. 180 tablet 1   anastrozole (ARIMIDEX) 1 MG tablet Take 1 tablet (1 mg total) by mouth daily. 90 tablet 3   Calcium-Magnesium-Vitamin D (CALCIUM 500 PO) Take 1 tablet by mouth daily.      diclofenac sodium (VOLTAREN) 1 % GEL Apply 1 application topically 3 (three) times daily as needed (pain).      DULoxetine (CYMBALTA) 60 MG capsule Take 1 capsule (60 mg total) by mouth daily. 30 capsule 3   EPINEPHrine (EPI-PEN) 0.3 mg/0.3 mL DEVI Inject 0.3 mg into  the muscle as needed (bee stings).      fish oil-omega-3 fatty acids 1000 MG capsule Take 1 g by mouth every morning.      gabapentin (NEURONTIN) 100 MG capsule TAKE 1 CAPSULE(100 MG) BY MOUTH THREE TIMES DAILY 90 capsule 1   hydrochlorothiazide (MICROZIDE) 12.5 MG capsule TAKE 1 CAPSULE DAILY 90 capsule 3   hydroxychloroquine (PLAQUENIL) 200 MG tablet Take 400 mg by mouth daily.     leflunomide (ARAVA) 20 MG tablet Take 20 mg by mouth daily.     metoprolol succinate (TOPROL-XL) 50 MG 24 hr tablet TAKE 1 TABLET TWICE A DAY  WITH OR IMMEDIATELY        FOLLOWING A MEAL 180 tablet 2   Multiple Vitamin (MULITIVITAMIN WITH MINERALS) TABS Take 1 tablet by mouth every morning.     NIFEdipine (PROCARDIA XL/NIFEDICAL-XL) 90 MG 24 hr tablet TAKE 1 TABLET DAILY 90 tablet 3   nitroGLYCERIN (NITROSTAT) 0.4 MG SL tablet Place 1 tablet (0.4 mg total) under the tongue every 5 (five) minutes x 3 doses as needed for chest pain. 25 tablet 3   pantoprazole (PROTONIX) 40 MG tablet TAKE 1 TABLET DAILY 90 tablet 3   traZODone (DESYREL) 50 MG tablet Take 1 tablet (50 mg total) by mouth at bedtime. 30 tablet 2   Current Facility-Administered Medications  Medication Dose Route Frequency Provider Last Rate Last Admin   betamethasone acetate-betamethasone sodium phosphate (CELESTONE) injection 3 mg  3 mg Intramuscular Once Edrick Kins, DPM        PHYSICAL EXAMINATION: ECOG PERFORMANCE STATUS: {CHL ONC ECOG PS:[phone]}  There were no vitals filed for this visit. There were no vitals filed for this visit.  GENERAL:alert, no distress and comfortable SKIN: skin color, texture, turgor are normal, no rashes or significant lesions EYES: normal, Conjunctiva are pink and non-injected, sclera clear OROPHARYNX:no exudate, no erythema and lips, buccal mucosa, and tongue normal  NECK: supple, thyroid normal size, non-tender, without nodularity LYMPH:  no palpable lymphadenopathy in the cervical, axillary or  inguinal LUNGS: clear to auscultation and percussion with normal breathing effort HEART: regular rate & rhythm and no murmurs and no lower extremity edema ABDOMEN:abdomen soft, non-tender and normal bowel sounds Musculoskeletal:no cyanosis of digits and no clubbing  NEURO: alert & oriented x 3 with fluent speech, no focal motor/sensory  deficits  LABORATORY DATA:  I have reviewed the data as listed CBC Latest Ref Rng & Units 04/26/2021 10/26/2020 04/25/2020  WBC 4.0 - 10.5 K/uL 9.5 9.5 7.7  Hemoglobin 12.0 - 15.0 g/dL 13.8 13.8 13.9  Hematocrit 36.0 - 46.0 % 40.5 40.4 41.7  Platelets 150 - 400 K/uL 417(H) 375 333     CMP Latest Ref Rng & Units 04/26/2021 10/26/2020 04/25/2020  Glucose 70 - 99 mg/dL 114(H) 103(H) 115(H)  BUN 6 - 20 mg/dL 12 14 13   Creatinine 0.44 - 1.00 mg/dL 0.80 0.82 0.83  Sodium 135 - 145 mmol/L 140 139 142  Potassium 3.5 - 5.1 mmol/L 4.0 4.1 3.9  Chloride 98 - 111 mmol/L 103 102 104  CO2 22 - 32 mmol/L 27 25 27   Calcium 8.9 - 10.3 mg/dL 9.8 9.6 9.5  Total Protein 6.5 - 8.1 g/dL 7.7 7.9 7.8  Total Bilirubin 0.3 - 1.2 mg/dL 0.5 0.7 0.7  Alkaline Phos 38 - 126 U/L 106 102 134(H)  AST 15 - 41 U/L 32 23 31  ALT 0 - 44 U/L 25 14 21       RADIOGRAPHIC STUDIES: I have personally reviewed the radiological images as listed and agreed with the findings in the report. No results found.   ASSESSMENT & PLAN:  No problem-specific Assessment & Plan notes found for this encounter.   No orders of the defined types were placed in this encounter.  All questions were answered. The patient knows to call the clinic with any problems, questions or concerns. No barriers to learning was detected. I spent {CHL ONC TIME VISIT - VUDTH:[id number]} counseling the patient face to face. The total time spent in the appointment was {CHL ONC TIME VISIT - KQASU:[id number]} and more than 50% was on counseling and review of test results     Alla Feeling, NP 10/29/21

## 2021-10-30 ENCOUNTER — Telehealth: Payer: Self-pay

## 2021-10-30 ENCOUNTER — Ambulatory Visit: Payer: Commercial Managed Care - PPO | Admitting: Nurse Practitioner

## 2021-10-30 ENCOUNTER — Other Ambulatory Visit: Payer: Commercial Managed Care - PPO

## 2021-10-30 NOTE — Telephone Encounter (Signed)
Spoke with pt via telephone regarding her appt today with Cira Rue, NP.  Lacie will not be in the office today; therefore, Dr. Burr Medico is requesting that the pt be rescheduled.  Pt verbalized agreement.  Transferred to [facility] Scheduling team to reschedule her appts for today.

## 2021-11-23 ENCOUNTER — Other Ambulatory Visit: Payer: Self-pay

## 2021-11-23 ENCOUNTER — Inpatient Hospital Stay: Payer: Commercial Managed Care - PPO | Admitting: Hematology

## 2021-11-23 ENCOUNTER — Inpatient Hospital Stay: Payer: Commercial Managed Care - PPO | Attending: Hematology

## 2021-11-23 VITALS — BP 145/79 | HR 91 | Temp 98.6°F | Resp 18 | Ht 63.0 in | Wt 173.9 lb

## 2021-11-23 DIAGNOSIS — C50411 Malignant neoplasm of upper-outer quadrant of right female breast: Secondary | ICD-10-CM | POA: Diagnosis not present

## 2021-11-23 DIAGNOSIS — Z79811 Long term (current) use of aromatase inhibitors: Secondary | ICD-10-CM | POA: Diagnosis not present

## 2021-11-23 DIAGNOSIS — F17210 Nicotine dependence, cigarettes, uncomplicated: Secondary | ICD-10-CM | POA: Insufficient documentation

## 2021-11-23 DIAGNOSIS — Z79899 Other long term (current) drug therapy: Secondary | ICD-10-CM | POA: Diagnosis not present

## 2021-11-23 DIAGNOSIS — M85852 Other specified disorders of bone density and structure, left thigh: Secondary | ICD-10-CM | POA: Diagnosis not present

## 2021-11-23 DIAGNOSIS — Z170 Estrogen receptor positive status [ER+]: Secondary | ICD-10-CM | POA: Insufficient documentation

## 2021-11-23 LAB — CBC WITH DIFFERENTIAL (CANCER CENTER ONLY)
Abs Immature Granulocytes: 0.04 10*3/uL (ref 0.00–0.07)
Basophils Absolute: 0.1 10*3/uL (ref 0.0–0.1)
Basophils Relative: 1 %
Eosinophils Absolute: 0.4 10*3/uL (ref 0.0–0.5)
Eosinophils Relative: 3 %
HCT: 39.8 % (ref 36.0–46.0)
Hemoglobin: 13.4 g/dL (ref 12.0–15.0)
Immature Granulocytes: 0 %
Lymphocytes Relative: 35 %
Lymphs Abs: 3.8 10*3/uL (ref 0.7–4.0)
MCH: 28.7 pg (ref 26.0–34.0)
MCHC: 33.7 g/dL (ref 30.0–36.0)
MCV: 85.2 fL (ref 80.0–100.0)
Monocytes Absolute: 0.8 10*3/uL (ref 0.1–1.0)
Monocytes Relative: 7 %
Neutro Abs: 5.6 10*3/uL (ref 1.7–7.7)
Neutrophils Relative %: 54 %
Platelet Count: 347 10*3/uL (ref 150–400)
RBC: 4.67 MIL/uL (ref 3.87–5.11)
RDW: 13.5 % (ref 11.5–15.5)
WBC Count: 10.7 10*3/uL — ABNORMAL HIGH (ref 4.0–10.5)
nRBC: 0 % (ref 0.0–0.2)

## 2021-11-23 LAB — CMP (CANCER CENTER ONLY)
ALT: 20 U/L (ref 0–44)
AST: 25 U/L (ref 15–41)
Albumin: 3.9 g/dL (ref 3.5–5.0)
Alkaline Phosphatase: 106 U/L (ref 38–126)
Anion gap: 11 (ref 5–15)
BUN: 12 mg/dL (ref 6–20)
CO2: 26 mmol/L (ref 22–32)
Calcium: 9.3 mg/dL (ref 8.9–10.3)
Chloride: 103 mmol/L (ref 98–111)
Creatinine: 0.77 mg/dL (ref 0.44–1.00)
GFR, Estimated: >60 mL/min (ref >60)
Glucose, Bld: 136 mg/dL — ABNORMAL HIGH (ref 70–99)
Potassium: 3.7 mmol/L (ref 3.5–5.1)
Sodium: 140 mmol/L (ref 135–145)
Total Bilirubin: 0.6 mg/dL (ref 0.3–1.2)
Total Protein: 7.6 g/dL (ref 6.5–8.1)

## 2021-11-23 MED ORDER — TAMOXIFEN CITRATE 20 MG PO TABS
20.0000 mg | ORAL_TABLET | Freq: Every day | ORAL | 1 refills | Status: DC
Start: 2021-11-23 — End: 2021-11-24

## 2021-11-23 NOTE — Progress Notes (Signed)
[place]   [phone] [phone]   Clinic Follow up Note   Patient Care Team: Janie Morning, DO as PCP - General (Family Medicine) Satira Sark, MD as PCP - Cardiology (Cardiology) Lendon Colonel, NP as Nurse Practitioner (Nurse Practitioner) Mauro Kaufmann, RN as Oncology Nurse Navigator Rockwell Germany, RN as Oncology Nurse Navigator Truitt Merle, MD as Consulting Physician (Hematology) Jovita Kussmaul, MD as Consulting Physician (General Surgery) Gery Pray, MD as Consulting Physician (Radiation Oncology) Lahoma Rocker, MD as Consulting Physician (Rheumatology) Alla Feeling, NP as Nurse Practitioner (Nurse Practitioner)  Date of Service:  11/23/2021  CHIEF COMPLAINT: f/u of right breast cancer  CURRENT THERAPY:  Anastrozole 4m daily starting 10/28/2019  ASSESSMENT & PLAN:  Rachel SANDIFORDis a 60y.o. female with   1. Malignant neoplasm of upper-outer quadrant of right breast, Stage IA, pT1cN0M0, ER+/PP+, HER2-, Grade 1 -She was diagnosed in 08/2019. Given prior right lumpectomy and RT she underwent right mastectomy on 09/07/19. She has a low risk RS of 17 and adjuvant chemotherapy was not recommended. -She has had mastectomy, no adjuvant radiation is needed due to the early stage disease. She underwent right breast reconstruction on 17/06/23with wound complications afterward.  -She started anastrozole before surgery on 10/28/19. Will continue for 5-7 years. She is tolerating well with mood swings.  -she has been having worsening arthritis pain recently. I discussed switching her to tamoxifen. --The potential side effects, which includes but not limited to, hot flash, skin and vaginal dryness, slightly increased risk of cardiovascular disease and cataract, small risk of thrombosis, were discussed with her in great details. Preventive strategies for thrombosis, such as being physically active, using compression stocks, avoid  cigarette smoking, etc., were reviewed with her. She voiced good understanding, and agrees to proceed.  -from a breast cancer standpoint, she is clinically doing well. Lab reviewed, overall stable. Her physical exam was unremarkable. There is no clinical concern for recurrence. -Continue Surveillance. Next mammogram scheduled for 12/20/21.  -switch to tamoxifen -F/u in 6 months with NP Lacie      2. H/o Right breast ductal carcinoma, pT1cN0M0 stage IA, ER-/PR-, HER2 +, Grade 3, Genetic testing was negative for pathogenetic mutations.  -Diagnosed on 10/16/02 managed by Dr. PEston Esters -Treated with right lumpectomy and SNLB, Adjuvant chemo AC-Docetaxol/Herceptin and RT   3. Osteopenia  -Her 11/2012 DEXA was normal.  -07/2020 DEXA showed osteopenia with lowest T-score -1.1 at left hip. Continue calcium and Vitd, I encouraged her to exercise  -Repeat DEXA in 07/2022, should improve on tamoxifen.   4. Rheumatoid arthritis, diagnosed in 2018.  -She is seen by Rheumatologist Dr. AKathlene November-She was previously on low dose weekly methotrexate and Cimzia self-injections q2weeks. She stopped after breast cancer diagnosis in 08/2019 -she experienced worsening stiffness/pain in her hands and new meds added recently    5. H/o Mitral Valve, HTN, GERD, Smoking Cessation -Had cardiac cath in 2013, no stent placed.  -She used to smoke 2 ppd. She has a 30+ year history. She continues to smoke some. She has been counseled on smoking cessation.    6. Anxiety -She was on Paxil before and currently on Xanax 0.555m-she is followed [facility]. -she notes she was prescribed Cymbalta but does not plan to start this if she doesn't have to.     PLAN:  -switch to tamoxifen due to arthralgia  -mammogram 12/20/21 -Lab and f/u in  6 months with NP Lacie   No problem-specific Assessment & Plan notes found for this encounter.   SUMMARY OF ONCOLOGIC HISTORY: Oncology History  Malignant neoplasm of  upper-outer quadrant of right breast in female, estrogen receptor positive ([place] Sutton)  07/01/2019 Mammogram   Diagnostic mammogram 07/01/19  IMPRESSION: Suspicious mass in the 9:30 region of the right breast 9 cm from the nipple measuring 1.3 x 1.3 x 1.1 cm. Suspicious mass in the right axilla measuring 5 x 4 x 4 mm.   07/02/2019 Cancer Staging   Staging form: Breast, AJCC 8th Edition - Clinical stage from 07/02/2019: Stage IA (cT1c, cN0, cM0, G2, ER+, PR+, HER2: Equivocal) - Signed by Truitt Merle, MD on 07/07/2019    07/02/2019 Initial Biopsy   Diagnosis 07/02/19 1. Breast, right, needle core biopsy, 9:30 o'clock - INVASIVE DUCTAL CARCINOMA, GRADE 1-2. SEE NOTE - DUCTAL CARCINOMA IN SITU, INTERMEDIATE GRADE 2. Lymph node, needle/core biopsy, right axilla - LYMPH NODE, NEGATIVE FOR CARCINOMA   07/02/2019 Receptors her2   By immunohistochemistry, the tumor cells are EQUIVOCAL for Her2 (2+). HER2 by [place] will be PERFORMED and the RESULTS REPORTED SEPARATELY Estrogen Receptor: 100%, POSITIVE, STRONG STAINING INTENSITY Progesterone Receptor: 90%, POSITIVE, STRONG STAINING INTENSITY Proliferation Marker Ki67: 10%   07/06/2019 Initial Diagnosis   Malignant neoplasm of upper-outer quadrant of right breast in female, estrogen receptor positive (Rachel Sutton)   07/08/2019 -  Anti-estrogen oral therapy   Anastrozole 26m once daily starting 07/08/19 before surgery.    07/20/2019 Genetic Testing   Negative genetic testing on the [facility] Multi-Cancer Panel. The report date is 07/20/2019.  The Multi-Gene Panel offered by [facility] includes sequencing and/or deletion duplication testing of the following 85 genes: AIP, ALK, APC, ATM, AXIN2,BAP1,  BARD1, BLM, BMPR1A, BRCA1, BRCA2, BRIP1, CASR, CDC73, CDH1, CDK4, CDKN1B, CDKN1C, CDKN2A (p14ARF), CDKN2A (p16INK4a), CEBPA, CHEK2, CTNNA1, DICER1, DIS3L2, EGFR (c.2369C>T, p.Thr790Met variant only), EPCAM (Deletion/duplication testing only), FH, FLCN, GATA2, GPC3, GREM1 (Promoter  region deletion/duplication testing only), HOXB13 (c.251G>A, p.Gly84Glu), HRAS, KIT, MAX, MEN1, MET, MITF (c.952G>A, p.Glu318Lys variant only), MLH1, MSH2, MSH3, MSH6, MUTYH, NBN, NF1, NF2, NTHL1, PALB2, PDGFRA, PHOX2B, PMS2, POLD1, POLE, POT1, PRKAR1A, PTCH1, PTEN, RAD50, RAD51C, RAD51D, RB1, RECQL4, RET, RNF43, RUNX1, SDHAF2, SDHA (sequence changes only), SDHB, SDHC, SDHD, SMAD4, SMARCA4, SMARCB1, SMARCE1, STK11, SUFU, TERC, TERT, TMEM127, TP53, TSC1, TSC2, VHL, WRN and WT1.     08/29/2019 Surgery   RIGHT MASTECTOMY WITH SENTINEL LYMPH NODE BIOPSY and RECONSTRUCTION WITH PLACEMENT OF TISSUE EXPANDER AND FLEX HD (ACELLULAR HYDRATED DERMIS) by Dr. TMarlou Starksand Dr DMarla Roe 09/07/19   09/07/2019 Pathology Results   DIAGNOSIS: 09/07/19  A. SENTINEL LYMPH NODE, RIGHT # 1, BIOPSY:  - One lymph node, negative for carcinoma (0/1).   B. SENTINEL LYMPH NODE, RIGHT # 2, BIOPSY:  - One lymph node, negative for carcinoma (0/1).   C. BREAST, RIGHT, MASTECTOMY:  - Invasive ductal carcinoma, 1.8 cm, Nottingham grade 1 of 3.  - Margins of resection are not involved (Closest margin: 20 mm, deep).  - Ductal carcinoma in situ, focal.  - Biopsy site changes.  - See oncology table.    09/07/2019 Oncotype testing   Oncotype RS: 17, meaning there is 5% distant recurrence risk at 9 years with AI/tamoxifen alone. Indicating <1% chemo benefit    10/28/2019 Cancer Staging   Staging form: Breast, AJCC 8th Edition - Pathologic: Stage IA (pT1c, pN0, cM0, G1, ER+, PR+, HER2-, Oncotype DX score: 17) - Signed by FTruitt Merle MD on 10/28/2019  12/31/2019 Pathology Results   FINAL MICROSCOPIC DIAGNOSIS:  A. SKIN, RIGHT BREAST MASTECTOMY FLAP AND SCAR:  - Skin and subcutaneous tissue with fibrosis, mild inflammation and  focal giant cell reaction consistent with scar.  - No evidence of malignancy.    02/24/2020 Survivorship   SCP delivered by Cira Rue, NP       INTERVAL HISTORY:  ELBA SCHABER is here for a  follow up of breast cancer. She was last seen by me on 04/26/21. She presents to the clinic alone. She reports she has been having worsening pain and stiffness in her hands.   All other systems were reviewed with the patient and are negative.  MEDICAL HISTORY:  Past Medical History:  Diagnosis Date   Anxiety    Arthritis    Breast cancer ([place]) 02/22/2012   Right T1c, N0   C. difficile colitis    Colitis, collagenous 09/22/2015   Depression    Essential hypertension    Family history of brain cancer    Family history of esophageal cancer    Family history of lung cancer    Family history of lymphoma    Family history of pancreatic cancer    Fuchs' corneal dystrophy    GERD (gastroesophageal reflux disease)    Hypertension    Mitral valve prolapse    Mild mitral regurgitation   Mixed hyperlipidemia    Seizures ([place])    Only with dose of haldol; no more since that was stopped.    SURGICAL HISTORY: Past Surgical History:  Procedure Laterality Date   ABDOMINAL HYSTERECTOMY  1999   BACK SURGERY     BIOPSY N/A 09/28/2015   Procedure: BIOPSY;  Surgeon: Danie Binder, MD;  Location: [facility];  Service: Endoscopy;  Laterality: N/A;   BIOPSY  04/05/2016   Procedure: BIOPSY;  Surgeon: Danie Binder, MD;  Location: [facility];  Service: Endoscopy;;  colon bx   BREAST LUMPECTOMY  10/30/2002   right   BREAST RECONSTRUCTION WITH PLACEMENT OF TISSUE EXPANDER AND FLEX HD (ACELLULAR HYDRATED DERMIS) Right 09/07/2019   Procedure: RIGHT BREAST RECONSTRUCTION WITH PLACEMENT OF TISSUE EXPANDER AND FLEX HD (ACELLULAR HYDRATED DERMIS);  Surgeon: Wallace Going, DO;  Location: [place];  Service: Plastics;  Laterality: Right;   CARDIAC CATHETERIZATION     Carpal tunnel R/L  1992   Cervical spine fusion X2 since last visit     COLONOSCOPY WITH PROPOFOL N/A 09/28/2015   Procedure: COLONOSCOPY WITH PROPOFOL;  Surgeon: Danie Binder, MD;  Location: [facility];  Service: Endoscopy;  Laterality: N/A;   procedure 1 cecum time in 1232  time out 1246   total time 14 mintes   COLONOSCOPY WITH PROPOFOL N/A 04/05/2016   Procedure: COLONOSCOPY WITH PROPOFOL;  Surgeon: Danie Binder, MD;  [facility];  Service: Endoscopy;  Laterality: N/A;  1200   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   DEBRIDEMENT AND CLOSURE WOUND Right 02/17/2020   Procedure: Excision of right breast wound with closure and provena placement;  Surgeon: Wallace Going, DO;  Location: [place];  Service: Plastics;  Laterality: Right;   ELBOW SURGERY Right  tennis elbow release 2012   ESOPHAGOGASTRODUODENOSCOPY  10/26/2012   Procedure: ESOPHAGOGASTRODUODENOSCOPY (EGD);  Surgeon: Inda Castle, MD;  Location: [place];  Service: Endoscopy;  Laterality: N/A;   ESOPHAGOGASTRODUODENOSCOPY (EGD) WITH PROPOFOL N/A 09/28/2015   Procedure: ESOPHAGOGASTRODUODENOSCOPY (EGD) WITH PROPOFOL;  Surgeon: Danie Binder, MD;  Location: [facility];  Service: Endoscopy;  Laterality: N/A;   EYE SURGERY  2018   corneal transplants bilateral   KNEE ARTHROSCOPY Left    LEFT HEART CATHETERIZATION WITH CORONARY ANGIOGRAM N/A 10/23/2012   Procedure: LEFT HEART CATHETERIZATION WITH CORONARY ANGIOGRAM;  Surgeon: Peter M Martinique, MD;  [facility] C[facility];  Service: Cardiovascular;  Laterality: N/A;   MASTECTOMY Right    MASTECTOMY W/ SENTINEL NODE BIOPSY Right 09/07/2019   Procedure: RIGHT MASTECTOMY WITH SENTINEL LYMPH NODE BIOPSY;  Surgeon: Jovita Kussmaul, MD;  Location: [place];  Service: General;  Laterality: Right;   OVARIAN CYST REMOVAL     prior to hysterectomy, right   Ray cage fusion  [place] Right 12/31/2019   Procedure: REMOVAL OF TISSUE EXPANDER AND EXCESS SKIN;  Surgeon: Wallace Going, DO;  Location: [place];  Service: Plastics;  Laterality: Right;  1 hour   SENTINEL LYMPH NODE BIOPSY  10/30/2002   right   TONSILLECTOMY     at six months old    I have  reviewed the social history and family history with the patient and they are unchanged from previous note.  ALLERGIES:  is allergic to bee venom, haloperidol lactate, meperidine hcl, and xifaxan [rifaximin].  MEDICATIONS:  Current Outpatient Medications  Medication Sig Dispense Refill   tamoxifen (NOLVADEX) 20 MG tablet Take 1 tablet (20 mg total) by mouth daily. 90 tablet 1   acetaminophen (TYLENOL) 500 MG tablet Take 1,000 mg by mouth every 6 (six) hours as needed for moderate pain or headache.     ALPRAZolam (XANAX) 0.5 MG tablet Take 1 tablet (0.5 mg total) by mouth 2 (two) times daily as needed for anxiety. 180 tablet 1   anastrozole (ARIMIDEX) 1 MG tablet Take 1 tablet (1 mg total) by mouth daily. 90 tablet 3   Calcium-Magnesium-Vitamin D (CALCIUM 500 PO) Take 1 tablet by mouth daily.      diclofenac sodium (VOLTAREN) 1 % GEL Apply 1 application topically 3 (three) times daily as needed (pain).      EPINEPHrine (EPI-PEN) 0.3 mg/0.3 mL DEVI Inject 0.3 mg into the muscle as needed (bee stings).      fish oil-omega-3 fatty acids 1000 MG capsule Take 1 g by mouth every morning.      gabapentin (NEURONTIN) 100 MG capsule TAKE 1 CAPSULE(100 MG) BY MOUTH THREE TIMES DAILY 90 capsule 1   hydrochlorothiazide (MICROZIDE) 12.5 MG capsule TAKE 1 CAPSULE DAILY 90 capsule 3   hydroxychloroquine (PLAQUENIL) 200 MG tablet Take 400 mg by mouth daily.     leflunomide (ARAVA) 20 MG tablet Take 20 mg by mouth daily.     metoprolol succinate (TOPROL-XL) 50 MG 24 hr tablet TAKE 1 TABLET TWICE A DAY  WITH OR IMMEDIATELY        FOLLOWING A MEAL 180 tablet 2   Multiple Vitamin (MULITIVITAMIN WITH MINERALS) TABS Take 1 tablet by mouth every morning.     NIFEdipine (PROCARDIA XL/NIFEDICAL-XL) 90 MG 24 hr tablet TAKE 1 TABLET DAILY 90 tablet 3   nitroGLYCERIN (NITROSTAT) 0.4 MG SL tablet Place 1 tablet (0.4 mg total) under the tongue every 5 (five) minutes x 3 doses as needed for chest pain. 25 tablet 3    pantoprazole (PROTONIX) 40 MG tablet TAKE 1 TABLET DAILY 90 tablet 3   traZODone (DESYREL) 50 MG tablet Take 1 tablet (50 mg total) by mouth at bedtime. 30 tablet 2   Current Facility-Administered  Medications  Medication Dose Route Frequency Provider Last Rate Last Admin   betamethasone acetate-betamethasone sodium phosphate (CELESTONE) injection 3 mg  3 mg Intramuscular Once Edrick Kins, DPM        PHYSICAL EXAMINATION: ECOG PERFORMANCE STATUS: 1 - Symptomatic but completely ambulatory  Vitals:   11/23/21 1148  BP: (!) 145/79  Pulse: 91  Resp: 18  Temp: 98.6 F (37 C)  SpO2: 97%   Wt Readings from Last 3 Encounters:  11/23/21 78.9 kg  07/04/21 70 kg  04/26/21 82.2 kg     GENERAL:alert, no distress and comfortable SKIN: skin color, texture, turgor are normal, no rashes or significant lesions EYES: normal, Conjunctiva are pink and non-injected, sclera clear  NECK: supple, thyroid normal size, non-tender, without nodularity LYMPH:  no palpable lymphadenopathy in the cervical, axillary  LUNGS: clear to auscultation and percussion with normal breathing effort HEART: regular rate & rhythm and no murmurs and no lower extremity edema ABDOMEN:abdomen soft, non-tender and normal bowel sounds Musculoskeletal:no cyanosis of digits and no clubbing  NEURO: alert & oriented x 3 with fluent speech, no focal motor/sensory deficits BREAST: No palpable mass, nodules or adenopathy bilaterally. Breast exam benign.   LABORATORY DATA:  I have reviewed the data as listed CBC Latest Ref Rng & Units 11/23/2021 04/26/2021 10/26/2020  WBC 4.0 - 10.5 K/uL 10.7(H) 9.5 9.5  Hemoglobin 12.0 - 15.0 g/dL 13.4 13.8 13.8  Hematocrit 36.0 - 46.0 % 39.8 40.5 40.4  Platelets 150 - 400 K/uL 347 417(H) 375     CMP Latest Ref Rng & Units 11/23/2021 04/26/2021 10/26/2020  Glucose 70 - 99 mg/dL 136(H) 114(H) 103(H)  BUN 6 - 20 mg/dL _0 Creatinine 0.44 - 1.00 mg/dL 0.77 0.80 0.82  Sodium 135 - 145  mmol/L 140 140 139  Potassium 3.5 - 5.1 mmol/L 3.7 4.0 4.1  Chloride 98 - 111 mmol/L 103 103 102  CO2 22 - 32 mmol/L _1 Calcium 8.9 - 10.3 mg/dL 9.3 9.8 9.6  Total Protein 6.5 - 8.1 g/dL 7.6 7.7 7.9  Total Bilirubin 0.3 - 1.2 mg/dL 0.6 0.5 0.7  Alkaline Phos 38 - 126 U/L 106 106 102  AST 15 - 41 U/L 25 32 23  ALT 0 - 44 U/L _2 RADIOGRAPHIC STUDIES: I have personally reviewed the radiological images as listed and agreed with the findings in the report. No results found.    No orders of the defined types were placed in this encounter.  All questions were answered. The patient knows to call the clinic with any problems, questions or concerns. No barriers to learning was detected. The total time spent in the appointment was 30 minutes.     Truitt Merle, MD 11/23/2021   I, Wilburn Mylar, am acting as scribe for Truitt Merle, MD.   I have reviewed the above documentation for accuracy and completeness, and I agree with the above.

## 2021-11-24 ENCOUNTER — Other Ambulatory Visit: Payer: Self-pay | Admitting: Hematology

## 2021-11-27 ENCOUNTER — Other Ambulatory Visit: Payer: Self-pay

## 2021-12-07 ENCOUNTER — Telehealth (INDEPENDENT_AMBULATORY_CARE_PROVIDER_SITE_OTHER): Payer: Commercial Managed Care - PPO | Admitting: Psychiatry

## 2021-12-07 ENCOUNTER — Encounter (HOSPITAL_COMMUNITY): Payer: Self-pay | Admitting: Psychiatry

## 2021-12-07 ENCOUNTER — Other Ambulatory Visit: Payer: Self-pay

## 2021-12-07 DIAGNOSIS — F322 Major depressive disorder, single episode, severe without psychotic features: Secondary | ICD-10-CM | POA: Diagnosis not present

## 2021-12-07 MED ORDER — ALPRAZOLAM 0.5 MG PO TABS
0.5000 mg | ORAL_TABLET | Freq: Two times a day (BID) | ORAL | 1 refills | Status: DC | PRN
Start: 2021-12-07 — End: 2022-06-07

## 2021-12-07 NOTE — Progress Notes (Signed)
Virtual Visit via Video Note  I connected with Rachel Sutton on 12/07/21 at  9:40 AM EST by a video enabled telemedicine application and verified that I am speaking with the correct person using two identifiers.  Location: Patient: home Provider: office   I discussed the limitations of evaluation and management by telemedicine and the availability of in person appointments. The patient expressed understanding and agreed to proceed.     I discussed the assessment and treatment plan with the patient. The patient was provided an opportunity to ask questions and all were answered. The patient agreed with the plan and demonstrated an understanding of the instructions.   The patient was advised to call back or seek an in-person evaluation if the symptoms worsen or if the condition fails to improve as anticipated.  I provided 15 minutes of non-face-to-face time during this encounter.   Levonne Spiller, MD  [facility] MD/PA/NP OP Progress Note  12/07/2021 9:54 AM Rachel Sutton  MRN:  [id number]  Chief Complaint:  Chief Complaint   Depression; Anxiety; Follow-up    HPI: This patient is a 60 year old married white female who is currently living in [place].  She works as an Optometrist for [employer]  Patient returns for follow-up after 3 months.  She continues to do well.  She states that she had COVID for a second time in October.  She had already had it once back in August.  She states her immune system is down because of the medication she takes for rheumatoid arthritis.  She just got her booster shots with perhaps this will help prevent recurrence.  In terms of mood the patient is doing very well.  She denies any thoughts of self-harm or suicide her mood has been good and she is eating and sleeping well.  She denies significant anxiety and thinks the Xanax is really helped with this as well as helped her sleep. Visit Diagnosis:    ICD-10-CM   1. Major depressive disorder, single  episode, severe without psychotic features ([place])  F32.2       Past Psychiatric History: none  Past Medical History:  Past Medical History:  Diagnosis Date   Anxiety    Arthritis    Breast cancer ([place]) 02/22/2012   Right T1c, N0   C. difficile colitis    Colitis, collagenous 09/22/2015   Depression    Essential hypertension    Family history of brain cancer    Family history of esophageal cancer    Family history of lung cancer    Family history of lymphoma    Family history of pancreatic cancer    Fuchs' corneal dystrophy    GERD (gastroesophageal reflux disease)    Hypertension    Mitral valve prolapse    Mild mitral regurgitation   Mixed hyperlipidemia    Seizures ([place])    Only with dose of haldol; no more since that was stopped.    Past Surgical History:  Procedure Laterality Date   ABDOMINAL HYSTERECTOMY  1999   BACK SURGERY     BIOPSY N/A 09/28/2015   Procedure: BIOPSY;  Surgeon: Danie Binder, MD;  Location: [facility];  Service: Endoscopy;  Laterality: N/A;   BIOPSY  04/05/2016   Procedure: BIOPSY;  Surgeon: Danie Binder, MD;  [facility];  Service: Endoscopy;;  colon bx   BREAST LUMPECTOMY  10/30/2002   right   BREAST RECONSTRUCTION WITH PLACEMENT OF TISSUE EXPANDER AND FLEX HD (ACELLULAR HYDRATED DERMIS) Right 09/07/2019  Procedure: RIGHT BREAST RECONSTRUCTION WITH PLACEMENT OF TISSUE EXPANDER AND FLEX HD (ACELLULAR HYDRATED DERMIS);  Surgeon: Wallace Going, DO;  Location: [place];  Service: Plastics;  Laterality: Right;   CARDIAC CATHETERIZATION     Carpal tunnel R/L  1992   Cervical spine fusion X2 since last visit     COLONOSCOPY WITH PROPOFOL N/A 09/28/2015   Procedure: COLONOSCOPY WITH PROPOFOL;  Surgeon: Danie Binder, MD;  Location: [facility];  Service: Endoscopy;  Laterality: N/A;  procedure 1 cecum time in 1232  time out 1246   total time 14 mintes   COLONOSCOPY WITH PROPOFOL N/A 04/05/2016   Procedure: COLONOSCOPY WITH PROPOFOL;  Surgeon:  Danie Binder, MD;  [facility];  Service: Endoscopy;  Laterality: N/A;  1200   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   DEBRIDEMENT AND CLOSURE WOUND Right 02/17/2020   Procedure: Excision of right breast wound with closure and provena placement;  Surgeon: Wallace Going, DO;  Location: [place];  Service: Plastics;  Laterality: Right;   ELBOW SURGERY Right  tennis elbow release 2012   ESOPHAGOGASTRODUODENOSCOPY  10/26/2012   Procedure: ESOPHAGOGASTRODUODENOSCOPY (EGD);  Surgeon: Inda Castle, MD;  Location: [place];  Service: Endoscopy;  Laterality: N/A;   ESOPHAGOGASTRODUODENOSCOPY (EGD) WITH PROPOFOL N/A 09/28/2015   Procedure: ESOPHAGOGASTRODUODENOSCOPY (EGD) WITH PROPOFOL;  Surgeon: Danie Binder, MD;  Location[facility];  Service: Endoscopy;  Laterality: N/A;   EYE SURGERY  2018   corneal transplants bilateral   KNEE ARTHROSCOPY Left    LEFT HEART CATHETERIZATION WITH CORONARY ANGIOGRAM N/A 10/23/2012   Procedure: LEFT HEART CATHETERIZATION WITH CORONARY ANGIOGRAM;  Surgeon: Peter M Martinique, MD;  Location: [facility] C[facility];  Service: Cardiovascular;  Laterality: N/A;   MASTECTOMY Right    MASTECTOMY W/ SENTINEL NODE BIOPSY Right 09/07/2019   Procedure: RIGHT MASTECTOMY WITH SENTINEL LYMPH NODE BIOPSY;  Surgeon: Jovita Kussmaul, MD;  Location: [place];  Service: General;  Laterality: Right;   OVARIAN CYST REMOVAL     prior to hysterectomy, right   Ray cage fusion  [place] Right 12/31/2019   Procedure: REMOVAL OF TISSUE EXPANDER AND EXCESS SKIN;  Surgeon: Wallace Going, DO;  Location: [place];  Service: Plastics;  Laterality: Right;  1 hour   SENTINEL LYMPH NODE BIOPSY  10/30/2002   right   TONSILLECTOMY     at 30 old    Family Psychiatric History: see below  Family History:  Family History  Problem Relation Age of Onset   Diabetes Mother    Pancreatic cancer Mother 3   Lung cancer Father     Diabetes Father    Heart disease Father    Esophageal cancer Father 74       Beer drinker   Brain cancer Brother 93   Brain cancer Maternal Grandfather    Heart disease Paternal Grandfather    Cancer Maternal Uncle        unknown form of cancer; cigar smoker   Lung cancer Maternal Grandmother    Brain cancer Cousin        dx in his late 20s-40s   Schizophrenia Sister    Lymphoma Niece 6   Anesthesia problems Neg Hx    Hypotension Neg Hx    Malignant hyperthermia Neg Hx    Pseudochol deficiency Neg Hx    Colon cancer Neg Hx    Colon polyps Neg Hx  Crohn's disease Neg Hx    Ulcerative colitis Neg Hx     Social History:  Social History   Socioeconomic History   Marital status: Married    Spouse name: Not on file   Number of children: 2   Years of education: Not on file   Highest education level: Not on file  Occupational History   Occupation: Aeronautical engineer: [employer]    Comment: [employer]  Tobacco Use   Smoking status: Former    Packs/day: 0.50    Years: 35.00    Pack years: 17.50    Types: Cigarettes    Start date: 07/07/1974    Quit date: 07/14/2019    Years since quitting: 2.4   Smokeless tobacco: Never  Vaping Use   Vaping Use: Never used  Substance and Sexual Activity   Alcohol use: Not Currently    Alcohol/week: 0.0 standard drinks    Comment: stopped July 26, 2014)   Drug use: No   Sexual activity: Not Currently    Birth control/protection: Surgical  Other Topics Concern   Not on file  Social History Narrative   Not on file   Social Determinants of Health   Financial Resource Strain: Not on file  Food Insecurity: Not on file  Transportation Needs: Not on file  Physical Activity: Not on file  Stress: Not on file  Social Connections: Not on file    Allergies:  Allergies  Allergen Reactions   Bee Venom Anaphylaxis   Haloperidol Lactate Other (See Comments)    Convulsions    Meperidine Hcl Hives   Xifaxan [Rifaximin] Diarrhea  and Other (See Comments)    Constantly felt the need to use the bathroom     Metabolic Disorder Labs: No results found for: HGBA1C, MPG No results found for: PROLACTIN Lab Results  Component Value Date   CHOL 207 (H) 10/23/2012   TRIG 319 (H) 10/23/2012   HDL 33 (L) 10/23/2012   CHOLHDL 6.3 10/23/2012   VLDL 64 (H) 10/23/2012   LDLCALC 110 (H) 10/23/2012   Lab Results  Component Value Date   TSH 0.706 09/22/2015    Therapeutic Level Labs: No results found for: LITHIUM No results found for: VALPROATE No components found for:  CBMZ  Current Medications: Current Outpatient Medications  Medication Sig Dispense Refill   acetaminophen (TYLENOL) 500 MG tablet Take 1,000 mg by mouth every 6 (six) hours as needed for moderate pain or headache.     ALPRAZolam (XANAX) 0.5 MG tablet Take 1 tablet (0.5 mg total) by mouth 2 (two) times daily as needed for anxiety. 180 tablet 1   calcium carbonate (OS-CAL) 1250 (500 Ca) MG chewable tablet Chew 1 tablet by mouth daily.     Calcium-Magnesium-Vitamin D (CALCIUM 500 PO) Take 1 tablet by mouth daily.      diclofenac sodium (VOLTAREN) 1 % GEL Apply 1 application topically 3 (three) times daily as needed (pain).      EPINEPHrine (EPI-PEN) 0.3 mg/0.3 mL DEVI Inject 0.3 mg into the muscle as needed (bee stings).      fish oil-omega-3 fatty acids 1000 MG capsule Take 1 g by mouth every morning.      gabapentin (NEURONTIN) 100 MG capsule TAKE 1 CAPSULE(100 MG) BY MOUTH THREE TIMES DAILY 90 capsule 1   hydrochlorothiazide (MICROZIDE) 12.5 MG capsule TAKE 1 CAPSULE DAILY 90 capsule 3   hydroxychloroquine (PLAQUENIL) 200 MG tablet Take 400 mg by mouth daily.     leflunomide (ARAVA) 20  MG tablet Take 20 mg by mouth daily.     metoprolol succinate (TOPROL-XL) 50 MG 24 hr tablet TAKE 1 TABLET TWICE A DAY  WITH OR IMMEDIATELY        FOLLOWING A MEAL 180 tablet 2   Multiple Vitamin (MULITIVITAMIN WITH MINERALS) TABS Take 1 tablet by mouth every morning.      NIFEdipine (PROCARDIA XL/NIFEDICAL-XL) 90 MG 24 hr tablet TAKE 1 TABLET DAILY 90 tablet 3   nitroGLYCERIN (NITROSTAT) 0.4 MG SL tablet Place 1 tablet (0.4 mg total) under the tongue every 5 (five) minutes x 3 doses as needed for chest pain. 25 tablet 3   pantoprazole (PROTONIX) 40 MG tablet TAKE 1 TABLET DAILY 90 tablet 3   sulfaSALAzine (AZULFIDINE) 500 MG tablet Take 500 mg by mouth daily.     tamoxifen (NOLVADEX) 20 MG tablet TAKE 1 TABLET DAILY. 30 tablet 1   Current Facility-Administered Medications  Medication Dose Route Frequency Provider Last Rate Last Admin   betamethasone acetate-betamethasone sodium phosphate (CELESTONE) injection 3 mg  3 mg Intramuscular Once Edrick Kins, DPM         Musculoskeletal: Strength & Muscle Tone: within normal limits Gait & Station: normal Patient leans: N/A  Psychiatric Specialty Exam: Review of Systems  Musculoskeletal:  Positive for arthralgias and joint swelling.  All other systems reviewed and are negative.  There were no vitals taken for this visit.There is no height or weight on file to calculate BMI.  General Appearance: Casual, Neat, and Well Groomed  Eye Contact:  Good  Speech:  Clear and Coherent  Volume:  Normal  Mood:  Euthymic  Affect:  Appropriate and Congruent  Thought Process:  Goal Directed  Orientation:  Full (Time, Place, and Person)  Thought Content: WDL   Suicidal Thoughts:  No  Homicidal Thoughts:  No  Memory:  Immediate;   Good Recent;   Good Remote;   Good  Judgement:  Good  Insight:  Good  Psychomotor Activity:  Normal  Concentration:  Concentration: Good and Attention Span: Good  Recall:  Good  Fund of Knowledge: Good  Language: Good  Akathisia:  No  Handed:  Right  AIMS (if indicated): not done  Assets:  Communication Skills Desire for Improvement Resilience Social Support Talents/Skills  ADL's:  Intact  Cognition: WNL  Sleep:  Good   Screenings: PHQ2-9    Flowsheet Row Video Visit from  12/07/2021 in [facility] Video Visit from 09/07/2021 in [place] Video Visit from 08/10/2021 in [facility] [facility]  PHQ-2 Total Score 0 0 1      Flowsheet Row Video Visit from 12/07/2021 in [facility] [facility] Video Visit from 09/07/2021 in [place] [facility] [facility] Video Visit from 08/10/2021 in [place] No Risk No Risk No Risk        Assessment and Plan: Patient is a 60 year old female with a history of depression and anxiety.  She is doing well just with the Xanax 0.5 mg twice daily which is helped her anxiety and sleep.  Right now she does not feel the need for antidepressants or any other medications to help with sleep.  She will return to see me in 6 months or call sooner as needed   Levonne Spiller, MD 12/07/2021, 9:54 AM

## 2021-12-13 ENCOUNTER — Other Ambulatory Visit: Payer: Self-pay

## 2021-12-13 MED ORDER — TAMOXIFEN CITRATE 20 MG PO TABS: 20.0000 mg | ORAL_TABLET | Freq: Every day | ORAL | 1 refills | Status: DC

## 2021-12-14 ENCOUNTER — Other Ambulatory Visit: Payer: Self-pay | Admitting: Hematology

## 2021-12-16 ENCOUNTER — Other Ambulatory Visit: Payer: Self-pay | Admitting: Cardiology

## 2021-12-20 ENCOUNTER — Ambulatory Visit
Admission: RE | Admit: 2021-12-20 | Discharge: 2021-12-20 | Disposition: A | Discharge status: Home / Self Care | Payer: Commercial Managed Care - PPO | Source: Ambulatory Visit | Attending: Hematology | Admitting: Hematology

## 2021-12-20 ENCOUNTER — Other Ambulatory Visit: Payer: Self-pay

## 2021-12-20 DIAGNOSIS — Z1231 Encounter for screening mammogram for malignant neoplasm of breast: Secondary | ICD-10-CM

## 2022-02-21 ENCOUNTER — Other Ambulatory Visit: Payer: Self-pay | Admitting: Cardiology

## 2022-03-15 ENCOUNTER — Ambulatory Visit: Payer: Commercial Managed Care - PPO | Admitting: Orthopedic Surgery

## 2022-03-15 NOTE — Progress Notes (Deleted)
No chief complaint on file. ? ? ?HPI: Rachel Sutton is 62 years old she had a right tennis elbow release on February 09, 2011 she is here for reevaluation of painful right elbow ? ? ?Past Medical History:  ?Diagnosis Date  ? Anxiety   ? Arthritis   ? Breast cancer ([place]) 02/22/2012  ? Right T1c, N0  ? C. difficile colitis   ? Colitis, collagenous 09/22/2015  ? Depression   ? Essential hypertension   ? Family history of brain cancer   ? Family history of esophageal cancer   ? Family history of lung cancer   ? Family history of lymphoma   ? Family history of pancreatic cancer   ? Fuchs' corneal dystrophy   ? GERD (gastroesophageal reflux disease)   ? Hypertension   ? Mitral valve prolapse   ? Mild mitral regurgitation  ? Mixed hyperlipidemia   ? Seizures ([place])   ? Only with dose of haldol; no more since that was stopped.  ? ? ?There were no vitals taken for this visit. ? ? ?General appearance: Well-developed well-nourished no gross deformities ? ?Cardiovascular normal pulse and perfusion normal color without edema ? ?Neurologically no sensation loss or deficits or pathologic reflexes ? ?Psychological: Awake alert and oriented x3 mood and affect normal ? ?Skin no lacerations or ulcerations no nodularity no palpable masses, no erythema or nodularity ? ?Musculoskeletal: *** ? ?Imaging *** ? ?A/P ? ?*** ?

## 2022-05-11 ENCOUNTER — Other Ambulatory Visit: Payer: Self-pay | Admitting: Cardiology

## 2022-05-22 ENCOUNTER — Other Ambulatory Visit: Payer: Self-pay | Admitting: Nurse Practitioner

## 2022-05-22 DIAGNOSIS — C50411 Malignant neoplasm of upper-outer quadrant of right female breast: Secondary | ICD-10-CM

## 2022-05-22 NOTE — Progress Notes (Signed)
Rachel Sutton   [phone] [phone]   Clinic Follow up Note   Patient Care Team: Janie Morning, DO as PCP - General (Family Medicine) Satira Sark, MD as PCP - Cardiology (Cardiology) Lendon Colonel, NP as Nurse Practitioner (Nurse Practitioner) Mauro Kaufmann, RN as Oncology Nurse Navigator Rockwell Germany, RN as Oncology Nurse Navigator Truitt Merle, MD as Consulting Physician (Hematology) Jovita Kussmaul, MD as Consulting Physician (General Surgery) Gery Pray, MD as Consulting Physician (Radiation Oncology) Lahoma Rocker, MD as Consulting Physician (Rheumatology) Alla Feeling, NP as Nurse Practitioner (Nurse Practitioner) 05/24/2022  CHIEF COMPLAINT: Follow up right breast cancer   SUMMARY OF ONCOLOGIC HISTORY: Oncology History  Malignant neoplasm of upper-outer quadrant of right breast in female, estrogen receptor positive ([place])  07/01/2019 Mammogram   Diagnostic mammogram 07/01/19  IMPRESSION: Suspicious mass in the 9:30 region of the right breast 9 cm from the nipple measuring 1.3 x 1.3 x 1.1 cm. Suspicious mass in the right axilla measuring 5 x 4 x 4 mm.   07/02/2019 Cancer Staging   Staging form: Breast, AJCC 8th Edition - Clinical stage from 07/02/2019: Stage IA (cT1c, cN0, cM0, G2, ER+, PR+, HER2: Equivocal) - Signed by Truitt Merle, MD on 07/07/2019   07/02/2019 Initial Biopsy   Diagnosis 07/02/19 1. Breast, right, needle core biopsy, 9:30 o'clock - INVASIVE DUCTAL CARCINOMA, GRADE 1-2. SEE NOTE - DUCTAL CARCINOMA IN SITU, INTERMEDIATE GRADE 2. Lymph node, needle/core biopsy, right axilla - LYMPH NODE, NEGATIVE FOR CARCINOMA   07/02/2019 Receptors her2   By immunohistochemistry, the tumor cells are EQUIVOCAL for Her2 (2+). HER2 by [place] will be PERFORMED and the RESULTS REPORTED SEPARATELY Estrogen Receptor: 100%, POSITIVE, STRONG STAINING INTENSITY Progesterone Receptor: 90%, POSITIVE, STRONG STAINING  INTENSITY Proliferation Marker Ki67: 10%   07/06/2019 Initial Diagnosis   Malignant neoplasm of upper-outer quadrant of right breast in female, estrogen receptor positive ([place])   07/08/2019 -  Anti-estrogen oral therapy   Anastrozole 51m once daily starting 07/08/19 before surgery.    07/20/2019 Genetic Testing   Negative genetic testing on the [facility] Multi-Cancer Panel. The report date is 07/20/2019.  The Multi-Gene Panel offered by [facility] includes sequencing and/or deletion duplication testing of the following 85 genes: AIP, ALK, APC, ATM, AXIN2,BAP1,  BARD1, BLM, BMPR1A, BRCA1, BRCA2, BRIP1, CASR, CDC73, CDH1, CDK4, CDKN1B, CDKN1C, CDKN2A (p14ARF), CDKN2A (p16INK4a), CEBPA, CHEK2, CTNNA1, DICER1, DIS3L2, EGFR (c.2369C>T, p.Thr790Met variant only), EPCAM (Deletion/duplication testing only), FH, FLCN, GATA2, GPC3, GREM1 (Promoter region deletion/duplication testing only), HOXB13 (c.251G>A, p.Gly84Glu), HRAS, KIT, MAX, MEN1, MET, MITF (c.952G>A, p.Glu318Lys variant only), MLH1, MSH2, MSH3, MSH6, MUTYH, NBN, NF1, NF2, NTHL1, PALB2, PDGFRA, PHOX2B, PMS2, POLD1, POLE, POT1, PRKAR1A, PTCH1, PTEN, RAD50, RAD51C, RAD51D, RB1, RECQL4, RET, RNF43, RUNX1, SDHAF2, SDHA (sequence changes only), SDHB, SDHC, SDHD, SMAD4, SMARCA4, SMARCB1, SMARCE1, STK11, SUFU, TERC, TERT, TMEM127, TP53, TSC1, TSC2, VHL, WRN and WT1.     08/29/2019 Surgery   RIGHT MASTECTOMY WITH SENTINEL LYMPH NODE BIOPSY and RECONSTRUCTION WITH PLACEMENT OF TISSUE EXPANDER AND FLEX HD (ACELLULAR HYDRATED DERMIS) by Dr. TMarlou Starksand Dr DMarla Roe 09/07/19   09/07/2019 Pathology Results   DIAGNOSIS: 09/07/19  A. SENTINEL LYMPH NODE, RIGHT # 1, BIOPSY:  - One lymph node, negative for carcinoma (0/1).   B. SENTINEL LYMPH NODE, RIGHT # 2, BIOPSY:  - One lymph node, negative for carcinoma (0/1).   C. BREAST, RIGHT, MASTECTOMY:  - Invasive ductal carcinoma, 1.8 cm, Nottingham grade 1 of 3.  - Margins  of resection are not involved (Closest margin: 20  mm, deep).  - Ductal carcinoma in situ, focal.  - Biopsy site changes.  - See oncology table.    09/07/2019 Oncotype testing   Oncotype RS: 17, meaning there is 5% distant recurrence risk at 9 years with AI/tamoxifen alone. Indicating <1% chemo benefit    10/28/2019 Cancer Staging   Staging form: Breast, AJCC 8th Edition - Pathologic: Stage IA (pT1c, pN0, cM0, G1, ER+, PR+, HER2-, Oncotype DX score: 17) - Signed by Truitt Merle, MD on 10/28/2019   12/31/2019 Pathology Results   FINAL MICROSCOPIC DIAGNOSIS:  A. SKIN, RIGHT BREAST MASTECTOMY FLAP AND SCAR:  - Skin and subcutaneous tissue with fibrosis, mild inflammation and  focal giant cell reaction consistent with scar.  - No evidence of malignancy.    02/24/2020 Survivorship   SCP delivered by Cira Rue, NP      CURRENT THERAPY: Anastrozole 1 mg daily, starting 10/28/2019, switched to tamoxifen due to joint pain  INTERVAL HISTORY: Rachel Sutton returns for follow up as scheduled. Last seen by Dr. Burr Medico 11/23/21. L mammo 12/20/21 showed no evidence of malignancy, breast density cat d. She continues tamoxifen, tolerating better than anastrozole.  Joint pains are stable except when she has RA flares.  She was told she has neuropathy from a previous back surgery.  She continues calcium and vitamin D.  She denies concerns in her right chest wall or left breast.  All other systems were reviewed with the patient and are negative.  MEDICAL HISTORY:  Past Medical History:  Diagnosis Date   Anxiety    Arthritis    Breast cancer ([place]) 02/22/2012   Right T1c, N0   C. difficile colitis    Colitis, collagenous 09/22/2015   Depression    Essential hypertension    Family history of brain cancer    Family history of esophageal cancer    Family history of lung cancer    Family history of lymphoma    Family history of pancreatic cancer    Fuchs' corneal dystrophy    GERD (gastroesophageal reflux disease)    Hypertension    Mitral valve prolapse     Mild mitral regurgitation   Mixed hyperlipidemia    Seizures ([place])    Only with dose of haldol; no more since that was stopped.    SURGICAL HISTORY: Past Surgical History:  Procedure Laterality Date   ABDOMINAL HYSTERECTOMY  1999   BACK SURGERY     BIOPSY N/A 09/28/2015   Procedure: BIOPSY;  Surgeon: Danie Binder, MD;  [facility];  Service: Endoscopy;  Laterality: N/A;   BIOPSY  04/05/2016   Procedure: BIOPSY;  Surgeon: Danie Binder, MD;  [facility];  Service: Endoscopy;;  colon bx   BREAST LUMPECTOMY  10/30/2002   right   BREAST RECONSTRUCTION WITH PLACEMENT OF TISSUE EXPANDER AND FLEX HD (ACELLULAR HYDRATED DERMIS) Right 09/07/2019   Procedure: RIGHT BREAST RECONSTRUCTION WITH PLACEMENT OF TISSUE EXPANDER AND FLEX HD (ACELLULAR HYDRATED DERMIS);  Surgeon: Wallace Going, DO;  Location: [place];  Service: Plastics;  Laterality: Right;   CARDIAC CATHETERIZATION     Carpal tunnel R/L  1992   Cervical spine fusion X2 since last visit     COLONOSCOPY WITH PROPOFOL N/A 09/28/2015   Procedure: COLONOSCOPY WITH PROPOFOL;  Surgeon: Danie Binder, MD;  [facility];  Service: Endoscopy;  Laterality: N/A;  procedure 1 cecum time in 1232  time out 1246  total time 14 mintes   COLONOSCOPY WITH PROPOFOL N/A 04/05/2016   Procedure: COLONOSCOPY WITH PROPOFOL;  Surgeon: Danie Binder, MD;  [facility];  Service: Endoscopy;  Laterality: N/A;  1200   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   DEBRIDEMENT AND CLOSURE WOUND Right 02/17/2020   Procedure: Excision of right breast wound with closure and provena placement;  Surgeon: Wallace Going, DO;  Location: [place];  Service: Plastics;  Laterality: Right;   ELBOW SURGERY Right  tennis elbow release 2012   ESOPHAGOGASTRODUODENOSCOPY  10/26/2012   Procedure: ESOPHAGOGASTRODUODENOSCOPY (EGD);  Surgeon: Inda Castle, MD;  Location: [place];  Service: Endoscopy;  Laterality: N/A;    ESOPHAGOGASTRODUODENOSCOPY (EGD) WITH PROPOFOL N/A 09/28/2015   Procedure: ESOPHAGOGASTRODUODENOSCOPY (EGD) WITH PROPOFOL;  Surgeon: Danie Binder, MD;  [facility];  Service: Endoscopy;  Laterality: N/A;   EYE SURGERY  2018   corneal transplants bilateral   KNEE ARTHROSCOPY Left    LEFT HEART CATHETERIZATION WITH CORONARY ANGIOGRAM N/A 10/23/2012   Procedure: LEFT HEART CATHETERIZATION WITH CORONARY ANGIOGRAM;  Surgeon: Peter M Martinique, MD;  [facility];  Service: Cardiovascular;  Laterality: N/A;   MASTECTOMY Right    MASTECTOMY W/ SENTINEL NODE BIOPSY Right 09/07/2019   Procedure: RIGHT MASTECTOMY WITH SENTINEL LYMPH NODE BIOPSY;  Surgeon: Jovita Kussmaul, MD;  Location: [place];  Service: General;  Laterality: Right;   OVARIAN CYST REMOVAL     prior to hysterectomy, right   Ray cage fusion  [place] Right 12/31/2019   Procedure: REMOVAL OF TISSUE EXPANDER AND EXCESS SKIN;  Surgeon: Wallace Going, DO;  Location: [place];  Service: Plastics;  Laterality: Right;  1 hour   SENTINEL LYMPH NODE BIOPSY  10/30/2002   right   TONSILLECTOMY     at six months old    I have reviewed the social history and family history with the patient and they are unchanged from previous note.  ALLERGIES:  is allergic to bee venom, haloperidol lactate, meperidine hcl, and xifaxan [rifaximin].  MEDICATIONS:  Current Outpatient Medications  Medication Sig Dispense Refill   acetaminophen (TYLENOL) 500 MG tablet Take 1,000 mg by mouth every 6 (six) hours as needed for moderate pain or headache.     ALPRAZolam (XANAX) 0.5 MG tablet Take 1 tablet (0.5 mg total) by mouth 2 (two) times daily as needed for anxiety. 180 tablet 1   calcium carbonate (OS-CAL) 1250 (500 Ca) MG chewable tablet Chew 1 tablet by mouth daily.     Calcium-Magnesium-Vitamin D (CALCIUM 500 PO) Take 1 tablet by mouth daily.      diclofenac sodium (VOLTAREN) 1 % GEL Apply 1 application  topically 3 (three) times daily as needed (pain).      EPINEPHrine (EPI-PEN) 0.3 mg/0.3 mL DEVI Inject 0.3 mg into the muscle as needed (bee stings).      fish oil-omega-3 fatty acids 1000 MG capsule Take 1 g by mouth every morning.      gabapentin (NEURONTIN) 100 MG capsule TAKE 1 CAPSULE(100 MG) BY MOUTH THREE TIMES DAILY 270 capsule 3   hydrochlorothiazide (MICROZIDE) 12.5 MG capsule TAKE 1 CAPSULE DAILY 90 capsule 3   hydroxychloroquine (PLAQUENIL) 200 MG tablet Take 400 mg by mouth daily.     leflunomide (ARAVA) 20 MG tablet Take 20 mg by mouth daily.     metoprolol succinate (TOPROL-XL) 50 MG 24 hr tablet TAKE 1 TABLET  TWICE A DAY  WITH OR IMMEDIATELY        FOLLOWING A MEAL 180 tablet 2   Multiple Vitamin (MULITIVITAMIN WITH MINERALS) TABS Take 1 tablet by mouth every morning.     NIFEdipine (PROCARDIA XL/NIFEDICAL-XL) 90 MG 24 hr tablet TAKE 1 TABLET DAILY 90 tablet 3   nitroGLYCERIN (NITROSTAT) 0.4 MG SL tablet Place 1 tablet (0.4 mg total) under the tongue every 5 (five) minutes x 3 doses as needed for chest pain. 25 tablet 3   pantoprazole (PROTONIX) 40 MG tablet TAKE 1 TABLET DAILY. 90 tablet 3   sulfaSALAzine (AZULFIDINE) 500 MG tablet Take 500 mg by mouth daily.     tamoxifen (NOLVADEX) 20 MG tablet TAKE 1 TABLET DAILY. 90 tablet 1   Current Facility-Administered Medications  Medication Dose Route Frequency Provider Last Rate Last Admin   betamethasone acetate-betamethasone sodium phosphate (CELESTONE) injection 3 mg  3 mg Intramuscular Once Edrick Kins, DPM        PHYSICAL EXAMINATION: ECOG PERFORMANCE STATUS: 0 - Asymptomatic  Vitals:   05/24/22 1018  BP: (!) 142/72  Pulse: 96  Resp: 18  Temp: 98.5 F (36.9 C)  SpO2: 97%   Filed Weights   05/24/22 1018  Weight: 181 lb 4.8 oz (82.2 kg)    GENERAL:alert, no distress and comfortable SKIN: No rash EYES: sclera clear NECK: Without mass LYMPH:  no palpable cervical or supraclavicular lymphadenopathy  LUNGS:  normal breathing effort HEART:  no lower extremity edema ABDOMEN:abdomen soft, non-tender and normal bowel sounds NEURO: alert & oriented x 3 with fluent speech, no focal motor deficits Breast exam: S/p right mastectomy, incision/scar healed.  No left nipple discharge or inversion.  No palpable mass or nodularity along the right scar, chest wall, left breast, or either axilla that I could appreciate.  LABORATORY DATA:  I have reviewed the data as listed    Latest Ref Rng & Units 05/24/2022   10:02 AM 11/23/2021   11:35 AM 04/26/2021    9:12 AM  CBC  WBC 4.0 - 10.5 K/uL 8.7  10.7  9.5   Hemoglobin 12.0 - 15.0 g/dL 14.3  13.4  13.8   Hematocrit 36.0 - 46.0 % 41.3  39.8  40.5   Platelets 150 - 400 K/uL 280  347  417         Latest Ref Rng & Units 05/24/2022   10:02 AM 11/23/2021   11:35 AM 04/26/2021    9:12 AM  CMP  Glucose 70 - 99 mg/dL 181  136  114   BUN 6 - 20 mg/dL 13  12  12    Creatinine 0.44 - 1.00 mg/dL 0.83  0.77  0.80   Sodium 135 - 145 mmol/L 140  140  140   Potassium 3.5 - 5.1 mmol/L 3.8  3.7  4.0   Chloride 98 - 111 mmol/L 105  103  103   CO2 22 - 32 mmol/L 27  26  27    Calcium 8.9 - 10.3 mg/dL 9.6  9.3  9.8   Total Protein 6.5 - 8.1 g/dL 7.4  7.6  7.7   Total Bilirubin 0.3 - 1.2 mg/dL 0.5  0.6  0.5   Alkaline Phos 38 - 126 U/L 80  106  106   AST 15 - 41 U/L 33  25  32   ALT 0 - 44 U/L 24  20  25        RADIOGRAPHIC STUDIES: I have personally reviewed the radiological images as  listed and agreed with the findings in the report. No results found.   ASSESSMENT & PLAN: Rachel Sutton is a 61 y.o. female with    1. Malignant neoplasm of upper-outer quadrant of right breast, Stage IA, pT1cN0M0, ER+/PP+, HER2-, Grade 1 -Diagnosed in 08/2019. Given prior right lumpectomy and RT she underwent right mastectomy on 09/07/19. She has a low risk RS of 17 and adjuvant chemotherapy was not recommended. -Node-negative, postmastectomy radiation was not required. She underwent  right breast reconstruction on 2/92/44 with wound complications afterward and it was removed.  -She started anastrozole before surgery on 10/28/19.  Goal is for a total of 5-7 years. She is tolerating well with mood swings and arthralgia, ultimately switched to tamoxifen 11/23/2021. -Rachel Sutton is clinically doing well.  Tolerating tamoxifen much better.  Breast exam is unremarkable.  Labs are normal.  I reviewed the left mammo from 12/2021 which was benign.  Overall there is no clinical concern for breast cancer recurrence. -She is nearing 3 years from initial diagnosis, the recurrence risk has decreased.  Continue surveillance and tamoxifen. -I encouraged her to continue healthy active lifestyle and stay up-to-date on age-appropriate health maintenance and cancer screenings -Next surveillance visit in 6 months, or sooner if needed   2. H/o Right breast ductal carcinoma, pT1cN0M0 stage IA, ER-/PR-, HER2 +, Grade 3, Genetic testing was negative for pathogenetic mutations.  -Diagnosed on 10/16/02 managed by Dr. Eston Esters  -Treated with right lumpectomy and SNLB, Adjuvant chemo AC-Docetaxol/Herceptin and RT   3. Osteopenia  -Her 11/2012 DEXA was normal.  -07/2020 DEXA showed osteopenia with lowest T-score -1.1 at left hip. Continue calcium and Vitd, and weightbearing exercise -We reviewed the bone strengthening quality of tamoxifen -Repeat DEXA 07/2022   4. Rheumatoid arthritis, diagnosed in 2018.  -She is seen by Rheumatologist Dr. Kathlene November -She was previously on low dose weekly methotrexate and Cimzia self-injections q2weeks. She stopped after breast cancer diagnosis in 08/2019 -she experienced worsening stiffness/pain in her hands and new meds added   5. H/o Mitral Valve, HTN, GERD, Smoking Cessation -Had cardiac cath in 2013, no stent placed.  -She used to smoke 2 ppd. She has a 30+ year history.  Previously counseled on smoking cessation.    6. Anxiety -She was on Paxil before and currently  on Xanax 0.20m -she is followed by Dr. RHarrington Challengerwith behavioral health.   Plan: -Labs and mammogram reviewed -We will fax labs to rheumatologist per patient request -Continue breast cancer surveillance and tamoxifen -Next DEXA 07/2022 -Follow-up in 6 months, or sooner if needed    Orders Placed This Encounter  Procedures   DG Bone Density    Standing Status:   Future    Standing Expiration Date:   05/24/2023    Order Specific Question:   Reason for Exam (SYMPTOM  OR DIAGNOSIS REQUIRED)    Answer:   Osteopenia, on antiestrogen therapy (previously AI now tamoxifen)    Order Specific Question:   Is the patient pregnant?    Answer:   No    Order Specific Question:   Preferred imaging location?    Answer:   [facility]  All questions were answered. The patient knows to call the clinic with any problems, questions or concerns. No barriers to learning were detected.      LAlla Feeling NP 05/24/22

## 2022-05-23 ENCOUNTER — Ambulatory Visit: Payer: Commercial Managed Care - PPO | Admitting: Podiatry

## 2022-05-23 DIAGNOSIS — M5416 Radiculopathy, lumbar region: Secondary | ICD-10-CM

## 2022-05-23 MED ORDER — GABAPENTIN 100 MG PO CAPS
ORAL_CAPSULE | ORAL | 3 refills | Status: DC
Start: 2022-05-23 — End: 2022-10-11

## 2022-05-23 NOTE — Progress Notes (Signed)
HPI: 61 y.o. 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  Patient has a history of lumbar surgery back in the 1990s and she experiences constant numbness with pins-and-needles and burning sensations to the bilateral toes has been going on for several years.  Patient is not diabetic.  She says the gabapentin helps tremendously.  She is requesting a refill today of the gabapentin.  Past Medical History:  Diagnosis Date   Anxiety    Arthritis    Breast cancer ([place]) 02/22/2012   Right T1c, N0   C. difficile colitis    Colitis, collagenous 09/22/2015   Depression    Essential hypertension    Family history of brain cancer    Family history of esophageal cancer    Family history of lung cancer    Family history of lymphoma    Family history of pancreatic cancer    Fuchs' corneal dystrophy    GERD (gastroesophageal reflux disease)    Hypertension    Mitral valve prolapse    Mild mitral regurgitation   Mixed hyperlipidemia    Seizures ([place])    Only with dose of haldol; no more since that was stopped.    Past Surgical History:  Procedure Laterality Date   ABDOMINAL HYSTERECTOMY  1999   BACK SURGERY     BIOPSY N/A 09/28/2015   Procedure: BIOPSY;  Surgeon: Danie Binder, MD;  [facility];  Service: Endoscopy;  Laterality: N/A;   BIOPSY  04/05/2016   Procedure: BIOPSY;  Surgeon: Danie Binder, MD;  [facility];  Service: Endoscopy;;  colon bx   BREAST LUMPECTOMY  10/30/2002   right   BREAST RECONSTRUCTION WITH PLACEMENT OF TISSUE EXPANDER AND FLEX HD (ACELLULAR HYDRATED DERMIS) Right 09/07/2019   Procedure: RIGHT BREAST RECONSTRUCTION WITH PLACEMENT OF TISSUE EXPANDER AND FLEX HD (ACELLULAR HYDRATED DERMIS);  Surgeon: Wallace Going, DO;  Location: [place];  Service: Plastics;  Laterality: Right;   CARDIAC CATHETERIZATION     Carpal tunnel R/L  1992   Cervical spine fusion X2 since last visit      COLONOSCOPY WITH PROPOFOL N/A 09/28/2015   Procedure: COLONOSCOPY WITH PROPOFOL;  Surgeon: Danie Binder, MD;  [facility];  Service: Endoscopy;  Laterality: N/A;  procedure 1 cecum time in 1232  time out 1246   total time 14 mintes   COLONOSCOPY WITH PROPOFOL N/A 04/05/2016   Procedure: COLONOSCOPY WITH PROPOFOL;  Surgeon: Danie Binder, MD;  [facility];  Service: Endoscopy;  Laterality: N/A;  1200   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   DEBRIDEMENT AND CLOSURE WOUND Right 02/17/2020   Procedure: Excision of right breast wound with closure and provena placement;  Surgeon: Wallace Going, DO;  Location: [place];  Service: Plastics;  Laterality: Right;   ELBOW SURGERY Right  tennis elbow release 2012   ESOPHAGOGASTRODUODENOSCOPY  10/26/2012   Procedure: ESOPHAGOGASTRODUODENOSCOPY (EGD);  Surgeon: Inda Castle, MD;  Location: [place];  Service: Endoscopy;  Laterality: N/A;   ESOPHAGOGASTRODUODENOSCOPY (EGD) WITH PROPOFOL N/A 09/28/2015   Procedure: ESOPHAGOGASTRODUODENOSCOPY (EGD) WITH PROPOFOL;  Surgeon: Danie Binder, MD;  [facility];  Service: Endoscopy;  Laterality: N/A;   EYE SURGERY  2018   corneal transplants bilateral   KNEE ARTHROSCOPY Left    LEFT HEART CATHETERIZATION WITH CORONARY ANGIOGRAM N/A 10/23/2012   Procedure: LEFT HEART CATHETERIZATION WITH CORONARY ANGIOGRAM;  Surgeon: Collier Salina  M Martinique, MD;  Location: [facility] C[facility] LAB;  Service: Cardiovascular;  Laterality: N/A;   MASTECTOMY Right    MASTECTOMY W/ SENTINEL NODE BIOPSY Right 09/07/2019   Procedure: RIGHT MASTECTOMY WITH SENTINEL LYMPH NODE BIOPSY;  Surgeon: Jovita Kussmaul, MD;  Location: [place];  Service: General;  Laterality: Right;   OVARIAN CYST REMOVAL     prior to hysterectomy, right   Ray cage fusion  [place] Right 12/31/2019   Procedure: REMOVAL OF TISSUE EXPANDER AND EXCESS SKIN;  Surgeon: Wallace Going, DO;  Location: [place];  Service: Plastics;  Laterality: Right;  1 hour   SENTINEL LYMPH NODE BIOPSY  10/30/2002   right   TONSILLECTOMY     at six months old    Allergies  Allergen Reactions   Bee Venom Anaphylaxis   Haloperidol Lactate Other (See Comments)    Convulsions    Meperidine Hcl Hives   Xifaxan [Rifaximin] Diarrhea and Other (See Comments)    Constantly felt the need to use the bathroom      Physical Exam: General: The patient is alert and oriented x3 in no acute distress.  Dermatology: Skin is warm, dry and supple bilateral lower extremities. Negative for open lesions or macerations.  Vascular: Palpable pedal pulses bilaterally. Capillary refill within normal limits.  Negative for any significant edema or erythema  Neurological: Light touch and protective threshold diminished  Musculoskeletal Exam: No pedal deformities noted   Assessment: 1.  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 Patient evaluated.  2.  Refill prescription for gabapentin 100 mg TID  3.  Recommended to the patient that since this is a long-term medication that she will likely need to be on chronically I do recommend long-term medication management with PCP.  She will follow-up with her PCP 4.  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 Edrick Kins, DPM    [address],  [address]                Office [phone]  Fax [phone]

## 2022-05-24 ENCOUNTER — Other Ambulatory Visit: Payer: Self-pay

## 2022-05-24 ENCOUNTER — Inpatient Hospital Stay: Payer: Commercial Managed Care - PPO | Attending: Nurse Practitioner

## 2022-05-24 ENCOUNTER — Inpatient Hospital Stay (HOSPITAL_BASED_OUTPATIENT_CLINIC_OR_DEPARTMENT_OTHER): Payer: Commercial Managed Care - PPO | Admitting: Nurse Practitioner

## 2022-05-24 ENCOUNTER — Encounter: Payer: Self-pay | Admitting: Nurse Practitioner

## 2022-05-24 VITALS — BP 142/72 | HR 96 | Temp 98.5°F | Resp 18 | Ht 63.0 in | Wt 181.3 lb

## 2022-05-24 DIAGNOSIS — M8580 Other specified disorders of bone density and structure, unspecified site: Secondary | ICD-10-CM | POA: Insufficient documentation

## 2022-05-24 DIAGNOSIS — Z170 Estrogen receptor positive status [ER+]: Secondary | ICD-10-CM | POA: Diagnosis not present

## 2022-05-24 DIAGNOSIS — Z9011 Acquired absence of right breast and nipple: Secondary | ICD-10-CM | POA: Diagnosis not present

## 2022-05-24 DIAGNOSIS — C50411 Malignant neoplasm of upper-outer quadrant of right female breast: Secondary | ICD-10-CM

## 2022-05-24 DIAGNOSIS — M069 Rheumatoid arthritis, unspecified: Secondary | ICD-10-CM | POA: Diagnosis not present

## 2022-05-24 DIAGNOSIS — G629 Polyneuropathy, unspecified: Secondary | ICD-10-CM | POA: Insufficient documentation

## 2022-05-24 DIAGNOSIS — F419 Anxiety disorder, unspecified: Secondary | ICD-10-CM | POA: Diagnosis not present

## 2022-05-24 DIAGNOSIS — Z79810 Long term (current) use of selective estrogen receptor modulators (SERMs): Secondary | ICD-10-CM | POA: Insufficient documentation

## 2022-05-24 LAB — CMP (CANCER CENTER ONLY)
ALT: 24 U/L (ref 0–44)
AST: 33 U/L (ref 15–41)
Albumin: 4.3 g/dL (ref 3.5–5.0)
Alkaline Phosphatase: 80 U/L (ref 38–126)
Anion gap: 8 (ref 5–15)
BUN: 13 mg/dL (ref 6–20)
CO2: 27 mmol/L (ref 22–32)
Calcium: 9.6 mg/dL (ref 8.9–10.3)
Chloride: 105 mmol/L (ref 98–111)
Creatinine: 0.83 mg/dL (ref 0.44–1.00)
GFR, Estimated: >60 mL/min (ref >60)
Glucose, Bld: 181 mg/dL — ABNORMAL HIGH (ref 70–99)
Potassium: 3.8 mmol/L (ref 3.5–5.1)
Sodium: 140 mmol/L (ref 135–145)
Total Bilirubin: 0.5 mg/dL (ref 0.3–1.2)
Total Protein: 7.4 g/dL (ref 6.5–8.1)

## 2022-05-24 LAB — CBC WITH DIFFERENTIAL (CANCER CENTER ONLY)
Abs Immature Granulocytes: 0.01 10*3/uL (ref 0.00–0.07)
Basophils Absolute: 0.1 10*3/uL (ref 0.0–0.1)
Basophils Relative: 1 %
Eosinophils Absolute: 0.4 10*3/uL (ref 0.0–0.5)
Eosinophils Relative: 4 %
HCT: 41.3 % (ref 36.0–46.0)
Hemoglobin: 14.3 g/dL (ref 12.0–15.0)
Immature Granulocytes: 0 %
Lymphocytes Relative: 46 %
Lymphs Abs: 4 10*3/uL (ref 0.7–4.0)
MCH: 30.4 pg (ref 26.0–34.0)
MCHC: 34.6 g/dL (ref 30.0–36.0)
MCV: 87.9 fL (ref 80.0–100.0)
Monocytes Absolute: 0.7 10*3/uL (ref 0.1–1.0)
Monocytes Relative: 8 %
Neutro Abs: 3.6 10*3/uL (ref 1.7–7.7)
Neutrophils Relative %: 41 %
Platelet Count: 280 10*3/uL (ref 150–400)
RBC: 4.7 MIL/uL (ref 3.87–5.11)
RDW: 13.4 % (ref 11.5–15.5)
WBC Count: 8.7 10*3/uL (ref 4.0–10.5)
nRBC: 0 % (ref 0.0–0.2)

## 2022-05-29 ENCOUNTER — Other Ambulatory Visit: Payer: Self-pay | Admitting: Hematology

## 2022-06-07 ENCOUNTER — Telehealth (INDEPENDENT_AMBULATORY_CARE_PROVIDER_SITE_OTHER): Payer: Self-pay | Admitting: Psychiatry

## 2022-06-07 ENCOUNTER — Encounter (HOSPITAL_COMMUNITY): Payer: Self-pay | Admitting: Psychiatry

## 2022-06-07 DIAGNOSIS — F3341 Major depressive disorder, recurrent, in partial remission: Secondary | ICD-10-CM

## 2022-06-07 MED ORDER — ALPRAZOLAM 0.5 MG PO TABS: 0.5000 mg | ORAL_TABLET | Freq: Two times a day (BID) | ORAL | 1 refills | Status: DC | PRN

## 2022-06-07 MED ORDER — PAROXETINE HCL 10 MG PO TABS: 10.0000 mg | ORAL_TABLET | Freq: Every day | ORAL | 2 refills | Status: DC

## 2022-06-07 NOTE — Progress Notes (Signed)
Virtual Visit via Video Note  I connected with Rachel Sutton on 06/07/22 at  9:00 AM EDT by a video enabled telemedicine application and verified that I am speaking with the correct person using two identifiers.  Location: Patient: home Provider: office   I discussed the limitations of evaluation and management by telemedicine and the availability of in person appointments. The patient expressed understanding and agreed to proceed.     I discussed the assessment and treatment plan with the patient. The patient was provided an opportunity to ask questions and all were answered. The patient agreed with the plan and demonstrated an understanding of the instructions.   The patient was advised to call back or seek an in-person evaluation if the symptoms worsen or if the condition fails to improve as anticipated.  I provided 15 minutes of non-face-to-face time during this encounter.   Levonne Spiller, MD  [facility] MD/PA/NP OP Progress Note  06/07/2022 9:13 AM Rachel Sutton  MRN:  [id number]  Chief Complaint:  Chief Complaint  Patient presents with   Anxiety   Depression   Follow-up   HPI:  This patient is a 61 year old married white female who is currently living in [place].  She works as an Optometrist for [employer]  The patient returns for follow-up after 6 months.  She states that she and her husband are having more marital issues.  She found out he had been connecting with other women over the Internet.  This started approximately 3 years ago after her mastectomy.  She is upset and mistrustful of him.  She is try to engage him with marital therapy but he has not been showing up for the sessions.  I urged her to continue these on her own as this will help her.  The patient denies serious depression but is obviously stressed.  We discussed returning to an antidepressant and she agrees to try low-dose of paroxetine.  She still uses the Xanax which is helpful for her anxiety.  She  denies any thoughts of self-harm or suicide.  She is still eating and sleeping well although she has been recently diagnosed with neuropathy which is responsive to gabapentin. Visit Diagnosis:    ICD-10-CM   1. Recurrent major depressive disorder, in partial remission ([place])  F33.41       Past Psychiatric History: Long-term outpatient treatment  Past Medical History:  Past Medical History:  Diagnosis Date   Anxiety    Arthritis    Breast cancer ([place]) 02/22/2012   Right T1c, N0   C. difficile colitis    Colitis, collagenous 09/22/2015   Depression    Essential hypertension    Family history of brain cancer    Family history of esophageal cancer    Family history of lung cancer    Family history of lymphoma    Family history of pancreatic cancer    Fuchs' corneal dystrophy    GERD (gastroesophageal reflux disease)    Hypertension    Mitral valve prolapse    Mild mitral regurgitation   Mixed hyperlipidemia    Seizures ([place])    Only with dose of haldol; no more since that was stopped.    Past Surgical History:  Procedure Laterality Date   ABDOMINAL HYSTERECTOMY  1999   BACK SURGERY     BIOPSY N/A 09/28/2015   Procedure: BIOPSY;  Surgeon: Danie Binder, MD;  Location: [facility];  Service: Endoscopy;  Laterality: N/A;   BIOPSY  04/05/2016   Procedure:  BIOPSY;  Surgeon: Danie Binder, MD;  [facility];  Service: Endoscopy;;  colon bx   BREAST LUMPECTOMY  10/30/2002   right   BREAST RECONSTRUCTION WITH PLACEMENT OF TISSUE EXPANDER AND FLEX HD (ACELLULAR HYDRATED DERMIS) Right 09/07/2019   Procedure: RIGHT BREAST RECONSTRUCTION WITH PLACEMENT OF TISSUE EXPANDER AND FLEX HD (ACELLULAR HYDRATED DERMIS);  Surgeon: Wallace Going, DO;  Location: [place];  Service: Plastics;  Laterality: Right;   CARDIAC CATHETERIZATION     Carpal tunnel R/L  1992   Cervical spine fusion X2 since last visit     COLONOSCOPY WITH PROPOFOL N/A 09/28/2015   Procedure: COLONOSCOPY WITH  PROPOFOL;  Surgeon: Danie Binder, MD;  Location: [facility];  Service: Endoscopy;  Laterality: N/A;  procedure 1 cecum time in 1232  time out 1246   total time 14 mintes   COLONOSCOPY WITH PROPOFOL N/A 04/05/2016   Procedure: COLONOSCOPY WITH PROPOFOL;  Surgeon: Danie Binder, MD;  [facility];  Service: Endoscopy;  Laterality: N/A;  1200   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   DEBRIDEMENT AND CLOSURE WOUND Right 02/17/2020   Procedure: Excision of right breast wound with closure and provena placement;  Surgeon: Wallace Going, DO;  Location: [place];  Service: Plastics;  Laterality: Right;   ELBOW SURGERY Right  tennis elbow release 2012   ESOPHAGOGASTRODUODENOSCOPY  10/26/2012   Procedure: ESOPHAGOGASTRODUODENOSCOPY (EGD);  Surgeon: Inda Castle, MD;  Location: [place];  Service: Endoscopy;  Laterality: N/A;   ESOPHAGOGASTRODUODENOSCOPY (EGD) WITH PROPOFOL N/A 09/28/2015   Procedure: ESOPHAGOGASTRODUODENOSCOPY (EGD) WITH PROPOFOL;  Surgeon: Danie Binder, MD;  [facility];  Service: Endoscopy;  Laterality: N/A;   EYE SURGERY  2018   corneal transplants bilateral   KNEE ARTHROSCOPY Left    LEFT HEART CATHETERIZATION WITH CORONARY ANGIOGRAM N/A 10/23/2012   Procedure: LEFT HEART CATHETERIZATION WITH CORONARY ANGIOGRAM;  Surgeon: Peter M Martinique, MD;  Location: [facility];  Service: Cardiovascular;  Laterality: N/A;   MASTECTOMY Right    MASTECTOMY W/ SENTINEL NODE BIOPSY Right 09/07/2019   Procedure: RIGHT MASTECTOMY WITH SENTINEL LYMPH NODE BIOPSY;  Surgeon: Jovita Kussmaul, MD;  Location: [place];  Service: General;  Laterality: Right;   OVARIAN CYST REMOVAL     prior to hysterectomy, right   Ray cage fusion  [place] Right 12/31/2019   Procedure: REMOVAL OF TISSUE EXPANDER AND EXCESS SKIN;  Surgeon: Wallace Going, DO;  Location: [place];  Service: Plastics;  Laterality: Right;  1 hour   SENTINEL  LYMPH NODE BIOPSY  10/30/2002   right   TONSILLECTOMY     at 71 old    Family Psychiatric History: see below  Family History:  Family History  Problem Relation Age of Onset   Diabetes Mother    Pancreatic cancer Mother 51   Lung cancer Father    Diabetes Father    Heart disease Father    Esophageal cancer Father 7       Beer drinker   Schizophrenia Sister    Cancer Maternal Uncle        unknown form of cancer; cigar smoker   Lung cancer Maternal Grandmother    Brain cancer Maternal Grandfather    Heart disease Paternal Grandfather    Brain cancer Cousin        dx in his late 42s-40s   Brain cancer Brother 41  Lymphoma Niece 32   Anesthesia problems Neg Hx    Hypotension Neg Hx    Malignant hyperthermia Neg Hx    Pseudochol deficiency Neg Hx    Colon cancer Neg Hx    Colon polyps Neg Hx    Crohn's disease Neg Hx    Ulcerative colitis Neg Hx    Breast cancer Neg Hx     Social History:  Social History   Socioeconomic History   Marital status: Married    Spouse name: Not on file   Number of children: 2   Years of education: Not on file   Highest education level: Not on file  Occupational History   Occupation: Aeronautical engineer: [employer]    Comment: [employer]  Tobacco Use   Smoking status: Former    Packs/day: 0.50    Years: 35.00    Total pack years: 17.50    Types: Cigarettes    Start date: 07/07/1974    Quit date: 07/14/2019    Years since quitting: 2.9   Smokeless tobacco: Never  Vaping Use   Vaping Use: Never used  Substance and Sexual Activity   Alcohol use: Not Currently    Alcohol/week: 0.0 standard drinks of alcohol    Comment: stopped July 26, 2014)   Drug use: No   Sexual activity: Not Currently    Birth control/protection: Surgical  Other Topics Concern   Not on file  Social History Narrative   Not on file   Social Determinants of Health   Financial Resource Strain: Not on file  Food Insecurity: Not on file   Transportation Needs: Not on file  Physical Activity: Not on file  Stress: Not on file  Social Connections: Not on file    Allergies:  Allergies  Allergen Reactions   Bee Venom Anaphylaxis   Haloperidol Lactate Other (See Comments)    Convulsions    Meperidine Hcl Hives   Xifaxan [Rifaximin] Diarrhea and Other (See Comments)    Constantly felt the need to use the bathroom     Metabolic Disorder Labs: No results found for: "HGBA1C", "MPG" No results found for: "PROLACTIN" Lab Results  Component Value Date   CHOL 207 (H) 10/23/2012   TRIG 319 (H) 10/23/2012   HDL 33 (L) 10/23/2012   CHOLHDL 6.3 10/23/2012   VLDL 64 (H) 10/23/2012   LDLCALC 110 (H) 10/23/2012   Lab Results  Component Value Date   TSH 0.706 09/22/2015    Therapeutic Level Labs: No results found for: "LITHIUM" No results found for: "VALPROATE" No results found for: "CBMZ"  Current Medications: Current Outpatient Medications  Medication Sig Dispense Refill   PARoxetine (PAXIL) 10 MG tablet Take 1 tablet (10 mg total) by mouth daily. 30 tablet 2   acetaminophen (TYLENOL) 500 MG tablet Take 1,000 mg by mouth every 6 (six) hours as needed for moderate pain or headache.     ALPRAZolam (XANAX) 0.5 MG tablet Take 1 tablet (0.5 mg total) by mouth 2 (two) times daily as needed for anxiety. 180 tablet 1   Calcium-Magnesium-Vitamin D (CALCIUM 500 PO) Take 1 tablet by mouth daily.      diclofenac sodium (VOLTAREN) 1 % GEL Apply 1 application topically 3 (three) times daily as needed (pain).      EPINEPHrine (EPI-PEN) 0.3 mg/0.3 mL DEVI Inject 0.3 mg into the muscle as needed (bee stings).      fish oil-omega-3 fatty acids 1000 MG capsule Take 1 g by mouth every morning.  gabapentin (NEURONTIN) 100 MG capsule TAKE 1 CAPSULE(100 MG) BY MOUTH THREE TIMES DAILY 270 capsule 3   hydrochlorothiazide (MICROZIDE) 12.5 MG capsule TAKE 1 CAPSULE DAILY 90 capsule 3   hydroxychloroquine (PLAQUENIL) 200 MG tablet Take 400  mg by mouth daily.     leflunomide (ARAVA) 20 MG tablet Take 20 mg by mouth daily.     metoprolol succinate (TOPROL-XL) 50 MG 24 hr tablet TAKE 1 TABLET TWICE A DAY  WITH OR IMMEDIATELY        FOLLOWING A MEAL 180 tablet 2   Multiple Vitamin (MULITIVITAMIN WITH MINERALS) TABS Take 1 tablet by mouth every morning.     NIFEdipine (PROCARDIA XL/NIFEDICAL-XL) 90 MG 24 hr tablet TAKE 1 TABLET DAILY 90 tablet 3   nitroGLYCERIN (NITROSTAT) 0.4 MG SL tablet Place 1 tablet (0.4 mg total) under the tongue every 5 (five) minutes x 3 doses as needed for chest pain. 25 tablet 3   pantoprazole (PROTONIX) 40 MG tablet TAKE 1 TABLET DAILY. 90 tablet 3   sulfaSALAzine (AZULFIDINE) 500 MG tablet Take 500 mg by mouth daily.     tamoxifen (NOLVADEX) 20 MG tablet TAKE 1 TABLET DAILY 90 tablet 1   Current Facility-Administered Medications  Medication Dose Route Frequency Provider Last Rate Last Admin   betamethasone acetate-betamethasone sodium phosphate (CELESTONE) injection 3 mg  3 mg Intramuscular Once Edrick Kins, DPM         Musculoskeletal: Strength & Muscle Tone: within normal limits Gait & Station: normal Patient leans: N/A  Psychiatric Specialty Exam: Review of Systems  Neurological:  Positive for numbness.       [place]  Psychiatric/Behavioral:  Positive for dysphoric mood. The patient is nervous/anxious.   All other systems reviewed and are negative.   There were no vitals taken for this visit.There is no height or weight on file to calculate BMI.  General Appearance: Casual and Fairly Groomed  Eye Contact:  Good  Speech:  Clear and Coherent  Volume:  Normal  Mood:  Euthymic  Affect:  Appropriate and Congruent  Thought Process:  Goal Directed  Orientation:  Full (Time, Place, and Person)  Thought Content: Rumination   Suicidal Thoughts:  No  Homicidal Thoughts:  No  Memory:  Immediate;   Good Recent;   Good Remote;   Good  Judgement:  Good  Insight:  Good  Psychomotor Activity:   Normal  Concentration:  Concentration: Good and Attention Span: Good  Recall:  Good  Fund of Knowledge: Good  Language: Good  Akathisia:  No  Handed:  Right  AIMS (if indicated): not done  Assets:  Communication Skills Desire for Improvement Resilience Social Support Talents/Skills  ADL's:  Intact  Cognition: WNL  Sleep:  Good   Screenings: PHQ2-9    Flowsheet Row Video Visit from 06/07/2022 in [place] Video Visit from 12/07/2021 in [facility] [facility] Video Visit from 09/07/2021 in [facility] [facility] [facility] Video Visit from 08/10/2021 in [place] [facility]  PHQ-2 Total Score 0 0 0 1      Flowsheet Row Video Visit from 06/07/2022 in [place] [facility] Video Visit from 12/07/2021 in [place] [facility] Video Visit from 09/07/2021 in [place] No Risk No Risk No Risk        Assessment and Plan: This patient is a 61 year old female with a history of depression and anxiety.  Given the recent  marital issues she is become a bit more depressed.  She is agreeing to engage in therapy.  She will continue Xanax 0.5 mg twice daily for anxiety and sleep and add paroxetine 10 mg daily for depression.  She will return to see me in 3 months or call sooner as needed  Collaboration of Care: Collaboration of Care: Other provider involved in patient's care AEB notes are available for review by oncology in our system  Patient/Guardian was advised Release of Information must be obtained prior to any record release in order to collaborate their care with an outside provider. Patient/Guardian was advised if they have not already done so to contact the registration department to sign all necessary forms in order for [place] to release  information regarding their care.   Consent: Patient/Guardian gives verbal consent for treatment and assignment of benefits for services provided during this visit. Patient/Guardian expressed understanding and agreed to proceed.    Levonne Spiller, MD 06/07/2022, 9:13 AM

## 2022-06-08 ENCOUNTER — Other Ambulatory Visit (HOSPITAL_COMMUNITY): Payer: Self-pay | Admitting: Psychiatry

## 2022-06-11 ENCOUNTER — Other Ambulatory Visit (HOSPITAL_COMMUNITY): Payer: Self-pay | Admitting: Psychiatry

## 2022-06-11 MED ORDER — SERTRALINE HCL 25 MG PO TABS: 25.0000 mg | ORAL_TABLET | Freq: Every day | ORAL | 2 refills | Status: DC

## 2022-06-13 ENCOUNTER — Telehealth (HOSPITAL_COMMUNITY): Payer: Self-pay | Admitting: *Deleted

## 2022-06-13 NOTE — Telephone Encounter (Signed)
Patient pharmacy with [facility] called stating that provider sent in 30 days of patient Zoloft. Per pharmacy they need 90 days supply due to them being a mail order. Per pharmacy they are needing a new script sent back in

## 2022-06-14 ENCOUNTER — Other Ambulatory Visit (HOSPITAL_COMMUNITY): Payer: Self-pay | Admitting: Psychiatry

## 2022-06-14 ENCOUNTER — Telehealth (HOSPITAL_COMMUNITY): Payer: Self-pay

## 2022-06-14 MED ORDER — SERTRALINE HCL 25 MG PO TABS: 25.0000 mg | ORAL_TABLET | Freq: Every day | ORAL | 2 refills | Status: DC

## 2022-06-14 NOTE — Telephone Encounter (Signed)
sent 

## 2022-06-14 NOTE — Telephone Encounter (Signed)
Medication refill -Call with pt's [facility] and then with patient to clarify the 90 day order they are requesting since they cannot fill for 30 days. Pt reported she did not think she would be taking Sertraline but Paxil and requested this order be sent in for 90 days and to let her know if for some reason she is to be taking both.  Agreed to send request to Dr. Harrington Challenger for clarification and for new Paxil order to be sent.

## 2022-06-14 NOTE — Telephone Encounter (Signed)
She is just on Sertraline. I stopped paxil due to interference with Tamoxifen

## 2022-06-14 NOTE — Telephone Encounter (Signed)
Medication management - Telephone call back with patient to make sure she is supposed to only be taking Sertraline 25 mg, one a day now and not any Paxil due to it's interaction with Tamoxifen.  Patient appreciative of call back to clarify and called pt's [facility] to make sure they only had Sertraline down and not sending out any Paxil now for patient as this has been discontinued by Dr. Harrington Challenger.  Pharmacist, Isaias Sakai, verified previous orders for Paxil canceled and they now have only the Sertraline.

## 2022-07-05 ENCOUNTER — Telehealth (HOSPITAL_COMMUNITY): Payer: Self-pay | Admitting: *Deleted

## 2022-07-05 NOTE — Telephone Encounter (Signed)
LMOM

## 2022-07-05 NOTE — Telephone Encounter (Signed)
Called patient to sch f/u appt and [facility]

## 2022-07-11 ENCOUNTER — Ambulatory Visit (INDEPENDENT_AMBULATORY_CARE_PROVIDER_SITE_OTHER): Payer: Commercial Managed Care - PPO | Admitting: Psychiatry

## 2022-07-11 ENCOUNTER — Encounter (HOSPITAL_COMMUNITY): Payer: Self-pay | Admitting: Psychiatry

## 2022-07-11 VITALS — BP 124/79 | HR 118 | Ht 63.0 in | Wt 174.8 lb

## 2022-07-11 DIAGNOSIS — F3341 Major depressive disorder, recurrent, in partial remission: Secondary | ICD-10-CM

## 2022-07-11 MED ORDER — ALPRAZOLAM 0.5 MG PO TABS: 0.5000 mg | ORAL_TABLET | Freq: Two times a day (BID) | ORAL | 1 refills | Status: DC | PRN

## 2022-07-11 MED ORDER — SERTRALINE HCL 25 MG PO TABS: 25.0000 mg | ORAL_TABLET | Freq: Every day | ORAL | 2 refills | Status: DC

## 2022-07-11 NOTE — Progress Notes (Signed)
BH MD/PA/NP OP Progress Note  07/11/2022 3:35 PM Rachel Sutton  MRN:  [id number]  Chief Complaint:  Chief Complaint  Patient presents with   Depression   Anxiety   Follow-up   HPI: This patient is a 61 year old married white female who is currently living in [place].  She works as an Optometrist for [employer]  The patient returns for follow-up after 4 weeks.  Last time she was more depressed because of her marital issues.  Her husband was connecting with women over the Internet and this was really upsetting her.  She thinks he has now stopped and he is paying more attention to her after they had a long discussion.  Because she was more depressed we added Zoloft 25 mg and it seems to be helping.  She denies significant depression thoughts of self-harm or suicidal ideation.  The Xanax continues to help her anxiety.  Her sleep is variable and sometimes she wakes up at night and has a hard time getting to sleep.  She also is on tamoxifen because of the history of breast cancer and this can has caused hot flashes at night. Visit Diagnosis:    ICD-10-CM   1. Recurrent major depressive disorder, in partial remission ([place])  F33.41       Past Psychiatric History: Long-term outpatient treatment  Past Medical History:  Past Medical History:  Diagnosis Date   Anxiety    Arthritis    Breast cancer ([place]) 02/22/2012   Right T1c, N0   C. difficile colitis    Colitis, collagenous 09/22/2015   Depression    Essential hypertension    Family history of brain cancer    Family history of esophageal cancer    Family history of lung cancer    Family history of lymphoma    Family history of pancreatic cancer    Fuchs' corneal dystrophy    GERD (gastroesophageal reflux disease)    Hypertension    Mitral valve prolapse    Mild mitral regurgitation   Mixed hyperlipidemia    Seizures ([place])    Only with dose of haldol; no more since that was stopped.    Past Surgical History:  Procedure  Laterality Date   ABDOMINAL HYSTERECTOMY  1999   BACK SURGERY     BIOPSY N/A 09/28/2015   Procedure: BIOPSY;  Surgeon: Danie Binder, MD;  Location: [facility];  Service: Endoscopy;  Laterality: N/A;   BIOPSY  04/05/2016   Procedure: BIOPSY;  Surgeon: Danie Binder, MD;  [facility];  Service: Endoscopy;;  colon bx   BREAST LUMPECTOMY  10/30/2002   right   BREAST RECONSTRUCTION WITH PLACEMENT OF TISSUE EXPANDER AND FLEX HD (ACELLULAR HYDRATED DERMIS) Right 09/07/2019   Procedure: RIGHT BREAST RECONSTRUCTION WITH PLACEMENT OF TISSUE EXPANDER AND FLEX HD (ACELLULAR HYDRATED DERMIS);  Surgeon: Wallace Going, DO;  Location: [place];  Service: Plastics;  Laterality: Right;   CARDIAC CATHETERIZATION     Carpal tunnel R/L  1992   Cervical spine fusion X2 since last visit     COLONOSCOPY WITH PROPOFOL N/A 09/28/2015   Procedure: COLONOSCOPY WITH PROPOFOL;  Surgeon: Danie Binder, MD;  Location: [facility];  Service: Endoscopy;  Laterality: N/A;  procedure 1 cecum time in 1232  time out 1246   total time 14 mintes   COLONOSCOPY WITH PROPOFOL N/A 04/05/2016   Procedure: COLONOSCOPY WITH PROPOFOL;  Surgeon: Danie Binder, MD;  [facility];  Service: Endoscopy;  Laterality:  N/A;  1200   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   DEBRIDEMENT AND CLOSURE WOUND Right 02/17/2020   Procedure: Excision of right breast wound with closure and provena placement;  Surgeon: Wallace Going, DO;  Location: [facility];  Service: Plastics;  Laterality: Right;   ELBOW SURGERY Right  tennis elbow release 2012   ESOPHAGOGASTRODUODENOSCOPY  10/26/2012   Procedure: ESOPHAGOGASTRODUODENOSCOPY (EGD);  Surgeon: Inda Castle, MD;  Location: [facility];  Service: Endoscopy;  Laterality: N/A;   ESOPHAGOGASTRODUODENOSCOPY (EGD) WITH PROPOFOL N/A 09/28/2015   Procedure: ESOPHAGOGASTRODUODENOSCOPY (EGD) WITH PROPOFOL;  Surgeon: Danie Binder, MD;  Location: [facility];  Service: Endoscopy;   Laterality: N/A;   EYE SURGERY  2018   corneal transplants bilateral   KNEE ARTHROSCOPY Left    LEFT HEART CATHETERIZATION WITH CORONARY ANGIOGRAM N/A 10/23/2012   Procedure: LEFT HEART CATHETERIZATION WITH CORONARY ANGIOGRAM;  Surgeon: Peter M Martinique, MD;  Location: [facility] CATH [facility];  Service: Cardiovascular;  Laterality: N/A;   MASTECTOMY Right    MASTECTOMY W/ SENTINEL NODE BIOPSY Right 09/07/2019   Procedure: RIGHT MASTECTOMY WITH SENTINEL LYMPH NODE BIOPSY;  Surgeon: Jovita Kussmaul, MD;  Location: [place] [facility];  Service: General;  Laterality: Right;   OVARIAN CYST REMOVAL     prior to hysterectomy, right   Ray cage fusion  [place] Right 12/31/2019   Procedure: REMOVAL OF TISSUE EXPANDER AND EXCESS SKIN;  Surgeon: Wallace Going, DO;  Location: [place];  Service: Plastics;  Laterality: Right;  1 hour   SENTINEL LYMPH NODE BIOPSY  10/30/2002   right   TONSILLECTOMY     at six months old    Family Psychiatric History: See below  Family History:  Family History  Problem Relation Age of Onset   Diabetes Mother    Pancreatic cancer Mother 47   Lung cancer Father    Diabetes Father    Heart disease Father    Esophageal cancer Father 48       Beer drinker   Schizophrenia Sister    Cancer Maternal Uncle        unknown form of cancer; cigar smoker   Lung cancer Maternal Grandmother    Brain cancer Maternal Grandfather    Heart disease Paternal Grandfather    Brain cancer Cousin        dx in his late 64s-40s   Brain cancer Brother 34   Lymphoma Niece 32   Anesthesia problems Neg Hx    Hypotension Neg Hx    Malignant hyperthermia Neg Hx    Pseudochol deficiency Neg Hx    Colon cancer Neg Hx    Colon polyps Neg Hx    Crohn's disease Neg Hx    Ulcerative colitis Neg Hx    Breast cancer Neg Hx     Social History:  Social History   Socioeconomic History   Marital status: Married    Spouse name: Not on file   Number of children: 2    Years of education: Not on file   Highest education level: Not on file  Occupational History   Occupation: Aeronautical engineer: [employer]    Comment: [employer]  Tobacco Use   Smoking status: Former    Packs/day: 0.50    Years: 35.00    Total pack years: 17.50    Types: Cigarettes    Start date: 07/07/1974    Quit date: 07/14/2019  Years since quitting: 2.9   Smokeless tobacco: Never  Vaping Use   Vaping Use: Never used  Substance and Sexual Activity   Alcohol use: Not Currently    Alcohol/week: 0.0 standard drinks of alcohol    Comment: stopped July 26, 2014)   Drug use: No   Sexual activity: Not Currently    Birth control/protection: Surgical  Other Topics Concern   Not on file  Social History Narrative   Not on file   Social Determinants of Health   Financial Resource Strain: Not on file  Food Insecurity: Not on file  Transportation Needs: Not on file  Physical Activity: Not on file  Stress: Not on file  Social Connections: Not on file    Allergies:  Allergies  Allergen Reactions   Bee Venom Anaphylaxis   Haloperidol Lactate Other (See Comments)    Convulsions    Meperidine Hcl Hives   Xifaxan [Rifaximin] Diarrhea and Other (See Comments)    Constantly felt the need to use the bathroom     Metabolic Disorder Labs: No results found for: "HGBA1C", "MPG" No results found for: "PROLACTIN" Lab Results  Component Value Date   CHOL 207 (H) 10/23/2012   TRIG 319 (H) 10/23/2012   HDL 33 (L) 10/23/2012   CHOLHDL 6.3 10/23/2012   VLDL 64 (H) 10/23/2012   LDLCALC 110 (H) 10/23/2012   Lab Results  Component Value Date   TSH 0.706 09/22/2015    Therapeutic Level Labs: No results found for: "LITHIUM" No results found for: "VALPROATE" No results found for: "CBMZ"  Current Medications: Current Outpatient Medications  Medication Sig Dispense Refill   acetaminophen (TYLENOL) 500 MG tablet Take 1,000 mg by mouth every 6 (six) hours as needed for moderate  pain or headache.     Calcium-Magnesium-Vitamin D (CALCIUM 500 PO) Take 1 tablet by mouth daily.      diclofenac sodium (VOLTAREN) 1 % GEL Apply 1 application topically 3 (three) times daily as needed (pain).      EPINEPHrine (EPI-PEN) 0.3 mg/0.3 mL DEVI Inject 0.3 mg into the muscle as needed (bee stings).      fish oil-omega-3 fatty acids 1000 MG capsule Take 1 g by mouth every morning.      gabapentin (NEURONTIN) 100 MG capsule TAKE 1 CAPSULE(100 MG) BY MOUTH THREE TIMES DAILY 270 capsule 3   hydrochlorothiazide (MICROZIDE) 12.5 MG capsule TAKE 1 CAPSULE DAILY 90 capsule 3   hydroxychloroquine (PLAQUENIL) 200 MG tablet Take 400 mg by mouth daily.     leflunomide (ARAVA) 20 MG tablet Take 20 mg by mouth daily.     metoprolol succinate (TOPROL-XL) 50 MG 24 hr tablet TAKE 1 TABLET TWICE A DAY  WITH OR IMMEDIATELY        FOLLOWING A MEAL 180 tablet 2   Multiple Vitamin (MULITIVITAMIN WITH MINERALS) TABS Take 1 tablet by mouth every morning.     NIFEdipine (PROCARDIA XL/NIFEDICAL-XL) 90 MG 24 hr tablet TAKE 1 TABLET DAILY 90 tablet 3   nitroGLYCERIN (NITROSTAT) 0.4 MG SL tablet Place 1 tablet (0.4 mg total) under the tongue every 5 (five) minutes x 3 doses as needed for chest pain. 25 tablet 3   pantoprazole (PROTONIX) 40 MG tablet TAKE 1 TABLET DAILY. 90 tablet 3   sulfaSALAzine (AZULFIDINE) 500 MG tablet Take 500 mg by mouth daily.     tamoxifen (NOLVADEX) 20 MG tablet TAKE 1 TABLET DAILY 90 tablet 1   ALPRAZolam (XANAX) 0.5 MG tablet Take 1 tablet (0.5 mg  total) by mouth 2 (two) times daily as needed for anxiety. 180 tablet 1   sertraline (ZOLOFT) 25 MG tablet Take 1 tablet (25 mg total) by mouth daily. 90 tablet 2   Current Facility-Administered Medications  Medication Dose Route Frequency Provider Last Rate Last Admin   betamethasone acetate-betamethasone sodium phosphate (CELESTONE) injection 3 mg  3 mg Intramuscular Once Edrick Kins, DPM         Musculoskeletal: Strength & Muscle  Tone: within normal limits Gait & Station: normal Patient leans: N/A  Psychiatric Specialty Exam: Review of Systems  Psychiatric/Behavioral:  Positive for sleep disturbance.   All other systems reviewed and are negative.   Blood pressure 124/79, pulse (!) 118, height '5\' 3"'$  (1.6 m), weight 174 lb 12.8 oz (79.3 kg).Body mass index is 30.96 kg/m.  General Appearance: Casual and Fairly Groomed  Eye Contact:  Good  Speech:  Clear and Coherent  Volume:  Normal  Mood:  Euthymic  Affect:  Appropriate and Congruent  Thought Process:  Goal Directed  Orientation:  Full (Time, Place, and Person)  Thought Content: WDL   Suicidal Thoughts:  No  Homicidal Thoughts:  No  Memory:  Immediate;   Good Recent;   Good Remote;   Good  Judgement:  Good  Insight:  Good  Psychomotor Activity:  Normal  Concentration:  Concentration: Good and Attention Span: Good  Recall:  Good  Fund of Knowledge: Good  Language: Good  Akathisia:  No  Handed:  Right  AIMS (if indicated): not done  Assets:  Communication Skills Desire for Improvement Resilience Social Support Talents/Skills  ADL's:  Intact  Cognition: WNL  Sleep:  Fair   Screenings: GAD-7    [place] Office Visit from 07/11/2022 in [facility]  Total GAD-7 Score 3      PHQ2-9    [place] Office Visit from 07/11/2022 in [place] Video Visit from 06/07/2022 in [facility] [facility] Video Visit from 12/07/2021 in [place] [facility] Video Visit from 09/07/2021 in [place] [facility] Video Visit from 08/10/2021 in [facility] [facility]  PHQ-2 Total Score 1 0 0 0 1  PHQ-9 Total Score 7 -- -- -- --      [place] Office Visit from 07/11/2022 in [place] [facility] Video Visit  from 06/07/2022 in [place] [facility] Video Visit from 12/07/2021 in [place] No Risk No Risk No Risk        Assessment and Plan: This patient is a 61 year old female with a history of depression and anxiety.  She had recently become somewhat more depressed due to marital issues.  She is feeling better now that she and her husband are communicating more.  The Zoloft 25 mg is also helped and will be continued.  She will continue the Xanax 0.5 mg twice daily for anxiety and sleep.  She will return to see me in 3 months  Collaboration of Care: Collaboration of Care: Primary Care Provider AEB notes will be shared with PCP at patient's request  Patient/Guardian was advised Release of Information must be obtained prior to any record release in order to collaborate their care with an outside provider. Patient/Guardian was advised if they have not already done so to contact the registration department to sign all necessary forms in order for [place] to release information regarding their care.   Consent:  Patient/Guardian gives verbal consent for treatment and assignment of benefits for services provided during this visit. Patient/Guardian expressed understanding and agreed to proceed.    Levonne Spiller, MD 07/11/2022, 3:35 PM

## 2022-10-11 ENCOUNTER — Telehealth (INDEPENDENT_AMBULATORY_CARE_PROVIDER_SITE_OTHER): Payer: Commercial Managed Care - PPO | Admitting: Psychiatry

## 2022-10-11 ENCOUNTER — Encounter (HOSPITAL_COMMUNITY): Payer: Self-pay | Admitting: Psychiatry

## 2022-10-11 ENCOUNTER — Telehealth: Payer: Self-pay | Admitting: Cardiology

## 2022-10-11 DIAGNOSIS — F3341 Major depressive disorder, recurrent, in partial remission: Secondary | ICD-10-CM | POA: Diagnosis not present

## 2022-10-11 MED ORDER — ALPRAZOLAM 0.5 MG PO TABS: 0.5000 mg | ORAL_TABLET | Freq: Two times a day (BID) | ORAL | 1 refills | Status: DC | PRN

## 2022-10-11 MED ORDER — HYDROCHLOROTHIAZIDE 12.5 MG PO CAPS: 12.5000 mg | ORAL_CAPSULE | Freq: Every day | ORAL | 0 refills | Status: DC

## 2022-10-11 MED ORDER — SERTRALINE HCL 25 MG PO TABS: 25.0000 mg | ORAL_TABLET | Freq: Every day | ORAL | 2 refills | Status: DC

## 2022-10-11 NOTE — Telephone Encounter (Signed)
*  STAT* If patient is at the pharmacy, call can be transferred to refill team.   1. Which medications need to be refilled? (please list name of each medication and dose if known)   hydrochlorothiazide (MICROZIDE) 12.5 MG capsule  2. Which pharmacy/location (including street and city if local pharmacy) is medication to be sent to?  [facility], [place] to [facility]istered [facility]  3. Do they need a 30 day or 90 day supply? 90 day  Patient is almost out of this medication.

## 2022-10-11 NOTE — Progress Notes (Signed)
Virtual Visit via Video Note  I connected with Rachel Sutton on 10/11/22 at 11:00 AM EDT by a video enabled telemedicine application and verified that I am speaking with the correct person using two identifiers.  Location: Patient: home Provider: office   I discussed the limitations of evaluation and management by telemedicine and the availability of in person appointments. The patient expressed understanding and agreed to proceed.     I discussed the assessment and treatment plan with the patient. The patient was provided an opportunity to ask questions and all were answered. The patient agreed with the plan and demonstrated an understanding of the instructions.   The patient was advised to call back or seek an in-person evaluation if the symptoms worsen or if the condition fails to improve as anticipated.  I provided 15 minutes of non-face-to-face time during this encounter.   Levonne Spiller, MD  [facility] MD/PA/NP OP Progress Note  10/11/2022 11:07 AM Rachel Sutton  MRN:  [id number]  Chief Complaint:  Chief Complaint  Patient presents with   Anxiety   Depression   Follow-up   HPI: This patient is a 61 year old married white female who is currently living in [place] with her husband.  She works as an Optometrist for [employer].  She is working from home.  The patient returns for follow-up after 3 months.  Few months ago she had gotten more depressed because of marital issues and her husband not paying enough attention to her.  They had a long discussion and he is doing better by her report.  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  She is still working full-time for [employer] as well.  She is staying very busy.  She feels like the Zoloft is really helped with her anxiety and sleep as well as the Xanax.  Her mood has been stable and she denies significant depression thoughts of self-harm or suicide.  She does have some health issues like neuropathy which has  been helped with gabapentin.  She also has rheumatoid arthritis. Visit Diagnosis:    ICD-10-CM   1. Recurrent major depressive disorder, in partial remission ([place])  F33.41       Past Psychiatric History: Long-term outpatient treatment  Past Medical History:  Past Medical History:  Diagnosis Date   Anxiety    Arthritis    Breast cancer ([place]) 02/22/2012   Right T1c, N0   C. difficile colitis    Colitis, collagenous 09/22/2015   Depression    Essential hypertension    Family history of brain cancer    Family history of esophageal cancer    Family history of lung cancer    Family history of lymphoma    Family history of pancreatic cancer    Fuchs' corneal dystrophy    GERD (gastroesophageal reflux disease)    Hypertension    Mitral valve prolapse    Mild mitral regurgitation   Mixed hyperlipidemia    Seizures ([place])    Only with dose of haldol; no more since that was stopped.    Past Surgical History:  Procedure Laterality Date   ABDOMINAL HYSTERECTOMY  1999   BACK SURGERY     BIOPSY N/A 09/28/2015   Procedure: BIOPSY;  Surgeon: Danie Binder, MD;  [facility];  Service: Endoscopy;  Laterality: N/A;   BIOPSY  04/05/2016   Procedure: BIOPSY;  Surgeon: Danie Binder, MD;  [facility];  Service: Endoscopy;;  colon bx   BREAST LUMPECTOMY  10/30/2002   right   BREAST RECONSTRUCTION WITH PLACEMENT OF TISSUE EXPANDER AND FLEX HD (ACELLULAR HYDRATED DERMIS) Right 09/07/2019   Procedure: RIGHT BREAST RECONSTRUCTION WITH PLACEMENT OF TISSUE EXPANDER AND FLEX HD (ACELLULAR HYDRATED DERMIS);  Surgeon: Wallace Going, DO;  Location: [place];  Service: Plastics;  Laterality: Right;   CARDIAC CATHETERIZATION     Carpal tunnel R/L  1992   Cervical spine fusion X2 since last visit     COLONOSCOPY WITH PROPOFOL N/A 09/28/2015   Procedure: COLONOSCOPY WITH PROPOFOL;  Surgeon: Danie Binder, MD;  Location: [facility];  Service: Endoscopy;  Laterality: N/A;  procedure 1 cecum  time in 1232  time out 1246   total time 14 mintes   COLONOSCOPY WITH PROPOFOL N/A 04/05/2016   Procedure: COLONOSCOPY WITH PROPOFOL;  Surgeon: Danie Binder, MD;  [facility];  Service: Endoscopy;  Laterality: N/A;  1200   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   DEBRIDEMENT AND CLOSURE WOUND Right 02/17/2020   Procedure: Excision of right breast wound with closure and provena placement;  Surgeon: Wallace Going, DO;  Location: [place];  Service: Plastics;  Laterality: Right;   ELBOW SURGERY Right  tennis elbow release 2012   ESOPHAGOGASTRODUODENOSCOPY  10/26/2012   Procedure: ESOPHAGOGASTRODUODENOSCOPY (EGD);  Surgeon: Inda Castle, MD;  Location: [place];  Service: Endoscopy;  Laterality: N/A;   ESOPHAGOGASTRODUODENOSCOPY (EGD) WITH PROPOFOL N/A 09/28/2015   Procedure: ESOPHAGOGASTRODUODENOSCOPY (EGD) WITH PROPOFOL;  Surgeon: Danie Binder, MD;  [facility];  Service: Endoscopy;  Laterality: N/A;   EYE SURGERY  2018   corneal transplants bilateral   KNEE ARTHROSCOPY Left    LEFT HEART CATHETERIZATION WITH CORONARY ANGIOGRAM N/A 10/23/2012   Procedure: LEFT HEART CATHETERIZATION WITH CORONARY ANGIOGRAM;  Surgeon: Peter M Martinique, MD;  Location: [facility] C[facility];  Service: Cardiovascular;  Laterality: N/A;   MASTECTOMY Right    MASTECTOMY W/ SENTINEL NODE BIOPSY Right 09/07/2019   Procedure: RIGHT MASTECTOMY WITH SENTINEL LYMPH NODE BIOPSY;  Surgeon: Jovita Kussmaul, MD;  Location: [place];  Service: General;  Laterality: Right;   OVARIAN CYST REMOVAL     prior to hysterectomy, right   Ray cage fusion  [place] Right 12/31/2019   Procedure: REMOVAL OF TISSUE EXPANDER AND EXCESS SKIN;  Surgeon: Wallace Going, DO;  Location: [place];  Service: Plastics;  Laterality: Right;  1 hour   SENTINEL LYMPH NODE BIOPSY  10/30/2002   right   TONSILLECTOMY     at 21 old    Family Psychiatric History: See  below  Family History:  Family History  Problem Relation Age of Onset   Diabetes Mother    Pancreatic cancer Mother 40   Lung cancer Father    Diabetes Father    Heart disease Father    Esophageal cancer Father 39       Beer drinker   Schizophrenia Sister    Cancer Maternal Uncle        unknown form of cancer; cigar smoker   Lung cancer Maternal Grandmother    Brain cancer Maternal Grandfather    Heart disease Paternal Grandfather    Brain cancer Cousin        dx in his late 30s-40s   Brain cancer Brother 34   Lymphoma Niece 1   Anesthesia problems Neg Hx    Hypotension Neg Hx    Malignant hyperthermia Neg  Hx    Pseudochol deficiency Neg Hx    Colon cancer Neg Hx    Colon polyps Neg Hx    Crohn's disease Neg Hx    Ulcerative colitis Neg Hx    Breast cancer Neg Hx     Social History:  Social History   Socioeconomic History   Marital status: Married    Spouse name: Not on file   Number of children: 2   Years of education: Not on file   Highest education level: Not on file  Occupational History   Occupation: Aeronautical engineer: [employer]    Comment: [employer]  Tobacco Use   Smoking status: Former    Packs/day: 0.50    Years: 35.00    Total pack years: 17.50    Types: Cigarettes    Start date: 07/07/1974    Quit date: 07/14/2019    Years since quitting: 3.2   Smokeless tobacco: Never  Vaping Use   Vaping Use: Never used  Substance and Sexual Activity   Alcohol use: Not Currently    Alcohol/week: 0.0 standard drinks of alcohol    Comment: stopped July 26, 2014)   Drug use: No   Sexual activity: Not Currently    Birth control/protection: Surgical  Other Topics Concern   Not on file  Social History Narrative   Not on file   Social Determinants of Health   Financial Resource Strain: Not on file  Food Insecurity: Not on file  Transportation Needs: Not on file  Physical Activity: Not on file  Stress: Not on file  Social Connections: Not on file     Allergies:  Allergies  Allergen Reactions   Bee Venom Anaphylaxis   Haloperidol Lactate Other (See Comments)    Convulsions    Meperidine Hcl Hives   Xifaxan [Rifaximin] Diarrhea and Other (See Comments)    Constantly felt the need to use the bathroom     Metabolic Disorder Labs: No results found for: "HGBA1C", "MPG" No results found for: "PROLACTIN" Lab Results  Component Value Date   CHOL 207 (H) 10/23/2012   TRIG 319 (H) 10/23/2012   HDL 33 (L) 10/23/2012   CHOLHDL 6.3 10/23/2012   VLDL 64 (H) 10/23/2012   LDLCALC 110 (H) 10/23/2012   Lab Results  Component Value Date   TSH 0.706 09/22/2015    Therapeutic Level Labs: No results found for: "LITHIUM" No results found for: "VALPROATE" No results found for: "CBMZ"  Current Medications: Current Outpatient Medications  Medication Sig Dispense Refill   acetaminophen (TYLENOL) 500 MG tablet Take 1,000 mg by mouth every 6 (six) hours as needed for moderate pain or headache.     ALPRAZolam (XANAX) 0.5 MG tablet Take 1 tablet (0.5 mg total) by mouth 2 (two) times daily as needed for anxiety. 180 tablet 1   Calcium-Magnesium-Vitamin D (CALCIUM 500 PO) Take 1 tablet by mouth daily.      diclofenac sodium (VOLTAREN) 1 % GEL Apply 1 application topically 3 (three) times daily as needed (pain).      EPINEPHrine (EPI-PEN) 0.3 mg/0.3 mL DEVI Inject 0.3 mg into the muscle as needed (bee stings).      fish oil-omega-3 fatty acids 1000 MG capsule Take 1 g by mouth every morning.      gabapentin (NEURONTIN) 300 MG capsule Take 300 mg by mouth 3 (three) times daily.     hydrochlorothiazide (MICROZIDE) 12.5 MG capsule Take 1 capsule (12.5 mg total) by mouth daily. 30 capsule 0  hydroxychloroquine (PLAQUENIL) 200 MG tablet Take 400 mg by mouth daily.     leflunomide (ARAVA) 20 MG tablet Take 20 mg by mouth daily.     metoprolol succinate (TOPROL-XL) 50 MG 24 hr tablet TAKE 1 TABLET TWICE A DAY  WITH OR IMMEDIATELY        FOLLOWING A  MEAL 180 tablet 2   Multiple Vitamin (MULITIVITAMIN WITH MINERALS) TABS Take 1 tablet by mouth every morning.     NIFEdipine (PROCARDIA XL/NIFEDICAL-XL) 90 MG 24 hr tablet TAKE 1 TABLET DAILY 90 tablet 3   nitroGLYCERIN (NITROSTAT) 0.4 MG SL tablet Place 1 tablet (0.4 mg total) under the tongue every 5 (five) minutes x 3 doses as needed for chest pain. 25 tablet 3   pantoprazole (PROTONIX) 40 MG tablet TAKE 1 TABLET DAILY. 90 tablet 3   sertraline (ZOLOFT) 25 MG tablet Take 1 tablet (25 mg total) by mouth daily. 90 tablet 2   sulfaSALAzine (AZULFIDINE) 500 MG tablet Take 500 mg by mouth daily.     tamoxifen (NOLVADEX) 20 MG tablet TAKE 1 TABLET DAILY 90 tablet 1   Current Facility-Administered Medications  Medication Dose Route Frequency Provider Last Rate Last Admin   betamethasone acetate-betamethasone sodium phosphate (CELESTONE) injection 3 mg  3 mg Intramuscular Once Edrick Kins, DPM         Musculoskeletal: Strength & Muscle Tone: within normal limits Gait & Station: normal Patient leans: N/A  Psychiatric Specialty Exam: Review of Systems  Musculoskeletal:  Positive for arthralgias and joint swelling.  Neurological:  Positive for numbness.  All other systems reviewed and are negative.   There were no vitals taken for this visit.There is no height or weight on file to calculate BMI.  General Appearance: Casual and Fairly Groomed  Eye Contact:  Good  Speech:  Clear and Coherent  Volume:  Normal  Mood:  Euthymic  Affect:  Appropriate and Congruent  Thought Process:  Goal Directed  Orientation:  Full (Time, Place, and Person)  Thought Content: WDL   Suicidal Thoughts:  No  Homicidal Thoughts:  No  Memory:  Immediate;   Good Recent;   Good Remote;   Fair  Judgement:  Good  Insight:  Good  Psychomotor Activity:  Normal  Concentration:  Concentration: Good and Attention Span: Good  Recall:  Good  Fund of Knowledge: Good  Language: Good  Akathisia:  No  Handed:   Right  AIMS (if indicated): not done  Assets:  Communication Skills Desire for Improvement Resilience Social Support Talents/Skills  ADL's:  Intact  Cognition: WNL  Sleep:  Good   Screenings: GAD-7    Flowsheet Row Office Visit from 07/11/2022 in [facility]  Total GAD-7 Score 3      PHQ2-9    [place] Office Visit from 07/11/2022 in [facility] [place] [facility] Video Visit from 06/07/2022 in [place] [facility] Video Visit from 12/07/2021 in [place] [facility] Video Visit from 09/07/2021 in [place] [facility] Video Visit from 08/10/2021 in [place] [facility]  PHQ-2 Total Score 1 0 0 0 1  PHQ-9 Total Score 7 -- -- -- --      [place] Office Visit from 07/11/2022 in [place] Video Visit from 06/07/2022 in [place] [facility] Video Visit from 12/07/2021 in [place] No Risk No Risk No Risk  Assessment and Plan: This patient is a 61 year old female with a history of depression and anxiety.  She is doing well on her current regimen.  She will continue Zoloft 25 mg daily for depression and anxiety and Xanax 0.5 mg twice daily for anxiety and sleep.  She will return to see me in 6 months  Collaboration of Care: Collaboration of Care: Primary Care Provider AEB notes will be shared with PCP at patient's request  Patient/Guardian was advised Release of Information must be obtained prior to any record release in order to collaborate their care with an outside provider. Patient/Guardian was advised if they have not already done so to contact the registration department to sign all necessary forms in order for [place] to release  information regarding their care.   Consent: Patient/Guardian gives verbal consent for treatment and assignment of benefits for services provided during this visit. Patient/Guardian expressed understanding and agreed to proceed.    Levonne Spiller, MD 10/11/2022, 11:07 AM

## 2022-10-11 NOTE — Telephone Encounter (Signed)
Recall notice sent to pt in June for July 2023 apt. Pt needs apt for refills,messaged scheduling.   Per protocol 30 day supply given

## 2022-10-28 ENCOUNTER — Other Ambulatory Visit: Payer: Self-pay | Admitting: Cardiology

## 2022-11-14 ENCOUNTER — Telehealth: Payer: Self-pay | Admitting: Cardiology

## 2022-11-14 MED ORDER — NIFEDIPINE ER OSMOTIC RELEASE 90 MG PO TB24: 90.0000 mg | ORAL_TABLET | Freq: Every day | ORAL | 1 refills | Status: DC

## 2022-11-14 MED ORDER — HYDROCHLOROTHIAZIDE 12.5 MG PO CAPS: 12.5000 mg | ORAL_CAPSULE | Freq: Every day | ORAL | 1 refills | Status: DC

## 2022-11-14 NOTE — Telephone Encounter (Signed)
*  STAT* If patient is at the pharmacy, call can be transferred to refill team.   1. Which medications need to be refilled? (please list name of each medication and dose if known) NIFEdipine (PROCARDIA XL/NIFEDICAL-XL) 90 MG 24 hr tablet   2. Which pharmacy/location (including street and city if local pharmacy) is medication to be sent to? [facility] [place] [place] to Registered [facility] Sites   3. Do they need a 30 day or 90 d ay supply? [place]

## 2022-11-14 NOTE — Telephone Encounter (Signed)
Completed.

## 2022-11-14 NOTE — Telephone Encounter (Signed)
Pt c/o medication issue:  1. Name of Medication: hydrochlorothiazide (MICROZIDE) 12.5 MG capsule   2. How are you currently taking this medication (dosage and times per day)? As written  3. Are you having a reaction (difficulty breathing--STAT)? No   4. What is your medication issue? Patient needs a new 90 day, 3 refill prescription sent in to [place], [place] to [facility]

## 2022-11-20 ENCOUNTER — Inpatient Hospital Stay: Payer: Commercial Managed Care - PPO | Admitting: Nurse Practitioner

## 2022-11-20 ENCOUNTER — Other Ambulatory Visit: Payer: Self-pay | Admitting: Nurse Practitioner

## 2022-11-20 ENCOUNTER — Other Ambulatory Visit: Payer: Self-pay

## 2022-11-20 ENCOUNTER — Inpatient Hospital Stay: Payer: Commercial Managed Care - PPO | Attending: Nurse Practitioner

## 2022-11-20 ENCOUNTER — Encounter: Payer: Self-pay | Admitting: Nurse Practitioner

## 2022-11-20 VITALS — BP 140/78 | HR 87 | Temp 98.8°F | Resp 16 | Ht 63.0 in | Wt 178.4 lb

## 2022-11-20 DIAGNOSIS — C50411 Malignant neoplasm of upper-outer quadrant of right female breast: Secondary | ICD-10-CM

## 2022-11-20 DIAGNOSIS — Z79810 Long term (current) use of selective estrogen receptor modulators (SERMs): Secondary | ICD-10-CM | POA: Diagnosis not present

## 2022-11-20 DIAGNOSIS — Z170 Estrogen receptor positive status [ER+]: Secondary | ICD-10-CM | POA: Diagnosis not present

## 2022-11-20 DIAGNOSIS — Z79899 Other long term (current) drug therapy: Secondary | ICD-10-CM | POA: Diagnosis not present

## 2022-11-20 DIAGNOSIS — R232 Flushing: Secondary | ICD-10-CM | POA: Diagnosis not present

## 2022-11-20 LAB — CMP (CANCER CENTER ONLY)
ALT: 25 U/L (ref 0–44)
AST: 30 U/L (ref 15–41)
Albumin: 4.2 g/dL (ref 3.5–5.0)
Alkaline Phosphatase: 68 U/L (ref 38–126)
Anion gap: 8 (ref 5–15)
BUN: 10 mg/dL (ref 8–23)
CO2: 28 mmol/L (ref 22–32)
Calcium: 9.6 mg/dL (ref 8.9–10.3)
Chloride: 105 mmol/L (ref 98–111)
Creatinine: 0.69 mg/dL (ref 0.44–1.00)
GFR, Estimated: >60 mL/min (ref >60)
Glucose, Bld: 124 mg/dL — ABNORMAL HIGH (ref 70–99)
Potassium: 4.2 mmol/L (ref 3.5–5.1)
Sodium: 141 mmol/L (ref 135–145)
Total Bilirubin: 0.5 mg/dL (ref 0.3–1.2)
Total Protein: 7.3 g/dL (ref 6.5–8.1)

## 2022-11-20 LAB — CBC WITH DIFFERENTIAL (CANCER CENTER ONLY)
Abs Immature Granulocytes: 0.02 10*3/uL (ref 0.00–0.07)
Basophils Absolute: 0.1 10*3/uL (ref 0.0–0.1)
Basophils Relative: 1 %
Eosinophils Absolute: 0.4 10*3/uL (ref 0.0–0.5)
Eosinophils Relative: 5 %
HCT: 40.2 % (ref 36.0–46.0)
Hemoglobin: 13.7 g/dL (ref 12.0–15.0)
Immature Granulocytes: 0 %
Lymphocytes Relative: 40 %
Lymphs Abs: 3.3 10*3/uL (ref 0.7–4.0)
MCH: 30.6 pg (ref 26.0–34.0)
MCHC: 34.1 g/dL (ref 30.0–36.0)
MCV: 89.9 fL (ref 80.0–100.0)
Monocytes Absolute: 0.9 10*3/uL (ref 0.1–1.0)
Monocytes Relative: 11 %
Neutro Abs: 3.6 10*3/uL (ref 1.7–7.7)
Neutrophils Relative %: 43 %
Platelet Count: 256 10*3/uL (ref 150–400)
RBC: 4.47 MIL/uL (ref 3.87–5.11)
RDW: 13.6 % (ref 11.5–15.5)
WBC Count: 8.3 10*3/uL (ref 4.0–10.5)
nRBC: 0 % (ref 0.0–0.2)

## 2022-11-20 NOTE — Progress Notes (Signed)
Rachel Sutton   [phone]) [phone] [phone]) [phone]   Clinic Follow up Note   Patient Care Team: Janie Morning, DO as PCP - General (Family Medicine) Satira Sark, MD as PCP - Cardiology (Cardiology) Lendon Colonel, NP as Nurse Practitioner (Nurse Practitioner) Mauro Kaufmann, RN as Oncology Nurse Navigator Rockwell Germany, RN as Oncology Nurse Navigator Truitt Merle, MD as Consulting Physician (Hematology) Jovita Kussmaul, MD as Consulting Physician (General Surgery) Gery Pray, MD as Consulting Physician (Radiation Oncology) Lahoma Rocker, MD as Consulting Physician (Rheumatology) Alla Feeling, NP as Nurse Practitioner (Nurse Practitioner) 11/20/2022  CHIEF COMPLAINT: Follow up right breast cancer   SUMMARY OF ONCOLOGIC HISTORY: Oncology History  Malignant neoplasm of upper-outer quadrant of right breast in female, estrogen receptor positive ([place])  07/01/2019 Mammogram   Diagnostic mammogram 07/01/19  IMPRESSION: Suspicious mass in the 9:30 region of the right breast 9 cm from the nipple measuring 1.3 x 1.3 x 1.1 cm. Suspicious mass in the right axilla measuring 5 x 4 x 4 mm.   07/02/2019 Cancer Staging   Staging form: Breast, AJCC 8th Edition - Clinical stage from 07/02/2019: Stage IA (cT1c, cN0, cM0, G2, ER+, PR+, HER2: Equivocal) - Signed by Truitt Merle, MD on 07/07/2019   07/02/2019 Initial Biopsy   Diagnosis 07/02/19 1. Breast, right, needle core biopsy, 9:30 o'clock - INVASIVE DUCTAL CARCINOMA, GRADE 1-2. SEE NOTE - DUCTAL CARCINOMA IN SITU, INTERMEDIATE GRADE 2. Lymph node, needle/core biopsy, right axilla - LYMPH NODE, NEGATIVE FOR CARCINOMA   07/02/2019 Receptors her2   By immunohistochemistry, the tumor cells are EQUIVOCAL for Her2 (2+). HER2 by [place] will be PERFORMED and the RESULTS REPORTED SEPARATELY Estrogen Receptor: 100%, POSITIVE, STRONG STAINING INTENSITY Progesterone Receptor: 90%, POSITIVE, STRONG STAINING  INTENSITY Proliferation Marker Ki67: 10%   07/06/2019 Initial Diagnosis   Malignant neoplasm of upper-outer quadrant of right breast in female, estrogen receptor positive ([place])   07/08/2019 -  Anti-estrogen oral therapy   Anastrozole 57m once daily starting 07/08/19 before surgery.    07/20/2019 Genetic Testing   Negative genetic testing on the [facility] Multi-Cancer Panel. The report date is 07/20/2019.  The Multi-Gene Panel offered by [facility] includes sequencing and/or deletion duplication testing of the following 85 genes: AIP, ALK, APC, ATM, AXIN2,BAP1,  BARD1, BLM, BMPR1A, BRCA1, BRCA2, BRIP1, CASR, CDC73, CDH1, CDK4, CDKN1B, CDKN1C, CDKN2A (p14ARF), CDKN2A (p16INK4a), CEBPA, CHEK2, CTNNA1, DICER1, DIS3L2, EGFR (c.2369C>T, p.Thr790Met variant only), EPCAM (Deletion/duplication testing only), FH, FLCN, GATA2, GPC3, GREM1 (Promoter region deletion/duplication testing only), HOXB13 (c.251G>A, p.Gly84Glu), HRAS, KIT, MAX, MEN1, MET, MITF (c.952G>A, p.Glu318Lys variant only), MLH1, MSH2, MSH3, MSH6, MUTYH, NBN, NF1, NF2, NTHL1, PALB2, PDGFRA, PHOX2B, PMS2, POLD1, POLE, POT1, PRKAR1A, PTCH1, PTEN, RAD50, RAD51C, RAD51D, RB1, RECQL4, RET, RNF43, RUNX1, SDHAF2, SDHA (sequence changes only), SDHB, SDHC, SDHD, SMAD4, SMARCA4, SMARCB1, SMARCE1, STK11, SUFU, TERC, TERT, TMEM127, TP53, TSC1, TSC2, VHL, WRN and WT1.     08/29/2019 Surgery   RIGHT MASTECTOMY WITH SENTINEL LYMPH NODE BIOPSY and RECONSTRUCTION WITH PLACEMENT OF TISSUE EXPANDER AND FLEX HD (ACELLULAR HYDRATED DERMIS) by Dr. TMarlou Starksand Dr DMarla Roe 09/07/19   09/07/2019 Pathology Results   DIAGNOSIS: 09/07/19  A. SENTINEL LYMPH NODE, RIGHT # 1, BIOPSY:  - One lymph node, negative for carcinoma (0/1).   B. SENTINEL LYMPH NODE, RIGHT # 2, BIOPSY:  - One lymph node, negative for carcinoma (0/1).   C. BREAST, RIGHT, MASTECTOMY:  - Invasive ductal carcinoma, 1.8 cm, Nottingham grade 1 of 3.  - Margins  of resection are not involved (Closest margin: 20  mm, deep).  - Ductal carcinoma in situ, focal.  - Biopsy site changes.  - See oncology table.    09/07/2019 Oncotype testing   Oncotype RS: 17, meaning there is 5% distant recurrence risk at 9 years with AI/tamoxifen alone. Indicating <1% chemo benefit    10/28/2019 Cancer Staging   Staging form: Breast, AJCC 8th Edition - Pathologic: Stage IA (pT1c, pN0, cM0, G1, ER+, PR+, HER2-, Oncotype DX score: 17) - Signed by Truitt Merle, MD on 10/28/2019   12/31/2019 Pathology Results   FINAL MICROSCOPIC DIAGNOSIS:  A. SKIN, RIGHT BREAST MASTECTOMY FLAP AND SCAR:  - Skin and subcutaneous tissue with fibrosis, mild inflammation and  focal giant cell reaction consistent with scar.  - No evidence of malignancy.    02/24/2020 Survivorship   SCP delivered by Cira Rue, NP      CURRENT THERAPY: Anastrozole 1 mg daily, starting 10/28/2019, switched to tamoxifen due to joint pain   INTERVAL HISTORY: Rachel Sutton returns for follow up as scheduled. Last seen by me 05/24/22.  She is doing well in her normal state of health except worsening hot flashes.  She takes gabapentin 300 mg 3 times daily for neuropathy.  Otherwise no issues with tamoxifen.  She does self exams and feels a nodule in her right axilla might be getting larger.  There are new breast lump/mass, nipple discharge or inversion, or skin change.  Her other chronic conditions are controlled.  All other systems were reviewed with the patient and are negative.  MEDICAL HISTORY:  Past Medical History:  Diagnosis Date   Anxiety    Arthritis    Breast cancer ([place]) 02/22/2012   Right T1c, N0   C. difficile colitis    Colitis, collagenous 09/22/2015   Depression    Essential hypertension    Family history of brain cancer    Family history of esophageal cancer    Family history of lung cancer    Family history of lymphoma    Family history of pancreatic cancer    Fuchs' corneal dystrophy    GERD (gastroesophageal reflux disease)     Hypertension    Mitral valve prolapse    Mild mitral regurgitation   Mixed hyperlipidemia    Seizures ([place])    Only with dose of haldol; no more since that was stopped.    SURGICAL HISTORY: Past Surgical History:  Procedure Laterality Date   ABDOMINAL HYSTERECTOMY  1999   BACK SURGERY     BIOPSY N/A 09/28/2015   Procedure: BIOPSY;  Surgeon: Danie Binder, MD;  Location: [facility];  Service: Endoscopy;  Laterality: N/A;   BIOPSY  04/05/2016   Procedure: BIOPSY;  Surgeon: Danie Binder, MD;  [facility];  Service: Endoscopy;;  colon bx   BREAST LUMPECTOMY  10/30/2002   right   BREAST RECONSTRUCTION WITH PLACEMENT OF TISSUE EXPANDER AND FLEX HD (ACELLULAR HYDRATED DERMIS) Right 09/07/2019   Procedure: RIGHT BREAST RECONSTRUCTION WITH PLACEMENT OF TISSUE EXPANDER AND FLEX HD (ACELLULAR HYDRATED DERMIS);  Surgeon: Wallace Going, DO;  Location: [place];  Service: Plastics;  Laterality: Right;   CARDIAC CATHETERIZATION     Carpal tunnel R/L  1992   Cervical spine fusion X2 since last visit     COLONOSCOPY WITH PROPOFOL N/A 09/28/2015   Procedure: COLONOSCOPY WITH PROPOFOL;  Surgeon: Danie Binder, MD;  Location: [facility];  Service: Endoscopy;  Laterality: N/A;  procedure 1 cecum time  in 1232  time out 1246   total time 14 mintes   COLONOSCOPY WITH PROPOFOL N/A 04/05/2016   Procedure: COLONOSCOPY WITH PROPOFOL;  Surgeon: Danie Binder, MD;  [facility];  Service: Endoscopy;  Laterality: N/A;  1200   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   DEBRIDEMENT AND CLOSURE WOUND Right 02/17/2020   Procedure: Excision of right breast wound with closure and provena placement;  Surgeon: Wallace Going, DO;  Location: [place];  Service: Plastics;  Laterality: Right;   ELBOW SURGERY Right  tennis elbow release 2012   ESOPHAGOGASTRODUODENOSCOPY  10/26/2012   Procedure: ESOPHAGOGASTRODUODENOSCOPY (EGD);  Surgeon: Inda Castle, MD;  Location: [place];   Service: Endoscopy;  Laterality: N/A;   ESOPHAGOGASTRODUODENOSCOPY (EGD) WITH PROPOFOL N/A 09/28/2015   Procedure: ESOPHAGOGASTRODUODENOSCOPY (EGD) WITH PROPOFOL;  Surgeon: Danie Binder, MD;  Location: [facility];  Service: Endoscopy;  Laterality: N/A;   EYE SURGERY  2018   corneal transplants bilateral   KNEE ARTHROSCOPY Left    LEFT HEART CATHETERIZATION WITH CORONARY ANGIOGRAM N/A 10/23/2012   Procedure: LEFT HEART CATHETERIZATION WITH CORONARY ANGIOGRAM;  Surgeon: Peter M Martinique, MD;  Location: [facility] CATH LAB;  Service: Cardiovascular;  Laterality: N/A;   MASTECTOMY Right    MASTECTOMY W/ SENTINEL NODE BIOPSY Right 09/07/2019   Procedure: RIGHT MASTECTOMY WITH SENTINEL LYMPH NODE BIOPSY;  Surgeon: Jovita Kussmaul, MD;  Location: [place];  Service: General;  Laterality: Right;   OVARIAN CYST REMOVAL     prior to hysterectomy, right   Ray cage fusion  [place] Right 12/31/2019   Procedure: REMOVAL OF TISSUE EXPANDER AND EXCESS SKIN;  Surgeon: Wallace Going, DO;  Location: [place];  Service: Plastics;  Laterality: Right;  1 hour   SENTINEL LYMPH NODE BIOPSY  10/30/2002   right   TONSILLECTOMY     at six months old    I have reviewed the social history and family history with the patient and they are unchanged from previous note.  ALLERGIES:  is allergic to bee venom, haloperidol lactate, meperidine hcl, and xifaxan [rifaximin].  MEDICATIONS:  Current Outpatient Medications  Medication Sig Dispense Refill   acetaminophen (TYLENOL) 500 MG tablet Take 1,000 mg by mouth every 6 (six) hours as needed for moderate pain or headache.     ALPRAZolam (XANAX) 0.5 MG tablet Take 1 tablet (0.5 mg total) by mouth 2 (two) times daily as needed for anxiety. 180 tablet 1   Calcium-Magnesium-Vitamin D (CALCIUM 500 PO) Take 1 tablet by mouth daily.      diclofenac sodium (VOLTAREN) 1 % GEL Apply 1 application topically 3 (three) times daily as needed (pain).       EPINEPHrine (EPI-PEN) 0.3 mg/0.3 mL DEVI Inject 0.3 mg into the muscle as needed (bee stings).      fish oil-omega-3 fatty acids 1000 MG capsule Take 1 g by mouth every morning.      gabapentin (NEURONTIN) 300 MG capsule Take 300 mg by mouth 3 (three) times daily.     hydrochlorothiazide (MICROZIDE) 12.5 MG capsule Take 1 capsule (12.5 mg total) by mouth daily. 90 capsule 1   hydroxychloroquine (PLAQUENIL) 200 MG tablet Take 400 mg by mouth daily.     leflunomide (ARAVA) 20 MG tablet Take 20 mg by mouth daily.     metoprolol succinate (TOPROL-XL) 50 MG 24 hr tablet TAKE 1 TABLET TWICE A DAY  WITH OR IMMEDIATELY  FOLLOWING A MEAL 180 tablet 2   Multiple Vitamin (MULITIVITAMIN WITH MINERALS) TABS Take 1 tablet by mouth every morning.     NIFEdipine (PROCARDIA XL/NIFEDICAL-XL) 90 MG 24 hr tablet Take 1 tablet (90 mg total) by mouth daily. 90 tablet 1   nitroGLYCERIN (NITROSTAT) 0.4 MG SL tablet Place 1 tablet (0.4 mg total) under the tongue every 5 (five) minutes x 3 doses as needed for chest pain. 25 tablet 3   pantoprazole (PROTONIX) 40 MG tablet TAKE 1 TABLET DAILY. 90 tablet 3   sertraline (ZOLOFT) 25 MG tablet Take 1 tablet (25 mg total) by mouth daily. 90 tablet 2   sulfaSALAzine (AZULFIDINE) 500 MG tablet Take 500 mg by mouth daily.     tamoxifen (NOLVADEX) 20 MG tablet TAKE 1 TABLET DAILY 90 tablet 1   Current Facility-Administered Medications  Medication Dose Route Frequency Provider Last Rate Last Admin   betamethasone acetate-betamethasone sodium phosphate (CELESTONE) injection 3 mg  3 mg Intramuscular Once Edrick Kins, DPM        PHYSICAL EXAMINATION: ECOG PERFORMANCE STATUS: 1 - Symptomatic but completely ambulatory  Vitals:   11/20/22 0953  BP: (!) 140/78  Pulse: 87  Resp: 16  Temp: 98.8 F (37.1 C)  SpO2: 98%   Filed Weights   11/20/22 0953  Weight: 178 lb 6.4 oz (80.9 kg)    GENERAL:alert, no distress and comfortable SKIN: No rash EYES: sclera clear NECK:  Without mass LYMPH:  no palpable cervical or supraclavicular lymphadenopathy LUNGS:  normal breathing effort HEART: no lower extremity edema ABDOMEN:abdomen soft, non-tender and normal bowel sounds NEURO: alert & oriented x 3 with fluent speech, no focal motor/sensory deficits Breast exam: S/p right mastectomy, incisions completely healed.  The palpable nodule in the right axilla correlates with scar tissue.  No other palpable mass or nodularity at the right chest wall, left breast, or either axilla that I could appreciate.  LABORATORY DATA:  I have reviewed the data as listed    Latest Ref Rng & Units 11/20/2022    9:30 AM 05/24/2022   10:02 AM 11/23/2021   11:35 AM  CBC  WBC 4.0 - 10.5 K/uL 8.3  8.7  10.7   Hemoglobin 12.0 - 15.0 g/dL 13.7  14.3  13.4   Hematocrit 36.0 - 46.0 % 40.2  41.3  39.8   Platelets 150 - 400 K/uL 256  280  347         Latest Ref Rng & Units 11/20/2022    9:30 AM 05/24/2022   10:02 AM 11/23/2021   11:35 AM  CMP  Glucose 70 - 99 mg/dL 124  181  136   BUN 8 - 23 mg/dL _0 Creatinine 0.44 - 1.00 mg/dL 0.69  0.83  0.77   Sodium 135 - 145 mmol/L 141  140  140   Potassium 3.5 - 5.1 mmol/L 4.2  3.8  3.7   Chloride 98 - 111 mmol/L 105  105  103   CO2 22 - 32 mmol/L _1 Calcium 8.9 - 10.3 mg/dL 9.6  9.6  9.3   Total Protein 6.5 - 8.1 g/dL 7.3  7.4  7.6   Total Bilirubin 0.3 - 1.2 mg/dL 0.5  0.5  0.6   Alkaline Phos 38 - 126 U/L 68  80  106   AST 15 - 41 U/L 30  33  25   ALT 0 - 44 U/L 25  24  20       RADIOGRAPHIC STUDIES: I have personally reviewed the radiological images as listed and agreed with the findings in the report. No results found.   ASSESSMENT & PLAN: KARSTEN VAUGHN is a 61 y.o. female with    1. Malignant neoplasm of upper-outer quadrant of right breast, Stage IA, pT1cN0M0, ER+/PP+, HER2-, Grade 1 -Diagnosed in 08/2019. Given prior right lumpectomy and RT she underwent right mastectomy on 09/07/19. She has a low risk RS of  17 and adjuvant chemotherapy was not recommended. -Node-negative, postmastectomy radiation was not required. She underwent right breast reconstruction on 03/25/80 with wound complications afterward and it was removed.  -She started anastrozole before surgery on 10/28/19.  Goal is for a total of 5-7 years. She is tolerating well with mood swings and arthralgia, ultimately switched to tamoxifen 11/23/2021. -Rachel Sutton is clinically doing well.  Tolerating tamoxifen except worsening hot flashes.  Currently on Gabapentin 300 mg 3 times daily for neuropathy.  We discussed increasing gabapentin, changing/adding antidepressants (currently on sertraline), or trying acupuncture.  She would like to increase gabapentin first to see if this helps. Will try 300 mg AM, noon, and 600 mg qHS -Breast exam is benign, labs are unremarkable.  Overall no clinical concern for breast cancer recurrence. -Continue breast cancer surveillance and tamoxifen. She is scheduled for DEXA later this month and will be due left mammo in 12/2022 -Follow-up in 6 months, or sooner if needed   2. H/o Right breast ductal carcinoma, pT1cN0M0 stage IA, ER-/PR-, HER2 +, Grade 3, Genetic testing was negative for pathogenetic mutations.  -Diagnosed on 10/16/02 managed by Dr. Eston Esters  -Treated with right lumpectomy and SNLB, Adjuvant chemo AC-Docetaxol/Herceptin and RT   3. Osteopenia  -Her 11/2012 DEXA was normal.  -07/2020 DEXA showed osteopenia with lowest T-score -1.1 at left hip. Continue calcium and Vitd, and weightbearing exercise -We reviewed the bone strengthening quality of tamoxifen -Repeat DEXA scheduled 12/14/2022   4. Rheumatoid arthritis, diagnosed in 2018.  -She is seen by Rheumatologist Dr. Kathlene November -She was previously on low dose weekly methotrexate and Cimzia self-injections q2weeks. She stopped after breast cancer diagnosis in 08/2019 -Stable -Continue rheumatology follow-up   5. H/o Mitral Valve, HTN, GERD, Smoking  Cessation -Had cardiac cath in 2013, no stent placed.  -She used to smoke 2 ppd. She has a 30+ year history.  Previously counseled on smoking cessation.    6. Anxiety -She was on Paxil before and currently on Xanax 0.52m -she is followed by Dr. RHarrington Challengerwith behavioral health.   Plan: -Labs reviewed -Continue breast cancer surveillance and tamoxifen -Increase gabapentin for hot flashes, will try 300 mg a.m., 300 mg noon, and 600 mg nightly -Proceed with scheduled DEXA this month -Due left mammo in 12/2022 -Follow-up in 6 months, or sooner if needed   Orders Placed This Encounter  Procedures   MM 3D SCREEN BREAST UNI LEFT    Standing Status:   Future    Standing Expiration Date:   11/21/2023    Order Specific Question:   Reason for Exam (SYMPTOM  OR DIAGNOSIS REQUIRED)    Answer:   h/o R breast cancer s/p mastectomy    Order Specific Question:   [facility]    [facility]  All questions were answered. The patient knows to call the clinic with any problems, questions or concerns. No barriers to learning was detected. I spent 20 minutes counseling the patient face to face. The  total time spent in the appointment was 30 minutes and more than 50% was on counseling and review of test results.     Alla Feeling, NP 11/20/22

## 2022-11-25 ENCOUNTER — Other Ambulatory Visit: Payer: Self-pay | Admitting: Hematology

## 2022-11-27 ENCOUNTER — Telehealth: Payer: Self-pay | Admitting: Internal Medicine

## 2022-11-27 NOTE — Telephone Encounter (Signed)
Patient asked if we could fax all of her records to [facility].... Fax# [phone]

## 2022-12-14 ENCOUNTER — Ambulatory Visit
Admission: RE | Admit: 2022-12-14 | Discharge: 2022-12-14 | Disposition: A | Discharge status: Home / Self Care | Payer: Commercial Managed Care - PPO | Source: Ambulatory Visit | Attending: Nurse Practitioner | Admitting: Nurse Practitioner

## 2022-12-14 DIAGNOSIS — Z170 Estrogen receptor positive status [ER+]: Secondary | ICD-10-CM

## 2022-12-18 ENCOUNTER — Telehealth: Payer: Self-pay | Admitting: Nurse Practitioner

## 2022-12-18 DIAGNOSIS — M810 Age-related osteoporosis without current pathological fracture: Secondary | ICD-10-CM

## 2022-12-18 NOTE — Telephone Encounter (Signed)
Scheduled appointment per 1/2 scheduling message. Patient is aware. 

## 2022-12-18 NOTE — Telephone Encounter (Signed)
I called Ms. Conway to review DEXA, which shows slightly worsened osteopenia in RFN and new measurement in forearm showing osteoporosis, T score -3.0.   She is already on Tamoxifen and maximizes calcium/vit D. I recommend weight bearing exercise. We discussed other meds. I recommend zometa once every 6 months x4 doses. Potential benefit and side effects reviewed. I confirmed no DDIs with her RA regimen. She also plans to do her own research but agrees to proceed.   She is in the process of finding a new dentist. She will let us know who she will see, so we can send clearance form. Plan to start at next f/up in 05/2023.   She understands and agrees with the plan and had no other concerns.   Cira Rue, NP

## 2022-12-20 ENCOUNTER — Encounter: Payer: Self-pay | Admitting: Nurse Practitioner

## 2022-12-21 ENCOUNTER — Other Ambulatory Visit: Payer: Self-pay

## 2023-01-11 ENCOUNTER — Telehealth: Payer: Self-pay

## 2023-01-11 ENCOUNTER — Other Ambulatory Visit: Payer: Self-pay

## 2023-01-11 NOTE — Telephone Encounter (Signed)
Spoke with [name] with [name] [phone] regarding pt's MyChart message regarding dental clearance.  Spoke with [name] and the Dentist [name] regarding pt's dental clearance.  Pt is having 2 crowns placed in February 2024 and pt told [name] that she's currently taking chemotherapy.  Informed both [name] and [name] that the pt is not taking chemotherapy but is taking AI therapy (Tamoxifen) and will start Zometa in June 2024.  [name] stated the pt will be good to have the crowns placed in February 2024.  [name] will fax Rachel Sutton's office the official Dental Clearance form for her crown placement.  Also, faxed [name] office the dental clearance form to start Zometa in June 2024.  Awaiting fax from [name] office.

## 2023-01-15 NOTE — Progress Notes (Unsigned)
Cardiology Office Note  Date: 01/16/2023   ID: Rachel Sutton, DOB 01-05-1961, MRN [id number]  PCP:  [facility], [facility]  Cardiologist:  Rozann Lesches, MD Electrophysiologist:  None   Chief Complaint  Patient presents with   Cardiac follow-up    History of Present Illness: Rachel Sutton is a 62 y.o. female last seen in July 2022.  She is here for a routine visit.  Reports no chest pain or interval palpitations, no sudden dizziness or syncope.  She reports diagnosis of OSA and peripheral neuropathy since I saw her.  Continues to follow with PCP.  I reviewed her medications which are outlined below and stable from a cardiac perspective.  ECG today shows sinus rhythm with left atrial enlargement.  Last screening echocardiogram was in June 2022 at which point LVEF was 60 to 65%, mildly dilated left atrium, no clear evidence of mitral valve prolapse or significant mitral regurgitation.  Past Medical History:  Diagnosis Date   Anxiety    Arthritis    Breast cancer ([place]) 02/22/2012   Right T1c, N0   C. difficile colitis    Colitis, collagenous 09/22/2015   Depression    Essential hypertension    Family history of brain cancer    Family history of esophageal cancer    Family history of lung cancer    Family history of lymphoma    Family history of pancreatic cancer    Fuchs' corneal dystrophy    GERD (gastroesophageal reflux disease)    Hypertension    Mitral valve prolapse    Mild mitral regurgitation   Mixed hyperlipidemia    OSA (obstructive sleep apnea)    Peripheral neuropathy    Seizures ([place])    Only with dose of haldol; no more since that was stopped.    Current Outpatient Medications  Medication Sig Dispense Refill   acetaminophen (TYLENOL) 500 MG tablet Take 1,000 mg by mouth every 6 (six) hours as needed for moderate pain or headache.     ALPRAZolam (XANAX) 0.5 MG tablet Take 1 tablet (0.5 mg total) by mouth 2 (two) times daily as needed for  anxiety. 180 tablet 1   Calcium-Magnesium-Vitamin D (CALCIUM 500 PO) Take 1 tablet by mouth daily.      Dexlansoprazole 30 MG capsule DR Take 1 capsule by mouth daily.     diclofenac sodium (VOLTAREN) 1 % GEL Apply 1 application topically 3 (three) times daily as needed (pain).      EPINEPHrine (EPI-PEN) 0.3 mg/0.3 mL DEVI Inject 0.3 mg into the muscle as needed (bee stings).      fish oil-omega-3 fatty acids 1000 MG capsule Take 1 g by mouth every morning.      gabapentin (NEURONTIN) 300 MG capsule Take 300 mg by mouth 3 (three) times daily.     hydrochlorothiazide (MICROZIDE) 12.5 MG capsule Take 1 capsule (12.5 mg total) by mouth daily. 90 capsule 1   hydroxychloroquine (PLAQUENIL) 200 MG tablet Take 400 mg by mouth daily.     leflunomide (ARAVA) 20 MG tablet Take 20 mg by mouth daily.     metoprolol succinate (TOPROL-XL) 50 MG 24 hr tablet TAKE 1 TABLET TWICE A DAY  WITH OR IMMEDIATELY        FOLLOWING A MEAL 180 tablet 2   Multiple Vitamin (MULITIVITAMIN WITH MINERALS) TABS Take 1 tablet by mouth every morning.     NIFEdipine (PROCARDIA XL/NIFEDICAL-XL) 90 MG 24 hr tablet Take 1 tablet (90 mg total) by  mouth daily. 90 tablet 1   nitroGLYCERIN (NITROSTAT) 0.4 MG SL tablet Place 1 tablet (0.4 mg total) under the tongue every 5 (five) minutes x 3 doses as needed for chest pain. 25 tablet 3   pantoprazole (PROTONIX) 40 MG tablet TAKE 1 TABLET DAILY. 90 tablet 3   pravastatin (PRAVACHOL) 20 MG tablet Take 20 mg by mouth daily.     predniSONE (DELTASONE) 10 MG tablet Take 10 mg by mouth. As needed for gout flare up     sertraline (ZOLOFT) 25 MG tablet Take 1 tablet (25 mg total) by mouth daily. 90 tablet 2   sulfaSALAzine (AZULFIDINE) 500 MG tablet Take 500 mg by mouth daily.     tamoxifen (NOLVADEX) 20 MG tablet TAKE 1 TABLET DAILY 90 tablet 1   Current Facility-Administered Medications  Medication Dose Route Frequency Provider Last Rate Last Admin   betamethasone acetate-betamethasone sodium  phosphate (CELESTONE) injection 3 mg  3 mg Intramuscular Once Edrick Kins, DPM       Allergies:  Bee venom, Haloperidol lactate, Meperidine hcl, and Xifaxan [rifaximin]   ROS: No orthopnea or PND.  Physical Exam: VS:  BP 138/88   Pulse 95   Ht '5\' 3"'$  (1.6 m)   Wt 184 lb (83.5 kg)   BMI 32.59 kg/m , BMI Body mass index is 32.59 kg/m.  Wt Readings from Last 3 Encounters:  01/16/23 184 lb (83.5 kg)  11/20/22 178 lb 6.4 oz (80.9 kg)  05/24/22 181 lb 4.8 oz (82.2 kg)    General: Patient appears comfortable at rest. HEENT: Conjunctiva and lids normal Neck: Supple, no elevated JVP or carotid bruits. Lungs: Clear to auscultation, nonlabored breathing at rest. Cardiac: Regular rate and rhythm, no S3 or significant systolic murmur. Extremities: No pitting edema.  ECG:  An ECG dated 07/04/2021 was personally reviewed today and demonstrated:  Sinus rhythm with left atrial enlargement.  Recent Labwork: 11/20/2022: ALT 25; AST 30; BUN 10; Creatinine 0.69; Hemoglobin 13.7; Platelet Count 256; Potassium 4.2; Sodium 141   Other Studies Reviewed Today:  Echocardiogram 05/17/2021:  1. Left ventricular ejection fraction, by estimation, is 60 to 65%. The  left ventricle has normal function. The left ventricle has no regional  wall motion abnormalities. Left ventricular diastolic parameters are  consistent with Grade I diastolic  dysfunction (impaired relaxation).   2. Right ventricular systolic function is normal. The right ventricular  size is normal.   3. Left atrial size was mildly dilated.   4. The mitral valve is normal in structure. No evidence of mitral valve  regurgitation. No evidence of mitral stenosis.   5. The aortic valve is tricuspid. Aortic valve regurgitation is not  visualized. No aortic stenosis is present.   6. The inferior vena cava is normal in size with greater than 50%  respiratory variability, suggesting right atrial pressure of 3 mmHg.   Assessment and Plan:  1.   Essential hypertension, currently on Toprol-XL, nifedipine, and HCTZ with follow-up by PCP.  No changes were made today.  I did review her most recent lab work showing normal renal function and potassium.  2.  History of mitral valve prolapse, although with last echocardiogram in 2022 not demonstrating significant valvular abnormalities.  No change on examination, continue to observe.  Medication Adjustments/Labs and Tests Ordered: Current medicines are reviewed at length with the patient today.  Concerns regarding medicines are outlined above.   Tests Ordered: Orders Placed This Encounter  Procedures   EKG 12-Lead  Medication Changes: No orders of the defined types were placed in this encounter.   Disposition:  Follow up  1 year.  Signed, Satira Sark, MD, [facility] 01/16/2023 11:41 AM    [facility]. [address], [place], [place] [address] Phone: [phone]; Fax: [phone]

## 2023-01-16 ENCOUNTER — Ambulatory Visit: Payer: Commercial Managed Care - PPO | Attending: Cardiology | Admitting: Cardiology

## 2023-01-16 ENCOUNTER — Encounter: Payer: Self-pay | Admitting: Cardiology

## 2023-01-16 VITALS — BP 138/88 | HR 95 | Ht 63.0 in | Wt 184.0 lb

## 2023-01-16 DIAGNOSIS — I10 Essential (primary) hypertension: Secondary | ICD-10-CM | POA: Diagnosis not present

## 2023-01-16 DIAGNOSIS — I341 Nonrheumatic mitral (valve) prolapse: Secondary | ICD-10-CM | POA: Diagnosis not present

## 2023-01-16 NOTE — Patient Instructions (Signed)
Medication Instructions:  Your physician recommends that you continue on your current medications as directed. Please refer to the Current Medication list given to you today.   Labwork: None  Testing/Procedures: None  Follow-Up: Follow up with Dr. Domenic Polite in 1 year.   Any Other Special Instructions Will Be Listed Below (If Applicable).     If you need a refill on your cardiac medications before your next appointment, please call your pharmacy.

## 2023-01-21 ENCOUNTER — Ambulatory Visit: Payer: Commercial Managed Care - PPO

## 2023-01-25 ENCOUNTER — Telehealth: Payer: Self-pay

## 2023-01-25 NOTE — Telephone Encounter (Signed)
Dental Clearance Form was faxed to office today and returned signed. Sent to be scanned into the patients chart.

## 2023-01-27 ENCOUNTER — Encounter: Payer: Self-pay | Admitting: Nurse Practitioner

## 2023-01-29 ENCOUNTER — Ambulatory Visit (INDEPENDENT_AMBULATORY_CARE_PROVIDER_SITE_OTHER): Payer: Commercial Managed Care - PPO | Admitting: Internal Medicine

## 2023-01-29 VITALS — BP 146/87 | HR 82 | Resp 16 | Ht 63.0 in | Wt 186.1 lb

## 2023-01-29 DIAGNOSIS — Z170 Estrogen receptor positive status [ER+]: Secondary | ICD-10-CM

## 2023-01-29 DIAGNOSIS — I10 Essential (primary) hypertension: Secondary | ICD-10-CM | POA: Diagnosis not present

## 2023-01-29 DIAGNOSIS — I341 Nonrheumatic mitral (valve) prolapse: Secondary | ICD-10-CM | POA: Diagnosis not present

## 2023-01-29 DIAGNOSIS — C50411 Malignant neoplasm of upper-outer quadrant of right female breast: Secondary | ICD-10-CM

## 2023-01-29 DIAGNOSIS — F32A Depression, unspecified: Secondary | ICD-10-CM

## 2023-01-29 DIAGNOSIS — G4733 Obstructive sleep apnea (adult) (pediatric): Secondary | ICD-10-CM | POA: Diagnosis not present

## 2023-01-29 DIAGNOSIS — F419 Anxiety disorder, unspecified: Secondary | ICD-10-CM

## 2023-01-29 DIAGNOSIS — Z7189 Other specified counseling: Secondary | ICD-10-CM

## 2023-01-29 DIAGNOSIS — E669 Obesity, unspecified: Secondary | ICD-10-CM

## 2023-01-29 NOTE — Patient Instructions (Signed)

## 2023-01-29 NOTE — Progress Notes (Signed)
[facility] [place], [place] [address]  Pulmonary Sleep Medicine   Office Visit Note  Patient Name: Rachel Sutton DOB: 24-Feb-1961 MRN [id number]    Chief Complaint: Obstructive Sleep Apnea visit  Brief History:  Rachel Sutton is seen today for an initial sleep consult on APAP 7-17cmh20.  The patient has a 7 month  history of sleep apnea. Prior to APAP use patient reports Patient is using PAP nightly.  The patient feels rested after sleeping with PAP.  The patient reports benefit from PAP use. Reported sleepiness is  improved and the Epworth Sleepiness Score is 5 out of 24. The patient does  take  an occasional nap for 30 min once a week.. The patient complains of the following: no complaints at this time.  The compliance download shows  80% compliance with an average use time of 5 hours 44 min The AHI is 1.3  The patient doe snot complain of limb movements disrupting sleep. Patient reports stomach issues at night that causes her to use CPAP less time than optimal.  ROS  General: (-) fever, (-) chills, (-) night sweat Nose and Sinuses: (-) nasal stuffiness or itchiness, (-) postnasal drip, (-) nosebleeds, (-) sinus trouble. Mouth and Throat: (-) sore throat, (-) hoarseness. Neck: (-) swollen glands, (-) enlarged thyroid, (-) neck pain. Respiratory: - cough, - shortness of breath, - wheezing. Neurologic: + numbness, + tingling. Psychiatric: + anxiety, + depression   Current Medication: Outpatient Encounter Medications as of 01/29/2023  Medication Sig   acetaminophen (TYLENOL) 500 MG tablet Take 1,000 mg by mouth every 6 (six) hours as needed for moderate pain or headache.   ALPRAZolam (XANAX) 0.5 MG tablet Take 1 tablet (0.5 mg total) by mouth 2 (two) times daily as needed for anxiety.   Calcium-Magnesium-Vitamin D (CALCIUM 500 PO) Take 1 tablet by mouth daily.    Dexlansoprazole 30 MG capsule DR Take 1 capsule by mouth daily.   diclofenac sodium (VOLTAREN) 1  % GEL Apply 1 application topically 3 (three) times daily as needed (pain).    EPINEPHrine (EPI-PEN) 0.3 mg/0.3 mL DEVI Inject 0.3 mg into the muscle as needed (bee stings).    fish oil-omega-3 fatty acids 1000 MG capsule Take 1 g by mouth every morning.    gabapentin (NEURONTIN) 300 MG capsule Take 300 mg by mouth 3 (three) times daily.   hydrochlorothiazide (MICROZIDE) 12.5 MG capsule Take 1 capsule (12.5 mg total) by mouth daily.   hydroxychloroquine (PLAQUENIL) 200 MG tablet Take 400 mg by mouth daily.   leflunomide (ARAVA) 20 MG tablet Take 20 mg by mouth daily.   metoprolol succinate (TOPROL-XL) 50 MG 24 hr tablet TAKE 1 TABLET TWICE A DAY  WITH OR IMMEDIATELY        FOLLOWING A MEAL   Multiple Vitamin (MULITIVITAMIN WITH MINERALS) TABS Take 1 tablet by mouth every morning.   NIFEdipine (PROCARDIA XL/NIFEDICAL-XL) 90 MG 24 hr tablet Take 1 tablet (90 mg total) by mouth daily.   nitroGLYCERIN (NITROSTAT) 0.4 MG SL tablet Place 1 tablet (0.4 mg total) under the tongue every 5 (five) minutes x 3 doses as needed for chest pain.   pantoprazole (PROTONIX) 40 MG tablet TAKE 1 TABLET DAILY.   pravastatin (PRAVACHOL) 20 MG tablet Take 20 mg by mouth daily.   predniSONE (DELTASONE) 10 MG tablet Take 10 mg by mouth. As needed for gout flare up   sertraline (ZOLOFT) 25 MG tablet Take 1 tablet (25 mg total) by mouth daily.  sulfaSALAzine (AZULFIDINE) 500 MG tablet Take 500 mg by mouth daily.   tamoxifen (NOLVADEX) 20 MG tablet TAKE 1 TABLET DAILY   Facility-Administered Encounter Medications as of 01/29/2023  Medication   betamethasone acetate-betamethasone sodium phosphate (CELESTONE) injection 3 mg    Surgical History: Past Surgical History:  Procedure Laterality Date   ABDOMINAL HYSTERECTOMY  1999   BACK SURGERY     BIOPSY N/A 09/28/2015   Procedure: BIOPSY;  Surgeon: Danie Binder, MD;  Location: [facility];  Service: Endoscopy;  Laterality: N/A;   BIOPSY  04/05/2016   Procedure: BIOPSY;   Surgeon: Danie Binder, MD;  [facility];  Service: Endoscopy;;  colon bx   BREAST LUMPECTOMY  10/30/2002   right   BREAST RECONSTRUCTION WITH PLACEMENT OF TISSUE EXPANDER AND FLEX HD (ACELLULAR HYDRATED DERMIS) Right 09/07/2019   Procedure: RIGHT BREAST RECONSTRUCTION WITH PLACEMENT OF TISSUE EXPANDER AND FLEX HD (ACELLULAR HYDRATED DERMIS);  Surgeon: Wallace Going, DO;  Location: [place];  Service: Plastics;  Laterality: Right;   CARDIAC CATHETERIZATION     Carpal tunnel R/L  1992   Cervical spine fusion X2 since last visit     COLONOSCOPY WITH PROPOFOL N/A 09/28/2015   Procedure: COLONOSCOPY WITH PROPOFOL;  Surgeon: Danie Binder, MD;  Location: [facility];  Service: Endoscopy;  Laterality: N/A;  procedure 1 cecum time in 1232  time out 1246   total time 14 mintes   COLONOSCOPY WITH PROPOFOL N/A 04/05/2016   Procedure: COLONOSCOPY WITH PROPOFOL;  Surgeon: Danie Binder, MD;  [facility];  Service: Endoscopy;  Laterality: N/A;  1200   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   DEBRIDEMENT AND CLOSURE WOUND Right 02/17/2020   Procedure: Excision of right breast wound with closure and provena placement;  Surgeon: Wallace Going, DO;  Location: [place];  Service: Plastics;  Laterality: Right;   ELBOW SURGERY Right  tennis elbow release 2012   ESOPHAGOGASTRODUODENOSCOPY  10/26/2012   Procedure: ESOPHAGOGASTRODUODENOSCOPY (EGD);  Surgeon: Inda Castle, MD;  Location: [place];  Service: Endoscopy;  Laterality: N/A;   ESOPHAGOGASTRODUODENOSCOPY (EGD) WITH PROPOFOL N/A 09/28/2015   Procedure: ESOPHAGOGASTRODUODENOSCOPY (EGD) WITH PROPOFOL;  Surgeon: Danie Binder, MD;  Location: [facility];  Service: Endoscopy;  Laterality: N/A;   EYE SURGERY  2018   corneal transplants bilateral   KNEE ARTHROSCOPY Left    LEFT HEART CATHETERIZATION WITH CORONARY ANGIOGRAM N/A 10/23/2012   Procedure: LEFT HEART CATHETERIZATION WITH CORONARY ANGIOGRAM;  Surgeon:  Peter M Martinique, MD;  Location: [facility] CATH LAB;  Service: Cardiovascular;  Laterality: N/A;   MASTECTOMY Right    MASTECTOMY W/ SENTINEL NODE BIOPSY Right 09/07/2019   Procedure: RIGHT MASTECTOMY WITH SENTINEL LYMPH NODE BIOPSY;  Surgeon: Jovita Kussmaul, MD;  Location: [place];  Service: General;  Laterality: Right;   OVARIAN CYST REMOVAL     prior to hysterectomy, right   Ray cage fusion  [place] Right 12/31/2019   Procedure: REMOVAL OF TISSUE EXPANDER AND EXCESS SKIN;  Surgeon: Wallace Going, DO;  Location: [place];  Service: Plastics;  Laterality: Right;  1 hour   SENTINEL LYMPH NODE BIOPSY  10/30/2002   right   TONSILLECTOMY     at 75 old    Medical History: Past Medical History:  Diagnosis Date   Anxiety    Arthritis    Breast cancer ([place]) 02/22/2012   Right T1c, N0  C. difficile colitis    Colitis, collagenous 09/22/2015   Depression    Essential hypertension    Family history of brain cancer    Family history of esophageal cancer    Family history of lung cancer    Family history of lymphoma    Family history of pancreatic cancer    Fuchs' corneal dystrophy    GERD (gastroesophageal reflux disease)    Hypertension    Mitral valve prolapse    Mild mitral regurgitation   Mixed hyperlipidemia    OSA (obstructive sleep apnea)    Peripheral neuropathy    Seizures ([place])    Only with dose of haldol; no more since that was stopped.    Family History: Non contributory to the present illness  Social History: Social History   Socioeconomic History   Marital status: Married    Spouse name: Not on file   Number of children: 2   Years of education: Not on file   Highest education level: Not on file  Occupational History   Occupation: Aeronautical engineer: [employer]    Comment: [employer]  Tobacco Use   Smoking status: Every Day    Packs/day: 0.30    Years: 35.00    Total pack years: 10.50    Types: Cigarettes     Start date: 07/07/1974    Last attempt to quit: 07/14/2019    Years since quitting: 3.5   Smokeless tobacco: Never  Vaping Use   Vaping Use: Never used  Substance and Sexual Activity   Alcohol use: Not Currently    Alcohol/week: 0.0 standard drinks of alcohol    Comment: stopped July 26, 2014)   Drug use: No   Sexual activity: Not Currently    Birth control/protection: Surgical  Other Topics Concern   Not on file  Social History Narrative   Not on file   Social Determinants of Health   Financial Resource Strain: Not on file  Food Insecurity: Not on file  Transportation Needs: Not on file  Physical Activity: Not on file  Stress: Not on file  Social Connections: Not on file  Intimate Partner Violence: Not on file    Vital Signs: Blood pressure (!) 146/87, pulse 82, resp. rate 16, height 5' 3"$  (1.6 m), weight 186 lb 1.6 oz (84.4 kg), SpO2 94 %. Body mass index is 32.97 kg/m.    Examination: General Appearance: The patient is well-developed, well-nourished, and in no distress. Neck Circumference: 39.5 Skin: Gross inspection of skin unremarkable. Head: normocephalic, no gross deformities. Eyes: no gross deformities noted. ENT: ears appear grossly normal Neurologic: Alert and oriented. No involuntary movements.  STOP BANG RISK ASSESSMENT S (snore) Have you been told that you snore?     NO   T (tired) Are you often tired, fatigued, or sleepy during the day?   YES  O (obstruction) Do you stop breathing, choke, or gasp during sleep? NO   P (pressure) Do you have or are you being treated for high blood pressure? YES   B (BMI) Is your body index greater than 35 kg/m? NO   A (age) Are you 54 years old or older? YES   N (neck) Do you have a neck circumference greater than 16 inches?   NO   G (gender) Are you a female? NO   TOTAL STOP/BANG "YES" ANSWERS 3       A STOP-Bang score of 2 or less is considered low risk, and a score of 5 or  more is high risk for having  either moderate or severe OSA. For people who score 3 or 4, doctors may need to perform further assessment to determine how likely they are to have OSA.         EPWORTH SLEEPINESS SCALE:  Scale:  (0)= no chance of dozing; (1)= slight chance of dozing; (2)= moderate chance of dozing; (3)= high chance of dozing  Chance  Situtation    Sitting and reading: 2    Watching TV: 1    Sitting Inactive in public: 0    As a passenger in car: 0      Lying down to rest: 2    Sitting and talking: 0    Sitting quielty after lunch: 0    In a car, stopped in traffic: 0   TOTAL SCORE:   5 out of 24    SLEEP STUDIES:   HST (06/2022) AHI 15/hr, min Spo2 75% Titration (07/2022) APAP@ 7-17 cmH2O   CPAP COMPLIANCE DATA:  Date Range: 10/30/2022-01/27/2023  Average Daily Use: 5 hours 44 min  Median Use: 6 hours  Compliance for > 4 Hours: 80% days  AHI: 1.3 respiratory events per hour  Days Used: 80/90  Mask Leak: 8.5  95th Percentile Pressure: 10.7         LABS: Recent Results (from the past 2160 hour(s))  CMP ([place] only)     Status: Abnormal   Collection Time: 11/20/22  9:30 AM  Result Value Ref Range   Sodium 141 135 - 145 mmol/L   Potassium 4.2 3.5 - 5.1 mmol/L   Chloride 105 98 - 111 mmol/L   CO2 28 22 - 32 mmol/L   Glucose, Bld 124 (H) 70 - 99 mg/dL    Comment: Glucose reference range applies only to samples taken after fasting for at least 8 hours.   BUN 10 8 - 23 mg/dL   Creatinine 0.69 0.44 - 1.00 mg/dL   Calcium 9.6 8.9 - 10.3 mg/dL   Total Protein 7.3 6.5 - 8.1 g/dL   Albumin 4.2 3.5 - 5.0 g/dL   AST 30 15 - 41 U/L   ALT 25 0 - 44 U/L   Alkaline Phosphatase 68 38 - 126 U/L   Total Bilirubin 0.5 0.3 - 1.2 mg/dL   GFR, Estimated >60 >60 mL/min    Comment: (NOTE) Calculated using the CKD-EPI Creatinine Equation (2021)    Anion gap 8 5 - 15    Comment: Performed at [facility], [place] [address] [place], [place]  [address]  CBC with Differential ([place] Only)     Status: None   Collection Time: 11/20/22  9:30 AM  Result Value Ref Range   WBC Count 8.3 4.0 - 10.5 K/uL   RBC 4.47 3.87 - 5.11 MIL/uL   Hemoglobin 13.7 12.0 - 15.0 g/dL   HCT 40.2 36.0 - 46.0 %   MCV 89.9 80.0 - 100.0 fL   MCH 30.6 26.0 - 34.0 pg   MCHC 34.1 30.0 - 36.0 g/dL   RDW 13.6 11.5 - 15.5 %   Platelet Count 256 150 - 400 K/uL   nRBC 0.0 0.0 - 0.2 %   Neutrophils Relative % 43 %   Neutro Abs 3.6 1.7 - 7.7 K/uL   Lymphocytes Relative 40 %   Lymphs Abs 3.3 0.7 - 4.0 K/uL   Monocytes Relative 11 %   Monocytes Absolute 0.9 0.1 - 1.0 K/uL   Eosinophils Relative 5 %  Eosinophils Absolute 0.4 0.0 - 0.5 K/uL   Basophils Relative 1 %   Basophils Absolute 0.1 0.0 - 0.1 K/uL   Immature Granulocytes 0 %   Abs Immature Granulocytes 0.02 0.00 - 0.07 K/uL    Comment: Performed at [facility], [place] [address] [place], [place] [address]    Radiology: DG Bone Density  Result Date: 12/14/2022 EXAM: DUAL X-RAY ABSORPTIOMETRY (DXA) FOR BONE MINERAL DENSITY IMPRESSION: Referring Physician:  Alla Feeling Your patient completed a bone mineral density test using GE Lunar iDXA system (analysis version: 16). Technologist: sec PATIENT: Name: Nolita, Facey Patient ID: [id number] Birth Date: 25-May-1961 Height: 63.0 in. Sex: Female Measured: 12/14/2022 Weight: 182.0 lbs. Indications: Bilateral Ovariectomy (65.51), Breast Cancer History, Caucasian, Estrogen Deficient, Hysterectomy, LSpine Surgery, Postmenopausal, Rheumatoid Arthritis (714.0), Tobacco User (Current Smoker) Fractures: Finger, Toe Treatments: Calcium (E943.0), Vitamin D (E933.5) ASSESSMENT: The BMD measured at Forearm Radius 33% is 0.613 g/cm2 with a T-score of -3.0. This patient is considered osteoporotic according to [facility]) criteria. The quality of the exam is good. The lumbar spine was excluded due to degenerative and surgical  changes. Site Region Measured Date Measured Age YA BMD Significant CHANGE T-score Left Forearm Radius 33% 12/14/2022 61.4 -3.0 0.613 g/cm2 DualFemur Neck Right 12/14/2022 61.4 -1.4 0.847 g/cm2 * DualFemur Neck Right 08/15/2020 59.1 -0.9 0.913 g/cm2 DualFemur Total Mean 12/14/2022 61.4 -1.0 0.887 g/cm2 DualFemur Total Mean 08/15/2020 59.1 -0.9 0.897 g/cm2 World Health Organization [facility]) criteria for post-menopausal, Caucasian Women: Normal       T-score at or above -1 SD Osteopenia   T-score between -1 and -2.5 SD Osteoporosis T-score at or below -2.5 SD RECOMMENDATION: 1. All patients should optimize calcium and vitamin D intake. 2. Consider FDA-approved medical therapies in postmenopausal women and men aged 65 years and older, based on the following: a. A hip or vertebral (clinical or morphometric) fracture. b. T-score = -2.5 at the femoral neck or spine after appropriate evaluation to exclude secondary causes. c. Low bone mass (T-score between -1.0 and -2.5 at the femoral neck or spine) and a 10-year probability of a hip fracture = 3% or a 10-year probability of a major osteoporosis-related fracture = 20% based on the [place]-adapted WHO algorithm. d. Clinician judgment and/or patient preferences may indicate treatment for people with 10-year fracture probabilities above or below these levels. FOLLOW-UP: Patients with diagnosis of osteoporosis or at high risk for fracture should have regular bone mineral density tests. Patients eligible for Medicare are allowed routine testing every 2 years. The testing frequency can be increased to one year for patients who have rapidly progressing disease, are receiving or discontinuing medical therapy to restore bone mass, or have additional risk factors. I have reviewed this study and agree with the findings. [facility] Electronically Signed   By: Ammie Ferrier M.D.   On: 12/14/2022 10:55    No results found.  No results found.    Assessment and  Plan: Patient Active Problem List   Diagnosis Date Noted   Osteoporosis 12/18/2022   Anxiety 12/21/2020   Body mass index (BMI) 31.0-31.9, adult 12/21/2020   Fatty liver 12/21/2020   History of radiation therapy 12/21/2020   Personal history of malignant neoplasm of breast 12/21/2020   Pure hypercholesterolemia 12/21/2020   Rosacea 12/21/2020   Seronegative rheumatoid arthritis ([place]) 12/21/2020   Cough 06/22/2020   Breast wound, right, initial encounter 02/09/2020   Acquired absence of breast 12/22/2019   Genetic testing  07/20/2019   Family history of brain cancer    Family history of pancreatic cancer    Family history of lymphoma    Family history of esophageal cancer    Family history of lung cancer    Malignant neoplasm of upper-outer quadrant of right breast in female, estrogen receptor positive ([place]) 07/06/2019   Fuchs' corneal dystrophy 12/03/2016   Nuclear sclerotic cataract of both eyes 12/03/2016   GERD (gastroesophageal reflux disease) 05/30/2016   Clostridium difficile colitis 01/25/2016   Colitis, collagenous 09/22/2015   Depression 08/17/2014   Palpitations 04/20/2013   Hyperlipidemia 10/26/2012   Essential hypertension 10/26/2012   Atypical chest pain 10/23/2012   Mitral valve prolapse, nonrheumatic 10/23/2012   OA (osteoarthritis) of knee 08/07/2011      The patient does tolerate PAP and reports benefit from PAP use. The patient was reminded how to adjust mask fit and advised to change supplies regularly. The patient was also counselled on nightly use. The compliance is good. The AHI is 1.3. Pt continues to require cpap to treat her osa and is medically necessary.  1. OSA (obstructive sleep apnea) Continue nightly use  2. CPAP use counseling CPAP couseling-Discussed importance of adequate CPAP use as well as proper care and cleaning techniques of machine and all supplies.  3. Essential hypertension Continue current medication and f/u with PCP.  4.  Mitral valve prolapse, nonrheumatic Followed by cardiology  5. Malignant neoplasm of upper-outer quadrant of right breast in female, estrogen receptor positive ([place]) Followed by oncology  6. Anxiety and depression Continue current medication and f/u with psych.  7. Obesity (BMI 30.0-34.9) Obesity Counseling: Had a lengthy discussion regarding patients BMI and weight issues. Patient was instructed on portion control as well as increased activity. Also discussed caloric restrictions with trying to maintain intake less than 2000 Kcal. Discussions were made in accordance with the 5As of weight management. Simple actions such as not eating late and if able to, taking a walk is suggested.    General Counseling: I have discussed the findings of the evaluation and examination with Rachel Sutton.  I have also discussed any further diagnostic evaluation thatmay be needed or ordered today. Rachel Sutton verbalizes understanding of the findings of todays visit. We also reviewed her medications today and discussed drug interactions and side effects including but not limited excessive drowsiness and altered mental states. We also discussed that there is always a risk not just to her but also people around her. she has been encouraged to call the office with any questions or concerns that should arise related to todays visit.  No orders of the defined types were placed in this encounter.       I have personally obtained a history, examined the patient, evaluated laboratory and imaging results, formulated the assessment and plan and placed orders.  This patient was seen by Drema Dallas, PA-C in collaboration with Dr. Devona Konig as a part of collaborative care agreement.  Allyne Gee, MD [facility] Diplomate ABMS Pulmonary Critical Care Medicine and Sleep Medicine

## 2023-02-14 ENCOUNTER — Encounter: Payer: Self-pay | Admitting: Radiology

## 2023-02-21 ENCOUNTER — Ambulatory Visit
Admission: RE | Admit: 2023-02-21 | Discharge: 2023-02-21 | Disposition: A | Discharge status: Home / Self Care | Payer: Commercial Managed Care - PPO | Source: Ambulatory Visit | Attending: Nurse Practitioner | Admitting: Nurse Practitioner

## 2023-02-21 ENCOUNTER — Encounter: Payer: Self-pay | Admitting: Nurse Practitioner

## 2023-02-21 DIAGNOSIS — Z170 Estrogen receptor positive status [ER+]: Secondary | ICD-10-CM

## 2023-03-02 ENCOUNTER — Other Ambulatory Visit: Payer: Self-pay | Admitting: Cardiology

## 2023-04-18 ENCOUNTER — Telehealth (INDEPENDENT_AMBULATORY_CARE_PROVIDER_SITE_OTHER): Payer: Commercial Managed Care - PPO | Admitting: Psychiatry

## 2023-04-18 ENCOUNTER — Encounter (HOSPITAL_COMMUNITY): Payer: Self-pay | Admitting: Psychiatry

## 2023-04-18 ENCOUNTER — Telehealth (HOSPITAL_COMMUNITY): Payer: Commercial Managed Care - PPO | Admitting: Psychiatry

## 2023-04-18 DIAGNOSIS — F3341 Major depressive disorder, recurrent, in partial remission: Secondary | ICD-10-CM | POA: Diagnosis not present

## 2023-04-18 DIAGNOSIS — F322 Major depressive disorder, single episode, severe without psychotic features: Secondary | ICD-10-CM

## 2023-04-18 MED ORDER — ALPRAZOLAM 0.5 MG PO TABS: 0.5000 mg | ORAL_TABLET | Freq: Three times a day (TID) | ORAL | 1 refills | Status: DC

## 2023-04-18 MED ORDER — SERTRALINE HCL 25 MG PO TABS: 25.0000 mg | ORAL_TABLET | Freq: Every day | ORAL | 2 refills | Status: DC

## 2023-04-18 NOTE — Progress Notes (Signed)
Virtual Visit via Video Note  I connected with Rachel Sutton on 04/18/23 at  9:40 AM EDT by a video enabled telemedicine application and verified that I am speaking with the correct person using two identifiers.  Location: Patient: home Provider: office   I discussed the limitations of evaluation and management by telemedicine and the availability of in person appointments. The patient expressed understanding and agreed to proceed.    I discussed the assessment and treatment plan with the patient. The patient was provided an opportunity to ask questions and all were answered. The patient agreed with the plan and demonstrated an understanding of the instructions.   The patient was advised to call back or seek an in-person evaluation if the symptoms worsen or if the condition fails to improve as anticipated.  I provided 15 minutes of non-face-to-face time during this encounter.   [name], MD  [facility] MD/PA/NP OP Progress Note  04/18/2023 10:04 AM Rachel Sutton  MRN:  [id number]  Chief Complaint:  Chief Complaint  Patient presents with   Depression   Anxiety   Follow-up   HPI: This patient is a 62 year old-year-old white female who is living in [place] with her husband.  She works as an Airline pilot for [employer].  The patient returns for follow-up after 6 months.  She states she has been very stressed lately.  Her company made her come back to work in person 4 days a week which involves 2-1/2 hours of driving each day.  Also her 84 year old daughter moved in a few months ago and has not been paying any rent or helping out financially.  The daughter also has mental illness and was recently in a psychiatric hospital.  This is causing conflicts with her husband.  The patient states that she is waking up with scratch marks on her arms and thinks she is not sleeping very soundly and is much more anxious.  She asked if we can increase the Xanax a little bit and this is  reasonable.  She denies significant depression or thoughts of self-harm or suicide.  She has been having a lot of diarrhea but recently had a normal colonoscopy.  I am wondering if perhaps this is due to stress but she is on medication for it. Visit Diagnosis:    ICD-10-CM   1. Recurrent major depressive disorder, in partial remission (HCC)  F33.41     2. Major depressive disorder, single episode, severe without psychotic features (HCC)  F32.2       Past Psychiatric History: Long-term outpatient treatment  Past Medical History:  Past Medical History:  Diagnosis Date   Anxiety    Arthritis    Breast cancer (HCC) 02/22/2012   Right T1c, N0   C. difficile colitis    Colitis, collagenous 09/22/2015   Depression    Essential hypertension    Family history of brain cancer    Family history of esophageal cancer    Family history of lung cancer    Family history of lymphoma    Family history of pancreatic cancer    Fuchs' corneal dystrophy    GERD (gastroesophageal reflux disease)    Hypertension    Mitral valve prolapse    Mild mitral regurgitation   Mixed hyperlipidemia    OSA (obstructive sleep apnea)    Peripheral neuropathy    Seizures (HCC)    Only with dose of haldol; no more since that was stopped.    Past Surgical History:  Procedure Laterality Date  ABDOMINAL HYSTERECTOMY  1999   BACK SURGERY     BIOPSY N/A 09/28/2015   Procedure: BIOPSY;  Surgeon: West Bali, MD;  Location: [facility];  Service: Endoscopy;  Laterality: N/A;   BIOPSY  04/05/2016   Procedure: BIOPSY;  Surgeon: West Bali, MD;  Location: [facility];  Service: Endoscopy;;  colon bx   BREAST LUMPECTOMY  10/30/2002   right   BREAST RECONSTRUCTION WITH PLACEMENT OF TISSUE EXPANDER AND FLEX HD (ACELLULAR HYDRATED DERMIS) Right 09/07/2019   Procedure: RIGHT BREAST RECONSTRUCTION WITH PLACEMENT OF TISSUE EXPANDER AND FLEX HD (ACELLULAR HYDRATED DERMIS);  Surgeon: Peggye Form, DO;  Location: [facility]  OR;  Service: Plastics;  Laterality: Right;   CARDIAC CATHETERIZATION     Carpal tunnel R/L  1992   Cervical spine fusion X2 since last visit     COLONOSCOPY WITH PROPOFOL N/A 09/28/2015   Procedure: COLONOSCOPY WITH PROPOFOL;  Surgeon: West Bali, MD;  Location: [facility];  Service: Endoscopy;  Laterality: N/A;  procedure 1 cecum time in 1232  time out 1246   total time 14 mintes   COLONOSCOPY WITH PROPOFOL N/A 04/05/2016   Procedure: COLONOSCOPY WITH PROPOFOL;  Surgeon: West Bali, MD;  [facility]: [facility];  Service: Endoscopy;  Laterality: N/A;  1200   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   DEBRIDEMENT AND CLOSURE WOUND Right 02/17/2020   Procedure: Excision of right breast wound with closure and provena placement;  Surgeon: Peggye Form, DO;  Location: [facility];  Service: Plastics;  Laterality: Right;   ELBOW SURGERY Right  tennis elbow release 2012   ESOPHAGOGASTRODUODENOSCOPY  10/26/2012   Procedure: ESOPHAGOGASTRODUODENOSCOPY (EGD);  Surgeon: Louis Meckel, MD;  Location: [facility] ENDOSCOPY;  Service: Endoscopy;  Laterality: N/A;   ESOPHAGOGASTRODUODENOSCOPY (EGD) WITH PROPOFOL N/A 09/28/2015   Procedure: ESOPHAGOGASTRODUODENOSCOPY (EGD) WITH PROPOFOL;  Surgeon: West Bali, MD;  Location: [facility];  Service: Endoscopy;  Laterality: N/A;   EYE SURGERY  2018   corneal transplants bilateral   KNEE ARTHROSCOPY Left    LEFT HEART CATHETERIZATION WITH CORONARY ANGIOGRAM N/A 10/23/2012   Procedure: LEFT HEART CATHETERIZATION WITH CORONARY ANGIOGRAM;  Surgeon: Peter M Swaziland, MD;  Location: [facility] CATH LAB;  Service: Cardiovascular;  Laterality: N/A;   MASTECTOMY Right    MASTECTOMY W/ SENTINEL NODE BIOPSY Right 09/07/2019   Procedure: RIGHT MASTECTOMY WITH SENTINEL LYMPH NODE BIOPSY;  Surgeon: Griselda Miner, MD;  Location: [facility] OR;  Service: General;  Laterality: Right;   OVARIAN CYST REMOVAL     prior to hysterectomy, right   Ray cage fusion  1997   REMOVAL OF  TISSUE EXPANDER Right 12/31/2019   Procedure: REMOVAL OF TISSUE EXPANDER AND EXCESS SKIN;  Surgeon: Peggye Form, DO;  Location: [facility];  Service: Plastics;  Laterality: Right;  1 hour   SENTINEL LYMPH NODE BIOPSY  10/30/2002   right   TONSILLECTOMY     at six months old    Family Psychiatric History: See below  Family History:  Family History  Problem Relation Age of Onset   Diabetes Mother    Pancreatic cancer Mother 108   Lung cancer Father    Diabetes Father    Heart disease Father    Esophageal cancer Father 82       Beer drinker   Schizophrenia Sister    Cancer Maternal Uncle        unknown form of cancer; cigar smoker  Lung cancer Maternal Grandmother    Brain cancer Maternal Grandfather    Heart disease Paternal Grandfather    Brain cancer Cousin        dx in his late 51s-40s   Brain cancer Brother 73   Lymphoma Niece 80   Anesthesia problems Neg Hx    Hypotension Neg Hx    Malignant hyperthermia Neg Hx    Pseudochol deficiency Neg Hx    Colon cancer Neg Hx    Colon polyps Neg Hx    Crohn's disease Neg Hx    Ulcerative colitis Neg Hx    Breast cancer Neg Hx     Social History:  Social History   Socioeconomic History   Marital status: Married    Spouse name: Not on file   Number of children: 2   Years of education: Not on file   Highest education level: Not on file  Occupational History   Occupation: Agricultural engineer: [employer]    Comment: [employer]  Tobacco Use   Smoking status: Every Day    Packs/day: 0.30    Years: 35.00    Additional pack years: 0.00    Total pack years: 10.50    Types: Cigarettes    Start date: 07/07/1974    Last attempt to quit: 07/14/2019    Years since quitting: 3.7   Smokeless tobacco: Never  Vaping Use   Vaping Use: Never used  Substance and Sexual Activity   Alcohol use: Not Currently    Alcohol/week: 0.0 standard drinks of alcohol    Comment: stopped July 26, 2014)   Drug use: No    Sexual activity: Not Currently    Birth control/protection: Surgical  Other Topics Concern   Not on file  Social History Narrative   Not on file   Social Determinants of Health   Financial Resource Strain: Not on file  Food Insecurity: Not on file  Transportation Needs: Not on file  Physical Activity: Not on file  Stress: Not on file  Social Connections: Not on file    Allergies:  Allergies  Allergen Reactions   Bee Venom Anaphylaxis   Haloperidol Lactate Other (See Comments)    Convulsions    Meperidine Hcl Hives   Xifaxan [Rifaximin] Diarrhea and Other (See Comments)    Constantly felt the need to use the bathroom     Metabolic Disorder Labs: No results found for: "HGBA1C", "MPG" No results found for: "PROLACTIN" Lab Results  Component Value Date   CHOL 207 (H) 10/23/2012   TRIG 319 (H) 10/23/2012   HDL 33 (L) 10/23/2012   CHOLHDL 6.3 10/23/2012   VLDL 64 (H) 10/23/2012   LDLCALC 110 (H) 10/23/2012   Lab Results  Component Value Date   TSH 0.706 09/22/2015    Therapeutic Level Labs: No results found for: "LITHIUM" No results found for: "VALPROATE" No results found for: "CBMZ"  Current Medications: Current Outpatient Medications  Medication Sig Dispense Refill   acetaminophen (TYLENOL) 500 MG tablet Take 1,000 mg by mouth every 6 (six) hours as needed for moderate pain or headache.     ALPRAZolam (XANAX) 0.5 MG tablet Take 1 tablet (0.5 mg total) by mouth 3 (three) times daily. 270 tablet 1   Calcium-Magnesium-Vitamin D (CALCIUM 500 PO) Take 1 tablet by mouth daily.      Dexlansoprazole 30 MG capsule DR Take 1 capsule by mouth daily.     diclofenac sodium (VOLTAREN) 1 % GEL Apply 1 application topically 3 (three)  times daily as needed (pain).      EPINEPHrine (EPI-PEN) 0.3 mg/0.3 mL DEVI Inject 0.3 mg into the muscle as needed (bee stings).      fish oil-omega-3 fatty acids 1000 MG capsule Take 1 g by mouth every morning.      gabapentin (NEURONTIN) 300  MG capsule Take 300 mg by mouth 3 (three) times daily.     hydrochlorothiazide (MICROZIDE) 12.5 MG capsule Take 1 capsule (12.5 mg total) by mouth daily. 90 capsule 1   hydroxychloroquine (PLAQUENIL) 200 MG tablet Take 400 mg by mouth daily.     leflunomide (ARAVA) 20 MG tablet Take 20 mg by mouth daily.     metoprolol succinate (TOPROL-XL) 50 MG 24 hr tablet TAKE 1 TABLET TWICE A DAY  WITH OR IMMEDIATELY        FOLLOWING A MEAL 180 tablet 2   Multiple Vitamin (MULITIVITAMIN WITH MINERALS) TABS Take 1 tablet by mouth every morning.     NIFEdipine (PROCARDIA XL/NIFEDICAL-XL) 90 MG 24 hr tablet Take 1 tablet (90 mg total) by mouth daily. 90 tablet 1   nitroGLYCERIN (NITROSTAT) 0.4 MG SL tablet Place 1 tablet (0.4 mg total) under the tongue every 5 (five) minutes x 3 doses as needed for chest pain. 25 tablet 3   pantoprazole (PROTONIX) 40 MG tablet TAKE 1 TABLET DAILY 90 tablet 3   pravastatin (PRAVACHOL) 20 MG tablet Take 20 mg by mouth daily.     predniSONE (DELTASONE) 10 MG tablet Take 10 mg by mouth. As needed for gout flare up     sertraline (ZOLOFT) 25 MG tablet Take 1 tablet (25 mg total) by mouth daily. 90 tablet 2   sulfaSALAzine (AZULFIDINE) 500 MG tablet Take 500 mg by mouth daily.     tamoxifen (NOLVADEX) 20 MG tablet TAKE 1 TABLET DAILY 90 tablet 1   Current Facility-Administered Medications  Medication Dose Route Frequency Provider Last Rate Last Admin   betamethasone acetate-betamethasone sodium phosphate (CELESTONE) injection 3 mg  3 mg Intramuscular Once Felecia Shelling, DPM         Musculoskeletal: Strength & Muscle Tone: within normal limits Gait & Station: normal Patient leans: N/A  Psychiatric Specialty Exam: Review of Systems  Gastrointestinal:  Positive for diarrhea.  Psychiatric/Behavioral:  The patient is nervous/anxious.   All other systems reviewed and are negative.   There were no vitals taken for this visit.There is no height or weight on file to calculate  BMI.  General Appearance: Casual and Fairly Groomed  Eye Contact:  Good  Speech:  Clear and Coherent  Volume:  Normal  Mood:  Anxious  Affect:  Appropriate and Congruent  Thought Process:  Goal Directed  Orientation:  Full (Time, Place, and Person)  Thought Content: WDL   Suicidal Thoughts:  No  Homicidal Thoughts:  No  Memory:  Immediate;   Good Recent;   Good Remote;   Fair  Judgement:  Good  Insight:  Good  Psychomotor Activity:  Normal  Concentration:  Concentration: Good and Attention Span: Good  Recall:  Good  Fund of Knowledge: Good  Language: Good  Akathisia:  No  Handed:  Right  AIMS (if indicated): not done  Assets:  Communication Skills Desire for Improvement Resilience Social Support Talents/Skills Vocational/Educational  ADL's:  Intact  Cognition: WNL  Sleep:  Fair   Screenings: GAD-7    Flowsheet Row Office Visit from 07/11/2022 in [facility] [place]  Total GAD-7 Score 3  PHQ2-9    Flowsheet Row Office Visit from 07/11/2022 in [facility] [place] Video Visit from 06/07/2022 in [facility] at [place] Video Visit from 12/07/2021 in [facility] at [place] Video Visit from 09/07/2021 in [facility] at [place] Video Visit from 08/10/2021 in [facility] Total Score 1 0 0 0 1  PHQ-9 Total Score 7 -- -- -- --      Flowsheet Row Office Visit from 07/11/2022 in [facility] [place] Video Visit from 06/07/2022 in [facility] at [place] Video Visit from 12/07/2021 in [facility] at [place]  C-SSRS RISK CATEGORY No Risk No Risk No Risk        Assessment and Plan: This patient is a 62 year old female with a history of depression and anxiety.  She is  more stressed due to the situation with her daughter as well as a longer commute for work.  We will increase Xanax to 0.5 mg in the morning and 1 mg at bedtime to help with anxiety and sleep.  She will continue Zoloft 25 mg daily for depression and anxiety.  She will return to see me in 3 months  Collaboration of Care: Collaboration of Care: Primary Care Provider AEB notes will be shared with PCP at patient's request  Patient/Guardian was advised Release of Information must be obtained prior to any record release in order to collaborate their care with an outside provider. Patient/Guardian was advised if they have not already done so to contact the registration department to sign all necessary forms in order for [place] to release information regarding their care.   Consent: Patient/Guardian gives verbal consent for treatment and assignment of benefits for services provided during this visit. Patient/Guardian expressed understanding and agreed to proceed.    [name], MD 04/18/2023, 10:04 AM

## 2023-04-23 ENCOUNTER — Other Ambulatory Visit: Payer: Self-pay | Admitting: Cardiology

## 2023-05-15 ENCOUNTER — Encounter: Payer: Self-pay | Admitting: Nurse Practitioner

## 2023-05-17 ENCOUNTER — Other Ambulatory Visit: Payer: Self-pay | Admitting: Hematology

## 2023-05-20 ENCOUNTER — Other Ambulatory Visit: Payer: Self-pay

## 2023-05-20 DIAGNOSIS — C50411 Malignant neoplasm of upper-outer quadrant of right female breast: Secondary | ICD-10-CM

## 2023-05-21 ENCOUNTER — Inpatient Hospital Stay: Payer: Commercial Managed Care - PPO

## 2023-05-21 ENCOUNTER — Inpatient Hospital Stay: Payer: Commercial Managed Care - PPO | Attending: Nurse Practitioner | Admitting: Nurse Practitioner

## 2023-05-21 ENCOUNTER — Encounter: Payer: Self-pay | Admitting: Nurse Practitioner

## 2023-05-21 ENCOUNTER — Other Ambulatory Visit: Payer: Self-pay

## 2023-05-21 VITALS — BP 127/67 | HR 76 | Temp 98.4°F | Resp 22

## 2023-05-21 VITALS — BP 128/77 | HR 84 | Temp 98.2°F | Resp 16 | Wt 187.7 lb

## 2023-05-21 DIAGNOSIS — Z170 Estrogen receptor positive status [ER+]: Secondary | ICD-10-CM | POA: Diagnosis not present

## 2023-05-21 DIAGNOSIS — C50411 Malignant neoplasm of upper-outer quadrant of right female breast: Secondary | ICD-10-CM | POA: Insufficient documentation

## 2023-05-21 DIAGNOSIS — Z79810 Long term (current) use of selective estrogen receptor modulators (SERMs): Secondary | ICD-10-CM | POA: Insufficient documentation

## 2023-05-21 DIAGNOSIS — M810 Age-related osteoporosis without current pathological fracture: Secondary | ICD-10-CM

## 2023-05-21 DIAGNOSIS — M8589 Other specified disorders of bone density and structure, multiple sites: Secondary | ICD-10-CM | POA: Insufficient documentation

## 2023-05-21 LAB — CBC WITH DIFFERENTIAL (CANCER CENTER ONLY)
Abs Immature Granulocytes: 0 10*3/uL (ref 0.00–0.07)
Basophils Absolute: 0 10*3/uL (ref 0.0–0.1)
Basophils Relative: 0 %
Eosinophils Absolute: 0.4 10*3/uL (ref 0.0–0.5)
Eosinophils Relative: 4 %
HCT: 36.9 % (ref 36.0–46.0)
Hemoglobin: 12.6 g/dL (ref 12.0–15.0)
Lymphocytes Relative: 44 %
Lymphs Abs: 4 10*3/uL (ref 0.7–4.0)
MCH: 30.4 pg (ref 26.0–34.0)
MCHC: 34.1 g/dL (ref 30.0–36.0)
MCV: 89.1 fL (ref 80.0–100.0)
Monocytes Absolute: 0.6 10*3/uL (ref 0.1–1.0)
Monocytes Relative: 7 %
Neutro Abs: 4.1 10*3/uL (ref 1.7–7.7)
Neutrophils Relative %: 45 %
Platelet Count: 289 10*3/uL (ref 150–400)
RBC: 4.14 MIL/uL (ref 3.87–5.11)
RDW: 13.6 % (ref 11.5–15.5)
Smear Review: NORMAL
WBC Count: 9 10*3/uL (ref 4.0–10.5)
nRBC: 0 % (ref 0.0–0.2)

## 2023-05-21 LAB — CMP (CANCER CENTER ONLY)
ALT: 17 U/L (ref 0–44)
AST: 26 U/L (ref 15–41)
Albumin: 3.9 g/dL (ref 3.5–5.0)
Alkaline Phosphatase: 68 U/L (ref 38–126)
Anion gap: 6 (ref 5–15)
BUN: 15 mg/dL (ref 8–23)
CO2: 29 mmol/L (ref 22–32)
Calcium: 9 mg/dL (ref 8.9–10.3)
Chloride: 104 mmol/L (ref 98–111)
Creatinine: 0.71 mg/dL (ref 0.44–1.00)
GFR, Estimated: >60 mL/min (ref >60)
Glucose, Bld: 255 mg/dL — ABNORMAL HIGH (ref 70–99)
Potassium: 4.2 mmol/L (ref 3.5–5.1)
Sodium: 139 mmol/L (ref 135–145)
Total Bilirubin: 0.4 mg/dL (ref 0.3–1.2)
Total Protein: 7 g/dL (ref 6.5–8.1)

## 2023-05-21 MED ORDER — ZOLEDRONIC ACID 4 MG/100ML IV SOLN
4.0000 mg | Freq: Once | INTRAVENOUS | Status: AC
  Administered 2023-05-21: 4 mg via INTRAVENOUS
  Filled 2023-05-21: qty 100

## 2023-05-21 MED ORDER — SODIUM CHLORIDE 0.9 % IV SOLN: Freq: Once | INTRAVENOUS | Status: AC

## 2023-05-21 NOTE — Patient Instructions (Signed)

## 2023-05-21 NOTE — Progress Notes (Signed)
Patient Care Team: Pa, [facility] as PCP - General [name], Illene Bolus, MD as PCP - Cardiology (Cardiology) Jodelle Gross, NP as Nurse Practitioner (Nurse Practitioner) Pershing Proud, RN as Oncology Nurse Navigator Donnelly Angelica, RN as Oncology Nurse Navigator Malachy Mood, MD as Consulting Physician (Hematology) Griselda Miner, MD as Consulting Physician (General Surgery) Antony Blackbird, MD as Consulting Physician (Radiation Oncology) Casimer Lanius, MD as Consulting Physician (Rheumatology) Pollyann Samples, NP as Nurse Practitioner (Nurse Practitioner)   CHIEF COMPLAINT: Follow up right breast cancer  Oncology History  Malignant neoplasm of upper-outer quadrant of right breast in female, estrogen receptor positive (HCC)  07/01/2019 Mammogram   Diagnostic mammogram 07/01/19  IMPRESSION: Suspicious mass in the 9:30 region of the right breast 9 cm from the nipple measuring 1.3 x 1.3 x 1.1 cm. Suspicious mass in the right axilla measuring 5 x 4 x 4 mm.   07/02/2019 Cancer Staging   Staging form: Breast, AJCC 8th Edition - Clinical stage from 07/02/2019: Stage IA (cT1c, cN0, cM0, G2, ER+, PR+, HER2: Equivocal) - Signed by Malachy Mood, MD on 07/07/2019   07/02/2019 Initial Biopsy   Diagnosis 07/02/19 1. Breast, right, needle core biopsy, 9:30 o'clock - INVASIVE DUCTAL CARCINOMA, GRADE 1-2. SEE NOTE - DUCTAL CARCINOMA IN SITU, INTERMEDIATE GRADE 2. Lymph node, needle/core biopsy, right axilla - LYMPH NODE, NEGATIVE FOR CARCINOMA   07/02/2019 Receptors her2   By immunohistochemistry, the tumor cells are EQUIVOCAL for Her2 (2+). HER2 by FISH will be PERFORMED and the RESULTS REPORTED SEPARATELY Estrogen Receptor: 100%, POSITIVE, STRONG STAINING INTENSITY Progesterone Receptor: 90%, POSITIVE, STRONG STAINING INTENSITY Proliferation Marker Ki67: 10%   07/06/2019 Initial Diagnosis   Malignant neoplasm of upper-outer quadrant of right breast in female, estrogen  receptor positive (HCC)   07/08/2019 -  Anti-estrogen oral therapy   Anastrozole 1mg  once daily starting 07/08/19 before surgery.    07/20/2019 Genetic Testing   Negative genetic testing on the [facility] Multi-Cancer Panel. The report date is 07/20/2019.  The Multi-Gene Panel offered by [facility] includes sequencing and/or deletion duplication testing of the following 85 genes: AIP, ALK, APC, ATM, AXIN2,BAP1,  BARD1, BLM, BMPR1A, BRCA1, BRCA2, BRIP1, CASR, CDC73, CDH1, CDK4, CDKN1B, CDKN1C, CDKN2A (p14ARF), CDKN2A (p16INK4a), CEBPA, CHEK2, CTNNA1, DICER1, DIS3L2, EGFR (c.2369C>T, p.Thr790Met variant only), EPCAM (Deletion/duplication testing only), FH, FLCN, GATA2, GPC3, GREM1 (Promoter region deletion/duplication testing only), HOXB13 (c.251G>A, p.Gly84Glu), HRAS, KIT, MAX, MEN1, MET, MITF (c.952G>A, p.Glu318Lys variant only), MLH1, MSH2, MSH3, MSH6, MUTYH, NBN, NF1, NF2, NTHL1, PALB2, PDGFRA, PHOX2B, PMS2, POLD1, POLE, POT1, PRKAR1A, PTCH1, PTEN, RAD50, RAD51C, RAD51D, RB1, RECQL4, RET, RNF43, RUNX1, SDHAF2, SDHA (sequence changes only), SDHB, SDHC, SDHD, SMAD4, SMARCA4, SMARCB1, SMARCE1, STK11, SUFU, TERC, TERT, TMEM127, TP53, TSC1, TSC2, VHL, WRN and WT1.     08/29/2019 Surgery   RIGHT MASTECTOMY WITH SENTINEL LYMPH NODE BIOPSY and RECONSTRUCTION WITH PLACEMENT OF TISSUE EXPANDER AND FLEX HD (ACELLULAR HYDRATED DERMIS) by Dr. Carolynne Edouard and Dr Ulice Bold  09/07/19   09/07/2019 Pathology Results   DIAGNOSIS: 09/07/19  A. SENTINEL LYMPH NODE, RIGHT # 1, BIOPSY:  - One lymph node, negative for carcinoma (0/1).   B. SENTINEL LYMPH NODE, RIGHT # 2, BIOPSY:  - One lymph node, negative for carcinoma (0/1).   C. BREAST, RIGHT, MASTECTOMY:  - Invasive ductal carcinoma, 1.8 cm, Nottingham grade 1 of 3.  - Margins of resection are not involved (Closest margin: 20 mm, deep).  - Ductal carcinoma in situ, focal.  - Biopsy site  changes.  - See oncology table.    09/07/2019 Oncotype testing   Oncotype RS: 17, meaning  there is 5% distant recurrence risk at 9 years with AI/tamoxifen alone. Indicating <1% chemo benefit    10/28/2019 Cancer Staging   Staging form: Breast, AJCC 8th Edition - Pathologic: Stage IA (pT1c, pN0, cM0, G1, ER+, PR+, HER2-, Oncotype DX score: 17) - Signed by Malachy Mood, MD on 10/28/2019   12/31/2019 Pathology Results   FINAL MICROSCOPIC DIAGNOSIS:  A. SKIN, RIGHT BREAST MASTECTOMY FLAP AND SCAR:  - Skin and subcutaneous tissue with fibrosis, mild inflammation and  focal giant cell reaction consistent with scar.  - No evidence of malignancy.    02/24/2020 Survivorship   SCP delivered by Santiago Glad, NP       CURRENT THERAPY: Anastrozole 1 mg daily, starting 10/28/2019, switched to to tamoxifen due to joint pain in 11/23/2021  INTERVAL HISTORY Rachel Sutton returns for follow up as scheduled. Last seen by me 11/20/22. DEXA in December showed osteoporosis. She had 2 crowns placed in April and has healed well. She is due to begin zometa today. She continues Tamoxifen, tolerating well except hot flashes, although increasing gabapentin did help some. She has a nodule in the high right axilla that she thinks is enlarging. Denies recent injury or vaccine to that arm.   ROS  All other systems reviewed and negative   Past Medical History:  Diagnosis Date   Anxiety    Arthritis    Breast cancer (HCC) 02/22/2012   Right T1c, N0   C. difficile colitis    Colitis, collagenous 09/22/2015   Depression    Essential hypertension    Family history of brain cancer    Family history of esophageal cancer    Family history of lung cancer    Family history of lymphoma    Family history of pancreatic cancer    Fuchs' corneal dystrophy    GERD (gastroesophageal reflux disease)    Hypertension    Mitral valve prolapse    Mild mitral regurgitation   Mixed hyperlipidemia    OSA (obstructive sleep apnea)    Peripheral neuropathy    Seizures (HCC)    Only with dose of haldol; no more since that  was stopped.     Past Surgical History:  Procedure Laterality Date   ABDOMINAL HYSTERECTOMY  1999   BACK SURGERY     BIOPSY N/A 09/28/2015   Procedure: BIOPSY;  Surgeon: West Bali, MD;  Location: [facility];  Service: Endoscopy;  Laterality: N/A;   BIOPSY  04/05/2016   Procedure: BIOPSY;  Surgeon: West Bali, MD;  Location: [facility];  Service: Endoscopy;;  colon bx   BREAST LUMPECTOMY  10/30/2002   right   BREAST RECONSTRUCTION WITH PLACEMENT OF TISSUE EXPANDER AND FLEX HD (ACELLULAR HYDRATED DERMIS) Right 09/07/2019   Procedure: RIGHT BREAST RECONSTRUCTION WITH PLACEMENT OF TISSUE EXPANDER AND FLEX HD (ACELLULAR HYDRATED DERMIS);  Surgeon: Peggye Form, DO;  Location: [facility];  Service: Plastics;  Laterality: Right;   CARDIAC CATHETERIZATION     Carpal tunnel R/L  1992   Cervical spine fusion X2 since last visit     COLONOSCOPY WITH PROPOFOL N/A 09/28/2015   Procedure: COLONOSCOPY WITH PROPOFOL;  Surgeon: West Bali, MD;  Location: [facility];  Service: Endoscopy;  Laterality: N/A;  procedure 1 cecum time in 1232  time out 1246   total time 14 mintes   COLONOSCOPY WITH PROPOFOL N/A 04/05/2016  Procedure: COLONOSCOPY WITH PROPOFOL;  Surgeon: West Bali, MD;  Location: [facility];  Service: Endoscopy;  Laterality: N/A;  1200   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   DEBRIDEMENT AND CLOSURE WOUND Right 02/17/2020   Procedure: Excision of right breast wound with closure and provena placement;  Surgeon: Peggye Form, DO;  Location: [facility];  Service: Plastics;  Laterality: Right;   ELBOW SURGERY Right  tennis elbow release 2012   ESOPHAGOGASTRODUODENOSCOPY  10/26/2012   Procedure: ESOPHAGOGASTRODUODENOSCOPY (EGD);  Surgeon: Louis Meckel, MD;  Location: [facility];  Service: Endoscopy;  Laterality: N/A;   ESOPHAGOGASTRODUODENOSCOPY (EGD) WITH PROPOFOL N/A 09/28/2015   Procedure: ESOPHAGOGASTRODUODENOSCOPY (EGD) WITH PROPOFOL;  Surgeon:  West Bali, MD;  Location: [facility];  Service: Endoscopy;  Laterality: N/A;   EYE SURGERY  2018   corneal transplants bilateral   KNEE ARTHROSCOPY Left    LEFT HEART CATHETERIZATION WITH CORONARY ANGIOGRAM N/A 10/23/2012   Procedure: LEFT HEART CATHETERIZATION WITH CORONARY ANGIOGRAM;  Surgeon: Peter M Swaziland, MD;  Location: [facility];  Service: Cardiovascular;  Laterality: N/A;   MASTECTOMY Right    MASTECTOMY W/ SENTINEL NODE BIOPSY Right 09/07/2019   Procedure: RIGHT MASTECTOMY WITH SENTINEL LYMPH NODE BIOPSY;  Surgeon: Griselda Miner, MD;  Location: [facility];  Service: General;  Laterality: Right;   OVARIAN CYST REMOVAL     prior to hysterectomy, right   Ray cage fusion  1997   REMOVAL OF TISSUE EXPANDER Right 12/31/2019   Procedure: REMOVAL OF TISSUE EXPANDER AND EXCESS SKIN;  Surgeon: Peggye Form, DO;  Location: [facility];  Service: Plastics;  Laterality: Right;  1 hour   SENTINEL LYMPH NODE BIOPSY  10/30/2002   right   TONSILLECTOMY     at six months old     Outpatient Encounter Medications as of 05/21/2023  Medication Sig   acetaminophen (TYLENOL) 500 MG tablet Take 1,000 mg by mouth every 6 (six) hours as needed for moderate pain or headache.   ALPRAZolam (XANAX) 0.5 MG tablet Take 1 tablet (0.5 mg total) by mouth 3 (three) times daily. (Patient taking differently: Take 0.5 mg by mouth 3 (three) times daily. 1 tablet by mouth in the morning and 2 tablets by mouth at bed time)   Calcium-Magnesium-Vitamin D (CALCIUM 500 PO) Take 1 tablet by mouth daily.    Dexlansoprazole 30 MG capsule DR Take 1 capsule by mouth daily.   diclofenac sodium (VOLTAREN) 1 % GEL Apply 1 application topically 3 (three) times daily as needed (pain).    EPINEPHrine (EPI-PEN) 0.3 mg/0.3 mL DEVI Inject 0.3 mg into the muscle as needed (bee stings).    fish oil-omega-3 fatty acids 1000 MG capsule Take 1 g by mouth every morning.    gabapentin (NEURONTIN) 300 MG capsule Take 300 mg by  mouth 3 (three) times daily.   hydrochlorothiazide (MICROZIDE) 12.5 MG capsule TAKE 1 CAPSULE DAILY   hydroxychloroquine (PLAQUENIL) 200 MG tablet Take 400 mg by mouth daily.   leflunomide (ARAVA) 20 MG tablet Take 20 mg by mouth daily.   metoprolol succinate (TOPROL-XL) 50 MG 24 hr tablet TAKE 1 TABLET TWICE A DAY  WITH OR IMMEDIATELY        FOLLOWING A MEAL   Multiple Vitamin (MULITIVITAMIN WITH MINERALS) TABS Take 1 tablet by mouth every morning.   NIFEdipine (PROCARDIA XL/NIFEDICAL-XL) 90 MG 24 hr tablet TAKE 1 TABLET DAILY   nitroGLYCERIN (NITROSTAT) 0.4 MG SL tablet  Place 1 tablet (0.4 mg total) under the tongue every 5 (five) minutes x 3 doses as needed for chest pain.   pantoprazole (PROTONIX) 40 MG tablet TAKE 1 TABLET DAILY   pravastatin (PRAVACHOL) 20 MG tablet Take 20 mg by mouth daily.   predniSONE (DELTASONE) 10 MG tablet Take 10 mg by mouth. As needed for gout flare up   sertraline (ZOLOFT) 25 MG tablet Take 1 tablet (25 mg total) by mouth daily.   sulfaSALAzine (AZULFIDINE) 500 MG tablet Take 500 mg by mouth daily.   tamoxifen (NOLVADEX) 20 MG tablet TAKE 1 TABLET DAILY   Facility-Administered Encounter Medications as of 05/21/2023  Medication   betamethasone acetate-betamethasone sodium phosphate (CELESTONE) injection 3 mg     Today's Vitals   05/21/23 1037 05/21/23 1038 05/21/23 1106  BP: 128/77    Pulse: 84    Resp: (!) 97  16  Temp: 98.2 F (36.8 C)    TempSrc: Oral    SpO2: 97%    Weight: 187 lb 11.2 oz (85.1 kg)    PainSc:  0-No pain    Body mass index is 33.25 kg/m.   PHYSICAL EXAM GENERAL:alert, no distress and comfortable SKIN: no rash  EYES: sclera clear NECK: without mass LYMPH:  no palpable cervical or supraclavicular lymphadenopathy  LUNGS: normal breathing effort HEART:  no lower extremity edema ABDOMEN: abdomen soft, non-tender and normal bowel sounds NEURO: alert & oriented x 3 with fluent speech, no focal motor/sensory deficits Breast exam:  s/p right mastectomy, incision completely healed. Possible firmness/nodule in the high right axilla better appreciated with arm raised overhead. Left breast/axilla benign   CBC    Component Value Date/Time   WBC 9.0 05/21/2023 0949   WBC 10.1 09/03/2019 1030   RBC 4.14 05/21/2023 0949   HGB 12.6 05/21/2023 0949   HGB 14.2 06/01/2013 1509   HCT 36.9 05/21/2023 0949   HCT 41.6 06/01/2013 1509   PLT 289 05/21/2023 0949   PLT 331 06/01/2013 1509   MCV 89.1 05/21/2023 0949   MCV 90.1 06/01/2013 1509   MCH 30.4 05/21/2023 0949   MCHC 34.1 05/21/2023 0949   RDW 13.6 05/21/2023 0949   RDW 12.9 06/01/2013 1509   LYMPHSABS 4.0 05/21/2023 0949   LYMPHSABS 5.0 (H) 06/01/2013 1509   MONOABS 0.6 05/21/2023 0949   MONOABS 0.9 06/01/2013 1509   EOSABS 0.4 05/21/2023 0949   EOSABS 0.4 06/01/2013 1509   BASOSABS 0.0 05/21/2023 0949   BASOSABS 0.1 06/01/2013 1509     CMP     Component Value Date/Time   NA 139 05/21/2023 0949   NA 139 06/01/2013 1509   K 4.2 05/21/2023 0949   K 3.9 06/01/2013 1509   CL 104 05/21/2023 0949   CL 103 06/01/2013 1509   CO2 29 05/21/2023 0949   CO2 25 06/01/2013 1509   GLUCOSE 255 (H) 05/21/2023 0949   GLUCOSE 102 (H) 06/01/2013 1509   BUN 15 05/21/2023 0949   BUN 20.0 06/01/2013 1509   CREATININE 0.71 05/21/2023 0949   CREATININE 0.64 01/23/2016 1003   CREATININE 0.8 06/01/2013 1509   CALCIUM 9.0 05/21/2023 0949   CALCIUM 9.4 06/01/2013 1509   PROT 7.0 05/21/2023 0949   PROT 7.8 06/01/2013 1509   ALBUMIN 3.9 05/21/2023 0949   ALBUMIN 4.2 06/01/2013 1509   AST 26 05/21/2023 0949   AST 29 06/01/2013 1509   ALT 17 05/21/2023 0949   ALT 22 06/01/2013 1509   ALKPHOS 68 05/21/2023 0949   ALKPHOS  95 06/01/2013 1509   BILITOT 0.4 05/21/2023 0949   BILITOT 0.39 06/01/2013 1509   GFRNONAA >60 05/21/2023 0949   GFRAA >60 04/25/2020 1018     ASSESSMENT & PLAN:Denver S Kapolka is a 62 y.o. female with    1. Malignant neoplasm of upper-outer quadrant  of right breast, Stage IA, pT1cN0M0, ER+/PP+, HER2-, Grade 1 -Diagnosed in 08/2019. Given prior right lumpectomy and RT she underwent right mastectomy on 09/07/19. She has a low risk RS of 17 and adjuvant chemotherapy was not recommended. -Node-negative, postmastectomy radiation was not required. She underwent right breast reconstruction on 12/31/19 with wound complications afterward and it was removed.  -She started anastrozole before surgery on 10/28/19.  Goal is for a total of 5-7 years. She is tolerating well with mood swings and arthralgia, ultimately switched to tamoxifen 11/23/2021. Tolerating better with stable hot flashes -Ms. Faddis is clinically doing well. Exam is unremarkable except a small ?nodule in the high right axilla, otherwise benign. Will proceed with [place], I will call her with results.  -Labs reviewed, last L mammogram 02/21/2023 was negative, breast density category C. No mention of abnormal R LN.  -If work up is negative, continue surveillance and f/up in 6 months   2. H/o Right breast ductal carcinoma, pT1cN0M0 stage IA, ER-/PR-, HER2 +, Grade 3, Genetic testing was negative for pathogenetic mutations.  -Diagnosed on 10/16/02 managed by Dr. Pierce Crane  -Treated with right lumpectomy and SNLB, Adjuvant chemo AC-Docetaxol/Herceptin and RT   3. Osteopenia  -Her 11/2012 DEXA was normal.  -07/2020 DEXA showed osteopenia with lowest T-score -1.1 at left hip.  -Repeat DEXA 12/14/2022 showed worsening osteopenia in the femur and new osteoporosis at the left forearm lowest T-score -3.0 -She has healed from recent dental work and is to begin Zometa today (q6 mo x4 doses) -She will also start daily calcium and vit D   4. Rheumatoid arthritis, diagnosed in 2018.  -She is seen by Rheumatologist Dr. Deanne Coffer -She was previously on low dose weekly methotrexate and Cimzia self-injections q2weeks. She stopped after breast cancer diagnosis in 08/2019 -Stable -Continue rheumatology follow-up   5.  H/o Mitral Valve, HTN, GERD, Smoking Cessation -Had cardiac cath in 2013, no stent placed.  -She used to smoke 2 ppd. She has a 30+ year history.  Previously counseled on smoking cessation.    6. Anxiety -She was on Paxil before and currently on Xanax 0.5mg  -she is followed by Dr. Tenny Craw with behavioral health.  PLAN: -Recent L mammogram and today's labs reviewed -Continue Tamoxifen -R Ax [place] in 1-2 weeks, will call with results; if negative continue breast cancer surveillance  -Zometa today, start daily calcium/vit D -f/up with me in 6 months with 2nd dose Zometa   Orders Placed This Encounter  Procedures   [place] LIMITED ULTRASOUND INCLUDING AXILLA RIGHT BREAST    Standing Status:   Future    Standing Expiration Date:   05/20/2024    Order Specific Question:   Reason for Exam (SYMPTOM  OR DIAGNOSIS REQUIRED)    Answer:   enlarging R ax nodule, h/o R breast cancer s/p mastectomy    Order Specific Question:   Preferred imaging location?    Answer:   [facility]      All questions were answered. The patient knows to call the clinic with any problems, questions or concerns. No barriers to learning were detected. I spent 20 minutes counseling the patient face to face. The total time spent in the appointment was  30 minutes and more than 50% was on counseling, review of test results, and coordination of care.   Santiago Glad, NP-C 05/21/2023

## 2023-06-05 ENCOUNTER — Encounter: Payer: Self-pay | Admitting: Nurse Practitioner

## 2023-06-06 ENCOUNTER — Other Ambulatory Visit: Payer: Commercial Managed Care - PPO

## 2023-06-12 ENCOUNTER — Ambulatory Visit
Admission: RE | Admit: 2023-06-12 | Discharge: 2023-06-12 | Disposition: A | Discharge status: Home / Self Care | Payer: Commercial Managed Care - PPO | Source: Ambulatory Visit | Attending: Nurse Practitioner | Admitting: Nurse Practitioner

## 2023-06-12 ENCOUNTER — Other Ambulatory Visit: Payer: Self-pay | Admitting: Nurse Practitioner

## 2023-06-12 DIAGNOSIS — Z170 Estrogen receptor positive status [ER+]: Secondary | ICD-10-CM

## 2023-06-14 ENCOUNTER — Telehealth: Payer: Self-pay

## 2023-06-14 NOTE — Telephone Encounter (Signed)
Spoke with pt via telephone to go over recent Rt Axilla [place].  Informed pt that Rachel Glad, NP has reviewed the results and they have come back showing benign fat tissue which is not concerning or indicating the pt's cancer has returned.  Pt was Sutton to hear the great news!  No further questions or concerns at this time.

## 2023-07-18 ENCOUNTER — Encounter (HOSPITAL_COMMUNITY): Payer: Self-pay | Admitting: Psychiatry

## 2023-07-18 ENCOUNTER — Telehealth (INDEPENDENT_AMBULATORY_CARE_PROVIDER_SITE_OTHER): Payer: Commercial Managed Care - PPO | Admitting: Psychiatry

## 2023-07-18 ENCOUNTER — Other Ambulatory Visit (HOSPITAL_COMMUNITY): Payer: Self-pay | Admitting: Psychiatry

## 2023-07-18 DIAGNOSIS — F3341 Major depressive disorder, recurrent, in partial remission: Secondary | ICD-10-CM | POA: Diagnosis not present

## 2023-07-18 MED ORDER — SERTRALINE HCL 25 MG PO TABS: 25.0000 mg | ORAL_TABLET | Freq: Every day | ORAL | 2 refills | Status: DC

## 2023-07-18 MED ORDER — ALPRAZOLAM 0.5 MG PO TABS: ORAL_TABLET | ORAL | 2 refills | Status: DC

## 2023-07-18 MED ORDER — ALPRAZOLAM 0.5 MG PO TABS: 0.5000 mg | ORAL_TABLET | Freq: Three times a day (TID) | ORAL | 2 refills | Status: DC

## 2023-07-18 NOTE — Progress Notes (Signed)
Virtual Visit via Video Note  I connected with Rachel Sutton on 07/18/23 at  9:00 AM EDT by a video enabled telemedicine application and verified that I am speaking with the correct person using two identifiers.  Location: Patient: home Provider: office   I discussed the limitations of evaluation and management by telemedicine and the availability of in person appointments. The patient expressed understanding and agreed to proceed.     I discussed the assessment and treatment plan with the patient. The patient was provided an opportunity to ask questions and all were answered. The patient agreed with the plan and demonstrated an understanding of the instructions.   The patient was advised to call back or seek an in-person evaluation if the symptoms worsen or if the condition fails to improve as anticipated.  I provided 20 minutes of non-face-to-face time during this encounter.   Rachel Ruder, MD  [facility] MD/PA/NP OP Progress Note  07/18/2023 9:17 AM Rachel Sutton  MRN:  [id number]  Chief Complaint:  Chief Complaint  Patient presents with   Depression   Anxiety   Follow-up   HPI: This patient is a 62 year old white female who is living in [place] with her husband.  She works as an Airline pilot for [employer].  The patient returns for follow-up after 3 months.  She states that she is doing a little bit better.  She has gotten used to driving into work even though it is a 2-1/2-hour drive each way.  Her main concern is that she was diagnosed with diabetes a few days ago when her rheumatologist found that her blood sugar was over 200.  She has seen her PCP and is now on metformin 500 mg daily.  Right now it is causing diarrhea but she is using Imodium.  She is also going to work on her diet.  She is somewhat stressed because her stepdaughter is trying to sell her for money she gained when she sold her former home and land.  The stepdaughter is claiming that had actually  been in her father's name.  The patient maintains that everything was in her name.  She is confident that she will get this dismissed but is still very stressful.  The patient is sleeping better since we increase the Xanax.  She denies significant depression and feels that her anxiety is under good control.  She denies thoughts of self-harm or suicide Visit Diagnosis:    ICD-10-CM   1. Recurrent major depressive disorder, in partial remission (HCC)  F33.41       Past Psychiatric History: Long-term outpatient treatment  Past Medical History:  Past Medical History:  Diagnosis Date   Anxiety    Arthritis    Breast cancer (HCC) 02/22/2012   Right T1c, N0   C. difficile colitis    Colitis, collagenous 09/22/2015   Depression    Essential hypertension    Family history of brain cancer    Family history of esophageal cancer    Family history of lung cancer    Family history of lymphoma    Family history of pancreatic cancer    Fuchs' corneal dystrophy    GERD (gastroesophageal reflux disease)    Hypertension    Mitral valve prolapse    Mild mitral regurgitation   Mixed hyperlipidemia    OSA (obstructive sleep apnea)    Peripheral neuropathy    Seizures (HCC)    Only with dose of haldol; no more since that was stopped.  Past Surgical History:  Procedure Laterality Date   ABDOMINAL HYSTERECTOMY  1999   BACK SURGERY     BIOPSY N/A 09/28/2015   Procedure: BIOPSY;  Surgeon: West Bali, MD;  Location: [facility];  Service: Endoscopy;  Laterality: N/A;   BIOPSY  04/05/2016   Procedure: BIOPSY;  Surgeon: West Bali, MD;  Location: [facility];  Service: Endoscopy;;  colon bx   BREAST LUMPECTOMY  10/30/2002   right   BREAST RECONSTRUCTION WITH PLACEMENT OF TISSUE EXPANDER AND FLEX HD (ACELLULAR HYDRATED DERMIS) Right 09/07/2019   Procedure: RIGHT BREAST RECONSTRUCTION WITH PLACEMENT OF TISSUE EXPANDER AND FLEX HD (ACELLULAR HYDRATED DERMIS);  Surgeon: Peggye Form, DO;   Location: [facility];  Service: Plastics;  Laterality: Right;   CARDIAC CATHETERIZATION     Carpal tunnel R/L  1992   Cervical spine fusion X2 since last visit     COLONOSCOPY WITH PROPOFOL N/A 09/28/2015   Procedure: COLONOSCOPY WITH PROPOFOL;  Surgeon: West Bali, MD;  Location: [facility];  Service: Endoscopy;  Laterality: N/A;  procedure 1 cecum time in 1232  time out 1246   total time 14 mintes   COLONOSCOPY WITH PROPOFOL N/A 04/05/2016   Procedure: COLONOSCOPY WITH PROPOFOL;  Surgeon: West Bali, MD;  [facility];  Service: Endoscopy;  Laterality: N/A;  1200   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   DEBRIDEMENT AND CLOSURE WOUND Right 02/17/2020   Procedure: Excision of right breast wound with closure and provena placement;  Surgeon: Peggye Form, DO;  Location: [facility];  Service: Plastics;  Laterality: Right;   ELBOW SURGERY Right  tennis elbow release 2012   ESOPHAGOGASTRODUODENOSCOPY  10/26/2012   Procedure: ESOPHAGOGASTRODUODENOSCOPY (EGD);  Surgeon: Louis Meckel, MD;  Location: [facility] ENDOSCOPY;  Service: Endoscopy;  Laterality: N/A;   ESOPHAGOGASTRODUODENOSCOPY (EGD) WITH PROPOFOL N/A 09/28/2015   Procedure: ESOPHAGOGASTRODUODENOSCOPY (EGD) WITH PROPOFOL;  Surgeon: West Bali, MD;  Location: [facility];  Service: Endoscopy;  Laterality: N/A;   EYE SURGERY  2018   corneal transplants bilateral   KNEE ARTHROSCOPY Left    LEFT HEART CATHETERIZATION WITH CORONARY ANGIOGRAM N/A 10/23/2012   Procedure: LEFT HEART CATHETERIZATION WITH CORONARY ANGIOGRAM;  Surgeon: Peter M Swaziland, MD;  Location: [facility] CATH LAB;  Service: Cardiovascular;  Laterality: N/A;   MASTECTOMY Right    MASTECTOMY W/ SENTINEL NODE BIOPSY Right 09/07/2019   Procedure: RIGHT MASTECTOMY WITH SENTINEL LYMPH NODE BIOPSY;  Surgeon: Griselda Miner, MD;  Location: [facility];  Service: General;  Laterality: Right;   OVARIAN CYST REMOVAL     prior to hysterectomy, right   Ray cage fusion  1997    REMOVAL OF TISSUE EXPANDER Right 12/31/2019   Procedure: REMOVAL OF TISSUE EXPANDER AND EXCESS SKIN;  Surgeon: Peggye Form, DO;  Location: [facility];  Service: Plastics;  Laterality: Right;  1 hour   SENTINEL LYMPH NODE BIOPSY  10/30/2002   right   TONSILLECTOMY     at six months old    Family Psychiatric History: See below  Family History:  Family History  Problem Relation Age of Onset   Diabetes Mother    Pancreatic cancer Mother 39   Lung cancer Father    Diabetes Father    Heart disease Father    Esophageal cancer Father 86       Beer drinker   Schizophrenia Sister    Cancer Maternal Uncle  unknown form of cancer; cigar smoker   Lung cancer Maternal Grandmother    Brain cancer Maternal Grandfather    Heart disease Paternal Grandfather    Brain cancer Cousin        dx in his late 12s-40s   Brain cancer Brother 62   Lymphoma Niece 84   Anesthesia problems Neg Hx    Hypotension Neg Hx    Malignant hyperthermia Neg Hx    Pseudochol deficiency Neg Hx    Colon cancer Neg Hx    Colon polyps Neg Hx    Crohn's disease Neg Hx    Ulcerative colitis Neg Hx    Breast cancer Neg Hx     Social History:  Social History   Socioeconomic History   Marital status: Married    Spouse name: Not on file   Number of children: 2   Years of education: Not on file   Highest education level: Not on file  Occupational History   Occupation: Agricultural engineer: [employer]    Comment: [employer]  Tobacco Use   Smoking status: Every Day    Current packs/day: 0.00    Average packs/day: 0.3 packs/day for 45.0 years (13.5 ttl pk-yrs)    Types: Cigarettes    Start date: 07/07/1974    Last attempt to quit: 07/14/2019    Years since quitting: 4.0   Smokeless tobacco: Never  Vaping Use   Vaping status: Never Used  Substance and Sexual Activity   Alcohol use: Not Currently    Alcohol/week: 0.0 standard drinks of alcohol    Comment: stopped July 26, 2014)    Drug use: No   Sexual activity: Not Currently    Birth control/protection: Surgical  Other Topics Concern   Not on file  Social History Narrative   Not on file   Social Determinants of Health   Financial Resource Strain: Not on file  Food Insecurity: Not on file  Transportation Needs: Not on file  Physical Activity: Not on file  Stress: Not on file  Social Connections: Not on file    Allergies:  Allergies  Allergen Reactions   Bee Venom Anaphylaxis   Haloperidol Lactate Other (See Comments)    Convulsions    Meperidine Hcl Hives   Xifaxan [Rifaximin] Diarrhea and Other (See Comments)    Constantly felt the need to use the bathroom     Metabolic Disorder Labs: No results found for: "HGBA1C", "MPG" No results found for: "PROLACTIN" Lab Results  Component Value Date   CHOL 207 (H) 10/23/2012   TRIG 319 (H) 10/23/2012   HDL 33 (L) 10/23/2012   CHOLHDL 6.3 10/23/2012   VLDL 64 (H) 10/23/2012   LDLCALC 110 (H) 10/23/2012   Lab Results  Component Value Date   TSH 0.706 09/22/2015    Therapeutic Level Labs: No results found for: "LITHIUM" No results found for: "VALPROATE" No results found for: "CBMZ"  Current Medications: Current Outpatient Medications  Medication Sig Dispense Refill   metFORMIN (GLUCOPHAGE) 500 MG tablet Take 500 mg by mouth 2 (two) times daily.     acetaminophen (TYLENOL) 500 MG tablet Take 1,000 mg by mouth every 6 (six) hours as needed for moderate pain or headache.     ALPRAZolam (XANAX) 0.5 MG tablet Take 1 tablet (0.5 mg total) by mouth 3 (three) times daily. 1 tablet by mouth in the morning and 2 tablets by mouth at bed time 90 tablet 2   Calcium-Magnesium-Vitamin D (CALCIUM 500 PO) Take 1  tablet by mouth daily.      Dexlansoprazole 30 MG capsule DR Take 1 capsule by mouth daily.     diclofenac sodium (VOLTAREN) 1 % GEL Apply 1 application topically 3 (three) times daily as needed (pain).      EPINEPHrine (EPI-PEN) 0.3 mg/0.3 mL DEVI  Inject 0.3 mg into the muscle as needed (bee stings).      fish oil-omega-3 fatty acids 1000 MG capsule Take 1 g by mouth every morning.      gabapentin (NEURONTIN) 300 MG capsule Take 300 mg by mouth 3 (three) times daily.     hydrochlorothiazide (MICROZIDE) 12.5 MG capsule TAKE 1 CAPSULE DAILY 90 capsule 3   hydroxychloroquine (PLAQUENIL) 200 MG tablet Take 400 mg by mouth daily.     leflunomide (ARAVA) 20 MG tablet Take 20 mg by mouth daily.     metoprolol succinate (TOPROL-XL) 50 MG 24 hr tablet TAKE 1 TABLET TWICE A DAY  WITH OR IMMEDIATELY        FOLLOWING A MEAL 180 tablet 2   Multiple Vitamin (MULITIVITAMIN WITH MINERALS) TABS Take 1 tablet by mouth every morning.     NIFEdipine (PROCARDIA XL/NIFEDICAL-XL) 90 MG 24 hr tablet TAKE 1 TABLET DAILY 90 tablet 3   nitroGLYCERIN (NITROSTAT) 0.4 MG SL tablet Place 1 tablet (0.4 mg total) under the tongue every 5 (five) minutes x 3 doses as needed for chest pain. 25 tablet 3   pantoprazole (PROTONIX) 40 MG tablet TAKE 1 TABLET DAILY 90 tablet 3   pravastatin (PRAVACHOL) 20 MG tablet Take 20 mg by mouth daily.     predniSONE (DELTASONE) 10 MG tablet Take 10 mg by mouth. As needed for gout flare up     sertraline (ZOLOFT) 25 MG tablet Take 1 tablet (25 mg total) by mouth daily. 90 tablet 2   sulfaSALAzine (AZULFIDINE) 500 MG tablet Take 500 mg by mouth daily.     tamoxifen (NOLVADEX) 20 MG tablet TAKE 1 TABLET DAILY 90 tablet 1   Current Facility-Administered Medications  Medication Dose Route Frequency Provider Last Rate Last Admin   betamethasone acetate-betamethasone sodium phosphate (CELESTONE) injection 3 mg  3 mg Intramuscular Once Felecia Shelling, DPM         Musculoskeletal: Strength & Muscle Tone: within normal limits Gait & Station: normal Patient leans: N/A  Psychiatric Specialty Exam: Review of Systems  Musculoskeletal:  Positive for arthralgias.  All other systems reviewed and are negative.   There were no vitals taken for  this visit.There is no height or weight on file to calculate BMI.  General Appearance: Casual and Fairly Groomed  Eye Contact:  Good  Speech:  Clear and Coherent  Volume:  Normal  Mood:  Euthymic  Affect:  Congruent  Thought Process:  Goal Directed  Orientation:  Full (Time, Place, and Person)  Thought Content: WDL   Suicidal Thoughts:  No  Homicidal Thoughts:  No  Memory:  Immediate;   Good Recent;   Good Remote;   Good  Judgement:  Good  Insight:  Good  Psychomotor Activity:  Normal  Concentration:  Concentration: Good and Attention Span: Good  Recall:  Good  Fund of Knowledge: Good  Language: Good  Akathisia:  No  Handed:  Right  AIMS (if indicated): not done  Assets:  Communication Skills Desire for Improvement Resilience Social Support Talents/Skills  ADL's:  Intact  Cognition: WNL  Sleep:  Good   Screenings: GAD-7    Flowsheet Row Office Visit from 07/11/2022  in [facility]  Total GAD-7 Score 3      PHQ2-9    Flowsheet Row Office Visit from 07/11/2022 in [facility] at [place] Video Visit from 06/07/2022 in [facility] at [place] Video Visit from 12/07/2021 in [facility] at [place] Video Visit from 09/07/2021 in [facility] at [place] Video Visit from 08/10/2021 in [facility] at [facility] Total Score 1 0 0 0 1  PHQ-9 Total Score 7 -- -- -- --      Flowsheet Row Office Visit from 07/11/2022 in [facility] at [place] Video Visit from 06/07/2022 in [facility] at [place] Video Visit from 12/07/2021 in [facility] at [place]  C-SSRS RISK CATEGORY No Risk No Risk No Risk        Assessment and Plan: This patient is a 62 year old female with a history of depression  and anxiety.  She is doing well despite recent stressors.  She will continue Xanax 0.5 mg in the morning and 1 mg at bedtime to help with anxiety and sleep and Zoloft 25 mg daily for depression and anxiety.  She will return to see me in 3 months.  Collaboration of Care: Collaboration of Care: Primary Care Provider AEB notes will be shared with PCP at patient's request  Patient/Guardian was advised Release of Information must be obtained prior to any record release in order to collaborate their care with an outside provider. Patient/Guardian was advised if they have not already done so to contact the registration department to sign all necessary forms in order for [place] to release information regarding their care.   Consent: Patient/Guardian gives verbal consent for treatment and assignment of benefits for services provided during this visit. Patient/Guardian expressed understanding and agreed to proceed.    Rachel Ruder, MD 07/18/2023, 9:17 AM

## 2023-07-30 ENCOUNTER — Other Ambulatory Visit: Payer: Self-pay

## 2023-10-14 ENCOUNTER — Encounter (HOSPITAL_COMMUNITY): Payer: Self-pay | Admitting: Psychiatry

## 2023-10-14 ENCOUNTER — Telehealth (HOSPITAL_COMMUNITY): Payer: Commercial Managed Care - PPO | Admitting: Psychiatry

## 2023-10-14 DIAGNOSIS — F3341 Major depressive disorder, recurrent, in partial remission: Secondary | ICD-10-CM

## 2023-10-14 MED ORDER — ALPRAZOLAM 0.5 MG PO TABS: ORAL_TABLET | ORAL | 2 refills | Status: DC

## 2023-10-14 MED ORDER — SERTRALINE HCL 25 MG PO TABS: 25.0000 mg | ORAL_TABLET | Freq: Every day | ORAL | 2 refills | Status: DC

## 2023-10-14 NOTE — Progress Notes (Signed)
Virtual Visit via Video Note  I connected with Rachel Sutton on 10/17/23 at 11:20 AM EDT by a video enabled telemedicine application and verified that I am speaking with the correct person using two identifiers.  Location: Patient: home Provider: office   I discussed the limitations of evaluation and management by telemedicine and the availability of in person appointments. The patient expressed understanding and agreed to proceed.      I discussed the assessment and treatment plan with the patient. The patient was provided an opportunity to ask questions and all were answered. The patient agreed with the plan and demonstrated an understanding of the instructions.   The patient was advised to call back or seek an in-person evaluation if the symptoms worsen or if the condition fails to improve as anticipated.  I provided 20 minutes of non-face-to-face time during this encounter.   [name], MD  [facility] MD/PA/NP OP Progress Note  10/14/2023 11:29 AM Rachel Sutton  MRN:  [id number]  Chief Complaint:  Chief Complaint  Patient presents with   Depression   Anxiety   Follow-up   HPI: This patient is a 62 year old white female who is living in [place] with her husband. She works as an Airline pilot for [employer].   The patient returns for follow-up after 3 months.  She states overall she is doing well.  She is sleeping well.  She is keeping her blood sugar under control.  She states that her mood is stable and she denies significant depression or thoughts of self-harm or suicidal ideation.  She feels that her anxiety is under good control Visit Diagnosis:    ICD-10-CM   1. Recurrent major depressive disorder, in partial remission (HCC)  F33.41       Past Psychiatric History: Long-term outpatient treatment  Past Medical History:  Past Medical History:  Diagnosis Date   Anxiety    Arthritis    Breast cancer (HCC) 02/22/2012   Right T1c, N0   C. difficile  colitis    Colitis, collagenous 09/22/2015   Depression    Essential hypertension    Family history of brain cancer    Family history of esophageal cancer    Family history of lung cancer    Family history of lymphoma    Family history of pancreatic cancer    Fuchs' corneal dystrophy    GERD (gastroesophageal reflux disease)    Hypertension    Mitral valve prolapse    Mild mitral regurgitation   Mixed hyperlipidemia    OSA (obstructive sleep apnea)    Peripheral neuropathy    Seizures (HCC)    Only with dose of haldol; no more since that was stopped.    Past Surgical History:  Procedure Laterality Date   ABDOMINAL HYSTERECTOMY  1999   BACK SURGERY     BIOPSY N/A 09/28/2015   Procedure: BIOPSY;  Surgeon: West Bali, MD;  Location: [facility];  Service: Endoscopy;  Laterality: N/A;   BIOPSY  04/05/2016   Procedure: BIOPSY;  Surgeon: West Bali, MD;  Location: [facility];  Service: Endoscopy;;  colon bx   BREAST LUMPECTOMY  10/30/2002   right   BREAST RECONSTRUCTION WITH PLACEMENT OF TISSUE EXPANDER AND FLEX HD (ACELLULAR HYDRATED DERMIS) Right 09/07/2019   Procedure: RIGHT BREAST RECONSTRUCTION WITH PLACEMENT OF TISSUE EXPANDER AND FLEX HD (ACELLULAR HYDRATED DERMIS);  Surgeon: Peggye Form, DO;  Location: [facility];  Service: Plastics;  Laterality: Right;   CARDIAC CATHETERIZATION  Carpal tunnel R/L  1992   Cervical spine fusion X2 since last visit     COLONOSCOPY WITH PROPOFOL N/A 09/28/2015   Procedure: COLONOSCOPY WITH PROPOFOL;  Surgeon: West Bali, MD;  Location: [facility];  Service: Endoscopy;  Laterality: N/A;  procedure 1 cecum time in 1232  time out 1246   total time 14 mintes   COLONOSCOPY WITH PROPOFOL N/A 04/05/2016   Procedure: COLONOSCOPY WITH PROPOFOL;  Surgeon: West Bali, MD;  [facility]: [facility];  Service: Endoscopy;  Laterality: N/A;  1200   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   DEBRIDEMENT AND CLOSURE WOUND Right 02/17/2020    Procedure: Excision of right breast wound with closure and provena placement;  Surgeon: Peggye Form, DO;  Location: [facility];  Service: Plastics;  Laterality: Right;   ELBOW SURGERY Right  tennis elbow release 2012   ESOPHAGOGASTRODUODENOSCOPY  10/26/2012   Procedure: ESOPHAGOGASTRODUODENOSCOPY (EGD);  Surgeon: Louis Meckel, MD;  Location[facility];  Service: Endoscopy;  Laterality: N/A;   ESOPHAGOGASTRODUODENOSCOPY (EGD) WITH PROPOFOL N/A 09/28/2015   Procedure: ESOPHAGOGASTRODUODENOSCOPY (EGD) WITH PROPOFOL;  Surgeon: West Bali, MD;  Location: [facility];  Service: Endoscopy;  Laterality: N/A;   EYE SURGERY  2018   corneal transplants bilateral   KNEE ARTHROSCOPY Left    LEFT HEART CATHETERIZATION WITH CORONARY ANGIOGRAM N/A 10/23/2012   Procedure: LEFT HEART CATHETERIZATION WITH CORONARY ANGIOGRAM;  Surgeon: Peter M Swaziland, MD;  Location: [facility] CATH LAB;  Service: Cardiovascular;  Laterality: N/A;   MASTECTOMY Right    MASTECTOMY W/ SENTINEL NODE BIOPSY Right 09/07/2019   Procedure: RIGHT MASTECTOMY WITH SENTINEL LYMPH NODE BIOPSY;  Surgeon: Griselda Miner, MD;  Location: [facility];  Service: General;  Laterality: Right;   OVARIAN CYST REMOVAL     prior to hysterectomy, right   Ray cage fusion  1997   REMOVAL OF TISSUE EXPANDER Right 12/31/2019   Procedure: REMOVAL OF TISSUE EXPANDER AND EXCESS SKIN;  Surgeon: Peggye Form, DO;  Location: [facility];  Service: Plastics;  Laterality: Right;  1 hour   SENTINEL LYMPH NODE BIOPSY  10/30/2002   right   TONSILLECTOMY     at six months old    Family Psychiatric History: See below  Family History:  Family History  Problem Relation Age of Onset   Diabetes Mother    Pancreatic cancer Mother 61   Lung cancer Father    Diabetes Father    Heart disease Father    Esophageal cancer Father 15       Beer drinker   Schizophrenia Sister    Cancer Maternal Uncle        unknown form of cancer; cigar  smoker   Lung cancer Maternal Grandmother    Brain cancer Maternal Grandfather    Heart disease Paternal Grandfather    Brain cancer Cousin        dx in his late 28s-40s   Brain cancer Brother 41   Lymphoma Niece 69   Anesthesia problems Neg Hx    Hypotension Neg Hx    Malignant hyperthermia Neg Hx    Pseudochol deficiency Neg Hx    Colon cancer Neg Hx    Colon polyps Neg Hx    Crohn's disease Neg Hx    Ulcerative colitis Neg Hx    Breast cancer Neg Hx     Social History:  Social History   Socioeconomic History   Marital status: Married  Spouse name: Not on file   Number of children: 2   Years of education: Not on file   Highest education level: Not on file  Occupational History   Occupation: Agricultural engineer: [employer]    Comment: [employer]  Tobacco Use   Smoking status: Every Day    Current packs/day: 0.00    Average packs/day: 0.3 packs/day for 45.0 years (13.5 ttl pk-yrs)    Types: Cigarettes    Start date: 07/07/1974    Last attempt to quit: 07/14/2019    Years since quitting: 4.2   Smokeless tobacco: Never  Vaping Use   Vaping status: Never Used  Substance and Sexual Activity   Alcohol use: Not Currently    Alcohol/week: 0.0 standard drinks of alcohol    Comment: stopped July 26, 2014)   Drug use: No   Sexual activity: Not Currently    Birth control/protection: Surgical  Other Topics Concern   Not on file  Social History Narrative   Not on file   Social Determinants of Health   Financial Resource Strain: Not on file  Food Insecurity: Not on file  Transportation Needs: Not on file  Physical Activity: Not on file  Stress: Not on file  Social Connections: Not on file    Allergies:  Allergies  Allergen Reactions   Bee Venom Anaphylaxis   Haloperidol Lactate Other (See Comments)    Convulsions    Meperidine Hcl Hives   Xifaxan [Rifaximin] Diarrhea and Other (See Comments)    Constantly felt the need to use the bathroom     Metabolic  Disorder Labs: No results found for: "HGBA1C", "MPG" No results found for: "PROLACTIN" Lab Results  Component Value Date   CHOL 207 (H) 10/23/2012   TRIG 319 (H) 10/23/2012   HDL 33 (L) 10/23/2012   CHOLHDL 6.3 10/23/2012   VLDL 64 (H) 10/23/2012   LDLCALC 110 (H) 10/23/2012   Lab Results  Component Value Date   TSH 0.706 09/22/2015    Therapeutic Level Labs: No results found for: "LITHIUM" No results found for: "VALPROATE" No results found for: "CBMZ"  Current Medications: Current Outpatient Medications  Medication Sig Dispense Refill   acetaminophen (TYLENOL) 500 MG tablet Take 1,000 mg by mouth every 6 (six) hours as needed for moderate pain or headache.     ALPRAZolam (XANAX) 0.5 MG tablet 1 tablet by mouth in the morning and 2 tablets by mouth at bed time 90 tablet 2   Calcium-Magnesium-Vitamin D (CALCIUM 500 PO) Take 1 tablet by mouth daily.      Dexlansoprazole 30 MG capsule DR Take 1 capsule by mouth daily.     diclofenac sodium (VOLTAREN) 1 % GEL Apply 1 application topically 3 (three) times daily as needed (pain).      EPINEPHrine (EPI-PEN) 0.3 mg/0.3 mL DEVI Inject 0.3 mg into the muscle as needed (bee stings).      fish oil-omega-3 fatty acids 1000 MG capsule Take 1 g by mouth every morning.      gabapentin (NEURONTIN) 300 MG capsule Take 300 mg by mouth 3 (three) times daily.     hydrochlorothiazide (MICROZIDE) 12.5 MG capsule TAKE 1 CAPSULE DAILY 90 capsule 3   hydroxychloroquine (PLAQUENIL) 200 MG tablet Take 400 mg by mouth daily.     leflunomide (ARAVA) 20 MG tablet Take 20 mg by mouth daily.     metFORMIN (GLUCOPHAGE) 500 MG tablet Take 500 mg by mouth 2 (two) times daily.     metoprolol  succinate (TOPROL-XL) 50 MG 24 hr tablet TAKE 1 TABLET TWICE A DAY  WITH OR IMMEDIATELY        FOLLOWING A MEAL 180 tablet 2   Multiple Vitamin (MULITIVITAMIN WITH MINERALS) TABS Take 1 tablet by mouth every morning.     NIFEdipine (PROCARDIA XL/NIFEDICAL-XL) 90 MG 24 hr  tablet TAKE 1 TABLET DAILY 90 tablet 3   nitroGLYCERIN (NITROSTAT) 0.4 MG SL tablet Place 1 tablet (0.4 mg total) under the tongue every 5 (five) minutes x 3 doses as needed for chest pain. 25 tablet 3   pantoprazole (PROTONIX) 40 MG tablet TAKE 1 TABLET DAILY 90 tablet 3   pravastatin (PRAVACHOL) 20 MG tablet Take 20 mg by mouth daily.     predniSONE (DELTASONE) 10 MG tablet Take 10 mg by mouth. As needed for gout flare up     sertraline (ZOLOFT) 25 MG tablet Take 1 tablet (25 mg total) by mouth daily. 90 tablet 2   sulfaSALAzine (AZULFIDINE) 500 MG tablet Take 500 mg by mouth daily.     tamoxifen (NOLVADEX) 20 MG tablet TAKE 1 TABLET DAILY 90 tablet 1   Current Facility-Administered Medications  Medication Dose Route Frequency Provider Last Rate Last Admin   betamethasone acetate-betamethasone sodium phosphate (CELESTONE) injection 3 mg  3 mg Intramuscular Once Felecia Shelling, DPM         Musculoskeletal: Strength & Muscle Tone: within normal limits Gait & Station: normal Patient leans: N/A  Psychiatric Specialty Exam: Review of Systems  All other systems reviewed and are negative.   There were no vitals taken for this visit.There is no height or weight on file to calculate BMI.  General Appearance: Casual and Fairly Groomed  Eye Contact:  Good  Speech:  Clear and Coherent  Volume:  Normal  Mood:  Euthymic  Affect:  Congruent  Thought Process:  Goal Directed  Orientation:  Full (Time, Place, and Person)  Thought Content: WDL   Suicidal Thoughts:  No  Homicidal Thoughts:  No  Memory:  Immediate;   Good Recent;   Good Remote;   Fair  Judgement:  Good  Insight:  Good  Psychomotor Activity:  Normal  Concentration:  Concentration: Good and Attention Span: Good  Recall:  Good  Fund of Knowledge: Good  Language: Good  Akathisia:  No  Handed:  Right  AIMS (if indicated): not done  Assets:  Communication Skills Desire for Improvement Resilience Social  Support Talents/Skills  ADL's:  Intact  Cognition: WNL  Sleep:  Good   Screenings: GAD-7    Flowsheet Row Office Visit from 07/11/2022 in [facility] [place]  Total GAD-7 Score 3      PHQ2-9    Flowsheet Row Office Visit from 07/11/2022 in [facility] [place] Video Visit from 06/07/2022 in [facility] Video Visit from 12/07/2021 in [facility] at [place] Video Visit from 09/07/2021 in [facility] [place] Video Visit from 08/10/2021 in [facility] at [facility] Total Score 1 0 0 0 1  PHQ-9 Total Score 7 -- -- -- --      Flowsheet Row Office Visit from 07/11/2022 in [facility] [place] Video Visit from 06/07/2022 in [facility] at [place] Video Visit from 12/07/2021 in [facility] at [facility] RISK CATEGORY No Risk No Risk No Risk  Assessment and Plan: This patient is a 62 year old female with a history of depression and anxiety.  She continues to do well on her current regimen.  She will continue Xanax 0.5 mg in the morning and 1 milligram at bedtime for anxiety and sleep and Zoloft 25 mg daily for depression and anxiety.  She will return to see me in 3 months  Collaboration of Care: Collaboration of Care: Primary Care Provider AEB notes are shared with PCP at patient's request  Patient/Guardian was advised Release of Information must be obtained prior to any record release in order to collaborate their care with an outside provider. Patient/Guardian was advised if they have not already done so to contact the registration department to sign all necessary forms in order for [place] to release information regarding their care.   Consent: Patient/Guardian gives verbal consent  for treatment and assignment of benefits for services provided during this visit. Patient/Guardian expressed understanding and agreed to proceed.    [name], MD 10/14/2023, 11:29 AM

## 2023-10-25 ENCOUNTER — Telehealth: Payer: Self-pay | Admitting: Hematology

## 2023-11-02 ENCOUNTER — Other Ambulatory Visit: Payer: Self-pay | Admitting: Hematology

## 2023-11-06 NOTE — Telephone Encounter (Signed)
Called and spoke with patient to advise unable to move times up for scheduled appts. Patient is okay and confirm will come to scheduled appts.

## 2023-11-19 ENCOUNTER — Inpatient Hospital Stay: Payer: Commercial Managed Care - PPO | Admitting: Hematology

## 2023-11-19 ENCOUNTER — Inpatient Hospital Stay: Payer: Commercial Managed Care - PPO | Attending: Nurse Practitioner

## 2023-11-19 ENCOUNTER — Encounter: Payer: Self-pay | Admitting: Nurse Practitioner

## 2023-11-19 ENCOUNTER — Encounter: Payer: Self-pay | Admitting: Hematology

## 2023-11-19 ENCOUNTER — Inpatient Hospital Stay: Payer: Commercial Managed Care - PPO

## 2023-11-19 VITALS — BP 143/79 | HR 81 | Temp 98.0°F | Resp 18 | Wt 189.3 lb

## 2023-11-19 DIAGNOSIS — Z79811 Long term (current) use of aromatase inhibitors: Secondary | ICD-10-CM | POA: Insufficient documentation

## 2023-11-19 DIAGNOSIS — C50411 Malignant neoplasm of upper-outer quadrant of right female breast: Secondary | ICD-10-CM | POA: Diagnosis present

## 2023-11-19 DIAGNOSIS — M810 Age-related osteoporosis without current pathological fracture: Secondary | ICD-10-CM | POA: Diagnosis present

## 2023-11-19 DIAGNOSIS — Z1231 Encounter for screening mammogram for malignant neoplasm of breast: Secondary | ICD-10-CM

## 2023-11-19 DIAGNOSIS — Z170 Estrogen receptor positive status [ER+]: Secondary | ICD-10-CM | POA: Diagnosis not present

## 2023-11-19 LAB — CBC WITH DIFFERENTIAL (CANCER CENTER ONLY)
Abs Immature Granulocytes: 0.05 10*3/uL (ref 0.00–0.07)
Basophils Absolute: 0.1 10*3/uL (ref 0.0–0.1)
Basophils Relative: 1 %
Eosinophils Absolute: 0.3 10*3/uL (ref 0.0–0.5)
Eosinophils Relative: 4 %
HCT: 37.1 % (ref 36.0–46.0)
Hemoglobin: 12.6 g/dL (ref 12.0–15.0)
Immature Granulocytes: 1 %
Lymphocytes Relative: 41 %
Lymphs Abs: 3.1 10*3/uL (ref 0.7–4.0)
MCH: 30.2 pg (ref 26.0–34.0)
MCHC: 34 g/dL (ref 30.0–36.0)
MCV: 89 fL (ref 80.0–100.0)
Monocytes Absolute: 0.7 10*3/uL (ref 0.1–1.0)
Monocytes Relative: 9 %
Neutro Abs: 3.2 10*3/uL (ref 1.7–7.7)
Neutrophils Relative %: 44 %
Platelet Count: 274 10*3/uL (ref 150–400)
RBC: 4.17 MIL/uL (ref 3.87–5.11)
RDW: 13.3 % (ref 11.5–15.5)
WBC Count: 7.4 10*3/uL (ref 4.0–10.5)
nRBC: 0 % (ref 0.0–0.2)

## 2023-11-19 LAB — CMP (CANCER CENTER ONLY)
ALT: 16 U/L (ref 0–44)
AST: 22 U/L (ref 15–41)
Albumin: 4 g/dL (ref 3.5–5.0)
Alkaline Phosphatase: 58 U/L (ref 38–126)
Anion gap: 9 (ref 5–15)
BUN: 14 mg/dL (ref 8–23)
CO2: 25 mmol/L (ref 22–32)
Calcium: 9.1 mg/dL (ref 8.9–10.3)
Chloride: 106 mmol/L (ref 98–111)
Creatinine: 0.7 mg/dL (ref 0.44–1.00)
GFR, Estimated: >60 mL/min (ref >60)
Glucose, Bld: 156 mg/dL — ABNORMAL HIGH (ref 70–99)
Potassium: 3.7 mmol/L (ref 3.5–5.1)
Sodium: 140 mmol/L (ref 135–145)
Total Bilirubin: 0.4 mg/dL (ref <1.2)
Total Protein: 6.7 g/dL (ref 6.5–8.1)

## 2023-11-19 MED ORDER — ZOLEDRONIC ACID 4 MG/100ML IV SOLN
4.0000 mg | Freq: Once | INTRAVENOUS | Status: AC
  Administered 2023-11-19: 4 mg via INTRAVENOUS
  Filled 2023-11-19: qty 100

## 2023-11-19 MED ORDER — SODIUM CHLORIDE 0.9 % IV SOLN: Freq: Once | INTRAVENOUS | Status: AC

## 2023-11-19 NOTE — Progress Notes (Signed)
[facility]   Telephone:([phone]) [phone] Fax:([phone]) [phone]   Clinic Follow up Note   Patient Care Team: Pa, [facility] as PCP - General [name], Illene Bolus, MD as PCP - Cardiology (Cardiology) Jodelle Gross, NP as Nurse Practitioner (Nurse Practitioner) Pershing Proud, RN as Oncology Nurse Navigator Donnelly Angelica, RN as Oncology Nurse Navigator Malachy Mood, MD as Consulting Physician (Hematology) Griselda Miner, MD as Consulting Physician (General Surgery) Antony Blackbird, MD as Consulting Physician (Radiation Oncology) Casimer Lanius, MD as Consulting Physician (Rheumatology) Pollyann Samples, NP as Nurse Practitioner (Nurse Practitioner)  Date of Service:  11/19/2023  CHIEF COMPLAINT: f/u of right breast cancer  CURRENT THERAPY:  Adjuvant tamoxifen  Assessment and Plan    Breast Cancer 62 year old with breast cancer, status post right mastectomy. Currently on tamoxifen without issues. Last mammogram in March 2024, next due in March 2025. No new abnormalities on physical exam. Discussed importance of regular mammograms and follow-up. - Order mammogram for March 2025 - Follow-up in six months with Kelwin Gibler  Rheumatoid Arthritis (RA) Recent RA flare-up affecting hands, feet, ankles, and toes, managed with a short course of steroids. Symptoms improved but experiences anxiety and hyperactivity with steroid use. Discussed risks of steroid use, including hyperglycemia and mood changes. Prefers short courses of steroids for flare-ups. - Continue current RA management - Monitor for steroid side effects  Type 2 Diabetes Mellitus On metformin for hyperglycemia. Steroid use may exacerbate hyperglycemia. Advised to follow a low-carb diet during steroid use. - Continue metformin - Advised low-carb diet during steroid use  Osteoporosis Receiving Zometa infusions. No dental issues reported. Taking calcium and vitamin D supplements. Discussed risk of  osteonecrosis of the jaw with Zometa and need for dental clearance. No adverse reactions to previous infusion. - Administer Zometa infusion at 12:15 PM - Continue calcium and vitamin D supplementation - Monitor calcium and kidney function before infusion.     Plan -Lab reviewed, will proceed to second dose of Zometa today and continue every 6 months for 2 more doses -I ordered a mammogram for March 2025 -Lab, follow-up and Zometa in 6 months    SUMMARY OF ONCOLOGIC HISTORY: Oncology History  Malignant neoplasm of upper-outer quadrant of right breast in female, estrogen receptor positive (HCC)  07/01/2019 Mammogram   Diagnostic mammogram 07/01/19  IMPRESSION: Suspicious mass in the 9:30 region of the right breast 9 cm from the nipple measuring 1.3 x 1.3 x 1.1 cm. Suspicious mass in the right axilla measuring 5 x 4 x 4 mm.   07/02/2019 Cancer Staging   Staging form: Breast, AJCC 8th Edition - Clinical stage from 07/02/2019: Stage IA (cT1c, cN0, cM0, G2, ER+, PR+, HER2: Equivocal) - Signed by Malachy Mood, MD on 07/07/2019   07/02/2019 Initial Biopsy   Diagnosis 07/02/19 1. Breast, right, needle core biopsy, 9:30 o'clock - INVASIVE DUCTAL CARCINOMA, GRADE 1-2. SEE NOTE - DUCTAL CARCINOMA IN SITU, INTERMEDIATE GRADE 2. Lymph node, needle/core biopsy, right axilla - LYMPH NODE, NEGATIVE FOR CARCINOMA   07/02/2019 Receptors her2   By immunohistochemistry, the tumor cells are EQUIVOCAL for Her2 (2+). HER2 by FISH will be PERFORMED and the RESULTS REPORTED SEPARATELY Estrogen Receptor: 100%, POSITIVE, STRONG STAINING INTENSITY Progesterone Receptor: 90%, POSITIVE, STRONG STAINING INTENSITY Proliferation Marker Ki67: 10%   07/06/2019 Initial Diagnosis   Malignant neoplasm of upper-outer quadrant of right breast in female, estrogen receptor positive (HCC)   07/08/2019 -  Anti-estrogen oral therapy   Anastrozole 1mg  once daily  starting 07/08/19 before surgery.    07/20/2019 Genetic Testing    Negative genetic testing on the [facility] Multi-Cancer Panel. The report date is 07/20/2019.  The Multi-Gene Panel offered by [facility] includes sequencing and/or deletion duplication testing of the following 85 genes: AIP, ALK, APC, ATM, AXIN2,BAP1,  BARD1, BLM, BMPR1A, BRCA1, BRCA2, BRIP1, CASR, CDC73, CDH1, CDK4, CDKN1B, CDKN1C, CDKN2A (p14ARF), CDKN2A (p16INK4a), CEBPA, CHEK2, CTNNA1, DICER1, DIS3L2, EGFR (c.2369C>T, p.Thr790Met variant only), EPCAM (Deletion/duplication testing only), FH, FLCN, GATA2, GPC3, GREM1 (Promoter region deletion/duplication testing only), HOXB13 (c.251G>A, p.Gly84Glu), HRAS, KIT, MAX, MEN1, MET, MITF (c.952G>A, p.Glu318Lys variant only), MLH1, MSH2, MSH3, MSH6, MUTYH, NBN, NF1, NF2, NTHL1, PALB2, PDGFRA, PHOX2B, PMS2, POLD1, POLE, POT1, PRKAR1A, PTCH1, PTEN, RAD50, RAD51C, RAD51D, RB1, RECQL4, RET, RNF43, RUNX1, SDHAF2, SDHA (sequence changes only), SDHB, SDHC, SDHD, SMAD4, SMARCA4, SMARCB1, SMARCE1, STK11, SUFU, TERC, TERT, TMEM127, TP53, TSC1, TSC2, VHL, WRN and WT1.     08/29/2019 Surgery   RIGHT MASTECTOMY WITH SENTINEL LYMPH NODE BIOPSY and RECONSTRUCTION WITH PLACEMENT OF TISSUE EXPANDER AND FLEX HD (ACELLULAR HYDRATED DERMIS) by Dr. Carolynne Edouard and Dr Ulice Bold  09/07/19   09/07/2019 Pathology Results   DIAGNOSIS: 09/07/19  A. SENTINEL LYMPH NODE, RIGHT # 1, BIOPSY:  - One lymph node, negative for carcinoma (0/1).   B. SENTINEL LYMPH NODE, RIGHT # 2, BIOPSY:  - One lymph node, negative for carcinoma (0/1).   C. BREAST, RIGHT, MASTECTOMY:  - Invasive ductal carcinoma, 1.8 cm, Nottingham grade 1 of 3.  - Margins of resection are not involved (Closest margin: 20 mm, deep).  - Ductal carcinoma in situ, focal.  - Biopsy site changes.  - See oncology table.    09/07/2019 Oncotype testing   Oncotype RS: 17, meaning there is 5% distant recurrence risk at 9 years with AI/tamoxifen alone. Indicating <1% chemo benefit    10/28/2019 Cancer Staging   Staging form: Breast, AJCC  8th Edition - Pathologic: Stage IA (pT1c, pN0, cM0, G1, ER+, PR+, HER2-, Oncotype DX score: 17) - Signed by Malachy Mood, MD on 10/28/2019   12/31/2019 Pathology Results   FINAL MICROSCOPIC DIAGNOSIS:  A. SKIN, RIGHT BREAST MASTECTOMY FLAP AND SCAR:  - Skin and subcutaneous tissue with fibrosis, mild inflammation and  focal giant cell reaction consistent with scar.  - No evidence of malignancy.    02/24/2020 Survivorship   SCP delivered by Santiago Glad, NP       Discussed the use of AI scribe software for clinical note transcription with the patient, who gave verbal consent to proceed.  History of Present Illness   The patient, a 63 year old female with a history of breast cancer, osteoporosis, and rheumatoid arthritis (RA), presents for a routine follow-up. She reports no issues with her current medication, tamoxifen, which she finds more tolerable than the previous anastrozole. She has been experiencing chronic pain due to arthritis and osteoporosis, and recently had a two-day flare-up of her RA, which was managed with a short course of steroids. The flare-up affected her hands, feet, ankles, and toes, but she reports feeling better at the time of the visit. She expresses discomfort with the side effects of the steroids, which make her feel anxious and hyper. She also has been started on metformin for high blood sugar, which was noted during her last visit six months ago. She is aware of the need to maintain a low-carb diet while on steroids to manage her blood sugar levels. She is due for a mammogram next year and has been receiving Zometa infusions  for osteoporosis. She is due for her second infusion after the current visit. She reports no dental issues, which is significant given the potential impact of her medication on jaw bones.         All other systems were reviewed with the patient and are negative.  MEDICAL HISTORY:  Past Medical History:  Diagnosis Date   Anxiety    Arthritis     Breast cancer (HCC) 02/22/2012   Right T1c, N0   C. difficile colitis    Colitis, collagenous 09/22/2015   Depression    Essential hypertension    Family history of brain cancer    Family history of esophageal cancer    Family history of lung cancer    Family history of lymphoma    Family history of pancreatic cancer    Fuchs' corneal dystrophy    GERD (gastroesophageal reflux disease)    Hypertension    Mitral valve prolapse    Mild mitral regurgitation   Mixed hyperlipidemia    OSA (obstructive sleep apnea)    Peripheral neuropathy    Seizures (HCC)    Only with dose of haldol; no more since that was stopped.    SURGICAL HISTORY: Past Surgical History:  Procedure Laterality Date   ABDOMINAL HYSTERECTOMY  1999   BACK SURGERY     BIOPSY N/A 09/28/2015   Procedure: BIOPSY;  Surgeon: West Bali, MD;  Location: [facility];  Service: Endoscopy;  Laterality: N/A;   BIOPSY  04/05/2016   Procedure: BIOPSY;  Surgeon: West Bali, MD;  Location: [facility];  Service: Endoscopy;;  colon bx   BREAST LUMPECTOMY  10/30/2002   right   BREAST RECONSTRUCTION WITH PLACEMENT OF TISSUE EXPANDER AND FLEX HD (ACELLULAR HYDRATED DERMIS) Right 09/07/2019   Procedure: RIGHT BREAST RECONSTRUCTION WITH PLACEMENT OF TISSUE EXPANDER AND FLEX HD (ACELLULAR HYDRATED DERMIS);  Surgeon: Peggye Form, DO;  Location: [facility];  Service: Plastics;  Laterality: Right;   CARDIAC CATHETERIZATION     Carpal tunnel R/L  1992   Cervical spine fusion X2 since last visit     COLONOSCOPY WITH PROPOFOL N/A 09/28/2015   Procedure: COLONOSCOPY WITH PROPOFOL;  Surgeon: West Bali, MD;  Location: [facility];  Service: Endoscopy;  Laterality: N/A;  procedure 1 cecum time in 1232  time out 1246   total time 14 mintes   COLONOSCOPY WITH PROPOFOL N/A 04/05/2016   Procedure: COLONOSCOPY WITH PROPOFOL;  Surgeon: West Bali, MD;  Location: [facility];  Service: Endoscopy;  Laterality: N/A;  1200   CORNEAL  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   DEBRIDEMENT AND CLOSURE WOUND Right 02/17/2020   Procedure: Excision of right breast wound with closure and provena placement;  Surgeon: Peggye Form, DO;  Location: [facility];  Service: Plastics;  Laterality: Right;   ELBOW SURGERY Right  tennis elbow release 2012   ESOPHAGOGASTRODUODENOSCOPY  10/26/2012   Procedure: ESOPHAGOGASTRODUODENOSCOPY (EGD);  Surgeon: Louis Meckel, MD;  Location: [facility] END[facility];  Service: Endoscopy;  Laterality: N/A;   ESOPHAGOGASTRODUODENOSCOPY (EGD) WITH PROPOFOL N/A 09/28/2015   Procedure: ESOPHAGOGASTRODUODENOSCOPY (EGD) WITH PROPOFOL;  Surgeon: West Bali, MD;  Location: [facility];  Service: Endoscopy;  Laterality: N/A;   EYE SURGERY  2018   corneal transplants bilateral   KNEE ARTHROSCOPY Left    LEFT HEART CATHETERIZATION WITH CORONARY ANGIOGRAM N/A 10/23/2012   Procedure: LEFT HEART CATHETERIZATION WITH CORONARY ANGIOGRAM;  Surgeon: Peter M Swaziland, MD;  Location[facility]  CATH LAB;  Service: Cardiovascular;  Laterality: N/A;   MASTECTOMY Right    MASTECTOMY W/ SENTINEL NODE BIOPSY Right 09/07/2019   Procedure: RIGHT MASTECTOMY WITH SENTINEL LYMPH NODE BIOPSY;  Surgeon: Griselda Miner, MD;  Location: [facility];  Service: General;  Laterality: Right;   OVARIAN CYST REMOVAL     prior to hysterectomy, right   Ray cage fusion  1997   REMOVAL OF TISSUE EXPANDER Right 12/31/2019   Procedure: REMOVAL OF TISSUE EXPANDER AND EXCESS SKIN;  Surgeon: Peggye Form, DO;  Location: [facility];  Service: Plastics;  Laterality: Right;  1 hour   SENTINEL LYMPH NODE BIOPSY  10/30/2002   right   TONSILLECTOMY     at six months old    I have reviewed the social history and family history with the patient and they are unchanged from previous note.  ALLERGIES:  is allergic to bee venom, haloperidol lactate, meperidine hcl, and xifaxan [rifaximin].  MEDICATIONS:  Current Outpatient Medications  Medication  Sig Dispense Refill   acetaminophen (TYLENOL) 500 MG tablet Take 1,000 mg by mouth every 6 (six) hours as needed for moderate pain or headache.     ALPRAZolam (XANAX) 0.5 MG tablet 1 tablet by mouth in the morning and 2 tablets by mouth at bed time 90 tablet 2   Calcium-Magnesium-Vitamin D (CALCIUM 500 PO) Take 1 tablet by mouth daily.      Dexlansoprazole 30 MG capsule DR Take 1 capsule by mouth daily.     diclofenac sodium (VOLTAREN) 1 % GEL Apply 1 application topically 3 (three) times daily as needed (pain).      EPINEPHrine (EPI-PEN) 0.3 mg/0.3 mL DEVI Inject 0.3 mg into the muscle as needed (bee stings).      fish oil-omega-3 fatty acids 1000 MG capsule Take 1 g by mouth every morning.      gabapentin (NEURONTIN) 300 MG capsule Take 300 mg by mouth 3 (three) times daily.     hydrochlorothiazide (MICROZIDE) 12.5 MG capsule TAKE 1 CAPSULE DAILY 90 capsule 3   hydroxychloroquine (PLAQUENIL) 200 MG tablet Take 400 mg by mouth daily.     leflunomide (ARAVA) 20 MG tablet Take 20 mg by mouth daily.     metFORMIN (GLUCOPHAGE) 500 MG tablet Take 500 mg by mouth 2 (two) times daily.     metoprolol succinate (TOPROL-XL) 50 MG 24 hr tablet TAKE 1 TABLET TWICE A DAY  WITH OR IMMEDIATELY        FOLLOWING A MEAL 180 tablet 2   Multiple Vitamin (MULITIVITAMIN WITH MINERALS) TABS Take 1 tablet by mouth every morning.     NIFEdipine (PROCARDIA XL/NIFEDICAL-XL) 90 MG 24 hr tablet TAKE 1 TABLET DAILY 90 tablet 3   nitroGLYCERIN (NITROSTAT) 0.4 MG SL tablet Place 1 tablet (0.4 mg total) under the tongue every 5 (five) minutes x 3 doses as needed for chest pain. 25 tablet 3   pantoprazole (PROTONIX) 40 MG tablet TAKE 1 TABLET DAILY 90 tablet 3   pravastatin (PRAVACHOL) 20 MG tablet Take 20 mg by mouth daily.     predniSONE (DELTASONE) 10 MG tablet Take 10 mg by mouth. As needed for gout flare up     sertraline (ZOLOFT) 25 MG tablet Take 1 tablet (25 mg total) by mouth daily. 90 tablet 2   sulfaSALAzine  (AZULFIDINE) 500 MG tablet Take 500 mg by mouth daily.     tamoxifen (NOLVADEX) 20 MG tablet TAKE 1 TABLET DAILY 90 tablet 1  Current Facility-Administered Medications  Medication Dose Route Frequency Provider Last Rate Last Admin   betamethasone acetate-betamethasone sodium phosphate (CELESTONE) injection 3 mg  3 mg Intramuscular Once Felecia Shelling, DPM        PHYSICAL EXAMINATION: ECOG PERFORMANCE STATUS: 0 - Asymptomatic  Vitals:   11/19/23 1045 11/19/23 1046  BP: (!) 153/86 (!) 143/79  Pulse: 81   Resp: 18   Temp: 98 F (36.7 C)   SpO2: 98%    Wt Readings from Last 3 Encounters:  11/19/23 189 lb 4.8 oz (85.9 kg)  05/21/23 187 lb 11.2 oz (85.1 kg)  01/29/23 186 lb 1.6 oz (84.4 kg)     GENERAL:alert, no distress and comfortable SKIN: skin color, texture, turgor are normal, no rashes or significant lesions EYES: normal, Conjunctiva are pink and non-injected, sclera clear NECK: supple, thyroid normal size, non-tender, without nodularity LYMPH:  no palpable lymphadenopathy in the cervical, axillary  LUNGS: clear to auscultation and percussion with normal breathing effort HEART: regular rate & rhythm and no murmurs and no lower extremity edema ABDOMEN:abdomen soft, non-tender and normal bowel sounds Musculoskeletal:no cyanosis of digits and no clubbing  NEURO: alert & oriented x 3 with fluent speech, no focal motor/sensory deficits BREAST: Status post right mastectomy, chest wall with scar tissue, no nodules palpated. Left breast without nodules or abnormalities.      LABORATORY DATA:  I have reviewed the data as listed    Latest Ref Rng & Units 11/19/2023   10:23 AM 05/21/2023    9:49 AM 11/20/2022    9:30 AM  CBC  WBC 4.0 - 10.5 K/uL 7.4  9.0  8.3   Hemoglobin 12.0 - 15.0 g/dL 27.7  82.4  23.5   Hematocrit 36.0 - 46.0 % 37.1  36.9  40.2   Platelets 150 - 400 K/uL 274  289  256         Latest Ref Rng & Units 11/19/2023   10:23 AM 05/21/2023    9:49 AM 11/20/2022     9:30 AM  CMP  Glucose 70 - 99 mg/dL 361  443  154   BUN 8 - 23 mg/dL 14  15  10    Creatinine 0.44 - 1.00 mg/dL 0.08  6.76  1.95   Sodium 135 - 145 mmol/L 140  139  141   Potassium 3.5 - 5.1 mmol/L 3.7  4.2  4.2   Chloride 98 - 111 mmol/L 106  104  105   CO2 22 - 32 mmol/L 25  29  28    Calcium 8.9 - 10.3 mg/dL 9.1  9.0  9.6   Total Protein 6.5 - 8.1 g/dL 6.7  7.0  7.3   Total Bilirubin <1.2 mg/dL 0.4  0.4  0.5   Alkaline Phos 38 - 126 U/L 58  68  68   AST 15 - 41 U/L 22  26  30    ALT 0 - 44 U/L 16  17  25        RADIOGRAPHIC STUDIES: I have personally reviewed the radiological images as listed and agreed with the findings in the report. No results found.    Orders Placed This Encounter  Procedures   MM Digital Screening Unilat L    Standing Status:   Future    Standing Expiration Date:   11/18/2024    Order Specific Question:   Reason for Exam (SYMPTOM  OR DIAGNOSIS REQUIRED)    Answer:   screening    Order Specific Question:  Preferred imaging location?    Answer:   [facility]   All questions were answered. The patient knows to call the clinic with any problems, questions or concerns. No barriers to learning was detected. The total time spent in the appointment was 25 minutes.     Malachy Mood, MD 11/19/2023

## 2023-11-19 NOTE — Patient Instructions (Signed)

## 2024-01-15 ENCOUNTER — Telehealth (HOSPITAL_COMMUNITY): Payer: Commercial Managed Care - PPO | Admitting: Psychiatry

## 2024-01-15 ENCOUNTER — Encounter (HOSPITAL_COMMUNITY): Payer: Self-pay | Admitting: Psychiatry

## 2024-01-15 DIAGNOSIS — F3341 Major depressive disorder, recurrent, in partial remission: Secondary | ICD-10-CM | POA: Diagnosis not present

## 2024-01-15 MED ORDER — SERTRALINE HCL 25 MG PO TABS: 25.0000 mg | ORAL_TABLET | Freq: Every day | ORAL | 2 refills | Status: DC

## 2024-01-15 MED ORDER — ALPRAZOLAM 0.5 MG PO TABS: ORAL_TABLET | ORAL | 3 refills | Status: DC

## 2024-01-15 NOTE — Progress Notes (Signed)
Virtual Visit via Video Note  I connected with Rachel Sutton on 01/15/24 at  9:00 AM EST by a video enabled telemedicine application and verified that I am speaking with the correct person using two identifiers.  Location: Patient: home Provider: office   I discussed the limitations of evaluation and management by telemedicine and the availability of in person appointments. The patient expressed understanding and agreed to proceed.      I discussed the assessment and treatment plan with the patient. The patient was provided an opportunity to ask questions and all were answered. The patient agreed with the plan and demonstrated an understanding of the instructions.   The patient was advised to call back or seek an in-person evaluation if the symptoms worsen or if the condition fails to improve as anticipated.  I provided 20 minutes of non-face-to-face time during this encounter.   Rachel Ruder, MD  [facility] MD/PA/NP OP Progress Note  01/15/2024 9:15 AM Rachel Sutton  MRN:  [id number]  Chief Complaint:  Chief Complaint  Patient presents with   Depression   Anxiety   Follow-up   HPI: This patient is a 63 year old white female who is living in [place] with her husband.  She works as an Airline pilot for [employer].  The patient returns for follow-up after 3 months regarding her depression and anxiety.  She states overall she is doing well.  Her marriage is not very satisfactory because of lack of intimacy.  She states her husband has ED but will not go get treatment for it.  However they get along okay.  She states that her mood is stable and she denies significant depression thoughts of self-harm or suicidal ideation.  Her anxiety is under good control with the Xanax that she sleeps well with that.  Her PCP apparently is concerned about it but her dosages relatively low and there are also health effects of having anxiety as well as insomnia.  For now she would like to  stick with that. Visit Diagnosis:    ICD-10-CM   1. Recurrent major depressive disorder, in partial remission (HCC)  F33.41       Past Psychiatric History: Long-term outpatient treatment  Past Medical History:  Past Medical History:  Diagnosis Date   Anxiety    Arthritis    Breast cancer (HCC) 02/22/2012   Right T1c, N0   C. difficile colitis    Colitis, collagenous 09/22/2015   Depression    Essential hypertension    Family history of brain cancer    Family history of esophageal cancer    Family history of lung cancer    Family history of lymphoma    Family history of pancreatic cancer    Fuchs' corneal dystrophy    GERD (gastroesophageal reflux disease)    Hypertension    Mitral valve prolapse    Mild mitral regurgitation   Mixed hyperlipidemia    OSA (obstructive sleep apnea)    Peripheral neuropathy    Seizures (HCC)    Only with dose of haldol; no more since that was stopped.    Past Surgical History:  Procedure Laterality Date   ABDOMINAL HYSTERECTOMY  1999   BACK SURGERY     BIOPSY N/A 09/28/2015   Procedure: BIOPSY;  Surgeon: West Bali, MD;  Location: [facility];  Service: Endoscopy;  Laterality: N/A;   BIOPSY  04/05/2016   Procedure: BIOPSY;  Surgeon: West Bali, MD;  Location: [facility];  Service: Endoscopy;;  colon bx   BREAST LUMPECTOMY  10/30/2002   right   BREAST RECONSTRUCTION WITH PLACEMENT OF TISSUE EXPANDER AND FLEX HD (ACELLULAR HYDRATED DERMIS) Right 09/07/2019   Procedure: RIGHT BREAST RECONSTRUCTION WITH PLACEMENT OF TISSUE EXPANDER AND FLEX HD (ACELLULAR HYDRATED DERMIS);  Surgeon: Peggye Form, DO;  Location: [facility];  Service: Plastics;  Laterality: Right;   CARDIAC CATHETERIZATION     Carpal tunnel R/L  1992   Cervical spine fusion X2 since last visit     COLONOSCOPY WITH PROPOFOL N/A 09/28/2015   Procedure: COLONOSCOPY WITH PROPOFOL;  Surgeon: West Bali, MD;  Location: [facility];  Service: Endoscopy;  Laterality: N/A;   procedure 1 cecum time in 1232  time out 1246   total time 14 mintes   COLONOSCOPY WITH PROPOFOL N/A 04/05/2016   Procedure: COLONOSCOPY WITH PROPOFOL;  Surgeon: West Bali, MD;  [facility]: [facility];  Service: Endoscopy;  Laterality: N/A;  1200   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   DEBRIDEMENT AND CLOSURE WOUND Right 02/17/2020   Procedure: Excision of right breast wound with closure and provena placement;  Surgeon: Peggye Form, DO;  Location: [facility];  Service: Plastics;  Laterality: Right;   ELBOW SURGERY Right  tennis elbow release 2012   ESOPHAGOGASTRODUODENOSCOPY  10/26/2012   Procedure: ESOPHAGOGASTRODUODENOSCOPY (EGD);  Surgeon: Louis Meckel, MD;  Location: [facility];  Service: Endoscopy;  Laterality: N/A;   ESOPHAGOGASTRODUODENOSCOPY (EGD) WITH PROPOFOL N/A 09/28/2015   Procedure: ESOPHAGOGASTRODUODENOSCOPY (EGD) WITH PROPOFOL;  Surgeon: West Bali, MD;  Location: [facility];  Service: Endoscopy;  Laterality: N/A;   EYE SURGERY  2018   corneal transplants bilateral   KNEE ARTHROSCOPY Left    LEFT HEART CATHETERIZATION WITH CORONARY ANGIOGRAM N/A 10/23/2012   Procedure: LEFT HEART CATHETERIZATION WITH CORONARY ANGIOGRAM;  Surgeon: Peter M Swaziland, MD;  Location: [facility];  Service: Cardiovascular;  Laterality: N/A;   MASTECTOMY Right    MASTECTOMY W/ SENTINEL NODE BIOPSY Right 09/07/2019   Procedure: RIGHT MASTECTOMY WITH SENTINEL LYMPH NODE BIOPSY;  Surgeon: Griselda Miner, MD;  Location: [facility];  Service: General;  Laterality: Right;   OVARIAN CYST REMOVAL     prior to hysterectomy, right   Ray cage fusion  1997   REMOVAL OF TISSUE EXPANDER Right 12/31/2019   Procedure: REMOVAL OF TISSUE EXPANDER AND EXCESS SKIN;  Surgeon: Peggye Form, DO;  Location: [facility];  Service: Plastics;  Laterality: Right;  1 hour   SENTINEL LYMPH NODE BIOPSY  10/30/2002   right   TONSILLECTOMY     at six months old    Family  Psychiatric History: See below  Family History:  Family History  Problem Relation Age of Onset   Diabetes Mother    Pancreatic cancer Mother 66   Lung cancer Father    Diabetes Father    Heart disease Father    Esophageal cancer Father 24       Beer drinker   Schizophrenia Sister    Cancer Maternal Uncle        unknown form of cancer; cigar smoker   Lung cancer Maternal Grandmother    Brain cancer Maternal Grandfather    Heart disease Paternal Grandfather    Brain cancer Cousin        dx in his late 33s-40s   Brain cancer Brother 44   Lymphoma Niece 4   Anesthesia problems Neg Hx    Hypotension Neg  Hx    Malignant hyperthermia Neg Hx    Pseudochol deficiency Neg Hx    Colon cancer Neg Hx    Colon polyps Neg Hx    Crohn's disease Neg Hx    Ulcerative colitis Neg Hx    Breast cancer Neg Hx     Social History:  Social History   Socioeconomic History   Marital status: Married    Spouse name: Not on file   Number of children: 2   Years of education: Not on file   Highest education level: Not on file  Occupational History   Occupation: Agricultural engineer: [employer]    Comment: [employer]  Tobacco Use   Smoking status: Every Day    Current packs/day: 0.00    Average packs/day: 0.3 packs/day for 45.0 years (13.5 ttl pk-yrs)    Types: Cigarettes    Start date: 07/07/1974    Last attempt to quit: 07/14/2019    Years since quitting: 4.5   Smokeless tobacco: Never  Vaping Use   Vaping status: Never Used  Substance and Sexual Activity   Alcohol use: Not Currently    Alcohol/week: 0.0 standard drinks of alcohol    Comment: stopped July 26, 2014)   Drug use: No   Sexual activity: Not Currently    Birth control/protection: Surgical  Other Topics Concern   Not on file  Social History Narrative   Not on file   Social Drivers of Health   Financial Resource Strain: Not on file  Food Insecurity: Not on file  Transportation Needs: Not on file  Physical Activity: Not  on file  Stress: Not on file  Social Connections: Not on file    Allergies:  Allergies  Allergen Reactions   Bee Venom Anaphylaxis   Haloperidol Lactate Other (See Comments)    Convulsions    Meperidine Hcl Hives   Xifaxan [Rifaximin] Diarrhea and Other (See Comments)    Constantly felt the need to use the bathroom     Metabolic Disorder Labs: No results found for: "HGBA1C", "MPG" No results found for: "PROLACTIN" Lab Results  Component Value Date   CHOL 207 (H) 10/23/2012   TRIG 319 (H) 10/23/2012   HDL 33 (L) 10/23/2012   CHOLHDL 6.3 10/23/2012   VLDL 64 (H) 10/23/2012   LDLCALC 110 (H) 10/23/2012   Lab Results  Component Value Date   TSH 0.706 09/22/2015    Therapeutic Level Labs: No results found for: "LITHIUM" No results found for: "VALPROATE" No results found for: "CBMZ"  Current Medications: Current Outpatient Medications  Medication Sig Dispense Refill   acetaminophen (TYLENOL) 500 MG tablet Take 1,000 mg by mouth every 6 (six) hours as needed for moderate pain or headache.     ALPRAZolam (XANAX) 0.5 MG tablet 1 tablet by mouth in the morning and 2 tablets by mouth at bed time 90 tablet 3   Calcium-Magnesium-Vitamin D (CALCIUM 500 PO) Take 1 tablet by mouth daily.      Dexlansoprazole 30 MG capsule DR Take 1 capsule by mouth daily.     diclofenac sodium (VOLTAREN) 1 % GEL Apply 1 application topically 3 (three) times daily as needed (pain).      EPINEPHrine (EPI-PEN) 0.3 mg/0.3 mL DEVI Inject 0.3 mg into the muscle as needed (bee stings).      fish oil-omega-3 fatty acids 1000 MG capsule Take 1 g by mouth every morning.      gabapentin (NEURONTIN) 300 MG capsule Take 300 mg by mouth  3 (three) times daily.     hydrochlorothiazide (MICROZIDE) 12.5 MG capsule TAKE 1 CAPSULE DAILY 90 capsule 3   hydroxychloroquine (PLAQUENIL) 200 MG tablet Take 400 mg by mouth daily.     leflunomide (ARAVA) 20 MG tablet Take 20 mg by mouth daily.     metFORMIN (GLUCOPHAGE) 500  MG tablet Take 500 mg by mouth 2 (two) times daily.     metoprolol succinate (TOPROL-XL) 50 MG 24 hr tablet TAKE 1 TABLET TWICE A DAY  WITH OR IMMEDIATELY        FOLLOWING A MEAL 180 tablet 2   Multiple Vitamin (MULITIVITAMIN WITH MINERALS) TABS Take 1 tablet by mouth every morning.     NIFEdipine (PROCARDIA XL/NIFEDICAL-XL) 90 MG 24 hr tablet TAKE 1 TABLET DAILY 90 tablet 3   nitroGLYCERIN (NITROSTAT) 0.4 MG SL tablet Place 1 tablet (0.4 mg total) under the tongue every 5 (five) minutes x 3 doses as needed for chest pain. 25 tablet 3   pantoprazole (PROTONIX) 40 MG tablet TAKE 1 TABLET DAILY 90 tablet 3   pravastatin (PRAVACHOL) 20 MG tablet Take 20 mg by mouth daily.     predniSONE (DELTASONE) 10 MG tablet Take 10 mg by mouth. As needed for gout flare up     sertraline (ZOLOFT) 25 MG tablet Take 1 tablet (25 mg total) by mouth daily. 90 tablet 2   sulfaSALAzine (AZULFIDINE) 500 MG tablet Take 500 mg by mouth daily.     tamoxifen (NOLVADEX) 20 MG tablet TAKE 1 TABLET DAILY 90 tablet 1   Current Facility-Administered Medications  Medication Dose Route Frequency Provider Last Rate Last Admin   betamethasone acetate-betamethasone sodium phosphate (CELESTONE) injection 3 mg  3 mg Intramuscular Once Felecia Shelling, DPM         Musculoskeletal: Strength & Muscle Tone: within normal limits Gait & Station: normal Patient leans: N/A  Psychiatric Specialty Exam: Review of Systems  Musculoskeletal:  Positive for arthralgias and joint swelling.  All other systems reviewed and are negative.   There were no vitals taken for this visit.There is no height or weight on file to calculate BMI.  General Appearance: Casual and Fairly Groomed  Eye Contact:  Good  Speech:  Clear and Coherent  Volume:  Normal  Mood:  Euthymic  Affect:  Congruent  Thought Process:  Goal Directed  Orientation:  Full (Time, Place, and Person)  Thought Content: WDL   Suicidal Thoughts:  No  Homicidal Thoughts:  No   Memory:  Immediate;   Good Recent;   Good Remote;   NA  Judgement:  Good  Insight:  Good  Psychomotor Activity:  Normal  Concentration:  Concentration: Good and Attention Span: Good  Recall:  Good  Fund of Knowledge: Good  Language: Good  Akathisia:  No  Handed:  Right  AIMS (if indicated): not done  Assets:  Communication Skills Desire for Improvement Resilience Social Support Talents/Skills Vocational/Educational  ADL's:  Intact  Cognition: WNL  Sleep:  Good   Screenings: GAD-7    Flowsheet Row Office Visit from 07/11/2022 in [facility] [place]  Total GAD-7 Score 3      PHQ2-9    Flowsheet Row Office Visit from 07/11/2022 in [facility] [place] Video Visit from 06/07/2022 in [facility] [place] Video Visit from 12/07/2021 in [facility] at [place] Video Visit from 09/07/2021 in [facility] at [place] Video Visit  from 08/10/2021 in [facility] Total Score 1 0 0 0 1  PHQ-9 Total Score 7 -- -- -- --      Flowsheet Row Office Visit from 07/11/2022 in [facility] [place] Video Visit from 06/07/2022 in [facility] [place] Video Visit from 12/07/2021 in [facility] at [place]  C-SSRS RISK CATEGORY No Risk No Risk No Risk        Assessment and Plan: This patient is a 63 year old female with a history of depression and anxiety.  She continues to do well on her current regimen.  She will continue Zoloft 25 mg daily for depression and anxiety and Xanax 0.5 mg in the morning and 1 mg at bedtime for anxiety and sleep.  She will return to see me in 3 months  Collaboration of Care: Collaboration of Care: Primary Care Provider AEB notes will be shared with PCP at  patient's request  Patient/Guardian was advised Release of Information must be obtained prior to any record release in order to collaborate their care with an outside provider. Patient/Guardian was advised if they have not already done so to contact the registration department to sign all necessary forms in order for [place] to release information regarding their care.   Consent: Patient/Guardian gives verbal consent for treatment and assignment of benefits for services provided during this visit. Patient/Guardian expressed understanding and agreed to proceed.    Rachel Ruder, MD 01/15/2024, 9:15 AM

## 2024-01-28 ENCOUNTER — Ambulatory Visit: Payer: Commercial Managed Care - PPO | Admitting: Internal Medicine

## 2024-01-28 NOTE — Progress Notes (Unsigned)
[facility] [address] [place], [place] [address]  Pulmonary Sleep Medicine   Office Visit Note  Patient Name: Rachel Sutton DOB: 03/13/1961 MRN [id number]    Chief Complaint: Obstructive Sleep Apnea visit  Brief History:  Rachel Sutton is seen today for *** The patient has a *** history of sleep apnea. Patient *** using PAP nightly.  The patient feels *** after sleeping with PAP.  The patient reports *** from PAP use. Reported sleepiness is  *** and the Epworth Sleepiness Score is *** out of 24. The patient *** take naps. The patient complains of the following: ***  The compliance download shows  compliance with an average use time of *** hours. The AHI is ***  The patient *** of limb movements disrupting sleep.  ROS  General: (-) fever, (-) chills, (-) night sweat Nose and Sinuses: (-) nasal stuffiness or itchiness, (-) postnasal drip, (-) nosebleeds, (-) sinus trouble. Mouth and Throat: (-) sore throat, (-) hoarseness. Neck: (-) swollen glands, (-) enlarged thyroid, (-) neck pain. Respiratory: *** cough, *** shortness of breath, *** wheezing. Neurologic: *** numbness, *** tingling. Psychiatric: *** anxiety, *** depression   Current Medication: Outpatient Encounter Medications as of 01/28/2024  Medication Sig   acetaminophen (TYLENOL) 500 MG tablet Take 1,000 mg by mouth every 6 (six) hours as needed for moderate pain or headache.   ALPRAZolam (XANAX) 0.5 MG tablet 1 tablet by mouth in the morning and 2 tablets by mouth at bed time   Calcium-Magnesium-Vitamin D (CALCIUM 500 PO) Take 1 tablet by mouth daily.    Dexlansoprazole 30 MG capsule DR Take 1 capsule by mouth daily.   diclofenac sodium (VOLTAREN) 1 % GEL Apply 1 application topically 3 (three) times daily as needed (pain).    EPINEPHrine (EPI-PEN) 0.3 mg/0.3 mL DEVI Inject 0.3 mg into the muscle as needed (bee stings).    fish oil-omega-3 fatty acids 1000 MG capsule Take 1 g by mouth every morning.     gabapentin (NEURONTIN) 300 MG capsule Take 300 mg by mouth 3 (three) times daily.   hydrochlorothiazide (MICROZIDE) 12.5 MG capsule TAKE 1 CAPSULE DAILY   hydroxychloroquine (PLAQUENIL) 200 MG tablet Take 400 mg by mouth daily.   leflunomide (ARAVA) 20 MG tablet Take 20 mg by mouth daily.   metFORMIN (GLUCOPHAGE) 500 MG tablet Take 500 mg by mouth 2 (two) times daily.   metoprolol succinate (TOPROL-XL) 50 MG 24 hr tablet TAKE 1 TABLET TWICE A DAY  WITH OR IMMEDIATELY        FOLLOWING A MEAL   Multiple Vitamin (MULITIVITAMIN WITH MINERALS) TABS Take 1 tablet by mouth every morning.   NIFEdipine (PROCARDIA XL/NIFEDICAL-XL) 90 MG 24 hr tablet TAKE 1 TABLET DAILY   nitroGLYCERIN (NITROSTAT) 0.4 MG SL tablet Place 1 tablet (0.4 mg total) under the tongue every 5 (five) minutes x 3 doses as needed for chest pain.   pantoprazole (PROTONIX) 40 MG tablet TAKE 1 TABLET DAILY   pravastatin (PRAVACHOL) 20 MG tablet Take 20 mg by mouth daily.   predniSONE (DELTASONE) 10 MG tablet Take 10 mg by mouth. As needed for gout flare up   sertraline (ZOLOFT) 25 MG tablet Take 1 tablet (25 mg total) by mouth daily.   sulfaSALAzine (AZULFIDINE) 500 MG tablet Take 500 mg by mouth daily.   tamoxifen (NOLVADEX) 20 MG tablet TAKE 1 TABLET DAILY   Facility-Administered Encounter Medications as of 01/28/2024  Medication   betamethasone acetate-betamethasone sodium phosphate (CELESTONE) injection 3 mg  Surgical History: Past Surgical History:  Procedure Laterality Date   ABDOMINAL HYSTERECTOMY  1999   BACK SURGERY     BIOPSY N/A 09/28/2015   Procedure: BIOPSY;  Surgeon: West Bali, MD;  Location: [facility];  Service: Endoscopy;  Laterality: N/A;   BIOPSY  04/05/2016   Procedure: BIOPSY;  Surgeon: West Bali, MD;  Location: [facility];  Service: Endoscopy;;  colon bx   BREAST LUMPECTOMY  10/30/2002   right   BREAST RECONSTRUCTION WITH PLACEMENT OF TISSUE EXPANDER AND FLEX HD (ACELLULAR HYDRATED DERMIS) Right  09/07/2019   Procedure: RIGHT BREAST RECONSTRUCTION WITH PLACEMENT OF TISSUE EXPANDER AND FLEX HD (ACELLULAR HYDRATED DERMIS);  Surgeon: Peggye Form, DO;  Location: [facility];  Service: Plastics;  Laterality: Right;   CARDIAC CATHETERIZATION     Carpal tunnel R/L  1992   Cervical spine fusion X2 since last visit     COLONOSCOPY WITH PROPOFOL N/A 09/28/2015   Procedure: COLONOSCOPY WITH PROPOFOL;  Surgeon: West Bali, MD;  Location: [facility];  Service: Endoscopy;  Laterality: N/A;  procedure 1 cecum time in 1232  time out 1246   total time 14 mintes   COLONOSCOPY WITH PROPOFOL N/A 04/05/2016   Procedure: COLONOSCOPY WITH PROPOFOL;  Surgeon: West Bali, MD;  Location: [facility];  Service: Endoscopy;  Laterality: N/A;  1200   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   DEBRIDEMENT AND CLOSURE WOUND Right 02/17/2020   Procedure: Excision of right breast wound with closure and provena placement;  Surgeon: Peggye Form, DO;  Location: [facility];  Service: Plastics;  Laterality: Right;   ELBOW SURGERY Right  tennis elbow release 2012   ESOPHAGOGASTRODUODENOSCOPY  10/26/2012   Procedure: ESOPHAGOGASTRODUODENOSCOPY (EGD);  Surgeon: Louis Meckel, MD;  Location: [facility];  Service: Endoscopy;  Laterality: N/A;   ESOPHAGOGASTRODUODENOSCOPY (EGD) WITH PROPOFOL N/A 09/28/2015   Procedure: ESOPHAGOGASTRODUODENOSCOPY (EGD) WITH PROPOFOL;  Surgeon: West Bali, MD;  Location: [facility];  Service: Endoscopy;  Laterality: N/A;   EYE SURGERY  2018   corneal transplants bilateral   KNEE ARTHROSCOPY Left    LEFT HEART CATHETERIZATION WITH CORONARY ANGIOGRAM N/A 10/23/2012   Procedure: LEFT HEART CATHETERIZATION WITH CORONARY ANGIOGRAM;  Surgeon: Peter M Swaziland, MD;  Location: [facility] C[facility] LAB;  Service: Cardiovascular;  Laterality: N/A;   MASTECTOMY Right    MASTECTOMY W/ SENTINEL NODE BIOPSY Right 09/07/2019   Procedure: RIGHT MASTECTOMY WITH SENTINEL LYMPH NODE BIOPSY;  Surgeon:  Griselda Miner, MD;  Location: [facility];  Service: General;  Laterality: Right;   OVARIAN CYST REMOVAL     prior to hysterectomy, right   Ray cage fusion  1997   REMOVAL OF TISSUE EXPANDER Right 12/31/2019   Procedure: REMOVAL OF TISSUE EXPANDER AND EXCESS SKIN;  Surgeon: Peggye Form, DO;  Location: [facility];  Service: Plastics;  Laterality: Right;  1 hour   SENTINEL LYMPH NODE BIOPSY  10/30/2002   right   TONSILLECTOMY     at six months old    Medical History: Past Medical History:  Diagnosis Date   Anxiety    Arthritis    Breast cancer (HCC) 02/22/2012   Right T1c, N0   C. difficile colitis    Colitis, collagenous 09/22/2015   Depression    Essential hypertension    Family history of brain cancer    Family history of esophageal cancer    Family history of lung cancer  Family history of lymphoma    Family history of pancreatic cancer    Fuchs' corneal dystrophy    GERD (gastroesophageal reflux disease)    Hypertension    Mitral valve prolapse    Mild mitral regurgitation   Mixed hyperlipidemia    OSA (obstructive sleep apnea)    Peripheral neuropathy    Seizures (HCC)    Only with dose of haldol; no more since that was stopped.    Family History: Non contributory to the present illness  Social History: Social History   Socioeconomic History   Marital status: Married    Spouse name: Not on file   Number of children: 2   Years of education: Not on file   Highest education level: Not on file  Occupational History   Occupation: Agricultural engineer: [employer]    Comment: [employer]  Tobacco Use   Smoking status: Every Day    Current packs/day: 0.00    Average packs/day: 0.3 packs/day for 45.0 years (13.5 ttl pk-yrs)    Types: Cigarettes    Start date: 07/07/1974    Last attempt to quit: 07/14/2019    Years since quitting: 4.5   Smokeless tobacco: Never  Vaping Use   Vaping status: Never Used  Substance and Sexual Activity   Alcohol  use: Not Currently    Alcohol/week: 0.0 standard drinks of alcohol    Comment: stopped July 26, 2014)   Drug use: No   Sexual activity: Not Currently    Birth control/protection: Surgical  Other Topics Concern   Not on file  Social History Narrative   Not on file   Social Drivers of Health   Financial Resource Strain: Not on file  Food Insecurity: Not on file  Transportation Needs: Not on file  Physical Activity: Not on file  Stress: Not on file  Social Connections: Not on file  Intimate Partner Violence: Not on file    Vital Signs: There were no vitals taken for this visit. There is no height or weight on file to calculate BMI.    Examination: General Appearance: The patient is well-developed, well-nourished, and in no distress. Neck Circumference: *** Skin: Gross inspection of skin unremarkable. Head: normocephalic, no gross deformities. Eyes: no gross deformities noted. ENT: ears appear grossly normal Neurologic: Alert and oriented. No involuntary movements.  STOP BANG RISK ASSESSMENT S (snore) Have you been told that you snore?     YES/N   T (tired) Are you often tired, fatigued, or sleepy during the day?   YES/NO  O (obstruction) Do you stop breathing, choke, or gasp during sleep? YES/NO   P (pressure) Do you have or are you being treated for high blood pressure? YES/NO   B (BMI) Is your body index greater than 35 kg/m? YES/NO   A (age) Are you 29 years old or older? YES/NO   N (neck) Do you have a neck circumference greater than 16 inches?   YES/NO   G (gender) Are you a female? YES/NO   TOTAL STOP/BANG "YES" ANSWERS        A STOP-Bang score of 2 or less is considered low risk, and a score of 5 or more is high risk for having either moderate or severe OSA. For people who score 3 or 4, doctors may need to perform further assessment to determine how likely they are to have OSA.         EPWORTH SLEEPINESS SCALE:  Scale:  (0)= no chance of dozing; (  1)=  slight chance of dozing; (2)= moderate chance of dozing; (3)= high chance of dozing  Chance  Situtation    Sitting and reading: ***    Watching TV: ***    Sitting Inactive in public: ***    As a passenger in car: ***      Lying down to rest: ***    Sitting and talking: ***    Sitting quielty after lunch: ***    In a car, stopped in traffic: ***   TOTAL SCORE:   *** out of 24    SLEEP STUDIES:  ***   CPAP COMPLIANCE DATA:  Date Range: ***  Average Daily Use: *** hours  Median Use: ***  Compliance for > 4 Hours: *** days  AHI: *** respiratory events per hour  Days Used: ***  Mask Leak: ***  95th Percentile Pressure: ***         LABS: Recent Results (from the past 2160 hours)  CMP (Cancer Center only)     Status: Abnormal   Collection Time: 11/19/23 10:23 AM  Result Value Ref Range   Sodium 140 135 - 145 mmol/L   Potassium 3.7 3.5 - 5.1 mmol/L   Chloride 106 98 - 111 mmol/L   CO2 25 22 - 32 mmol/L   Glucose, Bld 156 (H) 70 - 99 mg/dL    Comment: Glucose reference range applies only to samples taken after fasting for at least 8 hours.   BUN 14 8 - 23 mg/dL   Creatinine 3.22 0.25 - 1.00 mg/dL   Calcium 9.1 8.9 - 42.7 mg/dL   Total Protein 6.7 6.5 - 8.1 g/dL   Albumin 4.0 3.5 - 5.0 g/dL   AST 22 15 - 41 U/L   ALT 16 0 - 44 U/L   Alkaline Phosphatase 58 38 - 126 U/L   Total Bilirubin 0.4 <1.2 mg/dL   GFR, Estimated >06 >23 mL/min    Comment: (NOTE) Calculated using the CKD-EPI Creatinine Equation (2021)    Anion gap 9 5 - 15    Comment: Performed at [facility], [address], [place], [place] [address]  CBC with Differential (Cancer Center Only)     Status: None   Collection Time: 11/19/23 10:23 AM  Result Value Ref Range   WBC Count 7.4 4.0 - 10.5 K/uL   RBC 4.17 3.87 - 5.11 MIL/uL   Hemoglobin 12.6 12.0 - 15.0 g/dL   HCT 15.1 76.1 - 60.7 %   MCV 89.0 80.0 - 100.0 fL   MCH 30.2 26.0 - 34.0 pg   MCHC 34.0  30.0 - 36.0 g/dL   RDW 37.1 06.2 - 69.4 %   Platelet Count 274 150 - 400 K/uL   nRBC 0.0 0.0 - 0.2 %   Neutrophils Relative % 44 %   Neutro Abs 3.2 1.7 - 7.7 K/uL   Lymphocytes Relative 41 %   Lymphs Abs 3.1 0.7 - 4.0 K/uL   Monocytes Relative 9 %   Monocytes Absolute 0.7 0.1 - 1.0 K/uL   Eosinophils Relative 4 %   Eosinophils Absolute 0.3 0.0 - 0.5 K/uL   Basophils Relative 1 %   Basophils Absolute 0.1 0.0 - 0.1 K/uL   Immature Granulocytes 1 %   Abs Immature Granulocytes 0.05 0.00 - 0.07 K/uL    Comment: Performed at [facility], [address] [place], [place] [address]    Radiology: [place] AXILLA RIGHT Result Date: 06/12/2023 : CLINICAL DATA: 63 year old  with a personal history of malignant RIGHT mastectomy in 2020 for recurrent cancer, presenting with a palpable lump in the upper RIGHT axilla. EXAM: ULTRASOUND OF THE RIGHT AXILLA COMPARISON: 07/01/2019. FINDINGS: On correlative physical exam, there is a somewhat fluctuant palpable superficial approximate 1 cm lump in the upper axilla corresponding to what the patient is feeling. Targeted ultrasound is performed, showing a prominent subcutaneous fat lobule in the upper axilla in the area of palpable concern. No suspicious solid mass or pathologic lymphadenopathy is identified. IMPRESSION: No mass or pathologic lymphadenopathy involving the RIGHT axilla. Prominent subcutaneous fat lobule accounts for the palpable concern in the upper axilla. RECOMMENDATION: Annual screening LEFT mammogram which is due in March, 2025. I have discussed the findings and recommendations with the patient. If applicable, a reminder letter will be sent to the patient regarding the next appointment. BI-RADS CATEGORY: 2: Benign. Electronically Signed   By: Hulan Saas M.D.   On: 06/12/2023 08:20   No results found.  No results found.    Assessment and Plan: Patient Active Problem List   Diagnosis Date Noted   Osteoporosis  12/18/2022   Anxiety 12/21/2020   Body mass index (BMI) 31.0-31.9, adult 12/21/2020   Fatty liver 12/21/2020   History of radiation therapy 12/21/2020   Personal history of malignant neoplasm of breast 12/21/2020   Pure hypercholesterolemia 12/21/2020   Rosacea 12/21/2020   Seronegative rheumatoid arthritis (HCC) 12/21/2020   Cough 06/22/2020   Breast wound, right, initial encounter 02/09/2020   Acquired absence of breast 12/22/2019   Genetic testing 07/20/2019   Family history of brain cancer    Family history of pancreatic cancer    Family history of lymphoma    Family history of esophageal cancer    Family history of lung cancer    Malignant neoplasm of upper-outer quadrant of right breast in female, estrogen receptor positive (HCC) 07/06/2019   Fuchs' corneal dystrophy 12/03/2016   Nuclear sclerotic cataract of both eyes 12/03/2016   GERD (gastroesophageal reflux disease) 05/30/2016   Clostridium difficile colitis 01/25/2016   Colitis, collagenous 09/22/2015   Depression 08/17/2014   Palpitations 04/20/2013   Hyperlipidemia 10/26/2012   Essential hypertension 10/26/2012   Atypical chest pain 10/23/2012   Mitral valve prolapse, nonrheumatic 10/23/2012   OA (osteoarthritis) of knee 08/07/2011      The patient *** tolerate PAP and reports *** benefit from PAP use. The patient was reminded how to *** and advised to ***. The patient was also counselled on ***. The compliance is ***. The AHI is ***.   ***  General Counseling: I have discussed the findings of the evaluation and examination with Rachel Sutton.  I have also discussed any further diagnostic evaluation thatmay be needed or ordered today. Rachel Sutton verbalizes understanding of the findings of todays visit. We also reviewed her medications today and discussed drug interactions and side effects including but not limited excessive drowsiness and altered mental states. We also discussed that there is always a risk not just to her  but also people around her. she has been encouraged to call the office with any questions or concerns that should arise related to todays visit.  No orders of the defined types were placed in this encounter.       I have personally obtained a history, examined the patient, evaluated laboratory and imaging results, formulated the assessment and plan and placed orders.  Yevonne Pax, MD [facility] Diplomate ABMS Pulmonary Critical Care Medicine and Sleep Medicine

## 2024-02-25 ENCOUNTER — Ambulatory Visit: Payer: Commercial Managed Care - PPO

## 2024-03-04 ENCOUNTER — Ambulatory Visit
Admission: RE | Admit: 2024-03-04 | Discharge: 2024-03-04 | Disposition: A | Discharge status: Home / Self Care | Source: Ambulatory Visit | Attending: Hematology

## 2024-03-04 DIAGNOSIS — Z1231 Encounter for screening mammogram for malignant neoplasm of breast: Secondary | ICD-10-CM

## 2024-03-09 ENCOUNTER — Telehealth: Payer: Self-pay | Admitting: Cardiology

## 2024-03-09 MED ORDER — NIFEDIPINE ER OSMOTIC RELEASE 90 MG PO TB24: 90.0000 mg | ORAL_TABLET | Freq: Every day | ORAL | 0 refills | Status: DC

## 2024-03-09 MED ORDER — PANTOPRAZOLE SODIUM 40 MG PO TBEC: 40.0000 mg | DELAYED_RELEASE_TABLET | Freq: Every day | ORAL | 0 refills | Status: DC

## 2024-03-09 MED ORDER — HYDROCHLOROTHIAZIDE 12.5 MG PO CAPS: 12.5000 mg | ORAL_CAPSULE | Freq: Every day | ORAL | 0 refills | Status: DC

## 2024-03-09 NOTE — Telephone Encounter (Signed)
*  STAT* If patient is at the pharmacy, call can be transferred to refill team.   1. Which medications need to be refilled? (please list name of each medication and dose if known)  new prescriptions for  Nifedipine,Hydrochlorothiazide, and Pantoprazole  2. Would you like to learn more about the convenience, safety, & potential cost savings by using the [facility]?    3. Are you open to using the [facility] (Type [facility]   4. Which pharmacy/location (including street and city if local pharmacy) is medication to be sent to?[facility]   5. Do they need a 30 day or 90 day supply? 90 days and refills

## 2024-03-09 NOTE — Telephone Encounter (Signed)
 Medication refill request completed. Pt needs f/u appt for further refills.

## 2024-03-18 ENCOUNTER — Telehealth (HOSPITAL_COMMUNITY): Admitting: Psychiatry

## 2024-03-18 ENCOUNTER — Encounter (HOSPITAL_COMMUNITY): Payer: Self-pay | Admitting: Psychiatry

## 2024-03-18 DIAGNOSIS — F3341 Major depressive disorder, recurrent, in partial remission: Secondary | ICD-10-CM | POA: Diagnosis not present

## 2024-03-18 MED ORDER — SERTRALINE HCL 50 MG PO TABS: 50.0000 mg | ORAL_TABLET | Freq: Every day | ORAL | 2 refills | Status: DC

## 2024-03-18 MED ORDER — ALPRAZOLAM 1 MG PO TABS: 1.0000 mg | ORAL_TABLET | Freq: Two times a day (BID) | ORAL | 2 refills | Status: DC

## 2024-03-18 NOTE — Progress Notes (Signed)
 Virtual Visit via Video Note  I connected with Rachel Sutton on 03/18/24 at  1:00 PM EDT by a video enabled telemedicine application and verified that I am speaking with the correct person using two identifiers.  Location: Patient: home Provider: office   I discussed the limitations of evaluation and management by telemedicine and the availability of in person appointments. The patient expressed understanding and agreed to proceed.    I discussed the assessment and treatment plan with the patient. The patient was provided an opportunity to ask questions and all were answered. The patient agreed with the plan and demonstrated an understanding of the instructions.   The patient was advised to call back or seek an in-person evaluation if the symptoms worsen or if the condition fails to improve as anticipated.  I provided 20 minutes of non-face-to-face time during this encounter.   [name], MD  [facility] MD/PA/NP OP Progress Note  03/18/2024 1:20 PM Rachel Sutton  MRN:  [id number]  Chief Complaint:  Chief Complaint  Patient presents with   Anxiety   Depression   Follow-up   HPI: This patient is a 63 year old white female who lives in [place] with her husband.  She works as an Airline pilot for [employer].  The patient returns for follow-up after 3 months regarding her depression and anxiety.  She states that she called in to come back to see me sooner because lately she has been very anxious.  She is nervous worried and wakes up several times a night for unknown reasons.  She states that she recently went through a lawsuit.  Her stepdaughter was doing her over her ex-husband's property that was left to her after he died.  The judge did not miss the case but obviously this was stressful.  Her work is more stressful because she has to drive into work and it is a long way.  She is also having flareups of her rheumatoid arthritis despite being on several medications plus an  infusion.  Lately her blood pressure has been running high and she thinks it is due to anxiety.  I encouraged her to do some relaxation exercises and look up gentle yoga moves and breathing techniques.  We can also increase the Xanax as well as the Zoloft.  She denies significant depression or thoughts of self-harm or suicide Visit Diagnosis:    ICD-10-CM   1. Recurrent major depressive disorder, in partial remission (HCC)  F33.41       Past Psychiatric History: Long-term outpatient treatment  Past Medical History:  Past Medical History:  Diagnosis Date   Anxiety    Arthritis    Breast cancer (HCC) 02/22/2012   Right T1c, N0   C. difficile colitis    Colitis, collagenous 09/22/2015   Depression    Essential hypertension    Family history of brain cancer    Family history of esophageal cancer    Family history of lung cancer    Family history of lymphoma    Family history of pancreatic cancer    Fuchs' corneal dystrophy    GERD (gastroesophageal reflux disease)    Hypertension    Mitral valve prolapse    Mild mitral regurgitation   Mixed hyperlipidemia    OSA (obstructive sleep apnea)    Peripheral neuropathy    Seizures (HCC)    Only with dose of haldol; no more since that was stopped.    Past Surgical History:  Procedure Laterality Date   ABDOMINAL HYSTERECTOMY  1999   BACK SURGERY     BIOPSY N/A 09/28/2015   Procedure: BIOPSY;  Surgeon: West Bali, MD;  Location: [facility];  Service: Endoscopy;  Laterality: N/A;   BIOPSY  04/05/2016   Procedure: BIOPSY;  Surgeon: West Bali, MD;  [facility]: [facility];  Service: Endoscopy;;  colon bx   BREAST LUMPECTOMY  10/30/2002   right   BREAST RECONSTRUCTION WITH PLACEMENT OF TISSUE EXPANDER AND FLEX HD (ACELLULAR HYDRATED DERMIS) Right 09/07/2019   Procedure: RIGHT BREAST RECONSTRUCTION WITH PLACEMENT OF TISSUE EXPANDER AND FLEX HD (ACELLULAR HYDRATED DERMIS);  Surgeon: Peggye Form, DO;  Location: [facility];   Service: Plastics;  Laterality: Right;   CARDIAC CATHETERIZATION     Carpal tunnel R/L  1992   Cervical spine fusion X2 since last visit     COLONOSCOPY WITH PROPOFOL N/A 09/28/2015   Procedure: COLONOSCOPY WITH PROPOFOL;  Surgeon: West Bali, MD;  Location: [facility];  Service: Endoscopy;  Laterality: N/A;  procedure 1 cecum time in 1232  time out 1246   total time 14 mintes   COLONOSCOPY WITH PROPOFOL N/A 04/05/2016   Procedure: COLONOSCOPY WITH PROPOFOL;  Surgeon: West Bali, MD;  [facility]: [facility];  Service: Endoscopy;  Laterality: N/A;  1200   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   DEBRIDEMENT AND CLOSURE WOUND Right 02/17/2020   Procedure: Excision of right breast wound with closure and provena placement;  Surgeon: Peggye Form, DO;  Location: [facility];  Service: Plastics;  Laterality: Right;   ELBOW SURGERY Right  tennis elbow release 2012   ESOPHAGOGASTRODUODENOSCOPY  10/26/2012   Procedure: ESOPHAGOGASTRODUODENOSCOPY (EGD);  Surgeon: Louis Meckel, MD;  Location[facility];  Service: Endoscopy;  Laterality: N/A;   ESOPHAGOGASTRODUODENOSCOPY (EGD) WITH PROPOFOL N/A 09/28/2015   Procedure: ESOPHAGOGASTRODUODENOSCOPY (EGD) WITH PROPOFOL;  Surgeon: West Bali, MD;  Location: [facility];  Service: Endoscopy;  Laterality: N/A;   EYE SURGERY  2018   corneal transplants bilateral   KNEE ARTHROSCOPY Left    LEFT HEART CATHETERIZATION WITH CORONARY ANGIOGRAM N/A 10/23/2012   Procedure: LEFT HEART CATHETERIZATION WITH CORONARY ANGIOGRAM;  Surgeon: Peter M Swaziland, MD;  Location: [facility] CATH LAB;  Service: Cardiovascular;  Laterality: N/A;   MASTECTOMY Right    MASTECTOMY W/ SENTINEL NODE BIOPSY Right 09/07/2019   Procedure: RIGHT MASTECTOMY WITH SENTINEL LYMPH NODE BIOPSY;  Surgeon: Griselda Miner, MD;  Location: [facility];  Service: General;  Laterality: Right;   OVARIAN CYST REMOVAL     prior to hysterectomy, right   Ray cage fusion  1997   REMOVAL OF TISSUE  EXPANDER Right 12/31/2019   Procedure: REMOVAL OF TISSUE EXPANDER AND EXCESS SKIN;  Surgeon: Peggye Form, DO;  Location: [facility];  Service: Plastics;  Laterality: Right;  1 hour   SENTINEL LYMPH NODE BIOPSY  10/30/2002   right   TONSILLECTOMY     at six months old    Family Psychiatric History: See below  Family History:  Family History  Problem Relation Age of Onset   Diabetes Mother    Pancreatic cancer Mother 26   Lung cancer Father    Diabetes Father    Heart disease Father    Esophageal cancer Father 26       Beer drinker   Schizophrenia Sister    Cancer Maternal Uncle        unknown form of cancer; cigar smoker   Lung cancer  Maternal Grandmother    Brain cancer Maternal Grandfather    Heart disease Paternal Grandfather    Brain cancer Cousin        dx in his late 81s-40s   Brain cancer Brother 67   Lymphoma Niece 58   Anesthesia problems Neg Hx    Hypotension Neg Hx    Malignant hyperthermia Neg Hx    Pseudochol deficiency Neg Hx    Colon cancer Neg Hx    Colon polyps Neg Hx    Crohn's disease Neg Hx    Ulcerative colitis Neg Hx    Breast cancer Neg Hx     Social History:  Social History   Socioeconomic History   Marital status: Married    Spouse name: Not on file   Number of children: 2   Years of education: Not on file   Highest education level: Not on file  Occupational History   Occupation: Agricultural engineer: [employer]    Comment: [employer]  Tobacco Use   Smoking status: Every Day    Current packs/day: 0.00    Average packs/day: 0.3 packs/day for 45.0 years (13.5 ttl pk-yrs)    Types: Cigarettes    Start date: 07/07/1974    Last attempt to quit: 07/14/2019    Years since quitting: 4.6   Smokeless tobacco: Never  Vaping Use   Vaping status: Never Used  Substance and Sexual Activity   Alcohol use: Not Currently    Alcohol/week: 0.0 standard drinks of alcohol    Comment: stopped July 26, 2014)   Drug use: No    Sexual activity: Not Currently    Birth control/protection: Surgical  Other Topics Concern   Not on file  Social History Narrative   Not on file   Social Drivers of Health   Financial Resource Strain: Not on file  Food Insecurity: Not on file  Transportation Needs: Not on file  Physical Activity: Not on file  Stress: Not on file  Social Connections: Not on file    Allergies:  Allergies  Allergen Reactions   Bee Venom Anaphylaxis   Haloperidol Lactate Other (See Comments)    Convulsions    Meperidine Hcl Hives   Xifaxan [Rifaximin] Diarrhea and Other (See Comments)    Constantly felt the need to use the bathroom     Metabolic Disorder Labs: No results found for: "HGBA1C", "MPG" No results found for: "PROLACTIN" Lab Results  Component Value Date   CHOL 207 (H) 10/23/2012   TRIG 319 (H) 10/23/2012   HDL 33 (L) 10/23/2012   CHOLHDL 6.3 10/23/2012   VLDL 64 (H) 10/23/2012   LDLCALC 110 (H) 10/23/2012   Lab Results  Component Value Date   TSH 0.706 09/22/2015    Therapeutic Level Labs: No results found for: "LITHIUM" No results found for: "VALPROATE" No results found for: "CBMZ"  Current Medications: Current Outpatient Medications  Medication Sig Dispense Refill   ALPRAZolam (XANAX) 1 MG tablet Take 1 tablet (1 mg total) by mouth 2 (two) times daily. 60 tablet 2   sertraline (ZOLOFT) 50 MG tablet Take 1 tablet (50 mg total) by mouth daily. 30 tablet 2   acetaminophen (TYLENOL) 500 MG tablet Take 1,000 mg by mouth every 6 (six) hours as needed for moderate pain or headache.     Calcium-Magnesium-Vitamin D (CALCIUM 500 PO) Take 1 tablet by mouth daily.      Dexlansoprazole 30 MG capsule DR Take 1 capsule by mouth daily.  diclofenac sodium (VOLTAREN) 1 % GEL Apply 1 application topically 3 (three) times daily as needed (pain).      EPINEPHrine (EPI-PEN) 0.3 mg/0.3 mL DEVI Inject 0.3 mg into the muscle as needed (bee stings).      fish oil-omega-3 fatty acids  1000 MG capsule Take 1 g by mouth every morning.      gabapentin (NEURONTIN) 300 MG capsule Take 300 mg by mouth 3 (three) times daily.     hydrochlorothiazide (MICROZIDE) 12.5 MG capsule Take 1 capsule (12.5 mg total) by mouth daily. 30 capsule 0   hydroxychloroquine (PLAQUENIL) 200 MG tablet Take 400 mg by mouth daily.     leflunomide (ARAVA) 20 MG tablet Take 20 mg by mouth daily.     metFORMIN (GLUCOPHAGE) 500 MG tablet Take 500 mg by mouth 2 (two) times daily.     metoprolol succinate (TOPROL-XL) 50 MG 24 hr tablet TAKE 1 TABLET TWICE A DAY  WITH OR IMMEDIATELY        FOLLOWING A MEAL 180 tablet 2   Multiple Vitamin (MULITIVITAMIN WITH MINERALS) TABS Take 1 tablet by mouth every morning.     NIFEdipine (PROCARDIA XL/NIFEDICAL-XL) 90 MG 24 hr tablet Take 1 tablet (90 mg total) by mouth daily. 30 tablet 0   nitroGLYCERIN (NITROSTAT) 0.4 MG SL tablet Place 1 tablet (0.4 mg total) under the tongue every 5 (five) minutes x 3 doses as needed for chest pain. 25 tablet 3   pantoprazole (PROTONIX) 40 MG tablet Take 1 tablet (40 mg total) by mouth daily. Please schedule an appointment for further refills. 30 tablet 0   pravastatin (PRAVACHOL) 20 MG tablet Take 20 mg by mouth daily.     predniSONE (DELTASONE) 10 MG tablet Take 10 mg by mouth. As needed for gout flare up     sertraline (ZOLOFT) 25 MG tablet Take 1 tablet (25 mg total) by mouth daily. 90 tablet 2   sulfaSALAzine (AZULFIDINE) 500 MG tablet Take 500 mg by mouth daily.     tamoxifen (NOLVADEX) 20 MG tablet TAKE 1 TABLET DAILY 90 tablet 1   Current Facility-Administered Medications  Medication Dose Route Frequency Provider Last Rate Last Admin   betamethasone acetate-betamethasone sodium phosphate (CELESTONE) injection 3 mg  3 mg Intramuscular Once Felecia Shelling, DPM         Musculoskeletal: Strength & Muscle Tone: within normal limits Gait & Station: normal Patient leans: N/A  Psychiatric Specialty Exam: Review of Systems   Musculoskeletal:  Positive for arthralgias and joint swelling.  Psychiatric/Behavioral:  Positive for sleep disturbance. The patient is nervous/anxious.   All other systems reviewed and are negative.   There were no vitals taken for this visit.There is no height or weight on file to calculate BMI.  General Appearance: Neat and Well Groomed  Eye Contact:  Good  Speech:  Clear and Coherent  Volume:  Normal  Mood: Anxious  Affect:  Congruent  Thought Process:  Goal Directed  Orientation:  Full (Time, Place, and Person)  Thought Content: Rumination   Suicidal Thoughts:  No  Homicidal Thoughts:  No  Memory:  Immediate;   Good Recent;   Good Remote;   Good  Judgement:  Good  Insight:  Good  Psychomotor Activity:  Normal  Concentration:  Concentration: Good and Attention Span: Good  Recall:  Good  Fund of Knowledge: Good  Language: Good  Akathisia:  No  Handed:  Right  AIMS (if indicated): not done  Assets:  Communication Skills Desire  for Improvement Resilience Social Support Talents/Skills Vocational/Educational  ADL's:  Intact  Cognition: WNL  Sleep:  Fair   Screenings: GAD-7    Flowsheet Row Office Visit from 07/11/2022 in [facility] [place]  Total GAD-7 Score 3      PHQ2-9    Flowsheet Row Office Visit from 07/11/2022 in [facility] [place] Video Visit from 06/07/2022 in [facility] [place] Video Visit from 12/07/2021 in [facility] at [place] Video Visit from 09/07/2021 in [facility] at [place] Video Visit from 08/10/2021 in [facility] Total Score 1 0 0 0 1  PHQ-9 Total Score 7 -- -- -- --      Flowsheet Row Office Visit from 07/11/2022 in [facility] [place] Video Visit from 06/07/2022 in [facility] at [place] Video Visit from 12/07/2021 in [facility] at [place]  C-SSRS RISK CATEGORY No Risk No Risk No Risk        Assessment and Plan: This patient is a 63 year old female with a history of pression and anxiety.  She has been experiencing a good deal more anxiety recently.  She will increase Zoloft 50 mg daily for depression and anxiety and also increase Xanax to 1 mg twice daily.  She will return to see me in 6 weeks or call sooner as needed  Collaboration of Care: Collaboration of Care: Primary Care Provider AEB notes to be shared with PCP at patient's request  Patient/Guardian was advised Release of Information must be obtained prior to any record release in order to collaborate their care with an outside provider. Patient/Guardian was advised if they have not already done so to contact the registration department to sign all necessary forms in order for [place] to release information regarding their care.   Consent: Patient/Guardian gives verbal consent for treatment and assignment of benefits for services provided during this visit. Patient/Guardian expressed understanding and agreed to proceed.    [name], MD 03/18/2024, 1:20 PM

## 2024-04-16 ENCOUNTER — Encounter (HOSPITAL_COMMUNITY): Payer: Self-pay | Admitting: Psychiatry

## 2024-04-16 ENCOUNTER — Telehealth (HOSPITAL_COMMUNITY): Admitting: Psychiatry

## 2024-04-16 DIAGNOSIS — F3341 Major depressive disorder, recurrent, in partial remission: Secondary | ICD-10-CM

## 2024-04-16 MED ORDER — SERTRALINE HCL 50 MG PO TABS: 50.0000 mg | ORAL_TABLET | Freq: Every day | ORAL | 2 refills | Status: DC

## 2024-04-16 MED ORDER — ALPRAZOLAM 1 MG PO TABS: 1.0000 mg | ORAL_TABLET | Freq: Two times a day (BID) | ORAL | 2 refills | Status: DC

## 2024-04-16 NOTE — Progress Notes (Signed)
 Virtual Visit via Video Note  I connected with Rachel Sutton on 04/16/24 at  1:20 PM EDT by a video enabled telemedicine application and verified that I am speaking with the correct person using two identifiers.  Location: Patient: home Provider: office   I discussed the limitations of evaluation and management by telemedicine and the availability of in person appointments. The patient expressed understanding and agreed to proceed.      I discussed the assessment and treatment plan with the patient. The patient was provided an opportunity to ask questions and all were answered. The patient agreed with the plan and demonstrated an understanding of the instructions.   The patient was advised to call back or seek an in-person evaluation if the symptoms worsen or if the condition fails to improve as anticipated.  I provided 20 minutes of non-face-to-face time during this encounter.   [name], MD  [facility] MD/PA/NP OP Progress Note  04/16/2024 1:28 PM [name]  MRN:  [id number]  Chief Complaint:  Chief Complaint  Patient presents with   Depression   Anxiety   Follow-up   HPI: This patient is a 63 year old white female who lives in [place]  with her husband. She works as an Airline pilot for [employer].   The patient returns for follow-up after 4 weeks regarding her depression and anxiety.  Last visit she was feeling worse.  This was after her stepdaughter filed a lawsuit regarding her ex-husband's property.  This was causing her to stay up at night and she was very anxious.  She is also more stressed regarding her job because she has to drive an hour to have both ways and to work now as they are no longer allowing her to work from home except for 1 day a week.  We did increase the Xanax  and the Zoloft  last time.  She states that she is feeling much better.  She is less anxious and she is sleeping better.  She is getting more used to the driving.  She states she  is less upset and irritable and getting along better with her coworkers and husband.  She denies significant depression or thoughts of self-harm or suicide. Visit Diagnosis:    ICD-10-CM   1. Recurrent major depressive disorder, in partial remission (HCC)  F33.41       Past Psychiatric History: Long-term outpatient treatment  Past Medical History:  Past Medical History:  Diagnosis Date   Anxiety    Arthritis    Breast cancer (HCC) 02/22/2012   Right T1c, N0   C. difficile colitis    Colitis, collagenous 09/22/2015   Depression    Essential hypertension    Family history of brain cancer    Family history of esophageal cancer    Family history of lung cancer    Family history of lymphoma    Family history of pancreatic cancer    Fuchs' corneal dystrophy    GERD (gastroesophageal reflux disease)    Hypertension    Mitral valve prolapse    Mild mitral regurgitation   Mixed hyperlipidemia    OSA (obstructive sleep apnea)    Peripheral neuropathy    Seizures (HCC)    Only with dose of haldol; no more since that was stopped.    Past Surgical History:  Procedure Laterality Date   ABDOMINAL HYSTERECTOMY  1999   BACK SURGERY     BIOPSY N/A 09/28/2015   Procedure: BIOPSY;  Surgeon: Alyce Jubilee, MD;  Location: [facility]  ORS;  Service: Endoscopy;  Laterality: N/A;   BIOPSY  04/05/2016   Procedure: BIOPSY;  Surgeon: Alyce Jubilee, MD;  Location: [facility];  Service: Endoscopy;;  colon bx   BREAST LUMPECTOMY  10/30/2002   right   BREAST RECONSTRUCTION WITH PLACEMENT OF TISSUE EXPANDER AND FLEX HD (ACELLULAR HYDRATED DERMIS) Right 09/07/2019   Procedure: RIGHT BREAST RECONSTRUCTION WITH PLACEMENT OF TISSUE EXPANDER AND FLEX HD (ACELLULAR HYDRATED DERMIS);  Surgeon: Thornell Flirt, DO;  Location: [facility] OR;  Service: Plastics;  Laterality: Right;   CARDIAC CATHETERIZATION     Carpal tunnel R/L  1992   Cervical spine fusion X2 since last visit     COLONOSCOPY WITH PROPOFOL  N/A  09/28/2015   Procedure: COLONOSCOPY WITH PROPOFOL ;  Surgeon: Alyce Jubilee, MD;  Location: [facility] ORS;  Service: Endoscopy;  Laterality: N/A;  procedure 1 cecum time in 1232  time out 1246   total time 14 mintes   COLONOSCOPY WITH PROPOFOL  N/A 04/05/2016   Procedure: COLONOSCOPY WITH PROPOFOL ;  Surgeon: Alyce Jubilee, MD;  [facility]: [facility];  Service: Endoscopy;  Laterality: N/A;  1200   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   DEBRIDEMENT AND CLOSURE WOUND Right 02/17/2020   Procedure: Excision of right breast wound with closure and provena placement;  Surgeon: Thornell Flirt, DO;  Location: [facility];  Service: Plastics;  Laterality: Right;   ELBOW SURGERY Right  tennis elbow release 2012   ESOPHAGOGASTRODUODENOSCOPY  10/26/2012   Procedure: ESOPHAGOGASTRODUODENOSCOPY (EGD);  Surgeon: Claudette Cue, MD;  Location: [facility] ENDOSCOPY;  Service: Endoscopy;  Laterality: N/A;   ESOPHAGOGASTRODUODENOSCOPY (EGD) WITH PROPOFOL  N/A 09/28/2015   Procedure: ESOPHAGOGASTRODUODENOSCOPY (EGD) WITH PROPOFOL ;  Surgeon: Alyce Jubilee, MD;  Location: [facility];  Service: Endoscopy;  Laterality: N/A;   EYE SURGERY  2018   corneal transplants bilateral   KNEE ARTHROSCOPY Left    LEFT HEART CATHETERIZATION WITH CORONARY ANGIOGRAM N/A 10/23/2012   Procedure: LEFT HEART CATHETERIZATION WITH CORONARY ANGIOGRAM;  Surgeon: Peter M Swaziland, MD;  Location: [facility] CATH LAB;  Service: Cardiovascular;  Laterality: N/A;   MASTECTOMY Right    MASTECTOMY W/ SENTINEL NODE BIOPSY Right 09/07/2019   Procedure: RIGHT MASTECTOMY WITH SENTINEL LYMPH NODE BIOPSY;  Surgeon: Caralyn Chandler, MD;  Location: [facility];  Service: General;  Laterality: Right;   OVARIAN CYST REMOVAL     prior to hysterectomy, right   Ray cage fusion  1997   REMOVAL OF TISSUE EXPANDER Right 12/31/2019   Procedure: REMOVAL OF TISSUE EXPANDER AND EXCESS SKIN;  Surgeon: Thornell Flirt, DO;  Location: [facility];  Service:  Plastics;  Laterality: Right;  1 hour   SENTINEL LYMPH NODE BIOPSY  10/30/2002   right   TONSILLECTOMY     at six months old    Family Psychiatric History: See below  Family History:  Family History  Problem Relation Age of Onset   Diabetes Mother    Pancreatic cancer Mother 62   Lung cancer Father    Diabetes Father    Heart disease Father    Esophageal cancer Father 67       Beer drinker   Schizophrenia Sister    Cancer Maternal Uncle        unknown form of cancer; cigar smoker   Lung cancer Maternal Grandmother    Brain cancer Maternal Grandfather    Heart disease Paternal Grandfather    Brain cancer Cousin  dx in his late 30s-40s   Brain cancer Brother 42   Lymphoma Niece 58   Anesthesia problems Neg Hx    Hypotension Neg Hx    Malignant hyperthermia Neg Hx    Pseudochol deficiency Neg Hx    Colon cancer Neg Hx    Colon polyps Neg Hx    Crohn's disease Neg Hx    Ulcerative colitis Neg Hx    Breast cancer Neg Hx     Social History:  Social History   Socioeconomic History   Marital status: Married    Spouse name: Not on file   Number of children: 2   Years of education: Not on file   Highest education level: Not on file  Occupational History   Occupation: Agricultural engineer: [employer]    Comment: [employer]  Tobacco Use   Smoking status: Every Day    Current packs/day: 0.00    Average packs/day: 0.3 packs/day for 45.0 years (13.5 ttl pk-yrs)    Types: Cigarettes    Start date: 07/07/1974    Last attempt to quit: 07/14/2019    Years since quitting: 4.7   Smokeless tobacco: Never  Vaping Use   Vaping status: Never Used  Substance and Sexual Activity   Alcohol use: Not Currently    Alcohol/week: 0.0 standard drinks of alcohol    Comment: stopped July 26, 2014)   Drug use: No   Sexual activity: Not Currently    Birth control/protection: Surgical  Other Topics Concern   Not on file  Social History Narrative   Not on file   Social Drivers of  Health   Financial Resource Strain: Not on file  Food Insecurity: Not on file  Transportation Needs: Not on file  Physical Activity: Not on file  Stress: Not on file  Social Connections: Not on file    Allergies:  Allergies  Allergen Reactions   Bee Venom Anaphylaxis   Haloperidol Lactate Other (See Comments)    Convulsions    Meperidine Hcl Hives   Xifaxan  [Rifaximin ] Diarrhea and Other (See Comments)    Constantly felt the need to use the bathroom     Metabolic Disorder Labs: No results found for: "HGBA1C", "MPG" No results found for: "PROLACTIN" Lab Results  Component Value Date   CHOL 207 (H) 10/23/2012   TRIG 319 (H) 10/23/2012   HDL 33 (L) 10/23/2012   CHOLHDL 6.3 10/23/2012   VLDL 64 (H) 10/23/2012   LDLCALC 110 (H) 10/23/2012   Lab Results  Component Value Date   TSH 0.706 09/22/2015    Therapeutic Level Labs: No results found for: "LITHIUM" No results found for: "VALPROATE" No results found for: "CBMZ"  Current Medications: Current Outpatient Medications  Medication Sig Dispense Refill   acetaminophen  (TYLENOL ) 500 MG tablet Take 1,000 mg by mouth every 6 (six) hours as needed for moderate pain or headache.     ALPRAZolam  (XANAX ) 1 MG tablet Take 1 tablet (1 mg total) by mouth 2 (two) times daily. 180 tablet 2   Calcium-Magnesium-Vitamin D  (CALCIUM 500 PO) Take 1 tablet by mouth daily.      Dexlansoprazole 30 MG capsule DR Take 1 capsule by mouth daily.     diclofenac  sodium (VOLTAREN ) 1 % GEL Apply 1 application topically 3 (three) times daily as needed (pain).      EPINEPHrine  (EPI-PEN) 0.3 mg/0.3 mL DEVI Inject 0.3 mg into the muscle as needed (bee stings).      fish oil-omega-3 fatty acids 1000  MG capsule Take 1 g by mouth every morning.      gabapentin  (NEURONTIN ) 300 MG capsule Take 300 mg by mouth 3 (three) times daily.     hydrochlorothiazide  (MICROZIDE ) 12.5 MG capsule Take 1 capsule (12.5 mg total) by mouth daily. 30 capsule 0    hydroxychloroquine  (PLAQUENIL ) 200 MG tablet Take 400 mg by mouth daily.     leflunomide  (ARAVA ) 20 MG tablet Take 20 mg by mouth daily.     metFORMIN (GLUCOPHAGE) 500 MG tablet Take 500 mg by mouth 2 (two) times daily.     metoprolol  succinate (TOPROL -XL) 50 MG 24 hr tablet TAKE 1 TABLET TWICE A DAY  WITH OR IMMEDIATELY        FOLLOWING A MEAL 180 tablet 2   Multiple Vitamin (MULITIVITAMIN WITH MINERALS) TABS Take 1 tablet by mouth every morning.     NIFEdipine  (PROCARDIA  XL/NIFEDICAL-XL) 90 MG 24 hr tablet Take 1 tablet (90 mg total) by mouth daily. 30 tablet 0   nitroGLYCERIN  (NITROSTAT ) 0.4 MG SL tablet Place 1 tablet (0.4 mg total) under the tongue every 5 (five) minutes x 3 doses as needed for chest pain. 25 tablet 3   pantoprazole  (PROTONIX ) 40 MG tablet Take 1 tablet (40 mg total) by mouth daily. Please schedule an appointment for further refills. 30 tablet 0   pravastatin (PRAVACHOL) 20 MG tablet Take 20 mg by mouth daily.     predniSONE  (DELTASONE ) 10 MG tablet Take 10 mg by mouth. As needed for gout flare up     sertraline  (ZOLOFT ) 50 MG tablet Take 1 tablet (50 mg total) by mouth daily. 90 tablet 2   sulfaSALAzine (AZULFIDINE) 500 MG tablet Take 500 mg by mouth daily.     tamoxifen  (NOLVADEX ) 20 MG tablet TAKE 1 TABLET DAILY 90 tablet 1   Current Facility-Administered Medications  Medication Dose Route Frequency Provider Last Rate Last Admin   betamethasone  acetate-betamethasone  sodium phosphate  (CELESTONE ) injection 3 mg  3 mg Intramuscular Once Dot Gazella, DPM         Musculoskeletal: Strength & Muscle Tone: within normal limits Gait & Station: normal Patient leans: N/A  Psychiatric Specialty Exam: Review of Systems  Musculoskeletal:  Positive for arthralgias and joint swelling.  All other systems reviewed and are negative.   There were no vitals taken for this visit.There is no height or weight on file to calculate BMI.  General Appearance: Casual, Neat, and Well  Groomed  Eye Contact:  Good  Speech:  Clear and Coherent  Volume:  Normal  Mood:  Euthymic  Affect:  Congruent  Thought Process:  Goal Directed  Orientation:  Full (Time, Place, and Person)  Thought Content: WDL   Suicidal Thoughts:  No  Homicidal Thoughts:  No  Memory:  Immediate;   Good Recent;   Good Remote;   Good  Judgement:  Good  Insight:  Good  Psychomotor Activity:  Normal  Concentration:  Concentration: Good and Attention Span: Good  Recall:  Good  Fund of Knowledge: Good  Language: Good  Akathisia:  No  Handed:  Right  AIMS (if indicated): not done  Assets:  Communication Skills Desire for Improvement Physical Health Resilience Social Support Talents/Skills  ADL's:  Intact  Cognition: WNL  Sleep:  Good   Screenings: GAD-7    Flowsheet Row Office Visit from 07/11/2022 in [facility] at [place]  Total GAD-7 Score 3      PHQ2-9    Flowsheet Row Office Visit  from 07/11/2022 in [facility] at [place] Video Visit from 06/07/2022 in [facility] at [place] Video Visit from 12/07/2021 in [facility] at [place] Video Visit from 09/07/2021 in [facility] at [place] Video Visit from 08/10/2021 in [facility] at [facility] Total Score 1 0 0 0 1  PHQ-9 Total Score 7 -- -- -- --      Flowsheet Row Office Visit from 07/11/2022 in [facility] at [place] Video Visit from 06/07/2022 in [facility] at [place] Video Visit from 12/07/2021 in [facility] at [place]  C-SSRS RISK CATEGORY No Risk No Risk No Risk        Assessment and Plan: This patient is a 63 year old female with a history of depression and anxiety.  She is doing much better since we increased her medications.  She will  continue Zoloft  50 mg daily for depression and anxiety and Xanax  1 mg twice daily for anxiety.  She will return to see me in 3 months  Collaboration of Care: Collaboration of Care: Primary Care Provider AEB notes will be shared with PCP at patient's request  Patient/Guardian was advised Release of Information must be obtained prior to any record release in order to collaborate their care with an outside provider. Patient/Guardian was advised if they have not already done so to contact the registration department to sign all necessary forms in order for us  to release information regarding their care.   Consent: Patient/Guardian gives verbal consent for treatment and assignment of benefits for services provided during this visit. Patient/Guardian expressed understanding and agreed to proceed.    [name], MD 04/16/2024, 1:28 PM

## 2024-04-22 ENCOUNTER — Ambulatory Visit: Attending: Cardiology | Admitting: Cardiology

## 2024-04-22 ENCOUNTER — Encounter: Payer: Self-pay | Admitting: Cardiology

## 2024-04-22 ENCOUNTER — Other Ambulatory Visit: Payer: Self-pay

## 2024-04-22 VITALS — BP 146/90 | HR 102 | Ht 63.0 in | Wt 188.4 lb

## 2024-04-22 DIAGNOSIS — I341 Nonrheumatic mitral (valve) prolapse: Secondary | ICD-10-CM | POA: Diagnosis not present

## 2024-04-22 DIAGNOSIS — E1165 Type 2 diabetes mellitus with hyperglycemia: Secondary | ICD-10-CM | POA: Diagnosis not present

## 2024-04-22 DIAGNOSIS — Z79899 Other long term (current) drug therapy: Secondary | ICD-10-CM | POA: Diagnosis not present

## 2024-04-22 DIAGNOSIS — I10 Essential (primary) hypertension: Secondary | ICD-10-CM | POA: Diagnosis not present

## 2024-04-22 MED ORDER — PRAVASTATIN SODIUM 20 MG PO TABS: 20.0000 mg | ORAL_TABLET | Freq: Every day | ORAL | 2 refills | Status: AC

## 2024-04-22 MED ORDER — NIFEDIPINE ER OSMOTIC RELEASE 90 MG PO TB24: 90.0000 mg | ORAL_TABLET | Freq: Every day | ORAL | 2 refills | Status: AC

## 2024-04-22 MED ORDER — HYDROXYCHLOROQUINE SULFATE 200 MG PO TABS: 400.0000 mg | ORAL_TABLET | Freq: Every day | ORAL | 2 refills | Status: AC

## 2024-04-22 MED ORDER — PANTOPRAZOLE SODIUM 40 MG PO TBEC: 40.0000 mg | DELAYED_RELEASE_TABLET | Freq: Every day | ORAL | 2 refills | Status: DC

## 2024-04-22 MED ORDER — METOPROLOL SUCCINATE ER 50 MG PO TB24: ORAL_TABLET | ORAL | 2 refills | Status: DC

## 2024-04-22 MED ORDER — LOSARTAN POTASSIUM-HCTZ 50-12.5 MG PO TABS: 1.0000 | ORAL_TABLET | Freq: Every day | ORAL | 3 refills | Status: AC

## 2024-04-22 NOTE — Progress Notes (Signed)
    Cardiology Office Note  Date: 04/22/2024   ID: Rachel Sutton, DOB [birth year], MRN [id number]  History of Present Illness: Rachel Sutton is a 63 y.o. female last seen in January 2024.  She is here for a routine visit.  She does not report any chest pain or palpitations.  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  She checks blood pressure at home, reports systolic generally in the 140s and diastolic in the mid 80s.  We went over her medications.  She states that she has been compliant with her current regimen.  I did review her recent lab work from March.  Her last echocardiogram was in 2022.  We discussed getting an updated study.  I reviewed her ECG today which shows sinus tachycardia with nonspecific ST-T wave abnormalities.  Physical Exam: VS:  BP (!) 146/90 (BP Location: Left Arm)   Pulse (!) 102   Ht 5\' 3"  (1.6 m)   Wt 188 lb 6.4 oz (85.5 kg)   SpO2 95%   BMI 33.37 kg/m , BMI Body mass index is 33.37 kg/m.  Wt Readings from Last 3 Encounters:  04/22/24 188 lb 6.4 oz (85.5 kg)  11/19/23 189 lb 4.8 oz (85.9 kg)  05/21/23 187 lb 11.2 oz (85.1 kg)    General: Patient appears comfortable at rest. HEENT: Conjunctiva and lids normal. Neck: Supple, no elevated JVP or carotid bruits. Lungs: Clear to auscultation, nonlabored breathing at rest. Cardiac: Regular rate and rhythm, no S3, 2/6 apical systolic murmur, no click. Extremities: Trace ankle edema.  ECG:  An ECG dated 01/16/2023 was personally reviewed today and demonstrated:  Sinus rhythm with left atrial enlargement.  Labwork: 11/19/2023: ALT 16; AST 22; BUN 14; Creatinine 0.70; Hemoglobin 12.6; Platelet Count 274; Potassium 3.7; Sodium 140  March 2025: Hemoglobin 13.7, platelets 299, BUN 12, creatinine 0.63, potassium 4.8, AST 44, ALT 27  Other Studies Reviewed Today:  No interval cardiac testing for review today.  Assessment and Plan:  1.  Primary  hypertension.  Blood pressure not optimally controlled.  Plan to stop HCTZ and replace it with Hyzaar 50/12.5 mg daily, check BMET in 2 weeks.  Otherwise continue Toprol -XL 50 mg twice daily and Procardia  XL 90 mg daily.  2.  History of mitral valve prolapse.  No significant mitral regurgitation or leaflet prolapse by last echocardiogram in 2022.  Follow-up ECG reviewed.  Plan to recheck surveillance echocardiogram.  3.  Interval diagnosis of type 2 diabetes mellitus.  Currently on metformin 500 mg twice daily per PCP.  Disposition:  Follow up  1 year.  Signed, Gerard Knight, M.D., F.A.C.C. [facility] at [facility]

## 2024-04-22 NOTE — Patient Instructions (Signed)
 Medication Instructions:  STOP hydrochlorothiazide    START Hyzaar 50/12.5 mg daily  Labwork: BMET in 2 weeks  Testing/Procedures: Your physician has requested that you have an echocardiogram. Echocardiography is a painless test that uses sound waves to create images of your heart. It provides your doctor with information about the size and shape of your heart and how well your heart's chambers and valves are working. This procedure takes approximately one hour. There are no restrictions for this procedure. Please do NOT wear cologne, perfume, aftershave, or lotions (deodorant is allowed). Please arrive 15 minutes prior to your appointment time.  Please note: We ask at that you not bring children with you during ultrasound (echo/ vascular) testing. Due to room size and safety concerns, children are not allowed in the ultrasound rooms during exams. Our front office staff cannot provide observation of children in our lobby area while testing is being conducted. An adult accompanying a patient to their appointment will only be allowed in the ultrasound room at the discretion of the ultrasound technician under special circumstances. We apologize for any inconvenience.   Follow-Up: 1 year  Any Other Special Instructions Will Be Listed Below (If Applicable).  If you need a refill on your cardiac medications before your next appointment, please call your pharmacy.

## 2024-04-28 NOTE — Progress Notes (Unsigned)
 [facility] [address] [place], [place] [address]  Pulmonary Sleep Medicine   Office Visit Note  Patient Name: ANIECE MARRIN DOB: [birth year] MRN [id number]    Chief Complaint: Obstructive Sleep Apnea visit  Brief History:  Mauria is seen today for annual follow up for APAP 7-17cmH20. The patient has a 1 year history of sleep apnea. Patient is using PAP nightly.  The patient feels rested after sleeping with PAP.  The patient reports benefiting from PAP use. Reported sleepiness is  improved and the Epworth Sleepiness Score is 2 out of 24. The patient does not take naps. The patient complains of the following: pt is in need of supplies.  The compliance download shows 92% compliance with an average use time of 6 hours 12 minutes. The AHI is 1.1  The patient does not complain of limb movements disrupting sleep.  ROS  General: (-) fever, (-) chills, (-) night sweat Nose and Sinuses: (-) nasal stuffiness or itchiness, (-) postnasal drip, (-) nosebleeds, (-) sinus trouble. Mouth and Throat: (-) sore throat, (-) hoarseness. Neck: (-) swollen glands, (-) enlarged thyroid, (-) neck pain. Respiratory: - cough, - shortness of breath, - wheezing. Neurologic: + numbness, + tingling. Psychiatric: + anxiety, + depression   Current Medication: Outpatient Encounter Medications as of 04/29/2024  Medication Sig   pioglitazone (ACTOS) 30 MG tablet Take 30 mg by mouth daily.   acetaminophen  (TYLENOL ) 500 MG tablet Take 1,000 mg by mouth every 6 (six) hours as needed for moderate pain or headache.   ALPRAZolam  (XANAX ) 1 MG tablet Take 1 tablet (1 mg total) by mouth 2 (two) times daily.   Calcium-Magnesium-Vitamin D  (CALCIUM 500 PO) Take 1 tablet by mouth daily.    Dexlansoprazole 30 MG capsule DR Take 1 capsule by mouth daily.   diclofenac  sodium (VOLTAREN ) 1 % GEL Apply 1 application topically 3 (three) times daily as needed (pain).    EPINEPHrine  (EPI-PEN) 0.3 mg/0.3 mL DEVI Inject  0.3 mg into the muscle as needed (bee stings).    fish oil-omega-3 fatty acids 1000 MG capsule Take 1 g by mouth every morning.    gabapentin  (NEURONTIN ) 300 MG capsule Take 300 mg by mouth 3 (three) times daily.   hydroxychloroquine  (PLAQUENIL ) 200 MG tablet Take 2 tablets (400 mg total) by mouth daily.   leflunomide  (ARAVA ) 20 MG tablet Take 20 mg by mouth daily.   losartan -hydrochlorothiazide  (HYZAAR) 50-12.5 MG tablet Take 1 tablet by mouth daily.   metFORMIN (GLUCOPHAGE) 500 MG tablet Take 500 mg by mouth 2 (two) times daily.   metoprolol  succinate (TOPROL -XL) 50 MG 24 hr tablet TAKE 1 TABLET TWICE A DAY  WITH OR IMMEDIATELY        FOLLOWING A MEAL.   Multiple Vitamin (MULITIVITAMIN WITH MINERALS) TABS Take 1 tablet by mouth every morning.   NIFEdipine  (PROCARDIA  XL/NIFEDICAL-XL) 90 MG 24 hr tablet Take 1 tablet (90 mg total) by mouth daily.   nitroGLYCERIN  (NITROSTAT ) 0.4 MG SL tablet Place 1 tablet (0.4 mg total) under the tongue every 5 (five) minutes x 3 doses as needed for chest pain.   pantoprazole  (PROTONIX ) 40 MG tablet Take 1 tablet (40 mg total) by mouth daily. Please schedule an appointment for further refills.   pravastatin  (PRAVACHOL ) 20 MG tablet Take 1 tablet (20 mg total) by mouth daily.   predniSONE  (DELTASONE ) 10 MG tablet Take 10 mg by mouth. As needed for gout flare up   sertraline  (ZOLOFT ) 50 MG tablet Take 1 tablet (  50 mg total) by mouth daily.   sulfaSALAzine (AZULFIDINE) 500 MG tablet Take 500 mg by mouth daily.   tamoxifen  (NOLVADEX ) 20 MG tablet TAKE 1 TABLET DAILY   Facility-Administered Encounter Medications as of 04/29/2024  Medication   betamethasone  acetate-betamethasone  sodium phosphate  (CELESTONE ) injection 3 mg    Surgical History: Past Surgical History:  Procedure Laterality Date   ABDOMINAL HYSTERECTOMY  1999   BACK SURGERY     BIOPSY N/A 09/28/2015   Procedure: BIOPSY;  Surgeon: Alyce Jubilee, MD;  Location: [facility];  Service: Endoscopy;   Laterality: N/A;   BIOPSY  04/05/2016   Procedure: BIOPSY;  Surgeon: Alyce Jubilee, MD;  Location: [facility];  Service: Endoscopy;;  colon bx   BREAST LUMPECTOMY  10/30/2002   right   BREAST RECONSTRUCTION WITH PLACEMENT OF TISSUE EXPANDER AND FLEX HD (ACELLULAR HYDRATED DERMIS) Right 09/07/2019   Procedure: RIGHT BREAST RECONSTRUCTION WITH PLACEMENT OF TISSUE EXPANDER AND FLEX HD (ACELLULAR HYDRATED DERMIS);  Surgeon: Thornell Flirt, DO;  Location: [facility];  Service: Plastics;  Laterality: Right;   CARDIAC CATHETERIZATION     Carpal tunnel R/L  1992   Cervical spine fusion X2 since last visit     COLONOSCOPY WITH PROPOFOL  N/A 09/28/2015   Procedure: COLONOSCOPY WITH PROPOFOL ;  Surgeon: Alyce Jubilee, MD;  Location: [facility];  Service: Endoscopy;  Laterality: N/A;  procedure 1 cecum time in 1232  time out 1246   total time 14 mintes   COLONOSCOPY WITH PROPOFOL  N/A 04/05/2016   Procedure: COLONOSCOPY WITH PROPOFOL ;  Surgeon: Alyce Jubilee, MD;  [facility]: [facility];  Service: Endoscopy;  Laterality: N/A;  1200   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   DEBRIDEMENT AND CLOSURE WOUND Right 02/17/2020   Procedure: Excision of right breast wound with closure and provena placement;  Surgeon: Thornell Flirt, DO;  Location: [facility];  Service: Plastics;  Laterality: Right;   ELBOW SURGERY Right  tennis elbow release 2012   ESOPHAGOGASTRODUODENOSCOPY  10/26/2012   Procedure: ESOPHAGOGASTRODUODENOSCOPY (EGD);  Surgeon: Claudette Cue, MD;  Location: [facility] END[facility]COPY;  Service: Endoscopy;  Laterality: N/A;   ESOPHAGOGASTRODUODENOSCOPY (EGD) WITH PROPOFOL  N/A 09/28/2015   Procedure: ESOPHAGOGASTRODUODENOSCOPY (EGD) WITH PROPOFOL ;  Surgeon: Alyce Jubilee, MD;  Location: [facility];  Service: Endoscopy;  Laterality: N/A;   EYE SURGERY  2018   corneal transplants bilateral   KNEE ARTHROSCOPY Left    LEFT HEART CATHETERIZATION WITH CORONARY ANGIOGRAM N/A 10/23/2012   Procedure:  LEFT HEART CATHETERIZATION WITH CORONARY ANGIOGRAM;  Surgeon: Peter M Swaziland, MD;  Location: [facility] CATH LAB;  Service: Cardiovascular;  Laterality: N/A;   MASTECTOMY Right    MASTECTOMY W/ SENTINEL NODE BIOPSY Right 09/07/2019   Procedure: RIGHT MASTECTOMY WITH SENTINEL LYMPH NODE BIOPSY;  Surgeon: Caralyn Chandler, MD;  Location: [facility];  Service: General;  Laterality: Right;   OVARIAN CYST REMOVAL     prior to hysterectomy, right   Ray cage fusion  1997   REMOVAL OF TISSUE EXPANDER Right 12/31/2019   Procedure: REMOVAL OF TISSUE EXPANDER AND EXCESS SKIN;  Surgeon: Thornell Flirt, DO;  Location:  SURGERY CENTER;  Service: Plastics;  Laterality: Right;  1 hour   SENTINEL LYMPH NODE BIOPSY  10/30/2002   right   TONSILLECTOMY     at six months old    Medical History: Past Medical History:  Diagnosis Date   Anxiety    Arthritis    Breast  cancer (HCC) 02/22/2012   Right T1c, N0   C. difficile colitis    Colitis, collagenous 09/22/2015   Depression    Essential hypertension    Family history of brain cancer    Family history of esophageal cancer    Family history of lung cancer    Family history of lymphoma    Family history of pancreatic cancer    Fuchs' corneal dystrophy    GERD (gastroesophageal reflux disease)    Hypertension    Mitral valve prolapse    Mild mitral regurgitation   Mixed hyperlipidemia    OSA (obstructive sleep apnea)    Peripheral neuropathy    Seizures (HCC)    Only with dose of haldol; no more since that was stopped.    Family History: Non contributory to the present illness  Social History: Social History   Socioeconomic History   Marital status: Married    Spouse name: Not on file   Number of children: 2   Years of education: Not on file   Highest education level: Not on file  Occupational History   Occupation: Agricultural engineer: [employer]    Comment: [employer]  Tobacco Use   Smoking status: Every Day    Current packs/day: 0.00     Average packs/day: 0.3 packs/day for 45.0 years (13.5 ttl pk-yrs)    Types: Cigarettes    Start date: 07/07/1974    Last attempt to quit: 07/14/2019    Years since quitting: 4.7   Smokeless tobacco: Never  Vaping Use   Vaping status: Never Used  Substance and Sexual Activity   Alcohol use: Not Currently    Alcohol/week: 0.0 standard drinks of alcohol    Comment: stopped July 26, 2014)   Drug use: No   Sexual activity: Not Currently    Birth control/protection: Surgical  Other Topics Concern   Not on file  Social History Narrative   Not on file   Social Drivers of Health   Financial Resource Strain: Not on file  Food Insecurity: Not on file  Transportation Needs: Not on file  Physical Activity: Not on file  Stress: Not on file  Social Connections: Not on file  Intimate Partner Violence: Not on file    Vital Signs: Blood pressure (!) 142/78, pulse 81, resp. rate 16, height 5\' 2"  (1.575 m), weight 187 lb (84.8 kg), SpO2 96%. Body mass index is 34.2 kg/m.    Examination: General Appearance: The patient is well-developed, well-nourished, and in no distress. Neck Circumference: 39 cm Skin: Gross inspection of skin unremarkable. Head: normocephalic, no gross deformities. Eyes: no gross deformities noted. ENT: ears appear grossly normal Neurologic: Alert and oriented. No involuntary movements.  STOP BANG RISK ASSESSMENT S (snore) Have you been told that you snore?     NO   T (tired) Are you often tired, fatigued, or sleepy during the day?   NO  O (obstruction) Do you stop breathing, choke, or gasp during sleep? NO   P (pressure) Do you have or are you being treated for high blood pressure? YES   B (BMI) Is your body index greater than 35 kg/m? NO   A (age) Are you 16 years old or older? YES   N (neck) Do you have a neck circumference greater than 16 inches?   NO   G (gender) Are you a female? NO   TOTAL STOP/BANG "YES" ANSWERS 2       A STOP-Bang score of  2 or  less is considered low risk, and a score of 5 or more is high risk for having either moderate or severe OSA. For people who score 3 or 4, doctors may need to perform further assessment to determine how likely they are to have OSA.         EPWORTH SLEEPINESS SCALE:  Scale:  (0)= no chance of dozing; (1)= slight chance of dozing; (2)= moderate chance of dozing; (3)= high chance of dozing  Chance  Situtation    Sitting and reading: 0    Watching TV: 1    Sitting Inactive in public: 0    As a passenger in car: 0      Lying down to rest: 1    Sitting and talking: 0    Sitting quielty after lunch: 0    In a car, stopped in traffic: 0   TOTAL SCORE:   2 out of 24    SLEEP STUDIES:  HST - 06/2022 - AHI 15/hr, min Sp02 75% Titration - 07/2022 - APAP @7 -17cmH20   CPAP COMPLIANCE DATA:  Date Range: 04/26/23 - 04/24/24  Average Daily Use: 6 hours 27 minutes  Median Use: 6 hours 35 minutes  Compliance for > 4 Hours: 92% days  AHI: 1.1 respiratory events per hour  Days Used: 351/365  Mask Leak: 9.6  95th Percentile Pressure: 10.9cmH20         LABS: No results found for this or any previous visit (from the past 2160 hours).  Radiology: MM 3D SCREENING MAMMOGRAM UNILATERAL LEFT BREAST Result Date: 03/05/2024 CLINICAL DATA:  Screening. EXAM: DIGITAL SCREENING UNILATERAL LEFT MAMMOGRAM WITH CAD AND TOMOSYNTHESIS TECHNIQUE: Left screening digital craniocaudal and mediolateral oblique mammograms were obtained. Left screening digital breast tomosynthesis was performed. The images were evaluated with computer-aided detection. COMPARISON:  Previous exam(s). ACR Breast Density Category c: The breasts are heterogeneously dense, which may obscure small masses. FINDINGS: The patient has had a right mastectomy. There are no findings suspicious for malignancy. IMPRESSION: No mammographic evidence of malignancy. A result letter of this screening mammogram will be mailed directly  to the patient. RECOMMENDATION: Screening mammogram in one year.  (Code:SM-L-21M) BI-RADS CATEGORY  1: Negative. Electronically Signed   By: Alinda Apley M.D.   On: 03/05/2024 14:38    No results found.  No results found.    Assessment and Plan: Patient Active Problem List   Diagnosis Date Noted   Osteoporosis 12/18/2022   Anxiety 12/21/2020   Body mass index (BMI) 31.0-31.9, adult 12/21/2020   Fatty liver 12/21/2020   History of radiation therapy 12/21/2020   Personal history of malignant neoplasm of breast 12/21/2020   Pure hypercholesterolemia 12/21/2020   Rosacea 12/21/2020   Seronegative rheumatoid arthritis (HCC) 12/21/2020   Cough 06/22/2020   Breast wound, right, initial encounter 02/09/2020   Acquired absence of breast 12/22/2019   Genetic testing 07/20/2019   Family history of brain cancer    Family history of pancreatic cancer    Family history of lymphoma    Family history of esophageal cancer    Family history of lung cancer    Malignant neoplasm of upper-outer quadrant of right breast in female, estrogen receptor positive (HCC) 07/06/2019   Fuchs' corneal dystrophy 12/03/2016   Nuclear sclerotic cataract of both eyes 12/03/2016   GERD (gastroesophageal reflux disease) 05/30/2016   Clostridium difficile colitis 01/25/2016   Colitis, collagenous 09/22/2015   Depression 08/17/2014   Palpitations 04/20/2013   Hyperlipidemia 10/26/2012   Essential hypertension 10/26/2012  Atypical chest pain 10/23/2012   Mitral valve prolapse, nonrheumatic 10/23/2012   OA (osteoarthritis) of knee 08/07/2011      The patient does tolerate PAP and reports benefit from PAP use. The patient was reminded how to adjust mask fit and advised to change supplies regularly. The patient was also counselled on nightly use. The compliance is excellent. The AHI is 1.1. Patient continues to require PAP to treat their apnea and is medically necessary.  1. OSA (obstructive sleep apnea)  (Primary) Continue excellent compliance  2. CPAP use counseling CPAP couseling-Discussed importance of adequate CPAP use as well as proper care and cleaning techniques of machine and all supplies.  3. Essential hypertension Continue current medication and f/u with PCP.  4. Mitral valve prolapse, nonrheumatic Followed by cardiology  5. Malignant neoplasm of upper-outer quadrant of right breast in female, estrogen receptor positive (HCC) Followed by oncology  6. Anxiety and depression Continue current medication and f/u with PCP/psych  7. Obesity (BMI 30.0-34.9) Obesity Counseling: Had a lengthy discussion regarding patients BMI and weight issues. Patient was instructed on portion control as well as increased activity. Also discussed caloric restrictions with trying to maintain intake less than 2000 Kcal. Discussions were made in accordance with the 5As of weight management. Simple actions such as not eating late and if able to, taking a walk is suggested.    General Counseling: I have discussed the findings of the evaluation and examination with Benancio Bracket.  I have also discussed any further diagnostic evaluation thatmay be needed or ordered today. Eara verbalizes understanding of the findings of todays visit. We also reviewed her medications today and discussed drug interactions and side effects including but not limited excessive drowsiness and altered mental states. We also discussed that there is always a risk not just to her but also people around her. she has been encouraged to call the office with any questions or concerns that should arise related to todays visit.  No orders of the defined types were placed in this encounter.       I have personally obtained a history, examined the patient, evaluated laboratory and imaging results, formulated the assessment and plan and placed orders.  This patient was seen by Taylor Favia, PA-C in collaboration with Dr. Cam Cava as a  part of collaborative care agreement.  Cordie Deters, MD [facility] Diplomate ABMS Pulmonary Critical Care Medicine and Sleep Medicine

## 2024-04-29 ENCOUNTER — Ambulatory Visit (INDEPENDENT_AMBULATORY_CARE_PROVIDER_SITE_OTHER): Admitting: Internal Medicine

## 2024-04-29 VITALS — BP 142/78 | HR 81 | Resp 16 | Ht 62.0 in | Wt 187.0 lb

## 2024-04-29 DIAGNOSIS — G4733 Obstructive sleep apnea (adult) (pediatric): Secondary | ICD-10-CM

## 2024-04-29 DIAGNOSIS — I341 Nonrheumatic mitral (valve) prolapse: Secondary | ICD-10-CM | POA: Diagnosis not present

## 2024-04-29 DIAGNOSIS — Z7189 Other specified counseling: Secondary | ICD-10-CM

## 2024-04-29 DIAGNOSIS — Z170 Estrogen receptor positive status [ER+]: Secondary | ICD-10-CM

## 2024-04-29 DIAGNOSIS — E66811 Obesity, class 1: Secondary | ICD-10-CM

## 2024-04-29 DIAGNOSIS — I10 Essential (primary) hypertension: Secondary | ICD-10-CM | POA: Diagnosis not present

## 2024-04-29 DIAGNOSIS — F419 Anxiety disorder, unspecified: Secondary | ICD-10-CM

## 2024-04-29 DIAGNOSIS — C50411 Malignant neoplasm of upper-outer quadrant of right female breast: Secondary | ICD-10-CM

## 2024-04-29 DIAGNOSIS — F32A Depression, unspecified: Secondary | ICD-10-CM

## 2024-04-29 NOTE — Patient Instructions (Signed)

## 2024-04-30 ENCOUNTER — Other Ambulatory Visit: Payer: Self-pay | Admitting: Hematology

## 2024-05-01 ENCOUNTER — Ambulatory Visit: Payer: Self-pay | Admitting: Nurse Practitioner

## 2024-05-01 ENCOUNTER — Ambulatory Visit (HOSPITAL_COMMUNITY)
Admission: RE | Admit: 2024-05-01 | Discharge: 2024-05-01 | Disposition: A | Discharge status: Home / Self Care | Source: Ambulatory Visit | Attending: Cardiology | Admitting: Cardiology

## 2024-05-01 DIAGNOSIS — I341 Nonrheumatic mitral (valve) prolapse: Secondary | ICD-10-CM | POA: Diagnosis present

## 2024-05-01 LAB — ECHOCARDIOGRAM COMPLETE
AR max vel: 3.58 cm2
AV Area VTI: 3.3 cm2
AV Area mean vel: 3.8 cm2
AV Mean grad: 3 mmHg
AV Peak grad: 5.7 mmHg
Ao pk vel: 1.19 m/s
Area-P 1/2: 4.63 cm2
Calc EF: 67 %
MV VTI: 2.7 cm2
S' Lateral: 2.7 cm
Single Plane A2C EF: 63 %
Single Plane A4C EF: 69.9 %

## 2024-05-24 ENCOUNTER — Other Ambulatory Visit: Payer: Self-pay | Admitting: Nurse Practitioner

## 2024-05-24 DIAGNOSIS — C50411 Malignant neoplasm of upper-outer quadrant of right female breast: Secondary | ICD-10-CM

## 2024-05-24 NOTE — Progress Notes (Unsigned)
 [facility]   [phone] [phone]    Patient Care Team: Pa, [facility] as PCP - General Londa Rival, Davida Espy, MD as PCP - Cardiology (Cardiology) Tania Familia, NP as Nurse Practitioner (Nurse Practitioner) Auther Bo, RN as Oncology Nurse Navigator Alane Hsu, RN as Oncology Nurse Navigator Sonja Brant Lake, MD as Consulting Physician (Hematology) Caralyn Chandler, MD as Consulting Physician (General Surgery) Retta Caster, MD as Consulting Physician (Radiation Oncology) Larrie Po, MD as Consulting Physician (Rheumatology) Emil Klassen K, NP as Nurse Practitioner (Nurse Practitioner)   CHIEF COMPLAINT: Follow up right breast cancer   Oncology History  Malignant neoplasm of upper-outer quadrant of right breast in female, estrogen receptor positive (HCC)  07/01/2019 Mammogram   Diagnostic mammogram 07/01/19  IMPRESSION: Suspicious mass in the 9:30 region of the right breast 9 cm from the nipple measuring 1.3 x 1.3 x 1.1 cm. Suspicious mass in the right axilla measuring 5 x 4 x 4 mm.   07/02/2019 Cancer Staging   Staging form: Breast, AJCC 8th Edition - Clinical stage from 07/02/2019: Stage IA (cT1c, cN0, cM0, G2, ER+, PR+, HER2: Equivocal) - Signed by Sonja Chignik, MD on 07/07/2019   07/02/2019 Initial Biopsy   Diagnosis 07/02/19 1. Breast, right, needle core biopsy, 9:30 o'clock - INVASIVE DUCTAL CARCINOMA, GRADE 1-2. SEE NOTE - DUCTAL CARCINOMA IN SITU, INTERMEDIATE GRADE 2. Lymph node, needle/core biopsy, right axilla - LYMPH NODE, NEGATIVE FOR CARCINOMA   07/02/2019 Receptors her2   By immunohistochemistry, the tumor cells are EQUIVOCAL for Her2 (2+). HER2 by FISH will be PERFORMED and the RESULTS REPORTED SEPARATELY Estrogen Receptor: 100%, POSITIVE, STRONG STAINING INTENSITY Progesterone Receptor: 90%, POSITIVE, STRONG STAINING INTENSITY Proliferation Marker Ki67: 10%   07/06/2019 Initial Diagnosis    Malignant neoplasm of upper-outer quadrant of right breast in female, estrogen receptor positive (HCC)   07/08/2019 -  Anti-estrogen oral therapy   Anastrozole  1mg  once daily starting 07/08/19 before surgery.    07/20/2019 Genetic Testing   Negative genetic testing on the [facility] Multi-Cancer Panel. The report date is 07/20/2019.  The Multi-Gene Panel offered by [facility] includes sequencing and/or deletion duplication testing of the following 85 genes: AIP, ALK, APC, ATM, AXIN2,BAP1,  BARD1, BLM, BMPR1A, BRCA1, BRCA2, BRIP1, CASR, CDC73, CDH1, CDK4, CDKN1B, CDKN1C, CDKN2A (p14ARF), CDKN2A (p16INK4a), CEBPA, CHEK2, CTNNA1, DICER1, DIS3L2, EGFR (c.2369C>T, p.Thr790Met variant only), EPCAM (Deletion/duplication testing only), FH, FLCN, GATA2, GPC3, GREM1 (Promoter region deletion/duplication testing only), HOXB13 (c.251G>A, p.Gly84Glu), HRAS, KIT, MAX, MEN1, MET, MITF (c.952G>A, p.Glu318Lys variant only), MLH1, MSH2, MSH3, MSH6, MUTYH, NBN, NF1, NF2, NTHL1, PALB2, PDGFRA, PHOX2B, PMS2, POLD1, POLE, POT1, PRKAR1A, PTCH1, PTEN, RAD50, RAD51C, RAD51D, RB1, RECQL4, RET, RNF43, RUNX1, SDHAF2, SDHA (sequence changes only), SDHB, SDHC, SDHD, SMAD4, SMARCA4, SMARCB1, SMARCE1, STK11, SUFU, TERC, TERT, TMEM127, TP53, TSC1, TSC2, VHL, WRN and WT1.     08/29/2019 Surgery   RIGHT MASTECTOMY WITH SENTINEL LYMPH NODE BIOPSY and RECONSTRUCTION WITH PLACEMENT OF TISSUE EXPANDER AND FLEX HD (ACELLULAR HYDRATED DERMIS) by Dr. Alethea Andes and Dr Orin Birk  09/07/19   09/07/2019 Pathology Results   DIAGNOSIS: 09/07/19  A. SENTINEL LYMPH NODE, RIGHT # 1, BIOPSY:  - One lymph node, negative for carcinoma (0/1).   B. SENTINEL LYMPH NODE, RIGHT # 2, BIOPSY:  - One lymph node, negative for carcinoma (0/1).   C. BREAST, RIGHT, MASTECTOMY:  - Invasive ductal carcinoma, 1.8 cm, Nottingham grade 1 of 3.  - Margins of resection are not involved (  Closest margin: 20 mm, deep).  - Ductal carcinoma in situ, focal.  - Biopsy site changes.  -  See oncology table.    09/07/2019 Oncotype testing   Oncotype RS: 17, meaning there is 5% distant recurrence risk at 9 years with AI/tamoxifen  alone. Indicating <1% chemo benefit    10/28/2019 Cancer Staging   Staging form: Breast, AJCC 8th Edition - Pathologic: Stage IA (pT1c, pN0, cM0, G1, ER+, PR+, HER2-, Oncotype DX score: 17) - Signed by Sonja Verdigre, MD on 10/28/2019   12/31/2019 Pathology Results   FINAL MICROSCOPIC DIAGNOSIS:  A. SKIN, RIGHT BREAST MASTECTOMY FLAP AND SCAR:  - Skin and subcutaneous tissue with fibrosis, mild inflammation and  focal giant cell reaction consistent with scar.  - No evidence of malignancy.    02/24/2020 Survivorship   SCP delivered by Brighten Orndoff, NP       CURRENT THERAPY: Tamoxifen  and zometa    INTERVAL HISTORY Ms. Abramovich returns for follow up as scheduled. Last seen 11/19/23.   ROS   Past Medical History:  Diagnosis Date   Anxiety    Arthritis    Breast cancer (HCC) 02/22/2012   Right T1c, N0   C. difficile colitis    Colitis, collagenous 09/22/2015   Depression    Essential hypertension    Family history of brain cancer    Family history of esophageal cancer    Family history of lung cancer    Family history of lymphoma    Family history of pancreatic cancer    Fuchs' corneal dystrophy    GERD (gastroesophageal reflux disease)    Hypertension    Mitral valve prolapse    Mild mitral regurgitation   Mixed hyperlipidemia    OSA (obstructive sleep apnea)    Peripheral neuropathy    Seizures (HCC)    Only with dose of haldol; no more since that was stopped.     Past Surgical History:  Procedure Laterality Date   ABDOMINAL HYSTERECTOMY  1999   BACK SURGERY     BIOPSY N/A 09/28/2015   Procedure: BIOPSY;  Surgeon: Alyce Jubilee, MD;  Location: [facility];  Service: Endoscopy;  Laterality: N/A;   BIOPSY  04/05/2016   Procedure: BIOPSY;  Surgeon: Alyce Jubilee, MD;  [facility];  Service: Endoscopy;;  colon bx   BREAST  LUMPECTOMY  10/30/2002   right   BREAST RECONSTRUCTION WITH PLACEMENT OF TISSUE EXPANDER AND FLEX HD (ACELLULAR HYDRATED DERMIS) Right 09/07/2019   Procedure: RIGHT BREAST RECONSTRUCTION WITH PLACEMENT OF TISSUE EXPANDER AND FLEX HD (ACELLULAR HYDRATED DERMIS);  Surgeon: Thornell Flirt, DO;  Location: [facility];  Service: Plastics;  Laterality: Right;   CARDIAC CATHETERIZATION     Carpal tunnel R/L  1992   Cervical spine fusion X2 since last visit     COLONOSCOPY WITH PROPOFOL  N/A 09/28/2015   Procedure: COLONOSCOPY WITH PROPOFOL ;  Surgeon: Alyce Jubilee, MD;  Location: [facility];  Service: Endoscopy;  Laterality: N/A;  procedure 1 cecum time in 1232  time out 1246   total time 14 mintes   COLONOSCOPY WITH PROPOFOL  N/A 04/05/2016   Procedure: COLONOSCOPY WITH PROPOFOL ;  Surgeon: Alyce Jubilee, MD;  [facility];  Service: Endoscopy;  Laterality: N/A;  1200   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   DEBRIDEMENT AND CLOSURE WOUND Right 02/17/2020   Procedure: Excision of right breast wound with closure and provena placement;  Surgeon: Thornell Flirt, DO;  Location: [facility];  Service: Plastics;  Laterality: Right;   ELBOW SURGERY Right  tennis elbow release 2012   ESOPHAGOGASTRODUODENOSCOPY  10/26/2012   Procedure: ESOPHAGOGASTRODUODENOSCOPY (EGD);  Surgeon: Claudette Cue, MD;  Location: [facility] ENDOSCOPY;  Service: Endoscopy;  Laterality: N/A;   ESOPHAGOGASTRODUODENOSCOPY (EGD) WITH PROPOFOL  N/A 09/28/2015   Procedure: ESOPHAGOGASTRODUODENOSCOPY (EGD) WITH PROPOFOL ;  Surgeon: Alyce Jubilee, MD;  Location: [facility];  Service: Endoscopy;  Laterality: N/A;   EYE SURGERY  2018   corneal transplants bilateral   KNEE ARTHROSCOPY Left    LEFT HEART CATHETERIZATION WITH CORONARY ANGIOGRAM N/A 10/23/2012   Procedure: LEFT HEART CATHETERIZATION WITH CORONARY ANGIOGRAM;  Surgeon: Peter M Swaziland, MD;  Location: [facility] C[facility];  Service: Cardiovascular;  Laterality: N/A;    MASTECTOMY Right    MASTECTOMY W/ SENTINEL NODE BIOPSY Right 09/07/2019   Procedure: RIGHT MASTECTOMY WITH SENTINEL LYMPH NODE BIOPSY;  Surgeon: Caralyn Chandler, MD;  [facility];  Service: General;  Laterality: Right;   OVARIAN CYST REMOVAL     prior to hysterectomy, right   Ray cage fusion  1997   REMOVAL OF TISSUE EXPANDER Right 12/31/2019   Procedure: REMOVAL OF TISSUE EXPANDER AND EXCESS SKIN;  Surgeon: Thornell Flirt, DO;  Location: [facility];  Service: Plastics;  Laterality: Right;  1 hour   SENTINEL LYMPH NODE BIOPSY  10/30/2002   right   TONSILLECTOMY     at six months old     Outpatient Encounter Medications as of 05/26/2024  Medication Sig   acetaminophen  (TYLENOL ) 500 MG tablet Take 1,000 mg by mouth every 6 (six) hours as needed for moderate pain or headache.   ALPRAZolam  (XANAX ) 1 MG tablet Take 1 tablet (1 mg total) by mouth 2 (two) times daily.   Calcium-Magnesium-Vitamin D  (CALCIUM 500 PO) Take 1 tablet by mouth daily.    Dexlansoprazole 30 MG capsule DR Take 1 capsule by mouth daily.   diclofenac  sodium (VOLTAREN ) 1 % GEL Apply 1 application topically 3 (three) times daily as needed (pain).    EPINEPHrine  (EPI-PEN) 0.3 mg/0.3 mL DEVI Inject 0.3 mg into the muscle as needed (bee stings).    fish oil-omega-3 fatty acids 1000 MG capsule Take 1 g by mouth every morning.    gabapentin  (NEURONTIN ) 300 MG capsule Take 300 mg by mouth 3 (three) times daily.   hydroxychloroquine  (PLAQUENIL ) 200 MG tablet Take 2 tablets (400 mg total) by mouth daily.   leflunomide  (ARAVA ) 20 MG tablet Take 20 mg by mouth daily.   losartan -hydrochlorothiazide  (HYZAAR) 50-12.5 MG tablet Take 1 tablet by mouth daily.   metFORMIN (GLUCOPHAGE) 500 MG tablet Take 500 mg by mouth 2 (two) times daily.   metoprolol  succinate (TOPROL -XL) 50 MG 24 hr tablet TAKE 1 TABLET TWICE A DAY  WITH OR IMMEDIATELY        FOLLOWING A MEAL.   Multiple Vitamin (MULITIVITAMIN WITH MINERALS) TABS Take  1 tablet by mouth every morning.   NIFEdipine  (PROCARDIA  XL/NIFEDICAL-XL) 90 MG 24 hr tablet Take 1 tablet (90 mg total) by mouth daily.   nitroGLYCERIN  (NITROSTAT ) 0.4 MG SL tablet Place 1 tablet (0.4 mg total) under the tongue every 5 (five) minutes x 3 doses as needed for chest pain.   pantoprazole  (PROTONIX ) 40 MG tablet Take 1 tablet (40 mg total) by mouth daily. Please schedule an appointment for further refills.   pioglitazone (ACTOS) 30 MG tablet Take 30 mg by mouth daily.   pravastatin  (PRAVACHOL ) 20 MG tablet Take  1 tablet (20 mg total) by mouth daily.   predniSONE  (DELTASONE ) 10 MG tablet Take 10 mg by mouth. As needed for gout flare up   sertraline  (ZOLOFT ) 50 MG tablet Take 1 tablet (50 mg total) by mouth daily.   sulfaSALAzine (AZULFIDINE) 500 MG tablet Take 500 mg by mouth daily.   tamoxifen  (NOLVADEX ) 20 MG tablet TAKE 1 TABLET DAILY   Facility-Administered Encounter Medications as of 05/26/2024  Medication   betamethasone  acetate-betamethasone  sodium phosphate  (CELESTONE ) injection 3 mg     There were no vitals filed for this visit. There is no height or weight on file to calculate BMI.   ECOG PERFORMANCE STATUS: {CHL ONC ECOG PS:[id number]}  PHYSICAL EXAM GENERAL:alert, no distress and comfortable SKIN: no rash  EYES: sclera clear NECK: without mass LYMPH:  no palpable cervical or supraclavicular lymphadenopathy  LUNGS: clear with normal breathing effort HEART: regular rate & rhythm, no lower extremity edema ABDOMEN: abdomen soft, non-tender and normal bowel sounds NEURO: alert & oriented x 3 with fluent speech, no focal motor/sensory deficits Breast exam:  PAC without erythema    CBC    Latest Ref Rng & Units 11/19/2023   10:23 AM 05/21/2023    9:49 AM 11/20/2022    9:30 AM  CBC  WBC 4.0 - 10.5 K/uL 7.4  9.0  8.3   Hemoglobin 12.0 - 15.0 g/dL 41.3  24.4  01.0   Hematocrit 36.0 - 46.0 % 37.1  36.9  40.2   Platelets 150 - 400 K/uL 274  289  256        CMP     Latest Ref Rng & Units 11/19/2023   10:23 AM 05/21/2023    9:49 AM 11/20/2022    9:30 AM  CMP  Glucose 70 - 99 mg/dL 272  536  644   BUN 8 - 23 mg/dL 14  15  10    Creatinine 0.44 - 1.00 mg/dL 0.34  7.42  5.95   Sodium 135 - 145 mmol/L 140  139  141   Potassium 3.5 - 5.1 mmol/L 3.7  4.2  4.2   Chloride 98 - 111 mmol/L 106  104  105   CO2 22 - 32 mmol/L 25  29  28    Calcium 8.9 - 10.3 mg/dL 9.1  9.0  9.6   Total Protein 6.5 - 8.1 g/dL 6.7  7.0  7.3   Total Bilirubin <1.2 mg/dL 0.4  0.4  0.5   Alkaline Phos 38 - 126 U/L 58  68  68   AST 15 - 41 U/L 22  26  30    ALT 0 - 44 U/L 16  17  25        ASSESSMENT & PLAN:Rachel Sutton is a 63 y.o. female with    1. Malignant neoplasm of upper-outer quadrant of right breast, Stage IA, pT1cN0M0, ER+/PP+, HER2-, Grade 1 -Diagnosed in 08/2019. Given prior right lumpectomy and RT she underwent right mastectomy on 09/07/19. She has a low risk RS of 17 and adjuvant chemotherapy was not recommended. -Node-negative, postmastectomy radiation was not required. She underwent right breast reconstruction on 12/31/19 with wound complications afterward and it was removed.  -She started anastrozole  before surgery on 10/28/19.  Goal is for a total of 5-7 years. She is tolerating well with mood swings and arthralgia, ultimately switched to tamoxifen  11/23/2021. Tolerating better with stable hot flashes -L mammo 03/04/2024 was benign   2. H/o Right breast ductal carcinoma, pT1cN0M0 stage IA, ER-/PR-, HER2 +, Grade  3, Genetic testing was negative for pathogenetic mutations.  -Diagnosed on 10/16/02 managed by Dr. Bartley Borrow  -Treated with right lumpectomy and SNLB, Adjuvant chemo AC-Docetaxol/Herceptin and RT   3. Osteopenia  -Her 11/2012 DEXA was normal.  -07/2020 DEXA showed osteopenia with lowest T-score -1.1 at left hip.  -Repeat DEXA 12/14/2022 showed worsening osteopenia in the femur and new osteoporosis at the left forearm lowest T-score  -3.0 -Began Zometa  05/21/23 (q6 mo x4 doses), started calcium/Vit D   4. Rheumatoid arthritis, diagnosed in 2018.  -She is seen by Rheumatologist Dr. Bascom Lily -She was previously on low dose weekly methotrexate and Cimzia self-injections q2weeks. She stopped after breast cancer diagnosis in 08/2019 -Stable -Continue rheumatology follow-up   5. H/o Mitral Valve, HTN, GERD, Smoking Cessation -Had cardiac cath in 2013, no stent placed.  -She used to smoke 2 ppd. She has a 30+ year history.  Previously counseled on smoking cessation.    6. Anxiety -She was on Paxil  before and currently on Xanax  0.5mg  -she is followed by Dr. Avanell Bob with behavioral health.    PLAN:  No orders of the defined types were placed in this encounter.     All questions were answered. The patient knows to call the clinic with any problems, questions or concerns. No barriers to learning were detected. I spent *** counseling the patient face to face. The total time spent in the appointment was *** and more than 50% was on counseling, review of test results, and coordination of care.   Nikol Lemar K Elvi Leventhal, NP 05/24/2024 9:14 PM

## 2024-05-26 ENCOUNTER — Inpatient Hospital Stay: Payer: Commercial Managed Care - PPO

## 2024-05-26 ENCOUNTER — Inpatient Hospital Stay: Payer: Commercial Managed Care - PPO | Admitting: Nurse Practitioner

## 2024-05-26 ENCOUNTER — Inpatient Hospital Stay: Payer: Commercial Managed Care - PPO | Attending: Nurse Practitioner

## 2024-05-26 ENCOUNTER — Encounter: Payer: Self-pay | Admitting: Nurse Practitioner

## 2024-05-26 VITALS — BP 124/82 | HR 88 | Temp 97.6°F | Resp 17 | Ht 62.0 in | Wt 186.9 lb

## 2024-05-26 DIAGNOSIS — Z79810 Long term (current) use of selective estrogen receptor modulators (SERMs): Secondary | ICD-10-CM | POA: Diagnosis not present

## 2024-05-26 DIAGNOSIS — M8589 Other specified disorders of bone density and structure, multiple sites: Secondary | ICD-10-CM | POA: Insufficient documentation

## 2024-05-26 DIAGNOSIS — Z170 Estrogen receptor positive status [ER+]: Secondary | ICD-10-CM | POA: Insufficient documentation

## 2024-05-26 DIAGNOSIS — Z87891 Personal history of nicotine dependence: Secondary | ICD-10-CM | POA: Diagnosis not present

## 2024-05-26 DIAGNOSIS — C50411 Malignant neoplasm of upper-outer quadrant of right female breast: Secondary | ICD-10-CM | POA: Diagnosis not present

## 2024-05-26 DIAGNOSIS — M810 Age-related osteoporosis without current pathological fracture: Secondary | ICD-10-CM

## 2024-05-26 LAB — CMP (CANCER CENTER ONLY)
ALT: 22 U/L (ref 0–44)
AST: 32 U/L (ref 15–41)
Albumin: 4.3 g/dL (ref 3.5–5.0)
Alkaline Phosphatase: 67 U/L (ref 38–126)
Anion gap: 11 (ref 5–15)
BUN: 15 mg/dL (ref 8–23)
CO2: 24 mmol/L (ref 22–32)
Calcium: 9.6 mg/dL (ref 8.9–10.3)
Chloride: 103 mmol/L (ref 98–111)
Creatinine: 0.7 mg/dL (ref 0.44–1.00)
GFR, Estimated: >60 mL/min (ref >60)
Glucose, Bld: 174 mg/dL — ABNORMAL HIGH (ref 70–99)
Potassium: 4.1 mmol/L (ref 3.5–5.1)
Sodium: 138 mmol/L (ref 135–145)
Total Bilirubin: 0.6 mg/dL (ref 0.0–1.2)
Total Protein: 7.5 g/dL (ref 6.5–8.1)

## 2024-05-26 LAB — CBC WITH DIFFERENTIAL (CANCER CENTER ONLY)
Abs Immature Granulocytes: 0.04 10*3/uL (ref 0.00–0.07)
Basophils Absolute: 0.1 10*3/uL (ref 0.0–0.1)
Basophils Relative: 1 %
Eosinophils Absolute: 0.1 10*3/uL (ref 0.0–0.5)
Eosinophils Relative: 2 %
HCT: 38.3 % (ref 36.0–46.0)
Hemoglobin: 13.4 g/dL (ref 12.0–15.0)
Immature Granulocytes: 1 %
Lymphocytes Relative: 21 %
Lymphs Abs: 1.5 10*3/uL (ref 0.7–4.0)
MCH: 30.4 pg (ref 26.0–34.0)
MCHC: 35 g/dL (ref 30.0–36.0)
MCV: 86.8 fL (ref 80.0–100.0)
Monocytes Absolute: 0.3 10*3/uL (ref 0.1–1.0)
Monocytes Relative: 4 %
Neutro Abs: 5 10*3/uL (ref 1.7–7.7)
Neutrophils Relative %: 71 %
Platelet Count: 281 10*3/uL (ref 150–400)
RBC: 4.41 MIL/uL (ref 3.87–5.11)
RDW: 13.7 % (ref 11.5–15.5)
WBC Count: 7 10*3/uL (ref 4.0–10.5)
nRBC: 0 % (ref 0.0–0.2)

## 2024-05-26 MED ORDER — ZOLEDRONIC ACID 4 MG/100ML IV SOLN
4.0000 mg | Freq: Once | INTRAVENOUS | Status: AC
  Administered 2024-05-26: 4 mg via INTRAVENOUS
  Filled 2024-05-26: qty 100

## 2024-05-26 MED ORDER — SODIUM CHLORIDE 0.9 % IV SOLN: Freq: Once | INTRAVENOUS | Status: AC

## 2024-05-26 NOTE — Patient Instructions (Signed)

## 2024-05-27 ENCOUNTER — Encounter: Payer: Self-pay | Admitting: Nurse Practitioner

## 2024-05-27 ENCOUNTER — Other Ambulatory Visit: Payer: Self-pay

## 2024-06-10 ENCOUNTER — Encounter: Payer: Self-pay | Admitting: Nurse Practitioner

## 2024-06-11 ENCOUNTER — Ambulatory Visit: Admitting: Podiatry

## 2024-06-11 ENCOUNTER — Ambulatory Visit (INDEPENDENT_AMBULATORY_CARE_PROVIDER_SITE_OTHER)

## 2024-06-11 ENCOUNTER — Encounter: Payer: Self-pay | Admitting: Podiatry

## 2024-06-11 DIAGNOSIS — M7671 Peroneal tendinitis, right leg: Secondary | ICD-10-CM

## 2024-06-11 DIAGNOSIS — M79672 Pain in left foot: Secondary | ICD-10-CM

## 2024-06-11 DIAGNOSIS — M19071 Primary osteoarthritis, right ankle and foot: Secondary | ICD-10-CM

## 2024-06-11 DIAGNOSIS — M79671 Pain in right foot: Secondary | ICD-10-CM | POA: Diagnosis not present

## 2024-06-11 DIAGNOSIS — M7672 Peroneal tendinitis, left leg: Secondary | ICD-10-CM | POA: Diagnosis not present

## 2024-06-11 DIAGNOSIS — M216X9 Other acquired deformities of unspecified foot: Secondary | ICD-10-CM

## 2024-06-11 DIAGNOSIS — M19072 Primary osteoarthritis, left ankle and foot: Secondary | ICD-10-CM

## 2024-06-11 MED ORDER — MELOXICAM 15 MG PO TABS: 15.0000 mg | ORAL_TABLET | Freq: Every day | ORAL | 0 refills | Status: AC

## 2024-06-11 MED ORDER — TRIAMCINOLONE ACETONIDE 10 MG/ML IJ SUSP
10.0000 mg | Freq: Once | INTRAMUSCULAR | Status: AC
  Administered 2024-06-11: 10 mg

## 2024-06-11 NOTE — Progress Notes (Signed)
 Chief Complaint  Patient presents with   Foot Pain    Patient states she has been having foot pain for about 7 months now and that it is mainly her left dorsal, the bone in her left foot is sticking up and it hurts to touch it or to even walk. Patient is taking medication for pain and is having some swelling in right dorsal also but the left foot is more painful     HPI: 63 y.o. female presents today with bilateral foot pain, left worse than right.  She reports that this has been going on for months.  She reports pain to the outside of the hindfoot and fifth met base.  Also endorses pain and swelling to the dorsum of the bilateral foot.  She does report history of rheumatoid arthritis, tobacco use, has dealt with radiculopathy as well previously.  Past Medical History:  Diagnosis Date   Anxiety    Arthritis    Breast cancer (HCC) 02/22/2012   Right T1c, N0   C. difficile colitis    Colitis, collagenous 09/22/2015   Depression    Essential hypertension    Family history of brain cancer    Family history of esophageal cancer    Family history of lung cancer    Family history of lymphoma    Family history of pancreatic cancer    Fuchs' corneal dystrophy    GERD (gastroesophageal reflux disease)    Hypertension    Mitral valve prolapse    Mild mitral regurgitation   Mixed hyperlipidemia    OSA (obstructive sleep apnea)    Peripheral neuropathy    Seizures (HCC)    Only with dose of haldol; no more since that was stopped.    Past Surgical History:  Procedure Laterality Date   ABDOMINAL HYSTERECTOMY  1999   BACK SURGERY     BIOPSY N/A 09/28/2015   Procedure: BIOPSY;  Surgeon: Margo LITTIE Haddock, MD;  Location: [facility];  Service: Endoscopy;  Laterality: N/A;   BIOPSY  04/05/2016   Procedure: BIOPSY;  Surgeon: Margo LITTIE Haddock, MD;  [facility];  Service: Endoscopy;;  colon bx   BREAST LUMPECTOMY  10/30/2002   right   BREAST RECONSTRUCTION WITH PLACEMENT OF TISSUE  EXPANDER AND FLEX HD (ACELLULAR HYDRATED DERMIS) Right 09/07/2019   Procedure: RIGHT BREAST RECONSTRUCTION WITH PLACEMENT OF TISSUE EXPANDER AND FLEX HD (ACELLULAR HYDRATED DERMIS);  Surgeon: Lowery Estefana RAMAN, DO;  Location: [facility];  Service: Plastics;  Laterality: Right;   CARDIAC CATHETERIZATION     Carpal tunnel R/L  1992   Cervical spine fusion X2 since last visit     COLONOSCOPY WITH PROPOFOL  N/A 09/28/2015   Procedure: COLONOSCOPY WITH PROPOFOL ;  Surgeon: Margo LITTIE Haddock, MD;  Location: [facility];  Service: Endoscopy;  Laterality: N/A;  procedure 1 cecum time in 1232  time out 1246   total time 14 mintes   COLONOSCOPY WITH PROPOFOL  N/A 04/05/2016   Procedure: COLONOSCOPY WITH PROPOFOL ;  Surgeon: Margo LITTIE Haddock, MD;  [facility];  Service: Endoscopy;  Laterality: N/A;  1200   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   DEBRIDEMENT AND CLOSURE WOUND Right 02/17/2020   Procedure: Excision of right breast wound with closure and provena placement;  Surgeon: Lowery Estefana RAMAN, DO;  Location: [facility];  Service: Plastics;  Laterality: Right;   ELBOW SURGERY Right  tennis elbow release 2012   ESOPHAGOGASTRODUODENOSCOPY  10/26/2012  Procedure: ESOPHAGOGASTRODUODENOSCOPY (EGD);  Surgeon: Lamar JONETTA Aho, MD;  Location: [facility] END[facility];  Service: Endoscopy;  Laterality: N/A;   ESOPHAGOGASTRODUODENOSCOPY (EGD) WITH PROPOFOL  N/A 09/28/2015   Procedure: ESOPHAGOGASTRODUODENOSCOPY (EGD) WITH PROPOFOL ;  Surgeon: Margo LITTIE Haddock, MD;  Location: [facility];  Service: Endoscopy;  Laterality: N/A;   EYE SURGERY  2018   corneal transplants bilateral   KNEE ARTHROSCOPY Left    LEFT HEART CATHETERIZATION WITH CORONARY ANGIOGRAM N/A 10/23/2012   Procedure: LEFT HEART CATHETERIZATION WITH CORONARY ANGIOGRAM;  Surgeon: Peter M Swaziland, MD;  Location[facility] C[facility] LAB;  Service: Cardiovascular;  Laterality: N/A;   MASTECTOMY Right    MASTECTOMY W/ SENTINEL NODE BIOPSY Right 09/07/2019   Procedure:  RIGHT MASTECTOMY WITH SENTINEL LYMPH NODE BIOPSY;  Surgeon: Curvin Deward MOULD, MD;  Location: [facility];  Service: General;  Laterality: Right;   OVARIAN CYST REMOVAL     prior to hysterectomy, right   Ray cage fusion  1997   REMOVAL OF TISSUE EXPANDER Right 12/31/2019   Procedure: REMOVAL OF TISSUE EXPANDER AND EXCESS SKIN;  Surgeon: Lowery Estefana RAMAN, DO;  Location: [facility];  Service: Plastics;  Laterality: Right;  1 hour   SENTINEL LYMPH NODE BIOPSY  10/30/2002   right   TONSILLECTOMY     at six months old    Allergies  Allergen Reactions   Bee Venom Anaphylaxis   Haloperidol Lactate Other (See Comments)    Convulsions    Meperidine Hcl Hives   Xifaxan  [Rifaximin ] Diarrhea and Other (See Comments)    Constantly felt the need to use the bathroom     ROS    Physical Exam: There were no vitals filed for this visit.  General: The patient is alert and oriented x3 in no acute distress.  Dermatology: Skin is warm, dry and supple bilateral lower extremities. Interspaces are clear of maceration and debris.    Vascular: Palpable pedal pulses bilaterally. Capillary refill within normal limits.  Localized edema to dorsal midfoot left greater than right  Neurological: Light touch sensation grossly intact bilateral feet.  Protective sensation threshold decreased  Musculoskeletal Exam: High arched foot type bilaterally.  Localized edema with tenderness on palpation left greater than right to dorsal midfoot left greater than right there is significant tenderness on palpation of peroneal tendons laterally around ankle and towards fifth met base.  Pain with resisted eversion left greater than right.  Hammertoe contractures noted.    Radiographic Exam: Left and right 3 views weightbearing 06/11/2024 Significant midfoot arthritis noted bilaterally.  Hammertoe contractures noted with fibular deviation of the lesser toes.  Met adductus noted.  High arch cavus foot type noted.   Localized edema over [place] joint and TN joints.  Assessment/Plan of Care: 1. Cavus deformity of foot, acquired   2. Arthritis of right midfoot   3. Arthritis of left midfoot   4. Peroneal tendinitis of both lower legs      Meds ordered this encounter  Medications   meloxicam  (MOBIC ) 15 MG tablet    Sig: Take 1 tablet (15 mg total) by mouth daily.    Dispense:  30 tablet    Refill:  0   triamcinolone  acetonide (KENALOG ) 10 MG/ML injection 10 mg   None  Discussed clinical findings with patient today.  Radiographs reviewed with patient  Findings consistent with cavus foot morphology bilaterally with midfoot arthritis mostly affecting the TN and [place] joints.  Findings also consistent with peroneal tendinitis left foot.  Recommend use of good stiff  soled supportive shoe and use of good insert.  Discussed the etiology and treatment options for left foot peroneal tendinitis tendinitis.  We discussed that these types of injuries are overuse injuries and respond well to anti-inflammatories, rest, immobilization.  I reviewed RICE protocol with the patient.  Recommend the following treatment plan:  -Immobilization with Cam boot -Oral anti-inflammatories prescribed: Oral meloxicam  15 mg once a day -Recommended twice daily stretching through these exercises reviewed with the patient and an informational handout was given to the patient.   Verbal consent obtained to administer corticosteroid injection to the left peroneal tendon sheath at lateral hindfoot.  Alcohol skin prep.  Injected 1 cc of a one-to-one ratio of 2% lidocaine  plain 0.5% Marcaine  plain mixed with 10 mg Kenalog .  Band-Aid applied.  Patient tolerated well.  Follow-up in 3 to 4 weeks   Olina Melfi L. Lamount MAUL, AACFAS [facility]     [address] [place], [place] [address]                Office [phone]  Fax [phone]

## 2024-06-11 NOTE — Patient Instructions (Addendum)
 Peroneal Tendinopathy Rehab Ask your health care provider which exercises are safe for you. Do exercises exactly as told by your health care provider and adjust them as directed. It is normal to feel mild stretching, pulling, tightness, or discomfort as you do these exercises. Stop right away if you feel sudden pain or your pain gets worse. Do not begin these exercises until told by your health care provider. Stretching and range-of-motion exercises These exercises warm up your muscles and joints. They can help improve the movement and flexibility of your ankle. They may also help to relieve pain and stiffness. Gastrocnemius and soleus stretch, standing This is an exercise in which you stand on a step and use your body weight to stretch your calf muscles. To do this exercise: Stand on the edge of a step on the ball of your left / right foot. The ball of your foot is on the walking surface, right under your toes. Keep your other foot firmly on the same step. Hold on to the wall, a railing, or a chair for balance. Slowly lift your other foot, allowing your body weight to press your left / right heel down over the edge of the step. You should feel a stretch in your left / right calf (gastrocnemius and soleus). Hold this position for __________ seconds. Return both feet to the step. Repeat this exercise with a slight bend in your left / right knee. Repeat __________ times with your left / right knee straight and __________ times with your left / right knee bent. Complete this exercise __________ times a day. Strengthening exercises These exercises build strength and endurance in your foot and ankle. Endurance is the ability to use your muscles for a long time, even after they get tired. Ankle dorsiflexion with band  Secure a rubber exercise band or tube to an object, such as a table leg, that will not move when the band is pulled. Secure the other end of the band around your left / right foot. Sit on  the floor. Face the object with your left / right leg extended. The band or tube should be slightly tense when your foot is relaxed. Slowly flex your left / right ankle and toes to bring your foot toward you (dorsiflexion). Hold this position for __________ seconds. Let the band or tube slowly pull your foot back to the starting position. Repeat __________ times. Complete this exercise __________ times a day. Ankle eversion  Sit on the floor with your legs straight out in front of you. Loop a rubber exercise band or tube around the ball of your left / right foot. The ball of your foot is on the walking surface, right under your toes. Hold the ends of the band in your hands. You can also secure the band to a stable object. The band or tube should be slightly tense when your foot is relaxed. Slowly push your foot outward, away from your other leg (eversion). Hold this position for __________ seconds. Slowly return your foot to the starting position. Repeat __________ times. Complete this exercise __________ times a day. Plantar flexion, standing This exercise is sometimes called a standing heel raise. Stand with your feet shoulder-width apart. Place your hands on a wall or table to steady yourself as needed. Try not to use it for support. Keep your weight spread evenly over the width of your feet while you slowly rise up on your toes (plantar flexion). If told by your health care provider: Shift your weight  toward your left / right leg until you feel challenged. Stand on your left / right leg only. Hold this position for __________ seconds. Repeat __________ times. Complete this exercise __________ times a day. Single leg stand  Without shoes, stand near a railing or in a doorway. You may hold on to the railing or doorframe as needed. Stand on your left / right foot. Keep your big toe down on the floor and try to keep your arch lifted. Do not roll to the outside of your foot. If this  exercise is too easy, you can try it with your eyes closed or while standing on a pillow. Hold this position for __________ seconds. Repeat __________ times. Complete this exercise __________ times a day. This information is not intended to replace advice given to you by your health care provider. Make sure you discuss any questions you have with your health care provider. Document Revised: 03/29/2022 Document Reviewed: 03/29/2022 Elsevier Patient Education  2024 [employer]   Complete these rounds of stretching 2-3 times a day performing 3 sets of 10 to 15 repetitions.

## 2024-07-09 ENCOUNTER — Ambulatory Visit: Admitting: Podiatry

## 2024-07-09 ENCOUNTER — Encounter: Payer: Self-pay | Admitting: Podiatry

## 2024-07-09 DIAGNOSIS — G5762 Lesion of plantar nerve, left lower limb: Secondary | ICD-10-CM

## 2024-07-09 DIAGNOSIS — G5761 Lesion of plantar nerve, right lower limb: Secondary | ICD-10-CM | POA: Diagnosis not present

## 2024-07-09 MED ORDER — TRIAMCINOLONE ACETONIDE 10 MG/ML IJ SUSP
10.0000 mg | Freq: Once | INTRAMUSCULAR | Status: AC
  Administered 2024-07-09: 10 mg

## 2024-07-09 NOTE — Progress Notes (Signed)
 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 States it is 70% better, but the right foot is now up to an 8/10 pain. Last A1c was 7.9 in Mar, patient reported. No anti coag. She does use volteran gel.     HPI: 63 y.o. female follows up today for left hindfoot pain and peroneal tendinitis.  She reports good improvement with this.  Today she does complain of bilateral forefoot pain right greater than left.  Right foot is 8/10 pain.  Last A1c 7.9.  She does also report history of rheumatoid arthritis, tobacco use, has dealt with radiculopathy as well previously.  Past Medical History:  Diagnosis Date   Anxiety    Arthritis    Breast cancer (HCC) 02/22/2012   Right T1c, N0   C. difficile colitis    Colitis, collagenous 09/22/2015   Depression    Essential hypertension    Family history of brain cancer    Family history of esophageal cancer    Family history of lung cancer    Family history of lymphoma    Family history of pancreatic cancer    Fuchs' corneal dystrophy    GERD (gastroesophageal reflux disease)    Hypertension    Mitral valve prolapse    Mild mitral regurgitation   Mixed hyperlipidemia    OSA (obstructive sleep apnea)    Peripheral neuropathy    Seizures (HCC)    Only with dose of haldol; no more since that was stopped.    Past Surgical History:  Procedure Laterality Date   ABDOMINAL HYSTERECTOMY  1999   BACK SURGERY     BIOPSY N/A 09/28/2015   Procedure: BIOPSY;  Surgeon: Margo LITTIE Haddock, MD;  Location: [facility];  Service: Endoscopy;  Laterality: N/A;   BIOPSY  04/05/2016   Procedure: BIOPSY;  Surgeon: Margo LITTIE Haddock, MD;  Location: [facility];  Service: Endoscopy;;  colon bx   BREAST LUMPECTOMY  10/30/2002   right   BREAST RECONSTRUCTION WITH PLACEMENT OF TISSUE EXPANDER AND FLEX HD (ACELLULAR HYDRATED DERMIS) Right 09/07/2019   Procedure: RIGHT BREAST RECONSTRUCTION WITH  PLACEMENT OF TISSUE EXPANDER AND FLEX HD (ACELLULAR HYDRATED DERMIS);  Surgeon: Lowery Estefana RAMAN, DO;  Location: [facility];  Service: Plastics;  Laterality: Right;   CARDIAC CATHETERIZATION     Carpal tunnel R/L  1992   Cervical spine fusion X2 since last visit     COLONOSCOPY WITH PROPOFOL  N/A 09/28/2015   Procedure: COLONOSCOPY WITH PROPOFOL ;  Surgeon: Margo LITTIE Haddock, MD;  Location: [facility];  Service: Endoscopy;  Laterality: N/A;  procedure 1 cecum time in 1232  time out 1246   total time 14 mintes   COLONOSCOPY WITH PROPOFOL  N/A 04/05/2016   Procedure: COLONOSCOPY WITH PROPOFOL ;  Surgeon: Margo LITTIE Haddock, MD;  Location: [facility];  Service: Endoscopy;  Laterality: N/A;  1200   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   DEBRIDEMENT AND CLOSURE WOUND Right 02/17/2020   Procedure: Excision of right breast wound with closure and provena placement;  Surgeon: Lowery Estefana RAMAN, DO;  Location: [facility];  Service: Plastics;  Laterality: Right;   ELBOW SURGERY Right  tennis elbow release 2012   ESOPHAGOGASTRODUODENOSCOPY  10/26/2012   Procedure: ESOPHAGOGASTRODUODENOSCOPY (EGD);  Surgeon: Lamar JONETTA Aho, MD;  Location: [facility];  Service: Endoscopy;  Laterality: N/A;   ESOPHAGOGASTRODUODENOSCOPY (EGD) WITH PROPOFOL   N/A 09/28/2015   Procedure: ESOPHAGOGASTRODUODENOSCOPY (EGD) WITH PROPOFOL ;  Surgeon: Margo LITTIE Haddock, MD;  Location: [facility];  Service: Endoscopy;  Laterality: N/A;   EYE SURGERY  2018   corneal transplants bilateral   KNEE ARTHROSCOPY Left    LEFT HEART CATHETERIZATION WITH CORONARY ANGIOGRAM N/A 10/23/2012   Procedure: LEFT HEART CATHETERIZATION WITH CORONARY ANGIOGRAM;  Surgeon: Peter M Swaziland, MD;  Location: [facility] CATH LAB;  Service: Cardiovascular;  Laterality: N/A;   MASTECTOMY Right    MASTECTOMY W/ SENTINEL NODE BIOPSY Right 09/07/2019   Procedure: RIGHT MASTECTOMY WITH SENTINEL LYMPH NODE BIOPSY;  Surgeon: Curvin Deward MOULD, MD;  Location: [facility];  Service: General;   Laterality: Right;   OVARIAN CYST REMOVAL     prior to hysterectomy, right   Ray cage fusion  1997   REMOVAL OF TISSUE EXPANDER Right 12/31/2019   Procedure: REMOVAL OF TISSUE EXPANDER AND EXCESS SKIN;  Surgeon: Lowery Estefana RAMAN, DO;  Location: [facility];  Service: Plastics;  Laterality: Right;  1 hour   SENTINEL LYMPH NODE BIOPSY  10/30/2002   right   TONSILLECTOMY     at six months old    Allergies  Allergen Reactions   Bee Venom Anaphylaxis   Haloperidol Lactate Other (See Comments)    Convulsions    Meperidine Hcl Hives   Xifaxan  [Rifaximin ] Diarrhea and Other (See Comments)    Constantly felt the need to use the bathroom     ROS    Physical Exam: There were no vitals filed for this visit.  General: The patient is alert and oriented x3 in no acute distress.  Dermatology: Skin is warm, dry and supple bilateral lower extremities. Interspaces are clear of maceration and debris.    Vascular: Palpable pedal pulses bilaterally. Capillary refill within normal limits.  Localized edema to dorsal midfoot left greater than right  Neurological: Light touch sensation grossly intact bilateral feet.  Protective sensation threshold decreased  Musculoskeletal Exam: High arched foot type bilaterally.  No pain to the left foot peroneal tendons or with range of motion to the hindfoot today bilaterally.  Today there is tenderness on palpation of the dorsal forefoot over the second interspaces bilaterally.  Negative Mulder's click.  Does report shooting sensations going to the toes.  Radiographic Exam: Left and right 3 views weightbearing 06/11/2024 Significant midfoot arthritis noted bilaterally.  Hammertoe contractures noted with fibular deviation of the lesser toes.  Met adductus noted.  High arch cavus foot type noted.  Localized edema over [place] joint and [place] joints.  Assessment/Plan of Care: 1. Neuroma of second interspace of left foot   2. Neuroma of second interspace  of right foot      Meds ordered this encounter  Medications   triamcinolone  acetonide (KENALOG ) 10 MG/ML injection 10 mg   None  Discussed clinical findings with patient today.  Her left peroneal pain and hindfoot pain have improved significantly from previous.  New problem of bilateral forefoot pain.  Findings seem most consistent with neuroma of the bilateral second interspace.  Right greater than left.  Verbal consent obtained to administer corticosteroid injection to the bilateral right second interspace.  Alcohol skin prep.  Injected 0.5 cc of 0.5% Marcaine  plain mixed with 10 mg Kenalog  each to the left and right foot.  Band-Aid applied.  Patient tolerated this well.  Limit activity, utilize RICE therapy.  Metatarsal pads applied to the bilateral forefoot.  Continue to offload metatarsal heads until follow-up.  May utilize over-the-counter  diclofenac  gel.  She did report feeling drowsy with use of the meloxicam .  We will discontinue this.  She can take over-the-counter naproxen  twice a day for the next 1 to 2 weeks to 220 mg each dose.  If we get good progression, patient is interested in custom orthotics, could have metatarsal pad built in to offload the metatarsal heads.  Reevaluate in about 3 weeks.   Emmaly Leech L. Lamount MAUL, AACFAS [facility]     [address] [place], [place] [address]                Office [phone]  Fax [phone]

## 2024-07-09 NOTE — Patient Instructions (Signed)
 Look for metatarsal pads to offload the balls of your feet.  You can find these on [employer] or from [email or web address].  Use these at all times when on your feet.

## 2024-07-13 ENCOUNTER — Encounter: Payer: Self-pay | Admitting: Podiatry

## 2024-07-17 ENCOUNTER — Telehealth (HOSPITAL_COMMUNITY): Admitting: Psychiatry

## 2024-07-17 ENCOUNTER — Encounter (HOSPITAL_COMMUNITY): Payer: Self-pay | Admitting: Psychiatry

## 2024-07-17 DIAGNOSIS — F3341 Major depressive disorder, recurrent, in partial remission: Secondary | ICD-10-CM

## 2024-07-17 MED ORDER — ALPRAZOLAM 1 MG PO TABS: 1.0000 mg | ORAL_TABLET | Freq: Two times a day (BID) | ORAL | 2 refills | Status: DC

## 2024-07-17 MED ORDER — SERTRALINE HCL 50 MG PO TABS: 50.0000 mg | ORAL_TABLET | Freq: Every day | ORAL | 2 refills | Status: DC

## 2024-07-17 NOTE — Progress Notes (Signed)
 Virtual Visit via Video Note  I connected with Rachel Sutton on 07/17/24 at  9:20 AM EDT by a video enabled telemedicine application and verified that I am speaking with the correct person using two identifiers.  Location: Patient: home Provider: office   I discussed the limitations of evaluation and management by telemedicine and the availability of in person appointments. The patient expressed understanding and agreed to proceed.     I discussed the assessment and treatment plan with the patient. The patient was provided an opportunity to ask questions and all were answered. The patient agreed with the plan and demonstrated an understanding of the instructions.   The patient was advised to call back or seek an in-person evaluation if the symptoms worsen or if the condition fails to improve as anticipated.  I provided 20 minutes of non-face-to-face time during this encounter.   Barnie Gull, MD  [facility] MD/PA/NP OP Progress Note  07/17/2024 9:28 AM Rachel Sutton  MRN:  [id number]  Chief Complaint:  Chief Complaint  Patient presents with   Anxiety   Depression   Follow-up   HPI:  This patient is a 63 year old white female who lives in [place]  with her husband. She works as an Airline pilot for [employer].   The patient returns for follow-up after 3 months regarding her depression and anxiety.  She states overall things have been going well for her.  She is only getting about 5 hours of sleep because she has to get up so early to drive to work but she states she is used to it and does not feel tired.  She tries to catch up with naps on the weekend.  She is not experiencing any significant stressors right now.  She states that her mood has been stable and she denies significant depression anxiety thoughts of suicide or self-harm.  She does feel like her medications are still helpful. Visit Diagnosis:    ICD-10-CM   1. Recurrent major depressive disorder, in  partial remission (HCC)  F33.41       Past Psychiatric History: Long-term outpatient treatment  Past Medical History:  Past Medical History:  Diagnosis Date   Anxiety    Arthritis    Breast cancer (HCC) 02/22/2012   Right T1c, N0   C. difficile colitis    Colitis, collagenous 09/22/2015   Depression    Essential hypertension    Family history of brain cancer    Family history of esophageal cancer    Family history of lung cancer    Family history of lymphoma    Family history of pancreatic cancer    Fuchs' corneal dystrophy    GERD (gastroesophageal reflux disease)    Hypertension    Mitral valve prolapse    Mild mitral regurgitation   Mixed hyperlipidemia    OSA (obstructive sleep apnea)    Peripheral neuropathy    Seizures (HCC)    Only with dose of haldol; no more since that was stopped.    Past Surgical History:  Procedure Laterality Date   ABDOMINAL HYSTERECTOMY  1999   BACK SURGERY     BIOPSY N/A 09/28/2015   Procedure: BIOPSY;  Surgeon: Margo LITTIE Haddock, MD;  [facility];  Service: Endoscopy;  Laterality: N/A;   BIOPSY  04/05/2016   Procedure: BIOPSY;  Surgeon: Margo LITTIE Haddock, MD;  [facility];  Service: Endoscopy;;  colon bx   BREAST LUMPECTOMY  10/30/2002   right   BREAST RECONSTRUCTION WITH  PLACEMENT OF TISSUE EXPANDER AND FLEX HD (ACELLULAR HYDRATED DERMIS) Right 09/07/2019   Procedure: RIGHT BREAST RECONSTRUCTION WITH PLACEMENT OF TISSUE EXPANDER AND FLEX HD (ACELLULAR HYDRATED DERMIS);  Surgeon: Lowery Estefana RAMAN, DO;  [facility];  Service: Plastics;  Laterality: Right;   CARDIAC CATHETERIZATION     Carpal tunnel R/L  1992   Cervical spine fusion X2 since last visit     COLONOSCOPY WITH PROPOFOL  N/A 09/28/2015   Procedure: COLONOSCOPY WITH PROPOFOL ;  Surgeon: Margo LITTIE Haddock, MD;  [facility]: [facility];  Service: Endoscopy;  Laterality: N/A;  procedure 1 cecum time in 1232  time out 1246   total time 14 mintes   COLONOSCOPY WITH PROPOFOL   N/A 04/05/2016   Procedure: COLONOSCOPY WITH PROPOFOL ;  Surgeon: Margo LITTIE Haddock, MD;  [facility];  Service: Endoscopy;  Laterality: N/A;  1200   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   DEBRIDEMENT AND CLOSURE WOUND Right 02/17/2020   Procedure: Excision of right breast wound with closure and provena placement;  Surgeon: Lowery Estefana RAMAN, DO;  Location: [facility];  Service: Plastics;  Laterality: Right;   ELBOW SURGERY Right  tennis elbow release 2012   ESOPHAGOGASTRODUODENOSCOPY  10/26/2012   Procedure: ESOPHAGOGASTRODUODENOSCOPY (EGD);  Surgeon: Lamar JONETTA Aho, MD;  Location: [facility] ENDOSCOPY;  Service: Endoscopy;  Laterality: N/A;   ESOPHAGOGASTRODUODENOSCOPY (EGD) WITH PROPOFOL  N/A 09/28/2015   Procedure: ESOPHAGOGASTRODUODENOSCOPY (EGD) WITH PROPOFOL ;  Surgeon: Margo LITTIE Haddock, MD;  [facility];  Service: Endoscopy;  Laterality: N/A;   EYE SURGERY  2018   corneal transplants bilateral   KNEE ARTHROSCOPY Left    LEFT HEART CATHETERIZATION WITH CORONARY ANGIOGRAM N/A 10/23/2012   Procedure: LEFT HEART CATHETERIZATION WITH CORONARY ANGIOGRAM;  Surgeon: Peter M Swaziland, MD;  Location: [facility] CATH LAB;  Service: Cardiovascular;  Laterality: N/A;   MASTECTOMY Right    MASTECTOMY W/ SENTINEL NODE BIOPSY Right 09/07/2019   Procedure: RIGHT MASTECTOMY WITH SENTINEL LYMPH NODE BIOPSY;  Surgeon: Curvin Deward MOULD, MD;  [facility];  Service: General;  Laterality: Right;   OVARIAN CYST REMOVAL     prior to hysterectomy, right   Ray cage fusion  1997   REMOVAL OF TISSUE EXPANDER Right 12/31/2019   Procedure: REMOVAL OF TISSUE EXPANDER AND EXCESS SKIN;  Surgeon: Lowery Estefana RAMAN, DO;  Location: [facility];  Service: Plastics;  Laterality: Right;  1 hour   SENTINEL LYMPH NODE BIOPSY  10/30/2002   right   TONSILLECTOMY     at six months old    Family Psychiatric History: See below  Family History:  Family History  Problem Relation Age of Onset    Diabetes Mother    Pancreatic cancer Mother 6   Lung cancer Father    Diabetes Father    Heart disease Father    Esophageal cancer Father 13       Beer drinker   Schizophrenia Sister    Cancer Maternal Uncle        unknown form of cancer; cigar smoker   Lung cancer Maternal Grandmother    Brain cancer Maternal Grandfather    Heart disease Paternal Grandfather    Brain cancer Cousin        dx in his late 104s-40s   Brain cancer Brother 43   Lymphoma Niece 28   Anesthesia problems Neg Hx    Hypotension Neg Hx    Malignant hyperthermia Neg Hx    Pseudochol deficiency Neg Hx  Colon cancer Neg Hx    Colon polyps Neg Hx    Crohn's disease Neg Hx    Ulcerative colitis Neg Hx    Breast cancer Neg Hx     Social History:  Social History   Socioeconomic History   Marital status: Married    Spouse name: Not on file   Number of children: 2   Years of education: Not on file   Highest education level: Not on file  Occupational History   Occupation: Agricultural engineer: [employer]    Comment: [employer]  Tobacco Use   Smoking status: Every Day    Current packs/day: 0.00    Average packs/day: 0.3 packs/day for 45.0 years (13.5 ttl pk-yrs)    Types: Cigarettes    Start date: 07/07/1974    Last attempt to quit: 07/14/2019    Years since quitting: 5.0   Smokeless tobacco: Never  Vaping Use   Vaping status: Never Used  Substance and Sexual Activity   Alcohol use: Not Currently    Alcohol/week: 0.0 standard drinks of alcohol    Comment: stopped July 26, 2014)   Drug use: No   Sexual activity: Not Currently    Birth control/protection: Surgical  Other Topics Concern   Not on file  Social History Narrative   Not on file   Social Drivers of Health   Financial Resource Strain: Not on file  Food Insecurity: Not on file  Transportation Needs: Not on file  Physical Activity: Not on file  Stress: Not on file  Social Connections: Not on file    Allergies:  Allergies   Allergen Reactions   Bee Venom Anaphylaxis   Haloperidol Lactate Other (See Comments)    Convulsions    Meperidine Hcl Hives   Xifaxan  [Rifaximin ] Diarrhea and Other (See Comments)    Constantly felt the need to use the bathroom     Metabolic Disorder Labs: No results found for: HGBA1C, MPG No results found for: PROLACTIN Lab Results  Component Value Date   CHOL 207 (H) 10/23/2012   TRIG 319 (H) 10/23/2012   HDL 33 (L) 10/23/2012   CHOLHDL 6.3 10/23/2012   VLDL 64 (H) 10/23/2012   LDLCALC 110 (H) 10/23/2012   Lab Results  Component Value Date   TSH 0.706 09/22/2015    Therapeutic Level Labs: No results found for: LITHIUM No results found for: VALPROATE No results found for: CBMZ  Current Medications: Current Outpatient Medications  Medication Sig Dispense Refill   acetaminophen  (TYLENOL ) 500 MG tablet Take 1,000 mg by mouth every 6 (six) hours as needed for moderate pain or headache.     ALPRAZolam  (XANAX ) 1 MG tablet Take 1 tablet (1 mg total) by mouth 2 (two) times daily. 180 tablet 2   Calcium-Magnesium-Vitamin D  (CALCIUM 500 PO) Take 1 tablet by mouth daily.      Dexlansoprazole 30 MG capsule DR Take 1 capsule by mouth daily.     diclofenac  sodium (VOLTAREN ) 1 % GEL Apply 1 application topically 3 (three) times daily as needed (pain).      EPINEPHrine  (EPI-PEN) 0.3 mg/0.3 mL DEVI Inject 0.3 mg into the muscle as needed (bee stings).      fish oil-omega-3 fatty acids 1000 MG capsule Take 1 g by mouth every morning.      gabapentin  (NEURONTIN ) 300 MG capsule Take 300 mg by mouth 3 (three) times daily.     hydroxychloroquine  (PLAQUENIL ) 200 MG tablet Take 2 tablets (400 mg total) by mouth  daily. 180 tablet 2   leflunomide  (ARAVA ) 20 MG tablet Take 20 mg by mouth daily.     losartan -hydrochlorothiazide  (HYZAAR) 50-12.5 MG tablet Take 1 tablet by mouth daily. 90 tablet 3   meloxicam  (MOBIC ) 15 MG tablet Take 1 tablet (15 mg total) by mouth daily. 30 tablet 0    metFORMIN (GLUCOPHAGE) 500 MG tablet Take 500 mg by mouth 2 (two) times daily.     metoprolol  succinate (TOPROL -XL) 50 MG 24 hr tablet TAKE 1 TABLET TWICE A DAY  WITH OR IMMEDIATELY        FOLLOWING A MEAL. 180 tablet 2   Multiple Vitamin (MULITIVITAMIN WITH MINERALS) TABS Take 1 tablet by mouth every morning.     NIFEdipine  (PROCARDIA  XL/NIFEDICAL-XL) 90 MG 24 hr tablet Take 1 tablet (90 mg total) by mouth daily. 90 tablet 2   nitroGLYCERIN  (NITROSTAT ) 0.4 MG SL tablet Place 1 tablet (0.4 mg total) under the tongue every 5 (five) minutes x 3 doses as needed for chest pain. 25 tablet 3   pantoprazole  (PROTONIX ) 40 MG tablet Take 1 tablet (40 mg total) by mouth daily. Please schedule an appointment for further refills. 90 tablet 2   pioglitazone (ACTOS) 30 MG tablet Take 30 mg by mouth daily.     pravastatin  (PRAVACHOL ) 20 MG tablet Take 1 tablet (20 mg total) by mouth daily. 90 tablet 2   predniSONE  (DELTASONE ) 10 MG tablet Take 10 mg by mouth. As needed for gout flare up     sertraline  (ZOLOFT ) 50 MG tablet Take 1 tablet (50 mg total) by mouth daily. 90 tablet 2   sulfaSALAzine (AZULFIDINE) 500 MG tablet Take 500 mg by mouth daily.     tamoxifen  (NOLVADEX ) 20 MG tablet TAKE 1 TABLET DAILY 90 tablet 1   Current Facility-Administered Medications  Medication Dose Route Frequency Provider Last Rate Last Admin   betamethasone  acetate-betamethasone  sodium phosphate  (CELESTONE ) injection 3 mg  3 mg Intramuscular Once Janit Thresa HERO, DPM         Musculoskeletal: Strength & Muscle Tone: within normal limits Gait & Station: normal Patient leans: N/A  Psychiatric Specialty Exam: Review of Systems  Musculoskeletal:  Positive for arthralgias.  All other systems reviewed and are negative.   There were no vitals taken for this visit.There is no height or weight on file to calculate BMI.  General Appearance: Casual and Fairly Groomed  Eye Contact:  Good  Speech:  Clear and Coherent  Volume:   Normal  Mood:  Euthymic  Affect:  Congruent  Thought Process:  Goal Directed  Orientation:  Full (Time, Place, and Person)  Thought Content: WDL   Suicidal Thoughts:  No  Homicidal Thoughts:  No  Memory:  Immediate;   Good Recent;   Good Remote;   NA  Judgement:  Good  Insight:  Good  Psychomotor Activity:  Normal  Concentration:  Concentration: Good and Attention Span: Good  Recall:  Good  Fund of Knowledge: Good  Language: Good  Akathisia:  No  Handed:  Right  AIMS (if indicated): not done  Assets:  Communication Skills Desire for Improvement Physical Health Resilience Social Support Talents/Skills  ADL's:  Intact  Cognition: WNL  Sleep:  Good   Screenings: GAD-7    Flowsheet Row Office Visit from 07/11/2022 in [facility] at [place]  Total GAD-7 Score 3   PHQ2-9    Flowsheet Row Office Visit from 07/11/2022 in [facility] at [place] Video Visit from  06/07/2022 in [facility] at [facility] Video Visit from 12/07/2021 in [facility] at [place] Video Visit from 09/07/2021 in [facility] at [place] Video Visit from 08/10/2021 in [facility] at [facility] Total Score 1 0 0 0 1  PHQ-9 Total Score 7 -- -- -- --   Flowsheet Row Office Visit from 07/11/2022 in [facility] at [place] Video Visit from 06/07/2022 in [facility] at [place] Video Visit from 12/07/2021 in [facility] at [place]  C-SSRS RISK CATEGORY No Risk No Risk No Risk     Assessment and Plan: This patient is a 63 year old female with a history of depression and anxiety.  She is doing well on her current regimen.  She will continue Zoloft  50 mg daily for depression and anxiety and Xanax  1 mg twice daily for anxiety.  She will return  to see me in 4 months  Collaboration of Care: Collaboration of Care: Primary Care Provider AEB notes to be shared with PCP at patient's request  Patient/Guardian was advised Release of Information must be obtained prior to any record release in order to collaborate their care with an outside provider. Patient/Guardian was advised if they have not already done so to contact the registration department to sign all necessary forms in order for us  to release information regarding their care.   Consent: Patient/Guardian gives verbal consent for treatment and assignment of benefits for services provided during this visit. Patient/Guardian expressed understanding and agreed to proceed.    Barnie Gull, MD 07/17/2024, 9:28 AM

## 2024-07-21 ENCOUNTER — Telehealth: Payer: Self-pay | Admitting: Hematology

## 2024-07-21 NOTE — Telephone Encounter (Signed)
 Canceled appointment per patients request via incoming call. Patient kept her labs and her follow up with Dr.Feng. Her zometa  infusion was canceled.

## 2024-08-06 ENCOUNTER — Encounter: Payer: Self-pay | Admitting: Podiatry

## 2024-08-06 ENCOUNTER — Ambulatory Visit (INDEPENDENT_AMBULATORY_CARE_PROVIDER_SITE_OTHER): Admitting: Podiatry

## 2024-08-06 VITALS — Ht 63.5 in | Wt 194.0 lb

## 2024-08-06 DIAGNOSIS — M7742 Metatarsalgia, left foot: Secondary | ICD-10-CM

## 2024-08-06 DIAGNOSIS — M19072 Primary osteoarthritis, left ankle and foot: Secondary | ICD-10-CM

## 2024-08-06 DIAGNOSIS — M7741 Metatarsalgia, right foot: Secondary | ICD-10-CM

## 2024-08-06 DIAGNOSIS — M216X9 Other acquired deformities of unspecified foot: Secondary | ICD-10-CM

## 2024-08-06 DIAGNOSIS — M19071 Primary osteoarthritis, right ankle and foot: Secondary | ICD-10-CM

## 2024-08-06 NOTE — Progress Notes (Signed)
 Chief Complaint  Patient presents with   Neuroma    3 wk follow up for bilateral neuroma.  patient is interested in custom orthotics, could have metatarsal pad built in to offload the metatarsal heads. Diabetic A1c 6.9.  No anti coag    HPI: 63 y.o. female follows up today for bilateral forefoot and neuroma pain.  Left foot is doing very well, still reports some soreness to the right foot second interspace area.  Also previously was dealing with some peroneal tendinitis and left hindfoot pain which is doing much better as well.  She is wanting to discuss orthotics today.  Past Medical History:  Diagnosis Date   Anxiety    Arthritis    Breast cancer (HCC) 02/22/2012   Right T1c, N0   C. difficile colitis    Colitis, collagenous 09/22/2015   Depression    Essential hypertension    Family history of brain cancer    Family history of esophageal cancer    Family history of lung cancer    Family history of lymphoma    Family history of pancreatic cancer    Fuchs' corneal dystrophy    GERD (gastroesophageal reflux disease)    Hypertension    Mitral valve prolapse    Mild mitral regurgitation   Mixed hyperlipidemia    OSA (obstructive sleep apnea)    Peripheral neuropathy    Seizures (HCC)    Only with dose of haldol; no more since that was stopped.    Past Surgical History:  Procedure Laterality Date   ABDOMINAL HYSTERECTOMY  1999   BACK SURGERY     BIOPSY N/A 09/28/2015   Procedure: BIOPSY;  Surgeon: Margo LITTIE Haddock, MD;  [facility];  Service: Endoscopy;  Laterality: N/A;   BIOPSY  04/05/2016   Procedure: BIOPSY;  Surgeon: Margo LITTIE Haddock, MD;  [facility];  Service: Endoscopy;;  colon bx   BREAST LUMPECTOMY  10/30/2002   right   BREAST RECONSTRUCTION WITH PLACEMENT OF TISSUE EXPANDER AND FLEX HD (ACELLULAR HYDRATED DERMIS) Right 09/07/2019   Procedure: RIGHT BREAST RECONSTRUCTION WITH PLACEMENT OF TISSUE EXPANDER AND FLEX HD (ACELLULAR HYDRATED  DERMIS);  Surgeon: Lowery Estefana RAMAN, DO;  [facility];  Service: Plastics;  Laterality: Right;   CARDIAC CATHETERIZATION     Carpal tunnel R/L  1992   Cervical spine fusion X2 since last visit     COLONOSCOPY WITH PROPOFOL  N/A 09/28/2015   Procedure: COLONOSCOPY WITH PROPOFOL ;  Surgeon: Margo LITTIE Haddock, MD;  [facility];  Service: Endoscopy;  Laterality: N/A;  procedure 1 cecum time in 1232  time out 1246   total time 14 mintes   COLONOSCOPY WITH PROPOFOL  N/A 04/05/2016   Procedure: COLONOSCOPY WITH PROPOFOL ;  Surgeon: Margo LITTIE Haddock, MD;  [facility];  Service: Endoscopy;  Laterality: N/A;  1200   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   DEBRIDEMENT AND CLOSURE WOUND Right 02/17/2020   Procedure: Excision of right breast wound with closure and provena placement;  Surgeon: Lowery Estefana RAMAN, DO;  Location: [facility];  Service: Plastics;  Laterality: Right;   ELBOW SURGERY Right  tennis elbow release 2012   ESOPHAGOGASTRODUODENOSCOPY  10/26/2012   Procedure: ESOPHAGOGASTRODUODENOSCOPY (EGD);  Surgeon: Lamar JONETTA Aho, MD;  Location: [facility] ENDOSCOPY;  Service: Endoscopy;  Laterality: N/A;   ESOPHAGOGASTRODUODENOSCOPY (EGD) WITH PROPOFOL  N/A 09/28/2015   Procedure: ESOPHAGOGASTRODUODENOSCOPY (EGD) WITH PROPOFOL ;  Surgeon: Margo LITTIE Haddock, MD;  [facility];  Service: Endoscopy;  Laterality: N/A;   EYE SURGERY  2018   corneal transplants bilateral   KNEE ARTHROSCOPY Left    LEFT HEART CATHETERIZATION WITH CORONARY ANGIOGRAM N/A 10/23/2012   Procedure: LEFT HEART CATHETERIZATION WITH CORONARY ANGIOGRAM;  Surgeon: Peter M Swaziland, MD;  Location: [facility] CATH LAB;  Service: Cardiovascular;  Laterality: N/A;   MASTECTOMY Right    MASTECTOMY W/ SENTINEL NODE BIOPSY Right 09/07/2019   Procedure: RIGHT MASTECTOMY WITH SENTINEL LYMPH NODE BIOPSY;  Surgeon: Curvin Deward MOULD, MD;  [facility];  Service: General;  Laterality: Right;   OVARIAN CYST REMOVAL     prior to  hysterectomy, right   Ray cage fusion  1997   REMOVAL OF TISSUE EXPANDER Right 12/31/2019   Procedure: REMOVAL OF TISSUE EXPANDER AND EXCESS SKIN;  Surgeon: Lowery Estefana RAMAN, DO;  Location: [facility];  Service: Plastics;  Laterality: Right;  1 hour   SENTINEL LYMPH NODE BIOPSY  10/30/2002   right   TONSILLECTOMY     at six months old    Allergies  Allergen Reactions   Bee Venom Anaphylaxis   Haloperidol Lactate Other (See Comments)    Convulsions    Meperidine Hcl Hives   Xifaxan  [Rifaximin ] Diarrhea and Other (See Comments)    Constantly felt the need to use the bathroom     ROS denies any nausea, vomit, fever, chills, chest pain, shortness of breath   Physical Exam: There were no vitals filed for this visit.  General: The patient is alert and oriented x3 in no acute distress.  Dermatology: Skin is warm, dry and supple bilateral lower extremities. Interspaces are clear of maceration and debris.    Vascular: Palpable pedal pulses bilaterally. Capillary refill within normal limits.  Localized edema to dorsal midfoot left greater than right  Neurological: Light touch sensation grossly intact bilateral feet.  Protective sensation threshold decreased  Musculoskeletal Exam: High arched foot type bilaterally.  No pain to the left foot peroneal tendons or with range of motion to the hindfoot today bilaterally.  Today there is tenderness on palpation of the dorsal forefoot over the right second interspaces bilaterally.  Left foot improved.  Negative Mulder's click.   Radiographic Exam: Left and right 3 views weightbearing 06/11/2024 Significant midfoot arthritis noted bilaterally.  Hammertoe contractures noted with fibular deviation of the lesser toes.  Met adductus noted.  High arch cavus foot type noted.  Localized edema over Clifton joint and TN joints.  Assessment/Plan of Care: 1. Cavus deformity of foot, acquired   2. Metatarsalgia of both feet   3. Arthritis of  right midfoot   4. Arthritis of left midfoot      No orders of the defined types were placed in this encounter.  FOR HOME USE ONLY DME CUSTOM ORTHOTICS  Discussed clinical findings with patient today.  Has had good improvement to the left second interspace neuroma pain following injection.  Right side seems that she likely had a steroid flare.  At this point she is interested in custom orthotics.  I do think that this would give her some benefit due to her prominent metatarsal heads and cavus foot morphology bilaterally.  Prescription written for custom orthotics to address this with metatarsal pads built in.  Continue home supportive care in the interim.  Follow-up as needed symptoms recur or worsen  Suha Schoenbeck L. Lamount MAUL, AACFAS [facility]     [address]  [place], [place] [address]                Office [phone]  Fax [phone]

## 2024-08-08 ENCOUNTER — Other Ambulatory Visit: Payer: Self-pay | Admitting: Medical Genetics

## 2024-08-18 ENCOUNTER — Other Ambulatory Visit

## 2024-09-02 ENCOUNTER — Ambulatory Visit

## 2024-09-02 NOTE — Progress Notes (Signed)
 Orthotics   Patient was present and evaluated for Custom molded foot orthotics. Patient will benefit from CFO's to provide total contact to BIL MLA's helping to balance and distribute body weight more evenly across BIL feet helping to reduce plantar pressure and pain. Orthotic will also encourage FF / RF alignment  Patient was scanned today and will return for fitting upon receipt   UMR $400 ded is met / $2171.69 met of $3500 Oop max patient may be billed for 20% coins and is aware    Lolita Schultze CPed, CFo, CFm

## 2024-09-21 ENCOUNTER — Telehealth: Payer: Self-pay

## 2024-09-21 NOTE — Telephone Encounter (Signed)
 LVM to schedule orthotic fitting/ pu  Charges not entered  Financial form signed

## 2024-09-24 ENCOUNTER — Ambulatory Visit (INDEPENDENT_AMBULATORY_CARE_PROVIDER_SITE_OTHER)

## 2024-09-24 DIAGNOSIS — M7741 Metatarsalgia, right foot: Secondary | ICD-10-CM

## 2024-09-24 DIAGNOSIS — M7671 Peroneal tendinitis, right leg: Secondary | ICD-10-CM | POA: Diagnosis not present

## 2024-09-24 DIAGNOSIS — M216X9 Other acquired deformities of unspecified foot: Secondary | ICD-10-CM

## 2024-09-24 DIAGNOSIS — M19072 Primary osteoarthritis, left ankle and foot: Secondary | ICD-10-CM | POA: Diagnosis not present

## 2024-09-24 DIAGNOSIS — G5761 Lesion of plantar nerve, right lower limb: Secondary | ICD-10-CM

## 2024-09-24 DIAGNOSIS — M19071 Primary osteoarthritis, right ankle and foot: Secondary | ICD-10-CM

## 2024-09-24 DIAGNOSIS — M7672 Peroneal tendinitis, left leg: Secondary | ICD-10-CM | POA: Diagnosis not present

## 2024-09-24 DIAGNOSIS — G5762 Lesion of plantar nerve, left lower limb: Secondary | ICD-10-CM

## 2024-09-24 NOTE — Progress Notes (Signed)
 Patient presents today to pick up custom molded foot orthotics, diagnosed with Cavus Foot deformity by Dr. Lamount.   Orthotics were dispensed and fit was satisfactory. Reviewed instructions for break-in and wear. Written instructions given to patient.  Patient will follow up as needed.   Lolita Schultze Cped, CFo, CFm

## 2024-10-15 ENCOUNTER — Other Ambulatory Visit

## 2024-10-19 ENCOUNTER — Other Ambulatory Visit: Payer: Self-pay | Admitting: Nurse Practitioner

## 2024-10-26 ENCOUNTER — Other Ambulatory Visit: Payer: Self-pay | Admitting: Medical Genetics

## 2024-10-26 DIAGNOSIS — Z006 Encounter for examination for normal comparison and control in clinical research program: Secondary | ICD-10-CM

## 2024-11-16 ENCOUNTER — Telehealth (HOSPITAL_COMMUNITY): Admitting: Psychiatry

## 2024-11-17 ENCOUNTER — Encounter (HOSPITAL_COMMUNITY): Payer: Self-pay | Admitting: Psychiatry

## 2024-11-17 ENCOUNTER — Telehealth (INDEPENDENT_AMBULATORY_CARE_PROVIDER_SITE_OTHER): Admitting: Psychiatry

## 2024-11-17 DIAGNOSIS — F3341 Major depressive disorder, recurrent, in partial remission: Secondary | ICD-10-CM

## 2024-11-17 MED ORDER — SERTRALINE HCL 50 MG PO TABS: 50.0000 mg | ORAL_TABLET | Freq: Every day | ORAL | 2 refills | Status: AC

## 2024-11-17 MED ORDER — ALPRAZOLAM 1 MG PO TABS: 1.0000 mg | ORAL_TABLET | Freq: Two times a day (BID) | ORAL | 2 refills | Status: AC

## 2024-11-17 NOTE — Progress Notes (Signed)
 Virtual Visit via Video Note  I connected with Rachel Sutton on 11/17/24 at  9:00 AM EST by a video enabled telemedicine application and verified that I am speaking with the correct person using two identifiers.  Location: Patient: home Provider: office   I discussed the limitations of evaluation and management by telemedicine and the availability of in person appointments. The patient expressed understanding and agreed to proceed.      I discussed the assessment and treatment plan with the patient. The patient was provided an opportunity to ask questions and all were answered. The patient agreed with the plan and demonstrated an understanding of the instructions.   The patient was advised to call back or seek an in-person evaluation if the symptoms worsen or if the condition fails to improve as anticipated.  I provided 20 minutes of non-face-to-face time during this encounter.   Barnie Gull, MD  [facility] MD/PA/NP OP Progress Note  11/17/2024 9:16 AM Rachel Sutton  MRN:  [id number]  Chief Complaint:  Chief Complaint  Patient presents with   Depression   Anxiety   Follow-up   HPI: This patient is a 63 year old white female who lives in [place]  with her husband. She works as an airline pilot for [employer].   The patient returns for follow-up after 6 months regarding her major depressive disorder and generalized anxiety.  She states overall she is doing well.  She has been placed on Ozempic for her diabetes and her A1c has come down to 5.5.  She has also lost 20 pounds.  She has also been diagnosed with sleep apnea and now has a CPAP and she is sleeping much better.  The patient states her mood is stable and she denies significant depression anxiety symptoms or thoughts of self-harm.  She still feels that her medications are very helpful Visit Diagnosis:    ICD-10-CM   1. Recurrent major depressive disorder, in partial remission  F33.41       Past Psychiatric  History: Long-term outpatient treatment  Past Medical History:  Past Medical History:  Diagnosis Date   Anxiety    Arthritis    Breast cancer (HCC) 02/22/2012   Right T1c, N0   C. difficile colitis    Colitis, collagenous 09/22/2015   Depression    Essential hypertension    Family history of brain cancer    Family history of esophageal cancer    Family history of lung cancer    Family history of lymphoma    Family history of pancreatic cancer    Fuchs' corneal dystrophy    GERD (gastroesophageal reflux disease)    Hypertension    Mitral valve prolapse    Mild mitral regurgitation   Mixed hyperlipidemia    OSA (obstructive sleep apnea)    Peripheral neuropathy    Seizures (HCC)    Only with dose of haldol; no more since that was stopped.    Past Surgical History:  Procedure Laterality Date   ABDOMINAL HYSTERECTOMY  1999   BACK SURGERY     BIOPSY N/A 09/28/2015   Procedure: BIOPSY;  Surgeon: Margo LITTIE Haddock, MD;  Location: [facility];  Service: Endoscopy;  Laterality: N/A;   BIOPSY  04/05/2016   Procedure: BIOPSY;  Surgeon: Margo LITTIE Haddock, MD;  Location: [facility];  Service: Endoscopy;;  colon bx   BREAST LUMPECTOMY  10/30/2002   right   BREAST RECONSTRUCTION WITH PLACEMENT OF TISSUE EXPANDER AND FLEX HD (ACELLULAR HYDRATED DERMIS) Right 09/07/2019  Procedure: RIGHT BREAST RECONSTRUCTION WITH PLACEMENT OF TISSUE EXPANDER AND FLEX HD (ACELLULAR HYDRATED DERMIS);  Surgeon: Lowery Estefana RAMAN, DO;  Location: [facility];  Service: Plastics;  Laterality: Right;   CARDIAC CATHETERIZATION     Carpal tunnel R/L  1992   Cervical spine fusion X2 since last visit     COLONOSCOPY WITH PROPOFOL  N/A 09/28/2015   Procedure: COLONOSCOPY WITH PROPOFOL ;  Surgeon: Margo LITTIE Haddock, MD;  Location: [facility];  Service: Endoscopy;  Laterality: N/A;  procedure 1 cecum time in 1232  time out 1246   total time 14 mintes   COLONOSCOPY WITH PROPOFOL  N/A 04/05/2016   Procedure: COLONOSCOPY WITH PROPOFOL ;   Surgeon: Margo LITTIE Haddock, MD;  [facility]: [facility];  Service: Endoscopy;  Laterality: N/A;  1200   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   DEBRIDEMENT AND CLOSURE WOUND Right 02/17/2020   Procedure: Excision of right breast wound with closure and provena placement;  Surgeon: Lowery Estefana RAMAN, DO;  Location: [facility];  Service: Plastics;  Laterality: Right;   ELBOW SURGERY Right  tennis elbow release 2012   ESOPHAGOGASTRODUODENOSCOPY  10/26/2012   Procedure: ESOPHAGOGASTRODUODENOSCOPY (EGD);  Surgeon: Lamar JONETTA Aho, MD;  Location[facility] ENDOSCOPY;  Service: Endoscopy;  Laterality: N/A;   ESOPHAGOGASTRODUODENOSCOPY (EGD) WITH PROPOFOL  N/A 09/28/2015   Procedure: ESOPHAGOGASTRODUODENOSCOPY (EGD) WITH PROPOFOL ;  Surgeon: Margo LITTIE Haddock, MD;  Location: [facility];  Service: Endoscopy;  Laterality: N/A;   EYE SURGERY  2018   corneal transplants bilateral   KNEE ARTHROSCOPY Left    LEFT HEART CATHETERIZATION WITH CORONARY ANGIOGRAM N/A 10/23/2012   Procedure: LEFT HEART CATHETERIZATION WITH CORONARY ANGIOGRAM;  Surgeon: Peter M Jordan, MD;  Location: [facility] LAB;  Service: Cardiovascular;  Laterality: N/A;   MASTECTOMY Right    MASTECTOMY W/ SENTINEL NODE BIOPSY Right 09/07/2019   Procedure: RIGHT MASTECTOMY WITH SENTINEL LYMPH NODE BIOPSY;  Surgeon: Curvin Deward MOULD, MD;  Location: [facility];  Service: General;  Laterality: Right;   OVARIAN CYST REMOVAL     prior to hysterectomy, right   Ray cage fusion  1997   REMOVAL OF TISSUE EXPANDER Right 12/31/2019   Procedure: REMOVAL OF TISSUE EXPANDER AND EXCESS SKIN;  Surgeon: Lowery Estefana RAMAN, DO;  Location:  SURGERY CENTER;  Service: Plastics;  Laterality: Right;  1 hour   SENTINEL LYMPH NODE BIOPSY  10/30/2002   right   TONSILLECTOMY     at six months old    Family Psychiatric History: See below  Family History:  Family History  Problem Relation Age of Onset   Diabetes Mother    Pancreatic cancer Mother 35   Lung  cancer Father    Diabetes Father    Heart disease Father    Esophageal cancer Father 64       Beer drinker   Schizophrenia Sister    Cancer Maternal Uncle        unknown form of cancer; cigar smoker   Lung cancer Maternal Grandmother    Brain cancer Maternal Grandfather    Heart disease Paternal Grandfather    Brain cancer Cousin        dx in his late 18s-40s   Brain cancer Brother 49   Lymphoma Niece 52   Anesthesia problems Neg Hx    Hypotension Neg Hx    Malignant hyperthermia Neg Hx    Pseudochol deficiency Neg Hx    Colon cancer Neg Hx    Colon polyps Neg Hx  Crohn's disease Neg Hx    Ulcerative colitis Neg Hx    Breast cancer Neg Hx     Social History:  Social History   Socioeconomic History   Marital status: Married    Spouse name: Not on file   Number of children: 2   Years of education: Not on file   Highest education level: Not on file  Occupational History   Occupation: Agricultural Engineer: [employer]    Comment: [employer]  Tobacco Use   Smoking status: Every Day    Current packs/day: 0.00    Average packs/day: 0.3 packs/day for 45.0 years (13.5 ttl pk-yrs)    Types: Cigarettes    Start date: 07/07/1974    Last attempt to quit: 07/14/2019    Years since quitting: 5.3   Smokeless tobacco: Never  Vaping Use   Vaping status: Never Used  Substance and Sexual Activity   Alcohol use: Not Currently    Alcohol/week: 0.0 standard drinks of alcohol    Comment: stopped July 26, 2014)   Drug use: No   Sexual activity: Not Currently    Birth control/protection: Surgical  Other Topics Concern   Not on file  Social History Narrative   Not on file   Social Drivers of Health   Financial Resource Strain: Not on file  Food Insecurity: Not on file  Transportation Needs: Not on file  Physical Activity: Not on file  Stress: Not on file  Social Connections: Not on file    Allergies:  Allergies  Allergen Reactions   Bee Venom Anaphylaxis   Haloperidol  Lactate Other (See Comments)    Convulsions    Meperidine Hcl Hives   Xifaxan  [Rifaximin ] Diarrhea and Other (See Comments)    Constantly felt the need to use the bathroom     Metabolic Disorder Labs: No results found for: HGBA1C, MPG No results found for: PROLACTIN Lab Results  Component Value Date   CHOL 207 (H) 10/23/2012   TRIG 319 (H) 10/23/2012   HDL 33 (L) 10/23/2012   CHOLHDL 6.3 10/23/2012   VLDL 64 (H) 10/23/2012   LDLCALC 110 (H) 10/23/2012   Lab Results  Component Value Date   TSH 0.706 09/22/2015    Therapeutic Level Labs: No results found for: LITHIUM No results found for: VALPROATE No results found for: CBMZ  Current Medications: Current Outpatient Medications  Medication Sig Dispense Refill   OZEMPIC, 0.25 OR 0.5 MG/DOSE, 2 MG/3ML SOPN Inject 0.5 mg into the skin once a week.     acetaminophen  (TYLENOL ) 500 MG tablet Take 1,000 mg by mouth every 6 (six) hours as needed for moderate pain or headache.     ALPRAZolam  (XANAX ) 1 MG tablet Take 1 tablet (1 mg total) by mouth 2 (two) times daily. 180 tablet 2   Calcium-Magnesium-Vitamin D  (CALCIUM 500 PO) Take 1 tablet by mouth daily.      Dexlansoprazole 30 MG capsule DR Take 1 capsule by mouth daily.     diclofenac  sodium (VOLTAREN ) 1 % GEL Apply 1 application topically 3 (three) times daily as needed (pain).      EPINEPHrine  (EPI-PEN) 0.3 mg/0.3 mL DEVI Inject 0.3 mg into the muscle as needed (bee stings).      fish oil-omega-3 fatty acids 1000 MG capsule Take 1 g by mouth every morning.      gabapentin  (NEURONTIN ) 300 MG capsule Take 300 mg by mouth 3 (three) times daily.     hydroxychloroquine  (PLAQUENIL ) 200 MG tablet Take  2 tablets (400 mg total) by mouth daily. 180 tablet 2   leflunomide  (ARAVA ) 20 MG tablet Take 20 mg by mouth daily.     losartan -hydrochlorothiazide  (HYZAAR) 50-12.5 MG tablet Take 1 tablet by mouth daily. 90 tablet 3   meloxicam  (MOBIC ) 15 MG tablet Take 1 tablet (15 mg  total) by mouth daily. 30 tablet 0   metFORMIN (GLUCOPHAGE) 500 MG tablet Take 500 mg by mouth 2 (two) times daily.     metoprolol  succinate (TOPROL -XL) 50 MG 24 hr tablet TAKE 1 TABLET TWICE A DAY  WITH OR IMMEDIATELY        FOLLOWING A MEAL. 180 tablet 2   Multiple Vitamin (MULITIVITAMIN WITH MINERALS) TABS Take 1 tablet by mouth every morning.     NIFEdipine  (PROCARDIA  XL/NIFEDICAL-XL) 90 MG 24 hr tablet Take 1 tablet (90 mg total) by mouth daily. 90 tablet 2   nitroGLYCERIN  (NITROSTAT ) 0.4 MG SL tablet Place 1 tablet (0.4 mg total) under the tongue every 5 (five) minutes x 3 doses as needed for chest pain. 25 tablet 3   pantoprazole  (PROTONIX ) 40 MG tablet Take 1 tablet (40 mg total) by mouth daily. Please schedule an appointment for further refills. 90 tablet 2   pioglitazone (ACTOS) 30 MG tablet Take 30 mg by mouth daily.     pravastatin  (PRAVACHOL ) 20 MG tablet Take 1 tablet (20 mg total) by mouth daily. 90 tablet 2   predniSONE  (DELTASONE ) 10 MG tablet Take 10 mg by mouth. As needed for gout flare up     sertraline  (ZOLOFT ) 50 MG tablet Take 1 tablet (50 mg total) by mouth daily. 90 tablet 2   sulfaSALAzine (AZULFIDINE) 500 MG tablet Take 500 mg by mouth daily.     tamoxifen  (NOLVADEX ) 20 MG tablet TAKE 1 TABLET DAILY 90 tablet 1   Current Facility-Administered Medications  Medication Dose Route Frequency Provider Last Rate Last Admin   betamethasone  acetate-betamethasone  sodium phosphate  (CELESTONE ) injection 3 mg  3 mg Intramuscular Once Janit Thresa HERO, DPM         Musculoskeletal: Strength & Muscle Tone: within normal limits Gait & Station: normal Patient leans: N/A  Psychiatric Specialty Exam: Review of Systems  Musculoskeletal:  Positive for arthralgias.  All other systems reviewed and are negative.   There were no vitals taken for this visit.There is no height or weight on file to calculate BMI.  General Appearance: Casual and Fairly Groomed  Eye Contact:  Good  Speech:   Clear and Coherent  Volume:  Normal  Mood:  Euthymic  Affect:  Congruent  Thought Process:  Goal Directed  Orientation:  Full (Time, Place, and Person)  Thought Content: WDL   Suicidal Thoughts:  No  Homicidal Thoughts:  No  Memory:  Immediate;   Good Recent;   Good Remote;   Good  Judgement:  Good  Insight:  Good  Psychomotor Activity:  Normal  Concentration:  Concentration: Good and Attention Span: Good  Recall:  Good  Fund of Knowledge: Good  Language: Good  Akathisia:  No  Handed:  Right  AIMS (if indicated): not done  Assets:  Communication Skills Desire for Improvement Resilience Social Support Talents/Skills Vocational/Educational  ADL's:  Intact  Cognition: WNL  Sleep:  Good   Screenings: GAD-7    Flowsheet Row Office Visit from 07/11/2022 in [facility] [place]  Total GAD-7 Score 3   PHQ2-9    Flowsheet Row Office Visit from 07/11/2022 in [facility] at [facility] Video Visit from 06/07/2022 in [facility] at [place] Video Visit from 12/07/2021 in [facility] at [place] Video Visit from 09/07/2021 in [facility] at [place] Video Visit from 08/10/2021 in [facility] at [facility] Total Score 1 0 0 0 1  PHQ-9 Total Score 7 -- -- -- --   Flowsheet Row Office Visit from 07/11/2022 in [facility] at [place] Video Visit from 06/07/2022 in [facility] at [place] Video Visit from 12/07/2021 in [facility] at [place]  C-SSRS RISK CATEGORY No Risk No Risk No Risk     Assessment and Plan: This patient is a 63 year old female with a history of major depression and generalized anxiety disorder.  She continues to do well on her current regimen.  She will continue Zoloft  50 mg daily for  depressive symptoms and anxiety and Xanax  1 mg twice daily for generalized anxiety.  She will return to see me in 6 months  Collaboration of Care: Collaboration of Care: Primary Care Provider AEB notes will be shared with PCP at patient's request  Patient/Guardian was advised Release of Information must be obtained prior to any record release in order to collaborate their care with an outside provider. Patient/Guardian was advised if they have not already done so to contact the registration department to sign all necessary forms in order for us  to release information regarding their care.   Consent: Patient/Guardian gives verbal consent for treatment and assignment of benefits for services provided during this visit. Patient/Guardian expressed understanding and agreed to proceed.    Barnie Gull, MD 11/17/2024, 9:16 AM

## 2024-11-18 LAB — GENECONNECT MOLECULAR SCREEN: Genetic Analysis Overall Interpretation: NEGATIVE

## 2024-11-24 ENCOUNTER — Other Ambulatory Visit: Payer: Self-pay

## 2024-11-24 DIAGNOSIS — Z853 Personal history of malignant neoplasm of breast: Secondary | ICD-10-CM | POA: Insufficient documentation

## 2024-11-24 DIAGNOSIS — Z170 Estrogen receptor positive status [ER+]: Secondary | ICD-10-CM

## 2024-11-24 NOTE — Assessment & Plan Note (Signed)
 Stage IA, pT1cN0M0, ER+/PP+, HER2-, Grade 1 -Diagnosed in 08/2019. Given prior right lumpectomy and RT she underwent right mastectomy on 09/07/19. She has a low risk RS of 17 and adjuvant chemotherapy was not recommended. -Node-negative, postmastectomy radiation was not required. She underwent right breast reconstruction on 12/31/19 with wound complications afterward and it was removed.  -She started anastrozole  before surgery on 06/2019, then resumed 10/2019.

## 2024-11-24 NOTE — Assessment & Plan Note (Signed)
 Invasive ductal carcinoma, pT1cN0M0 stage IA, ER-/PR-, HER2 +, Grade 3, Genetic testing was negative for pathogenetic mutations.  -Diagnosed on 10/16/02 managed by Dr. Maude Rao  -Treated with right lumpectomy and SNLB, Adjuvant chemo AC-Docetaxol/Herceptin and RT

## 2024-11-25 ENCOUNTER — Ambulatory Visit: Admitting: Hematology

## 2024-11-25 ENCOUNTER — Inpatient Hospital Stay: Attending: Hematology

## 2024-11-25 ENCOUNTER — Ambulatory Visit

## 2024-11-25 ENCOUNTER — Encounter: Payer: Self-pay | Admitting: Nurse Practitioner

## 2024-11-25 VITALS — BP 117/57 | HR 97 | Temp 97.5°F | Resp 17 | Wt 177.0 lb

## 2024-11-25 DIAGNOSIS — Z853 Personal history of malignant neoplasm of breast: Secondary | ICD-10-CM

## 2024-11-25 DIAGNOSIS — C50411 Malignant neoplasm of upper-outer quadrant of right female breast: Secondary | ICD-10-CM

## 2024-11-25 DIAGNOSIS — M810 Age-related osteoporosis without current pathological fracture: Secondary | ICD-10-CM

## 2024-11-25 DIAGNOSIS — Z170 Estrogen receptor positive status [ER+]: Secondary | ICD-10-CM

## 2024-11-25 DIAGNOSIS — Z17411 Hormone receptor positive with human epidermal growth factor receptor 2 negative status: Secondary | ICD-10-CM | POA: Diagnosis not present

## 2024-11-25 DIAGNOSIS — Z79810 Long term (current) use of selective estrogen receptor modulators (SERMs): Secondary | ICD-10-CM | POA: Insufficient documentation

## 2024-11-25 LAB — CBC WITH DIFFERENTIAL (CANCER CENTER ONLY)
Abs Immature Granulocytes: 0.01 K/uL (ref 0.00–0.07)
Basophils Absolute: 0.1 K/uL (ref 0.0–0.1)
Basophils Relative: 1 %
Eosinophils Absolute: 0.3 K/uL (ref 0.0–0.5)
Eosinophils Relative: 4 %
HCT: 37 % (ref 36.0–46.0)
Hemoglobin: 12.7 g/dL (ref 12.0–15.0)
Immature Granulocytes: 0 %
Lymphocytes Relative: 48 %
Lymphs Abs: 3 K/uL (ref 0.7–4.0)
MCH: 30.3 pg (ref 26.0–34.0)
MCHC: 34.3 g/dL (ref 30.0–36.0)
MCV: 88.3 fL (ref 80.0–100.0)
Monocytes Absolute: 0.6 K/uL (ref 0.1–1.0)
Monocytes Relative: 10 %
Neutro Abs: 2.3 K/uL (ref 1.7–7.7)
Neutrophils Relative %: 37 %
Platelet Count: 273 K/uL (ref 150–400)
RBC: 4.19 MIL/uL (ref 3.87–5.11)
RDW: 14 % (ref 11.5–15.5)
WBC Count: 6.3 K/uL (ref 4.0–10.5)
nRBC: 0 % (ref 0.0–0.2)

## 2024-11-25 LAB — CMP (CANCER CENTER ONLY)
ALT: 17 U/L (ref 0–44)
AST: 35 U/L (ref 15–41)
Albumin: 4 g/dL (ref 3.5–5.0)
Alkaline Phosphatase: 68 U/L (ref 38–126)
Anion gap: 11 (ref 5–15)
BUN: 11 mg/dL (ref 8–23)
CO2: 25 mmol/L (ref 22–32)
Calcium: 9.3 mg/dL (ref 8.9–10.3)
Chloride: 102 mmol/L (ref 98–111)
Creatinine: 0.72 mg/dL (ref 0.44–1.00)
GFR, Estimated: >60 mL/min (ref >60)
Glucose, Bld: 117 mg/dL — ABNORMAL HIGH (ref 70–99)
Potassium: 4 mmol/L (ref 3.5–5.1)
Sodium: 139 mmol/L (ref 135–145)
Total Bilirubin: 0.3 mg/dL (ref 0.0–1.2)
Total Protein: 7.1 g/dL (ref 6.5–8.1)

## 2024-11-25 NOTE — Progress Notes (Signed)
 [facility]   [phone] [phone]) [phone]   Clinic Follow up Note   Patient Care Team: Pa, [facility] as PCP - General Debera, Jayson MATSU, MD as PCP - Cardiology (Cardiology) Jerilynn Lamarr HERO, NP as Nurse Practitioner (Nurse Practitioner) Tyree Nanetta SAILOR, RN as Oncology Nurse Navigator Lanny Callander, MD as Consulting Physician (Hematology) Curvin Deward MOULD, MD as Consulting Physician (General Surgery) Shannon Agent, MD as Consulting Physician (Radiation Oncology) Curt Lighter, MD as Consulting Physician (Rheumatology) Burton, Lacie K, NP as Nurse Practitioner (Nurse Practitioner)  Date of Service:  11/25/2024  CHIEF COMPLAINT: f/u of right breast cancer  CURRENT THERAPY:  Adjuvant tamoxifen  20 mg daily  Oncology History   Malignant neoplasm of upper-outer quadrant of right breast in female, estrogen receptor positive (HCC) Stage IA, pT1cN0M0, ER+/PP+, HER2-, Grade 1 -Diagnosed in 08/2019. Given prior right lumpectomy and RT she underwent right mastectomy on 09/07/19. She has a low risk RS of 17 and adjuvant chemotherapy was not recommended. -Node-negative, postmastectomy radiation was not required. She underwent right breast reconstruction on 12/31/19 with wound complications afterward and it was removed.  -She started anastrozole  before surgery on 06/2019, then resumed 10/2019.   History of right breast cancer Invasive ductal carcinoma, pT1cN0M0 stage IA, ER-/PR-, HER2 +, Grade 3, Genetic testing was negative for pathogenetic mutations.  -Diagnosed on 10/16/02 managed by Dr. Maude Rao  -Treated with right lumpectomy and SNLB, Adjuvant chemo AC-Docetaxol/Herceptin and RT  Assessment & Plan Estrogen receptor positive right breast cancer, status post mastectomy, on adjuvant tamoxifen  More than five years post early-stage (T1C, stage I, Oncotype 17) ER+ right breast cancer, status post right mastectomy, currently on adjuvant tamoxifen .  She tolerates tamoxifen  well without significant side effects and has no evidence of recurrence on examination. Dense breast tissue increases the need for sensitive surveillance imaging. Prior anastrozole  was discontinued due to arthralgias and mood changes. Hysterectomy in 1999 eliminates endometrial cancer risk from tamoxifen . Ongoing risk includes a small increased risk of thromboembolism with tamoxifen . - Performed unremarkable clinical breast exam. - Ordered contrast-enhanced mammogram of the left breast for March 2026 to improve sensitivity in dense breast tissue, with scheduling through the breast center. - Recommended continuation of tamoxifen  for a total of ten years due to good tolerance and bone health benefits. - Deferred breast cancer index testing as she will continue tamoxifen  for ten years. - Recommended annual follow-up for surveillance. - Encouraged regular physical activity to reduce thromboembolic risk.  Osteoporosis Osteoporosis with T-score of -3.0. She completed three bone-strengthening infusions but declined further infusions due to dental complications. Tamoxifen  provides some bone protection. Activity is limited by rheumatoid arthritis. Last DEXA was in December 2023; repeat imaging is due. - Ordered DEXA scan at [facility] radiology, coordinated with mammogram. - Reinforced calcium and vitamin D  supplementation and weight-bearing exercise for bone health. - Reviewed that tamoxifen  provides mild bone density benefit and will continue as part of her regimen.  Type 2 diabetes mellitus Type 2 diabetes is well controlled on metformin and semaglutide, with recent A1c of 5.5%. Regular exercise is important for glycemic control, bone health, and to reduce thromboembolic risk associated with tamoxifen . - Encouraged regular exercise as tolerated, considering comorbidities, to support glycemic control and overall health.  Plan - She is clinically well, exam was unremarkable, no  clinical concern for recurrence - She is agreeable to continue tamoxifen  for total of 10 years, until November 2030 - Contrast-enhanced left mammogram at breast center in March 2026,  bone density scan [facility] in March 2026 - Follow-up in 1 year with NP    SUMMARY OF ONCOLOGIC HISTORY: Oncology History  Malignant neoplasm of upper-outer quadrant of right breast in female, estrogen receptor positive (HCC)  07/01/2019 Mammogram   Diagnostic mammogram 07/01/19  IMPRESSION: Suspicious mass in the 9:30 region of the right breast 9 cm from the nipple measuring 1.3 x 1.3 x 1.1 cm. Suspicious mass in the right axilla measuring 5 x 4 x 4 mm.   07/02/2019 Cancer Staging   Staging form: Breast, AJCC 8th Edition - Clinical stage from 07/02/2019: Stage IA (cT1c, cN0, cM0, G2, ER+, PR+, HER2: Equivocal) - Signed by Lanny Callander, MD on 07/07/2019   07/02/2019 Initial Biopsy   Diagnosis 07/02/19 1. Breast, right, needle core biopsy, 9:30 o'clock - INVASIVE DUCTAL CARCINOMA, GRADE 1-2. SEE NOTE - DUCTAL CARCINOMA IN SITU, INTERMEDIATE GRADE 2. Lymph node, needle/core biopsy, right axilla - LYMPH NODE, NEGATIVE FOR CARCINOMA   07/02/2019 Receptors her2   By immunohistochemistry, the tumor cells are EQUIVOCAL for Her2 (2+). HER2 by FISH will be PERFORMED and the RESULTS REPORTED SEPARATELY Estrogen Receptor: 100%, POSITIVE, STRONG STAINING INTENSITY Progesterone Receptor: 90%, POSITIVE, STRONG STAINING INTENSITY Proliferation Marker Ki67: 10%   07/06/2019 Initial Diagnosis   Malignant neoplasm of upper-outer quadrant of right breast in female, estrogen receptor positive (HCC)   07/08/2019 -  Anti-estrogen oral therapy   Anastrozole  1mg  once daily starting 07/08/19 before surgery.    07/20/2019 Genetic Testing   Negative genetic testing on the [employer] Multi-Cancer Panel. The report date is 07/20/2019.  The Multi-Gene Panel offered by [facility] includes sequencing and/or deletion duplication  testing of the following 85 genes: AIP, ALK, APC, ATM, AXIN2,BAP1,  BARD1, BLM, BMPR1A, BRCA1, BRCA2, BRIP1, CASR, CDC73, CDH1, CDK4, CDKN1B, CDKN1C, CDKN2A (p14ARF), CDKN2A (p16INK4a), CEBPA, CHEK2, CTNNA1, DICER1, DIS3L2, EGFR (c.2369C>T, p.Thr790Met variant only), EPCAM (Deletion/duplication testing only), FH, FLCN, GATA2, GPC3, GREM1 (Promoter region deletion/duplication testing only), HOXB13 (c.251G>A, p.Gly84Glu), HRAS, KIT, MAX, MEN1, MET, MITF (c.952G>A, p.Glu318Lys variant only), MLH1, MSH2, MSH3, MSH6, MUTYH, NBN, NF1, NF2, NTHL1, PALB2, PDGFRA, PHOX2B, PMS2, POLD1, POLE, POT1, PRKAR1A, PTCH1, PTEN, RAD50, RAD51C, RAD51D, RB1, RECQL4, RET, RNF43, RUNX1, SDHAF2, SDHA (sequence changes only), SDHB, SDHC, SDHD, SMAD4, SMARCA4, SMARCB1, SMARCE1, STK11, SUFU, TERC, TERT, TMEM127, TP53, TSC1, TSC2, VHL, WRN and WT1.     08/29/2019 Surgery   RIGHT MASTECTOMY WITH SENTINEL LYMPH NODE BIOPSY and RECONSTRUCTION WITH PLACEMENT OF TISSUE EXPANDER AND FLEX HD (ACELLULAR HYDRATED DERMIS) by Dr. Curvin and Dr Lowery  09/07/19   09/07/2019 Pathology Results   DIAGNOSIS: 09/07/19  A. SENTINEL LYMPH NODE, RIGHT # 1, BIOPSY:  - One lymph node, negative for carcinoma (0/1).   B. SENTINEL LYMPH NODE, RIGHT # 2, BIOPSY:  - One lymph node, negative for carcinoma (0/1).   C. BREAST, RIGHT, MASTECTOMY:  - Invasive ductal carcinoma, 1.8 cm, Nottingham grade 1 of 3.  - Margins of resection are not involved (Closest margin: 20 mm, deep).  - Ductal carcinoma in situ, focal.  - Biopsy site changes.  - See oncology table.    09/07/2019 Oncotype testing   Oncotype RS: 17, meaning there is 5% distant recurrence risk at 9 years with AI/tamoxifen  alone. Indicating <1% chemo benefit    10/28/2019 Cancer Staging   Staging form: Breast, AJCC 8th Edition - Pathologic: Stage IA (pT1c, pN0, cM0, G1, ER+, PR+, HER2-, Oncotype DX score: 17) - Signed by Lanny Callander, MD on 10/28/2019   12/31/2019  Pathology Results   FINAL  MICROSCOPIC DIAGNOSIS:  A. SKIN, RIGHT BREAST MASTECTOMY FLAP AND SCAR:  - Skin and subcutaneous tissue with fibrosis, mild inflammation and  focal giant cell reaction consistent with scar.  - No evidence of malignancy.    02/24/2020 Survivorship   SCP delivered by Lacie Burton, NP       Discussed the use of AI scribe software for clinical note transcription with the patient, who gave verbal consent to proceed.  History of Present Illness Rachel Sutton is a 63 year old female with ER-positive right breast cancer, status post mastectomy, presenting for routine oncology surveillance.  She is more than five years post right mastectomy for early-stage ER-positive breast cancer (T1C, stage I, Oncotype 17) and has been on adjuvant tamoxifen  for over five years. She previously tried anastrozole  but changed to tamoxifen  due to joint pain and mood changes and now tolerates tamoxifen  without adverse effects. She denies breast pain, notes persistent numbness at the mastectomy site, performs regular self-exams, and previously detected her own cancers. Her last mammogram was in early 2025, and a contrast-enhanced mammogram is planned in March 2026 for dense breast tissue (density C).  She has osteoporosis with a T-score of -3.0 and completed three bone-strengthening infusions that were stopped because of dental complications. She continues calcium and vitamin D  and attempts weight-bearing exercise, which is limited by rheumatoid arthritis. Her last bone density scan was in December 2023 and repeat imaging is due.  Type 2 diabetes is well controlled on metformin and Ozempic, with a recent A1c of 5.5%. She stays active as tolerated, with exercise limited by rheumatoid arthritis. She reports no new medical issues or symptoms in the past six months.     All other systems were reviewed with the patient and are negative.  MEDICAL HISTORY:  Past Medical History:  Diagnosis Date   Anxiety    Arthritis     Breast cancer (HCC) 02/22/2012   Right T1c, N0   C. difficile colitis    Colitis, collagenous 09/22/2015   Depression    Essential hypertension    Family history of brain cancer    Family history of esophageal cancer    Family history of lung cancer    Family history of lymphoma    Family history of pancreatic cancer    Fuchs' corneal dystrophy    GERD (gastroesophageal reflux disease)    Hypertension    Mitral valve prolapse    Mild mitral regurgitation   Mixed hyperlipidemia    OSA (obstructive sleep apnea)    Peripheral neuropathy    Seizures (HCC)    Only with dose of haldol; no more since that was stopped.    SURGICAL HISTORY: Past Surgical History:  Procedure Laterality Date   ABDOMINAL HYSTERECTOMY  1999   BACK SURGERY     BIOPSY N/A 09/28/2015   Procedure: BIOPSY;  Surgeon: Margo LITTIE Haddock, MD;  Location: [facility];  Service: Endoscopy;  Laterality: N/A;   BIOPSY  04/05/2016   Procedure: BIOPSY;  Surgeon: Margo LITTIE Haddock, MD;  Location: [facility];  Service: Endoscopy;;  colon bx   BREAST LUMPECTOMY  10/30/2002   right   BREAST RECONSTRUCTION WITH PLACEMENT OF TISSUE EXPANDER AND FLEX HD (ACELLULAR HYDRATED DERMIS) Right 09/07/2019   Procedure: RIGHT BREAST RECONSTRUCTION WITH PLACEMENT OF TISSUE EXPANDER AND FLEX HD (ACELLULAR HYDRATED DERMIS);  Surgeon: Lowery Estefana GORMAN, DO;  Location: [facility];  Service: Plastics;  Laterality: Right;   CARDIAC CATHETERIZATION  Carpal tunnel R/L  1992   Cervical spine fusion X2 since last visit     COLONOSCOPY WITH PROPOFOL  N/A 09/28/2015   Procedure: COLONOSCOPY WITH PROPOFOL ;  Surgeon: Margo LITTIE Haddock, MD;  Location: [facility];  Service: Endoscopy;  Laterality: N/A;  procedure 1 cecum time in 1232  time out 1246   total time 14 mintes   COLONOSCOPY WITH PROPOFOL  N/A 04/05/2016   Procedure: COLONOSCOPY WITH PROPOFOL ;  Surgeon: Margo LITTIE Haddock, MD;  Location: [facility];  Service: Endoscopy;  Laterality: N/A;  1200   CORNEAL  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   DEBRIDEMENT AND CLOSURE WOUND Right 02/17/2020   Procedure: Excision of right breast wound with closure and provena placement;  Surgeon: Lowery Estefana RAMAN, DO;  Location: [facility];  Service: Plastics;  Laterality: Right;   ELBOW SURGERY Right  tennis elbow release 2012   ESOPHAGOGASTRODUODENOSCOPY  10/26/2012   Procedure: ESOPHAGOGASTRODUODENOSCOPY (EGD);  Surgeon: Lamar JONETTA Aho, MD;  Location[facility];  Service: Endoscopy;  Laterality: N/A;   ESOPHAGOGASTRODUODENOSCOPY (EGD) WITH PROPOFOL  N/A 09/28/2015   Procedure: ESOPHAGOGASTRODUODENOSCOPY (EGD) WITH PROPOFOL ;  Surgeon: Margo LITTIE Haddock, MD;  Location: [facility];  Service: Endoscopy;  Laterality: N/A;   EYE SURGERY  2018   corneal transplants bilateral   KNEE ARTHROSCOPY Left    LEFT HEART CATHETERIZATION WITH CORONARY ANGIOGRAM N/A 10/23/2012   Procedure: LEFT HEART CATHETERIZATION WITH CORONARY ANGIOGRAM;  Surgeon: Peter M Jordan, MD;  Location: [facility];  Service: Cardiovascular;  Laterality: N/A;   MASTECTOMY Right    MASTECTOMY W/ SENTINEL NODE BIOPSY Right 09/07/2019   Procedure: RIGHT MASTECTOMY WITH SENTINEL LYMPH NODE BIOPSY;  Surgeon: Curvin Deward MOULD, MD;  Location: [facility];  Service: General;  Laterality: Right;   OVARIAN CYST REMOVAL     prior to hysterectomy, right   Ray cage fusion  1997   REMOVAL OF TISSUE EXPANDER Right 12/31/2019   Procedure: REMOVAL OF TISSUE EXPANDER AND EXCESS SKIN;  Surgeon: Lowery Estefana RAMAN, DO;  Location: [facility];  Service: Plastics;  Laterality: Right;  1 hour   SENTINEL LYMPH NODE BIOPSY  10/30/2002   right   TONSILLECTOMY     at six months old    I have reviewed the social history and family history with the patient and they are unchanged from previous note.  ALLERGIES:  is allergic to bee venom, haloperidol lactate, meperidine hcl, and xifaxan  [rifaximin ].  MEDICATIONS:  Current Outpatient Medications  Medication  Sig Dispense Refill   acetaminophen  (TYLENOL ) 500 MG tablet Take 1,000 mg by mouth every 6 (six) hours as needed for moderate pain or headache.     ALPRAZolam  (XANAX ) 1 MG tablet Take 1 tablet (1 mg total) by mouth 2 (two) times daily. 180 tablet 2   Calcium-Magnesium-Vitamin D  (CALCIUM 500 PO) Take 1 tablet by mouth daily.      Dexlansoprazole 30 MG capsule DR Take 1 capsule by mouth daily.     diclofenac  sodium (VOLTAREN ) 1 % GEL Apply 1 application topically 3 (three) times daily as needed (pain).      EPINEPHrine  (EPI-PEN) 0.3 mg/0.3 mL DEVI Inject 0.3 mg into the muscle as needed (bee stings).      fish oil-omega-3 fatty acids 1000 MG capsule Take 1 g by mouth every morning.      gabapentin  (NEURONTIN ) 300 MG capsule Take 300 mg by mouth 3 (three) times daily.     hydroxychloroquine  (PLAQUENIL ) 200 MG tablet Take  2 tablets (400 mg total) by mouth daily. 180 tablet 2   leflunomide  (ARAVA ) 20 MG tablet Take 20 mg by mouth daily.     losartan -hydrochlorothiazide  (HYZAAR) 50-12.5 MG tablet Take 1 tablet by mouth daily. 90 tablet 3   meloxicam  (MOBIC ) 15 MG tablet Take 1 tablet (15 mg total) by mouth daily. 30 tablet 0   metFORMIN (GLUCOPHAGE) 500 MG tablet Take 500 mg by mouth 2 (two) times daily.     metoprolol  succinate (TOPROL -XL) 50 MG 24 hr tablet TAKE 1 TABLET TWICE A DAY  WITH OR IMMEDIATELY        FOLLOWING A MEAL. 180 tablet 2   Multiple Vitamin (MULITIVITAMIN WITH MINERALS) TABS Take 1 tablet by mouth every morning.     NIFEdipine  (PROCARDIA  XL/NIFEDICAL-XL) 90 MG 24 hr tablet Take 1 tablet (90 mg total) by mouth daily. 90 tablet 2   nitroGLYCERIN  (NITROSTAT ) 0.4 MG SL tablet Place 1 tablet (0.4 mg total) under the tongue every 5 (five) minutes x 3 doses as needed for chest pain. 25 tablet 3   OZEMPIC, 0.25 OR 0.5 MG/DOSE, 2 MG/3ML SOPN Inject 0.5 mg into the skin once a week.     pantoprazole  (PROTONIX ) 40 MG tablet Take 1 tablet (40 mg total) by mouth daily. Please schedule an  appointment for further refills. 90 tablet 2   pioglitazone (ACTOS) 30 MG tablet Take 30 mg by mouth daily.     pravastatin  (PRAVACHOL ) 20 MG tablet Take 1 tablet (20 mg total) by mouth daily. 90 tablet 2   predniSONE  (DELTASONE ) 10 MG tablet Take 10 mg by mouth. As needed for gout flare up     sertraline  (ZOLOFT ) 50 MG tablet Take 1 tablet (50 mg total) by mouth daily. 90 tablet 2   sulfaSALAzine (AZULFIDINE) 500 MG tablet Take 500 mg by mouth daily.     tamoxifen  (NOLVADEX ) 20 MG tablet TAKE 1 TABLET DAILY 90 tablet 1   Current Facility-Administered Medications  Medication Dose Route Frequency Provider Last Rate Last Admin   betamethasone  acetate-betamethasone  sodium phosphate  (CELESTONE ) injection 3 mg  3 mg Intramuscular Once Janit Thresa HERO, DPM        PHYSICAL EXAMINATION: ECOG PERFORMANCE STATUS: 0 - Asymptomatic  Vitals:   11/25/24 0851  BP: (!) 117/57  Pulse: 97  Resp: 17  Temp: (!) 97.5 F (36.4 C)  SpO2: 97%   Wt Readings from Last 3 Encounters:  11/25/24 177 lb (80.3 kg)  08/06/24 194 lb (88 kg)  05/26/24 186 lb 14.4 oz (84.8 kg)     GENERAL:alert, no distress and comfortable SKIN: skin color, texture, turgor are normal, no rashes or significant lesions EYES: normal, Conjunctiva are pink and non-injected, sclera clear NECK: supple, thyroid normal size, non-tender, without nodularity LYMPH:  no palpable lymphadenopathy in the cervical, axillary  LUNGS: clear to auscultation and percussion with normal breathing effort HEART: regular rate & rhythm and no murmurs and no lower extremity edema ABDOMEN:abdomen soft, non-tender and normal bowel sounds Musculoskeletal:no cyanosis of digits and no clubbing  NEURO: alert & oriented x 3 with fluent speech, no focal motor/sensory deficits BREAST: Status post right mastectomy, no palpable nodule or skin change on the right chest wall, left breast exam was unremarkable.  No palpable axillary adenopathy. Physical  Exam   LABORATORY DATA:  I have reviewed the data as listed    Latest Ref Rng & Units 11/25/2024    8:36 AM 05/26/2024    8:12 AM 11/19/2023   10:23  AM  CBC  WBC 4.0 - 10.5 K/uL 6.3  7.0  7.4   Hemoglobin 12.0 - 15.0 g/dL 87.2  86.5  87.3   Hematocrit 36.0 - 46.0 % 37.0  38.3  37.1   Platelets 150 - 400 K/uL 273  281  274         Latest Ref Rng & Units 11/25/2024    8:36 AM 05/26/2024    8:12 AM 11/19/2023   10:23 AM  CMP  Glucose 70 - 99 mg/dL 882  825  843   BUN 8 - 23 mg/dL 11  15  14    Creatinine 0.44 - 1.00 mg/dL 9.27  9.29  9.29   Sodium 135 - 145 mmol/L 139  138  140   Potassium 3.5 - 5.1 mmol/L 4.0  4.1  3.7   Chloride 98 - 111 mmol/L 102  103  106   CO2 22 - 32 mmol/L 25  24  25    Calcium 8.9 - 10.3 mg/dL 9.3  9.6  9.1   Total Protein 6.5 - 8.1 g/dL 7.1  7.5  6.7   Total Bilirubin 0.0 - 1.2 mg/dL 0.3  0.6  0.4   Alkaline Phos 38 - 126 U/L 68  67  58   AST 15 - 41 U/L 35  32  22   ALT 0 - 44 U/L 17  22  16        RADIOGRAPHIC STUDIES: I have personally reviewed the radiological images as listed and agreed with the findings in the report. No results found.    Orders Placed This Encounter  Procedures   DG Bone Density    Standing Status:   Future    Expected Date:   03/05/2025    Expiration Date:   11/25/2025    Reason for Exam (SYMPTOM  OR DIAGNOSIS REQUIRED):   screening    Preferred imaging location?:   [facility]   All questions were answered. The patient knows to call the clinic with any problems, questions or concerns. No barriers to learning was detected. The total time spent in the appointment was 30 minutes, including review of chart and various tests results, discussions about plan of care and coordination of care plan     Onita Mattock, MD 11/25/2024

## 2024-12-19 ENCOUNTER — Other Ambulatory Visit: Payer: Self-pay | Admitting: Cardiology

## 2025-01-04 ENCOUNTER — Other Ambulatory Visit: Payer: Self-pay | Admitting: Cardiology

## 2025-01-05 ENCOUNTER — Other Ambulatory Visit: Payer: Self-pay

## 2025-01-05 DIAGNOSIS — C50411 Malignant neoplasm of upper-outer quadrant of right female breast: Secondary | ICD-10-CM

## 2025-01-05 NOTE — Progress Notes (Signed)
 Verbal order w/readback from Dr Lanny for CEM of lft breast to be done on 03/05/2025 at [facility].  Order placed.

## 2025-05-18 ENCOUNTER — Telehealth (HOSPITAL_COMMUNITY): Admitting: Psychiatry

## 2025-11-30 ENCOUNTER — Inpatient Hospital Stay: Admitting: Nurse Practitioner

## 2025-11-30 ENCOUNTER — Inpatient Hospital Stay
